# Patient Record
Sex: Female | Born: 1950
Health system: Southern US, Community
[De-identification: ages and names within clinical notes are randomized; demographics above are authoritative.]

## PROBLEM LIST (undated history)

## (undated) ENCOUNTER — Encounter

## (undated) ENCOUNTER — Ambulatory Visit: Payer: MEDICARE

## (undated) ENCOUNTER — Telehealth

## (undated) ENCOUNTER — Ambulatory Visit

## (undated) ENCOUNTER — Encounter: Attending: Hematology & Oncology | Primary: Hematology & Oncology

## (undated) ENCOUNTER — Encounter: Attending: Adult Health | Primary: Adult Health

## (undated) ENCOUNTER — Telehealth: Attending: Hematology & Oncology | Primary: Hematology & Oncology

## (undated) ENCOUNTER — Ambulatory Visit: Payer: MEDICARE | Attending: Nurse Practitioner | Primary: Nurse Practitioner

## (undated) ENCOUNTER — Encounter
Attending: Student in an Organized Health Care Education/Training Program | Primary: Student in an Organized Health Care Education/Training Program

## (undated) ENCOUNTER — Encounter: Attending: Otolaryngology | Primary: Otolaryngology

## (undated) ENCOUNTER — Encounter: Attending: Critical Care Medicine | Primary: Critical Care Medicine

## (undated) ENCOUNTER — Encounter: Attending: Oncology | Primary: Oncology

## (undated) ENCOUNTER — Telehealth: Attending: Registered" | Primary: Registered"

## (undated) ENCOUNTER — Telehealth: Attending: Oncology | Primary: Oncology

## (undated) ENCOUNTER — Ambulatory Visit: Payer: MEDICARE | Attending: Surgery | Primary: Surgery

## (undated) ENCOUNTER — Telehealth: Attending: Internal Medicine | Primary: Internal Medicine

## (undated) ENCOUNTER — Encounter: Attending: Pulmonary Disease | Primary: Pulmonary Disease

## (undated) ENCOUNTER — Telehealth: Attending: Surgery | Primary: Surgery

## (undated) ENCOUNTER — Ambulatory Visit: Payer: MEDICARE | Attending: Adult Health | Primary: Adult Health

## (undated) ENCOUNTER — Ambulatory Visit: Payer: MEDICARE | Attending: Hematology & Oncology | Primary: Hematology & Oncology

## (undated) ENCOUNTER — Encounter: Attending: Nurse Practitioner | Primary: Nurse Practitioner

## (undated) ENCOUNTER — Encounter: Attending: Radiation Oncology | Primary: Radiation Oncology

## (undated) ENCOUNTER — Telehealth: Attending: Nurse Practitioner | Primary: Nurse Practitioner

## (undated) ENCOUNTER — Inpatient Hospital Stay

## (undated) ENCOUNTER — Encounter: Attending: Internal Medicine | Primary: Internal Medicine

## (undated) ENCOUNTER — Telehealth
Attending: Pharmacist Clinician (PhC)/ Clinical Pharmacy Specialist | Primary: Pharmacist Clinician (PhC)/ Clinical Pharmacy Specialist

## (undated) ENCOUNTER — Telehealth: Attending: Adult Health | Primary: Adult Health

## (undated) ENCOUNTER — Institutional Professional Consult (permissible substitution): Payer: MEDICARE

## (undated) ENCOUNTER — Ambulatory Visit: Payer: MEDICARE | Attending: Otolaryngology | Primary: Otolaryngology

## (undated) ENCOUNTER — Ambulatory Visit: Attending: Internal Medicine | Primary: Internal Medicine

## (undated) ENCOUNTER — Ambulatory Visit: Attending: Nurse Practitioner | Primary: Nurse Practitioner

## (undated) DIAGNOSIS — F329 Major depressive disorder, single episode, unspecified: Secondary | ICD-10-CM

## (undated) DIAGNOSIS — G5600 Carpal tunnel syndrome, unspecified upper limb: Secondary | ICD-10-CM

## (undated) DIAGNOSIS — T7840XA Allergy, unspecified, initial encounter: Secondary | ICD-10-CM

## (undated) DIAGNOSIS — F32A Depression, unspecified: Secondary | ICD-10-CM

## (undated) DIAGNOSIS — I10 Essential (primary) hypertension: Secondary | ICD-10-CM

## (undated) DIAGNOSIS — B009 Herpesviral infection, unspecified: Secondary | ICD-10-CM

## (undated) DIAGNOSIS — M1990 Unspecified osteoarthritis, unspecified site: Secondary | ICD-10-CM

## (undated) HISTORY — PX: TONSILLECTOMY: SUR1361

## (undated) HISTORY — DX: Essential (primary) hypertension: I10

## (undated) HISTORY — DX: Depression, unspecified: F32.A

## (undated) HISTORY — DX: Major depressive disorder, single episode, unspecified: F32.9

## (undated) HISTORY — DX: Carpal tunnel syndrome, unspecified upper limb: G56.00

## (undated) HISTORY — DX: Herpesviral infection, unspecified: B00.9

## (undated) HISTORY — DX: Allergy, unspecified, initial encounter: T78.40XA

## (undated) HISTORY — DX: Unspecified osteoarthritis, unspecified site: M19.90

## (undated) MED ORDER — GINSENG ORAL: Freq: Two times a day (BID) | ORAL | 0 days

## (undated) MED ORDER — CYANOCOBALAMIN (VIT B-12) 100 MCG TABLET: Freq: Every day | ORAL | 0 days

## (undated) MED ORDER — LORATADINE 10 MG TABLET: Freq: Every day | ORAL | 0.00000 days

## (undated) MED ORDER — FOLIC ACID 400 MCG TABLET: Freq: Every day | ORAL | 0.00000 days

## (undated) MED ORDER — AMERICAN GINSENG ROOT (BULK) POWDER
ORAL | 0 days
Start: ? — End: 2020-12-16

---
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
## 1990-10-01 HISTORY — PX: TUBAL LIGATION: SHX77

## 1997-10-01 HISTORY — PX: LAPAROSCOPY: SHX197

## 2004-11-17 ENCOUNTER — Ambulatory Visit: Payer: Self-pay | Admitting: Internal Medicine

## 2004-12-07 ENCOUNTER — Ambulatory Visit: Payer: Self-pay | Admitting: Rheumatology

## 2005-01-17 ENCOUNTER — Ambulatory Visit: Payer: Self-pay | Admitting: Internal Medicine

## 2005-01-17 ENCOUNTER — Ambulatory Visit: Payer: Self-pay | Admitting: Rheumatology

## 2005-03-20 ENCOUNTER — Ambulatory Visit: Payer: Self-pay | Admitting: Rheumatology

## 2005-06-26 ENCOUNTER — Ambulatory Visit: Payer: Self-pay | Admitting: Internal Medicine

## 2005-11-14 ENCOUNTER — Ambulatory Visit: Payer: Self-pay | Admitting: Internal Medicine

## 2007-09-04 ENCOUNTER — Ambulatory Visit: Payer: Self-pay | Admitting: Family Medicine

## 2008-10-27 ENCOUNTER — Ambulatory Visit: Payer: Self-pay | Admitting: Unknown Physician Specialty

## 2009-02-02 LAB — HM PAP SMEAR: HM Pap smear: NORMAL

## 2009-05-04 LAB — HM DEXA SCAN

## 2009-07-11 ENCOUNTER — Ambulatory Visit: Payer: Self-pay | Admitting: Nurse Practitioner

## 2009-07-19 ENCOUNTER — Ambulatory Visit: Payer: Self-pay | Admitting: Nurse Practitioner

## 2009-08-08 ENCOUNTER — Ambulatory Visit: Payer: Self-pay | Admitting: Nurse Practitioner

## 2009-10-03 LAB — HM COLONOSCOPY: HM Colonoscopy: NORMAL

## 2010-01-24 ENCOUNTER — Ambulatory Visit: Payer: Self-pay | Admitting: Nurse Practitioner

## 2010-01-26 ENCOUNTER — Ambulatory Visit: Payer: Self-pay | Admitting: Nurse Practitioner

## 2010-06-05 LAB — HM MAMMOGRAPHY: HM Mammogram: NORMAL

## 2010-07-27 ENCOUNTER — Ambulatory Visit: Payer: Self-pay | Admitting: Nurse Practitioner

## 2010-09-05 ENCOUNTER — Ambulatory Visit: Payer: Self-pay | Admitting: Nurse Practitioner

## 2010-09-05 LAB — BASIC METABOLIC PANEL
Glucose: 86 mg/dL
Potassium: 4.1 mmol/L (ref 3.4–5.3)
Sodium: 141 mmol/L (ref 137–147)

## 2010-09-05 LAB — TSH: TSH: 4.24 u[IU]/mL (ref 0.41–5.90)

## 2010-09-05 LAB — HEPATIC FUNCTION PANEL
ALT: 37 U/L — AB (ref 7–35)
AST: 25 U/L (ref 13–35)

## 2010-10-01 HISTORY — PX: NASAL SINUS SURGERY: SHX719

## 2010-11-29 ENCOUNTER — Ambulatory Visit: Payer: Self-pay

## 2010-12-18 ENCOUNTER — Ambulatory Visit: Payer: Self-pay | Admitting: Family Medicine

## 2011-03-05 ENCOUNTER — Ambulatory Visit: Payer: Self-pay | Admitting: Otolaryngology

## 2011-05-24 ENCOUNTER — Encounter: Payer: Self-pay | Admitting: Internal Medicine

## 2011-06-12 ENCOUNTER — Encounter: Payer: Self-pay | Admitting: Internal Medicine

## 2011-07-18 ENCOUNTER — Encounter: Payer: Self-pay | Admitting: Internal Medicine

## 2011-07-18 ENCOUNTER — Ambulatory Visit (INDEPENDENT_AMBULATORY_CARE_PROVIDER_SITE_OTHER): Payer: PRIVATE HEALTH INSURANCE | Admitting: Internal Medicine

## 2011-07-18 ENCOUNTER — Ambulatory Visit: Payer: Self-pay | Admitting: Internal Medicine

## 2011-07-18 VITALS — BP 140/80 | HR 95 | Temp 98.4°F | Resp 16 | Ht 61.5 in | Wt 199.2 lb

## 2011-07-18 DIAGNOSIS — F32A Depression, unspecified: Secondary | ICD-10-CM | POA: Insufficient documentation

## 2011-07-18 DIAGNOSIS — Z0000 Encounter for general adult medical examination without abnormal findings: Secondary | ICD-10-CM

## 2011-07-18 DIAGNOSIS — J309 Allergic rhinitis, unspecified: Secondary | ICD-10-CM | POA: Insufficient documentation

## 2011-07-18 DIAGNOSIS — R5383 Other fatigue: Secondary | ICD-10-CM | POA: Insufficient documentation

## 2011-07-18 DIAGNOSIS — I10 Essential (primary) hypertension: Secondary | ICD-10-CM

## 2011-07-18 DIAGNOSIS — F329 Major depressive disorder, single episode, unspecified: Secondary | ICD-10-CM

## 2011-07-18 MED ORDER — AMLODIPINE BESYLATE 5 MG PO TABS: 5.0000 mg | ORAL_TABLET | Freq: Every day | ORAL | Status: DC

## 2011-07-18 MED ORDER — TRIAMTERENE-HCTZ 37.5-25 MG PO TABS: 1.0000 | ORAL_TABLET | Freq: Every day | ORAL | Status: DC

## 2011-07-18 MED ORDER — BECLOMETHASONE DIPROPIONATE 80 MCG/ACT NA AERS: 80.0000 ug | INHALATION_SPRAY | Freq: Every day | NASAL | Status: DC

## 2011-07-18 NOTE — Progress Notes (Signed)
Subjective:    Patient ID: Stacy Santana, female    DOB: 08/02/1951, 60 y.o.   MRN: [id number]  HPI Stacy Santana is a 60 year old female with a history of hypertension who presents to establish care. Her primary concern today is fatigue. She notes that she works in a stressful job environment. She reports that in order to complete task she has to consume caffeine because of fatigue. She reports that she sleeps well. She reports limited time for exercise. She reports that her mood has been good on Wellbutrin. She reports some increase in constipation and some increasing cold intolerance. She notes that her thyroid lab work was just above the normal range on last check. Repeat lab work is pending. She also notes that she has been checking her blood pressure and it has been running higher typically 140s over 90s. She denies any chest pain, palpitations.  Outpatient Encounter Prescriptions as of 07/18/2011  Medication Sig Dispense Refill  . azelastine (ASTELIN) 137 MCG/SPRAY nasal spray Place 1 spray into the nose at bedtime as needed. Use in each nostril as directed       . bimatoprost (LUMIGAN) 0.03 % ophthalmic solution Place 1 drop into both eyes at bedtime.        [name] buPROPion (WELLBUTRIN XL) 300 MG 24 hr tablet Take 300 mg by mouth daily.        [name] CALCIUM PO Take 1 capsule by mouth daily.        . Cholecalciferol (VITAMIN D PO) Take 1 tablet by mouth daily.        [name] ibandronate (BONIVA) 150 MG tablet Take 150 mg by mouth every 30 (thirty) days. Take in the morning with a full glass of water, on an empty stomach, and do not take anything else by mouth or lie down for the next 30 min.       [name] loratadine (CLARITIN) 10 MG tablet Take 10 mg by mouth daily.        . Naproxen Sodium 220 MG CAPS Take 2 capsules by mouth daily.        . Omega-3 Fatty Acids (FISH OIL PO) Take 1 capsule by mouth daily.        [name] triamterene-hydrochlorothiazide (MAXZIDE-25) 37.5-25 MG per tablet Take 1 each (1 tablet total) by  mouth daily.  90 tablet  4    Review of Systems  Constitutional: Positive for fatigue. Negative for fever, chills, appetite change and unexpected weight change.  HENT: Negative for ear pain, congestion, sore throat, trouble swallowing, neck pain, voice change and sinus pressure.   Eyes: Negative for visual disturbance.  Respiratory: Negative for cough, shortness of breath, wheezing and stridor.   Cardiovascular: Negative for chest pain, palpitations and leg swelling.  Gastrointestinal: Negative for nausea, vomiting, abdominal pain, diarrhea, constipation, blood in stool, abdominal distention and anal bleeding.  Genitourinary: Negative for dysuria and flank pain.  Musculoskeletal: Negative for myalgias, arthralgias and gait problem.  Skin: Negative for color change and rash.  Neurological: Negative for dizziness and headaches.  Hematological: Negative for adenopathy. Does not bruise/bleed easily.  Psychiatric/Behavioral: Negative for suicidal ideas, sleep disturbance and dysphoric mood. The patient is not nervous/anxious.    BP 140/80  Pulse 95  Temp(Src) 98.4 F (36.9 C) (Oral)  Resp 16  Ht 5' 1.5" (1.562 m)  Wt 199 lb 4 oz (90.379 kg)  BMI 37.04 kg/m2  SpO2 98% BP check by nurse 176/90    Objective:   Physical Exam  Constitutional: She  is oriented to person, place, and time. She appears well-developed and well-nourished. No distress.  HENT:  Head: Normocephalic and atraumatic.  Right Ear: External ear normal.  Left Ear: External ear normal.  Nose: Nose normal.  Mouth/Throat: Oropharynx is clear and moist. No oropharyngeal exudate.  Eyes: Conjunctivae are normal. Pupils are equal, round, and reactive to light. Right eye exhibits no discharge. Left eye exhibits no discharge. No scleral icterus.  Neck: Normal range of motion. Neck supple. No tracheal deviation present. No thyromegaly present.  Cardiovascular: Normal rate, regular rhythm, normal heart sounds and intact distal  pulses.  Exam reveals no gallop and no friction rub.   No murmur heard. Pulmonary/Chest: Effort normal and breath sounds normal. No respiratory distress. She has no wheezes. She has no rales. She exhibits no tenderness.  Musculoskeletal: Normal range of motion. She exhibits no edema and no tenderness.  Lymphadenopathy:    She has no cervical adenopathy.  Neurological: She is alert and oriented to person, place, and time. No cranial nerve deficit. She exhibits normal muscle tone. Coordination normal.  Skin: Skin is warm and dry. No rash noted. She is not diaphoretic. No erythema. No pallor.  Psychiatric: She has a normal mood and affect. Her behavior is normal. Judgment and thought content normal.          Assessment & Plan:  1. Fatigue -suspect this may be related to thyroid dysfunction. Review of her lab work from last year showed a borderline elevated TSH. Repeat TSH is pending and was repeated earlier this month. We will request results on this. Will treat as appropriate. I will call her once results available. We'll also check B12 level. She will followup in one month.  2. Hypertension -blood pressure elevated today. Will add amlodipine. Will check renal function with recent labs. She will return to clinic in one month.  3. Allergic rhinitis -patient reports improvement on new nasal steroid. Refill given today. We'll request records on recent sinus surgery.  4. Depression - well controlled on wellbutrin. Will continue.

## 2011-08-08 ENCOUNTER — Encounter: Payer: Self-pay | Admitting: Internal Medicine

## 2011-08-20 ENCOUNTER — Encounter: Payer: PRIVATE HEALTH INSURANCE | Admitting: Internal Medicine

## 2011-08-21 ENCOUNTER — Ambulatory Visit (INDEPENDENT_AMBULATORY_CARE_PROVIDER_SITE_OTHER): Payer: PRIVATE HEALTH INSURANCE | Admitting: Internal Medicine

## 2011-08-21 ENCOUNTER — Encounter: Payer: Self-pay | Admitting: Internal Medicine

## 2011-08-21 DIAGNOSIS — Z0000 Encounter for general adult medical examination without abnormal findings: Secondary | ICD-10-CM

## 2011-08-21 DIAGNOSIS — E785 Hyperlipidemia, unspecified: Secondary | ICD-10-CM

## 2011-08-21 DIAGNOSIS — J309 Allergic rhinitis, unspecified: Secondary | ICD-10-CM

## 2011-08-21 DIAGNOSIS — M542 Cervicalgia: Secondary | ICD-10-CM

## 2011-08-21 DIAGNOSIS — E039 Hypothyroidism, unspecified: Secondary | ICD-10-CM

## 2011-08-21 MED ORDER — LEVOTHYROXINE SODIUM 50 MCG PO TABS: 50.0000 ug | ORAL_TABLET | Freq: Every day | ORAL | Status: DC

## 2011-08-21 MED ORDER — CICLESONIDE 50 MCG/ACT NA SUSP: 2.0000 | Freq: Every day | NASAL | Status: DC

## 2011-08-21 NOTE — Progress Notes (Signed)
Subjective:    Patient ID: Stacy Santana, female    DOB: Jul 06, 1951, 60 y.o.   MRN: [id number]  HPI 60 year old female with a history of hypertension presents for her annual exam. She continues to complain of fatigue. She continues to report drinking excessive amounts of caffeine to help combat fatigue. We reviewed lab work from hospital screening today which showed elevated TSH at 5.2. Aside from her fatigue, she reports that she has been feeling well. She reports that she has been exercising with her husband riding bikes. She continues to work long hours and has considerable stress at work.  She reports that blood pressure has been improved since starting Norvasc. She did not bring a record of her blood pressures today. She denies any chest pain, palpitations, headache.  She notes some increased muscle cramping and pain in her upper back. She associates this with stress at work. She has taken over-the-counter pain medicines with minimal improvement.  Outpatient Encounter Prescriptions as of 08/21/2011  Medication Sig Dispense Refill  . amLODipine (NORVASC) 5 MG tablet Take 1 tablet (5 mg total) by mouth daily.  90 tablet  4  . azelastine (ASTELIN) 137 MCG/SPRAY nasal spray Place 1 spray into the nose at bedtime as needed. Use in each nostril as directed       . bimatoprost (LUMIGAN) 0.03 % ophthalmic solution Place 1 drop into both eyes at bedtime.        [name] buPROPion (WELLBUTRIN XL) 300 MG 24 hr tablet Take 300 mg by mouth daily.        [name] CALCIUM PO Take 1,200 mg by mouth daily.       . Cholecalciferol (VITAMIN D PO) Take 1 tablet by mouth daily.        [name] ibandronate (BONIVA) 150 MG tablet Take 150 mg by mouth every 30 (thirty) days. Take in the morning with a full glass of water, on an empty stomach, and do not take anything else by mouth or lie down for the next 30 min.       [name] loratadine (CLARITIN) 10 MG tablet Take 10 mg by mouth daily.        . Naproxen Sodium 220 MG CAPS Take 2 capsules  by mouth daily.        . Omega-3 Fatty Acids (FISH OIL PO) Take 1 capsule by mouth daily.        [name] triamterene-hydrochlorothiazide (MAXZIDE-25) 37.5-25 MG per tablet Take 1 each (1 tablet total) by mouth daily.  90 tablet  4  . DISCONTD: Beclomethasone Dipropionate 80 MCG/ACT AERS Place 80 mcg into the nose daily.  1 Inhaler  6  . ciclesonide (OMNARIS) 50 MCG/ACT nasal spray Place 2 sprays into both nostrils daily.  12.5 g  0  . levothyroxine (SYNTHROID) 50 MCG tablet Take 1 tablet (50 mcg total) by mouth daily.  30 tablet  3    Review of Systems  Constitutional: Positive for fatigue. Negative for fever, chills, appetite change and unexpected weight change.  HENT: Negative for ear pain, congestion, sore throat, trouble swallowing, neck pain, voice change and sinus pressure.   Eyes: Negative for visual disturbance.  Respiratory: Negative for cough, shortness of breath, wheezing and stridor.   Cardiovascular: Negative for chest pain, palpitations and leg swelling.  Gastrointestinal: Negative for nausea, vomiting, abdominal pain, diarrhea, constipation, blood in stool, abdominal distention and anal bleeding.  Genitourinary: Negative for dysuria and flank pain.  Musculoskeletal: Positive for myalgias (upper back pain). Negative for arthralgias and  gait problem.  Skin: Negative for color change and rash.  Neurological: Negative for dizziness and headaches.  Hematological: Negative for adenopathy. Does not bruise/bleed easily.  Psychiatric/Behavioral: Negative for suicidal ideas, sleep disturbance and dysphoric mood. The patient is not nervous/anxious.    BP 118/78  Pulse 94  Temp(Src) 98.4 F (36.9 C) (Oral)  Resp 14  Ht 5\' 2"  (1.575 m)  Wt 200 lb 8 oz (90.946 kg)  BMI 36.67 kg/m2  SpO2 98%     Objective:   Physical Exam  Constitutional: She is oriented to person, place, and time. She appears well-developed and well-nourished. No distress.  HENT:  Head: Normocephalic and atraumatic.    Right Ear: External ear normal.  Left Ear: External ear normal.  Nose: Nose normal.  Mouth/Throat: Oropharynx is clear and moist. No oropharyngeal exudate.  Eyes: Conjunctivae are normal. Pupils are equal, round, and reactive to light. Right eye exhibits no discharge. Left eye exhibits no discharge. No scleral icterus.  Neck: Normal range of motion. Neck supple. No tracheal deviation present. No thyromegaly present.  Cardiovascular: Normal rate, regular rhythm, normal heart sounds and intact distal pulses.  Exam reveals no gallop and no friction rub.   No murmur heard. Pulmonary/Chest: Effort normal and breath sounds normal. No respiratory distress. She has no wheezes. She has no rales. She exhibits no tenderness. Right breast exhibits no inverted nipple, no mass, no nipple discharge, no skin change and no tenderness. Left breast exhibits no inverted nipple, no mass, no nipple discharge, no skin change and no tenderness. Breasts are symmetrical.  Abdominal: Soft. Bowel sounds are normal. She exhibits no distension and no mass. There is no tenderness. There is no rebound and no guarding.  Musculoskeletal: Normal range of motion. She exhibits no edema and no tenderness.  Lymphadenopathy:    She has no cervical adenopathy.  Neurological: She is alert and oriented to person, place, and time. No cranial nerve deficit. She exhibits normal muscle tone. Coordination normal.  Skin: Skin is warm and dry. No rash noted. She is not diaphoretic. No erythema. No pallor.  Psychiatric: She has a normal mood and affect. Her behavior is normal. Judgment and thought content normal.          Assessment & Plan:  1. General medical exam - Exam normal. Mammogram ordered. Labs reviewed including kidney function, lipids, thyroid function (from hospital screening). Will repeat lipids, as they were elevated, in 1 month. Encouraged continued efforts at diet and exercise.  PAP deferred this year as reportedly normal  last year. Will request record on this.  2. Hypothyroidism -symptoms including fatigue and constipation and lab values including elevated TSH are consistent with hypothyroidism. Will start Synthroid 50 mcg daily. Patient was instructed to take this medication 30 minutes prior to breakfast with water. She will have a repeat TSH in one month. She will followup in 6 weeks.  3. Cervicalgia - patient with mild upper back pain and cramping of the trapezius muscle. Recommended massage therapy. Prescription given today.  4. Hypertension - BP improved with addition of norvasc. Will continue. Recent renal function normal. Repeat renal function with labs 09/2011.

## 2011-08-22 ENCOUNTER — Ambulatory Visit: Payer: Self-pay | Admitting: Internal Medicine

## 2011-08-28 ENCOUNTER — Encounter: Payer: Self-pay | Admitting: Internal Medicine

## 2011-09-06 ENCOUNTER — Encounter: Payer: Self-pay | Admitting: Internal Medicine

## 2011-09-06 DIAGNOSIS — E7800 Pure hypercholesterolemia, unspecified: Secondary | ICD-10-CM

## 2011-09-20 ENCOUNTER — Other Ambulatory Visit (INDEPENDENT_AMBULATORY_CARE_PROVIDER_SITE_OTHER): Payer: PRIVATE HEALTH INSURANCE | Admitting: *Deleted

## 2011-09-20 DIAGNOSIS — E785 Hyperlipidemia, unspecified: Secondary | ICD-10-CM

## 2011-09-20 DIAGNOSIS — E039 Hypothyroidism, unspecified: Secondary | ICD-10-CM

## 2011-09-20 LAB — COMPREHENSIVE METABOLIC PANEL
ALT: 49 U/L — ABNORMAL HIGH (ref 0–35)
Alkaline Phosphatase: 81 U/L (ref 39–117)
Sodium: 140 mEq/L (ref 135–145)
Total Bilirubin: 0.5 mg/dL (ref 0.3–1.2)
Total Protein: 7.1 g/dL (ref 6.0–8.3)

## 2011-09-20 LAB — LIPID PANEL
HDL: 45.8 mg/dL (ref >39.00)
LDL Cholesterol: 86 mg/dL (ref 0–99)
Total CHOL/HDL Ratio: 4
VLDL: 33.6 mg/dL (ref 0.0–40.0)

## 2011-09-28 ENCOUNTER — Ambulatory Visit: Payer: PRIVATE HEALTH INSURANCE | Admitting: Internal Medicine

## 2011-10-09 ENCOUNTER — Ambulatory Visit: Payer: PRIVATE HEALTH INSURANCE | Admitting: Internal Medicine

## 2011-10-15 ENCOUNTER — Ambulatory Visit (INDEPENDENT_AMBULATORY_CARE_PROVIDER_SITE_OTHER): Payer: PRIVATE HEALTH INSURANCE | Admitting: Internal Medicine

## 2011-10-15 ENCOUNTER — Encounter: Payer: Self-pay | Admitting: Internal Medicine

## 2011-10-15 DIAGNOSIS — E039 Hypothyroidism, unspecified: Secondary | ICD-10-CM

## 2011-10-15 NOTE — Progress Notes (Signed)
Subjective:    Patient ID: Stacy Santana, female    DOB: [birth year], 61 y.o.   MRN: [id number]  HPI 61 year old female with history of hypothyroidism presents for followup. She reports that since starting on thyroid medication her symptoms of fatigue have improved. She reports full compliance with her medication. She denies any hot or cold intolerance, ongoing fatigue, constipation, or other symptoms. She reports that she is feeling well and does not have any other complaints today.  Outpatient Encounter Prescriptions as of 10/15/2011  Medication Sig Dispense Refill  . amLODipine (NORVASC) 5 MG tablet Take 1 tablet (5 mg total) by mouth daily.  90 tablet  4  . azelastine (ASTELIN) 137 MCG/SPRAY nasal spray Place 1 spray into the nose at bedtime as needed. Use in each nostril as directed       . bimatoprost (LUMIGAN) 0.03 % ophthalmic solution Place 1 drop into both eyes at bedtime.        [name] buPROPion (WELLBUTRIN XL) 300 MG 24 hr tablet Take 300 mg by mouth daily.        [name] CALCIUM PO Take 1,200 mg by mouth daily.       . Cholecalciferol (VITAMIN D PO) Take 1 tablet by mouth daily.        . ciclesonide (OMNARIS) 50 MCG/ACT nasal spray Place 2 sprays into both nostrils daily.  12.5 g  0  . ibandronate (BONIVA) 150 MG tablet Take 150 mg by mouth every 30 (thirty) days. Take in the morning with a full glass of water, on an empty stomach, and do not take anything else by mouth or lie down for the next 30 min.       [name] levothyroxine (SYNTHROID) 50 MCG tablet Take 1 tablet (50 mcg total) by mouth daily.  30 tablet  3  . loratadine (CLARITIN) 10 MG tablet Take 10 mg by mouth daily.        . Naproxen Sodium 220 MG CAPS Take 2 capsules by mouth daily.        . Omega-3 Fatty Acids (FISH OIL PO) Take 1 capsule by mouth daily.        [name] triamterene-hydrochlorothiazide (MAXZIDE-25) 37.5-25 MG per tablet Take 1 each (1 tablet total) by mouth daily.  90 tablet  4    Review of Systems  Constitutional: Negative  for fever, chills, appetite change, fatigue and unexpected weight change.  HENT: Negative for ear pain, congestion, sore throat, trouble swallowing, neck pain, voice change and sinus pressure.   Eyes: Negative for visual disturbance.  Respiratory: Negative for cough, shortness of breath, wheezing and stridor.   Cardiovascular: Negative for chest pain, palpitations and leg swelling.  Gastrointestinal: Negative for nausea, vomiting, abdominal pain, diarrhea, constipation, blood in stool, abdominal distention and anal bleeding.  Genitourinary: Negative for dysuria and flank pain.  Musculoskeletal: Negative for myalgias, arthralgias and gait problem.  Skin: Negative for color change and rash.  Neurological: Negative for dizziness and headaches.  Hematological: Negative for adenopathy. Does not bruise/bleed easily.  Psychiatric/Behavioral: Negative for suicidal ideas, sleep disturbance and dysphoric mood. The patient is not nervous/anxious.    BP 132/60  Pulse 102  Temp(Src) 98.3 F (36.8 C) (Oral)  Wt 198 lb (89.812 kg)  SpO2 96%     Objective:   Physical Exam  Constitutional: She is oriented to person, place, and time. She appears well-developed and well-nourished. No distress.  HENT:  Head: Normocephalic and atraumatic.  Right Ear: External ear normal.  Left Ear: External  ear normal.  Nose: Nose normal.  Mouth/Throat: Oropharynx is clear and moist. No oropharyngeal exudate.  Eyes: Conjunctivae are normal. Pupils are equal, round, and reactive to light. Right eye exhibits no discharge. Left eye exhibits no discharge. No scleral icterus.  Neck: Normal range of motion. Neck supple. No tracheal deviation present. No thyromegaly present.  Cardiovascular: Normal rate, regular rhythm, normal heart sounds and intact distal pulses.  Exam reveals no gallop and no friction rub.   No murmur heard. Pulmonary/Chest: Effort normal and breath sounds normal. No respiratory distress. She has no  wheezes. She has no rales. She exhibits no tenderness.  Abdominal: Soft. Bowel sounds are normal. She exhibits no distension and no mass. There is no hepatosplenomegaly. There is no tenderness. There is no rebound and no guarding.  Musculoskeletal: Normal range of motion. She exhibits no edema and no tenderness.  Lymphadenopathy:    She has no cervical adenopathy.  Neurological: She is alert and oriented to person, place, and time. No cranial nerve deficit. She exhibits normal muscle tone. Coordination normal.  Skin: Skin is warm and dry. No rash noted. She is not diaphoretic. No erythema. No pallor.  Psychiatric: She has a normal mood and affect. Her behavior is normal. Judgment and thought content normal.          Assessment & Plan:  1. Hypothyroidism - TSH now normal. Symptoms improved. Will continue synthroid at current dose. Follow up TSH in 3 months with labs.  2. Elevated LFTs - Mild elevation of ALT. Question steatohepatitis given mild elevation of triglycerides. Will repeat with labs in 3 months. If persistently elevated, will get liver US.

## 2011-10-16 ENCOUNTER — Other Ambulatory Visit: Payer: Self-pay | Admitting: *Deleted

## 2011-10-16 ENCOUNTER — Encounter: Payer: Self-pay | Admitting: Internal Medicine

## 2011-10-16 DIAGNOSIS — E039 Hypothyroidism, unspecified: Secondary | ICD-10-CM

## 2011-10-16 MED ORDER — LEVOTHYROXINE SODIUM 50 MCG PO TABS: 50.0000 ug | ORAL_TABLET | Freq: Every day | ORAL | Status: DC

## 2011-10-24 ENCOUNTER — Encounter: Payer: Self-pay | Admitting: Internal Medicine

## 2011-12-12 ENCOUNTER — Encounter: Payer: Self-pay | Admitting: Internal Medicine

## 2011-12-12 DIAGNOSIS — M1990 Unspecified osteoarthritis, unspecified site: Secondary | ICD-10-CM | POA: Insufficient documentation

## 2011-12-12 DIAGNOSIS — N809 Endometriosis, unspecified: Secondary | ICD-10-CM | POA: Insufficient documentation

## 2011-12-12 DIAGNOSIS — G5600 Carpal tunnel syndrome, unspecified upper limb: Secondary | ICD-10-CM | POA: Insufficient documentation

## 2011-12-12 DIAGNOSIS — E7800 Pure hypercholesterolemia, unspecified: Secondary | ICD-10-CM | POA: Insufficient documentation

## 2011-12-12 DIAGNOSIS — T7840XA Allergy, unspecified, initial encounter: Secondary | ICD-10-CM | POA: Insufficient documentation

## 2011-12-17 ENCOUNTER — Ambulatory Visit: Payer: Self-pay

## 2012-01-09 ENCOUNTER — Encounter: Payer: Self-pay | Admitting: Internal Medicine

## 2012-01-10 ENCOUNTER — Telehealth: Payer: Self-pay | Admitting: *Deleted

## 2012-01-10 NOTE — Telephone Encounter (Signed)
Opened in error

## 2012-01-14 ENCOUNTER — Ambulatory Visit: Payer: PRIVATE HEALTH INSURANCE | Admitting: Internal Medicine

## 2012-01-15 ENCOUNTER — Ambulatory Visit (INDEPENDENT_AMBULATORY_CARE_PROVIDER_SITE_OTHER): Payer: PRIVATE HEALTH INSURANCE | Admitting: Internal Medicine

## 2012-01-15 ENCOUNTER — Encounter: Payer: Self-pay | Admitting: Internal Medicine

## 2012-01-15 VITALS — BP 128/62 | HR 105 | Temp 98.5°F | Wt 193.0 lb

## 2012-01-15 DIAGNOSIS — R945 Abnormal results of liver function studies: Secondary | ICD-10-CM | POA: Insufficient documentation

## 2012-01-15 DIAGNOSIS — E039 Hypothyroidism, unspecified: Secondary | ICD-10-CM

## 2012-01-15 DIAGNOSIS — R7989 Other specified abnormal findings of blood chemistry: Secondary | ICD-10-CM

## 2012-01-15 DIAGNOSIS — F32A Depression, unspecified: Secondary | ICD-10-CM

## 2012-01-15 DIAGNOSIS — F329 Major depressive disorder, single episode, unspecified: Secondary | ICD-10-CM

## 2012-01-15 DIAGNOSIS — J309 Allergic rhinitis, unspecified: Secondary | ICD-10-CM

## 2012-01-15 DIAGNOSIS — I10 Essential (primary) hypertension: Secondary | ICD-10-CM

## 2012-01-15 LAB — COMPREHENSIVE METABOLIC PANEL
AST: 30 U/L (ref 0–37)
Albumin: 4.1 g/dL (ref 3.5–5.2)
Alkaline Phosphatase: 65 U/L (ref 39–117)
BUN: 14 mg/dL (ref 6–23)
Creatinine, Ser: 0.9 mg/dL (ref 0.4–1.2)
Glucose, Bld: 121 mg/dL — ABNORMAL HIGH (ref 70–99)
Potassium: 3.3 mEq/L — ABNORMAL LOW (ref 3.5–5.1)

## 2012-01-15 MED ORDER — LEVOTHYROXINE SODIUM 50 MCG PO TABS: 50.0000 ug | ORAL_TABLET | Freq: Every day | ORAL | Status: DC

## 2012-01-15 NOTE — Assessment & Plan Note (Signed)
Symptoms recently well-controlled despite increased stress related to work. We'll plan to continue Wellbutrin. We discussed potentially adding an additional medication, but will defer for now.

## 2012-01-15 NOTE — Assessment & Plan Note (Signed)
Likely secondary to fatty infiltration. Will repeat LFTs with labs today.

## 2012-01-15 NOTE — Assessment & Plan Note (Signed)
Will check TSH with labs today. Continue Synthroid. 

## 2012-01-15 NOTE — Assessment & Plan Note (Signed)
Symptoms are well-controlled with Claritin and on nares. Will continue.

## 2012-01-15 NOTE — Progress Notes (Signed)
Subjective:    Patient ID: Stacy Santana, female    DOB: August 28, 1951, 61 y.o.   MRN: [id number]  HPI  61 year old female with history of hypertension, hypothyroidism, anxiety, and seasonal allergies presents for followup. In regards to her anxiety, she notes that the last 6 months have been particularly difficult. She has recently reduced her work hours to help with excessive stress. She feels that her medication, but Wellbutrin is helpful. However, she continues to experience some symptoms of anxious mood and fatigue.  Regards to hypertension and hypothyroidism, she reports full compliance with her medications. She has recently been trying to increase her exercise in an effort to improve her health.  Regards to her seasonal allergies, she reports recent episode of sinusitis which was treated with antibiotics. She reports poor control of her symptoms without the use of Claritin and nasal steroid, which she recently resumed.  Outpatient Encounter Prescriptions as of 01/15/2012  Medication Sig Dispense Refill  . amLODipine (NORVASC) 5 MG tablet Take 1 tablet (5 mg total) by mouth daily.  90 tablet  4  . azelastine (ASTELIN) 137 MCG/SPRAY nasal spray Place 1 spray into the nose at bedtime as needed. Use in each nostril as directed       . bimatoprost (LUMIGAN) 0.03 % ophthalmic solution Place 1 drop into both eyes at bedtime.        [name] buPROPion (WELLBUTRIN XL) 300 MG 24 hr tablet Take 300 mg by mouth daily.        [name] CALCIUM PO Take 1,200 mg by mouth daily.       . Cholecalciferol (VITAMIN D PO) Take 1 tablet by mouth daily.        . ciclesonide (OMNARIS) 50 MCG/ACT nasal spray Place 2 sprays into both nostrils daily.  12.5 g  0  . ibandronate (BONIVA) 150 MG tablet Take 150 mg by mouth every 30 (thirty) days. Take in the morning with a full glass of water, on an empty stomach, and do not take anything else by mouth or lie down for the next 30 min.       [name] levothyroxine (SYNTHROID) 50 MCG tablet Take  1 tablet (50 mcg total) by mouth daily.  90 tablet  1  . loratadine (CLARITIN) 10 MG tablet Take 10 mg by mouth daily.        . Naproxen Sodium 220 MG CAPS Take 2 capsules by mouth daily.        . Omega-3 Fatty Acids (FISH OIL PO) Take 1 capsule by mouth daily.        [name] triamterene-hydrochlorothiazide (MAXZIDE-25) 37.5-25 MG per tablet Take 1 each (1 tablet total) by mouth daily.  90 tablet  4     Review of Systems  Constitutional: Negative for fever, chills, appetite change, fatigue and unexpected weight change.  HENT: Negative for ear pain, congestion, sore throat, trouble swallowing, neck pain, voice change and sinus pressure.   Eyes: Negative for visual disturbance.  Respiratory: Negative for cough, shortness of breath, wheezing and stridor.   Cardiovascular: Negative for chest pain, palpitations and leg swelling.  Gastrointestinal: Negative for nausea, vomiting, abdominal pain, diarrhea, constipation, blood in stool, abdominal distention and anal bleeding.  Genitourinary: Negative for dysuria and flank pain.  Musculoskeletal: Negative for myalgias, arthralgias and gait problem.  Skin: Negative for color change and rash.  Neurological: Negative for dizziness and headaches.  Hematological: Negative for adenopathy. Does not bruise/bleed easily.  Psychiatric/Behavioral: Positive for dysphoric mood. Negative for suicidal ideas and  sleep disturbance. The patient is nervous/anxious.    BP 128/62  Pulse 105  Temp(Src) 98.5 F (36.9 C) (Oral)  Wt 193 lb (87.544 kg)  SpO2 97%     Objective:   Physical Exam  Constitutional: She is oriented to person, place, and time. She appears well-developed and well-nourished. No distress.  HENT:  Head: Normocephalic and atraumatic.  Right Ear: External ear normal.  Left Ear: External ear normal.  Nose: Nose normal.  Mouth/Throat: Oropharynx is clear and moist. No oropharyngeal exudate.  Eyes: Conjunctivae are normal. Pupils are equal, round, and  reactive to light. Right eye exhibits no discharge. Left eye exhibits no discharge. No scleral icterus.  Neck: Normal range of motion. Neck supple. No tracheal deviation present. No thyromegaly present.  Cardiovascular: Normal rate, regular rhythm, normal heart sounds and intact distal pulses.  Exam reveals no gallop and no friction rub.   No murmur heard. Pulmonary/Chest: Effort normal and breath sounds normal. No respiratory distress. She has no wheezes. She has no rales. She exhibits no tenderness.  Abdominal: Soft. Bowel sounds are normal. She exhibits no distension and no mass. There is no tenderness. There is no rebound and no guarding.  Musculoskeletal: Normal range of motion. She exhibits no edema and no tenderness.  Lymphadenopathy:    She has no cervical adenopathy.  Neurological: She is alert and oriented to person, place, and time. No cranial nerve deficit. She exhibits normal muscle tone. Coordination normal.  Skin: Skin is warm and dry. No rash noted. She is not diaphoretic. No erythema. No pallor.  Psychiatric: She has a normal mood and affect. Her behavior is normal. Judgment and thought content normal.          Assessment & Plan:

## 2012-01-15 NOTE — Assessment & Plan Note (Signed)
Blood pressure is well-controlled today. We'll continue current medications. We'll check renal function with labs.

## 2012-01-24 ENCOUNTER — Ambulatory Visit: Payer: Self-pay | Admitting: General Practice

## 2012-02-11 ENCOUNTER — Encounter: Payer: Self-pay | Admitting: Internal Medicine

## 2012-02-11 ENCOUNTER — Other Ambulatory Visit: Payer: Self-pay | Admitting: *Deleted

## 2012-02-11 MED ORDER — BUPROPION HCL ER (XL) 300 MG PO TB24: 300.0000 mg | ORAL_TABLET | Freq: Every day | ORAL | Status: DC

## 2012-02-11 NOTE — Telephone Encounter (Signed)
Bupropion request [last OV 04.16.13, last refill per [id number] [phone]]. Please advise.

## 2012-02-11 NOTE — Telephone Encounter (Signed)
Done

## 2012-02-11 NOTE — Telephone Encounter (Signed)
Fine to fill. 

## 2012-05-21 ENCOUNTER — Other Ambulatory Visit: Payer: Self-pay | Admitting: *Deleted

## 2012-05-21 MED ORDER — BUPROPION HCL ER (XL) 300 MG PO TB24: 300.0000 mg | ORAL_TABLET | Freq: Every day | ORAL | Status: DC

## 2012-07-09 ENCOUNTER — Encounter: Payer: Self-pay | Admitting: Internal Medicine

## 2012-07-16 ENCOUNTER — Encounter: Payer: Self-pay | Admitting: Internal Medicine

## 2012-07-16 ENCOUNTER — Ambulatory Visit (INDEPENDENT_AMBULATORY_CARE_PROVIDER_SITE_OTHER): Payer: PRIVATE HEALTH INSURANCE | Admitting: Internal Medicine

## 2012-07-16 VITALS — BP 150/84 | HR 98 | Temp 98.2°F | Ht 62.0 in | Wt 196.5 lb

## 2012-07-16 DIAGNOSIS — K219 Gastro-esophageal reflux disease without esophagitis: Secondary | ICD-10-CM | POA: Insufficient documentation

## 2012-07-16 DIAGNOSIS — E039 Hypothyroidism, unspecified: Secondary | ICD-10-CM

## 2012-07-16 DIAGNOSIS — F419 Anxiety disorder, unspecified: Secondary | ICD-10-CM

## 2012-07-16 DIAGNOSIS — I10 Essential (primary) hypertension: Secondary | ICD-10-CM

## 2012-07-16 DIAGNOSIS — Z1239 Encounter for other screening for malignant neoplasm of breast: Secondary | ICD-10-CM | POA: Insufficient documentation

## 2012-07-16 DIAGNOSIS — F341 Dysthymic disorder: Secondary | ICD-10-CM

## 2012-07-16 MED ORDER — BUPROPION HCL ER (XL) 150 MG PO TB24: 150.0000 mg | ORAL_TABLET | Freq: Every day | ORAL | Status: DC

## 2012-07-16 MED ORDER — PANTOPRAZOLE SODIUM 40 MG PO TBEC: 40.0000 mg | DELAYED_RELEASE_TABLET | Freq: Every day | ORAL | Status: DC

## 2012-07-16 MED ORDER — AZELASTINE HCL 0.1 % NA SOLN: 1.0000 | Freq: Every evening | NASAL | Status: DC | PRN

## 2012-07-16 MED ORDER — AMLODIPINE BESYLATE 5 MG PO TABS: 5.0000 mg | ORAL_TABLET | Freq: Every day | ORAL | Status: DC

## 2012-07-16 MED ORDER — LEVOTHYROXINE SODIUM 50 MCG PO TABS: 50.0000 ug | ORAL_TABLET | Freq: Every day | ORAL | Status: DC

## 2012-07-16 NOTE — Assessment & Plan Note (Signed)
Symptomatically doing well. Will check TSH with labs today. Continue Synthroid. Follow up 6 months or sooner as needed.

## 2012-07-16 NOTE — Assessment & Plan Note (Signed)
Patient uses chronic Naprosyn to help with arthritis pain. We discussed adding PPI to help prevent NSAID induced gastritis. Will add protonix.Follow up prn.

## 2012-07-16 NOTE — Assessment & Plan Note (Signed)
Blood pressure slightly elevated today but patient has not yet taken medications. Per report, blood pressure is well-controlled at home. We'll continue current medications. Will check renal function with labs today. Follow up 6 months or sooner as needed.

## 2012-07-16 NOTE — Progress Notes (Signed)
Subjective:    Patient ID: Stacy Santana, female    DOB: 1951-08-29, 61 y.o.   MRN: [id number]  HPI 61 year old female with history of hypertension, hypothyroidism, chronic arthralgia presents for followup. She reports she is generally doing well. She has limited her work schedule to 3 days per week. She feels that her anxiety her stress level has markedly improved. She would like to taper down on Wellbutrin. She reports that blood pressure has been well-controlled, typically less than 120/80. She denies any noted side effects from her medications.  She is concerned today about the potential of gastritis or ulcer given that she uses Naprosyn on a daily basis to help with arthritis pain. She does not currently take any acid blocking medication. She denies any current abdominal pain, nausea, change in bowel habits or appetite.  Outpatient Encounter Prescriptions as of 07/16/2012  Medication Sig Dispense Refill  . amLODipine (NORVASC) 5 MG tablet Take 1 tablet (5 mg total) by mouth daily.  90 tablet  3  . azelastine (ASTELIN) 137 MCG/SPRAY nasal spray Place 1 spray into the nose at bedtime as needed. Use in each nostril as directed  30 mL  3  . bimatoprost (LUMIGAN) 0.03 % ophthalmic solution Place 1 drop into both eyes at bedtime.        [name] buPROPion (WELLBUTRIN XL) 150 MG 24 hr tablet Take 1 tablet (150 mg total) by mouth daily.  30 tablet  3  . CALCIUM PO Take 1,200 mg by mouth daily.       . Cholecalciferol (VITAMIN D PO) Take 1 tablet by mouth daily.        . ciclesonide (OMNARIS) 50 MCG/ACT nasal spray Place 2 sprays into both nostrils daily.  12.5 g  0  . ibandronate (BONIVA) 150 MG tablet Take 150 mg by mouth every 30 (thirty) days. Take in the morning with a full glass of water, on an empty stomach, and do not take anything else by mouth or lie down for the next 30 min.       [name] levothyroxine (SYNTHROID) 50 MCG tablet Take 1 tablet (50 mcg total) by mouth daily.  90 tablet  3  . loratadine  (CLARITIN) 10 MG tablet Take 10 mg by mouth daily.        . Naproxen Sodium 220 MG CAPS Take 2 capsules by mouth daily.        . Omega-3 Fatty Acids (FISH OIL PO) Take 1 capsule by mouth daily.        [name] triamterene-hydrochlorothiazide (MAXZIDE-25) 37.5-25 MG per tablet Take 1 each (1 tablet total) by mouth daily.  90 tablet  4  . pantoprazole (PROTONIX) 40 MG tablet Take 1 tablet (40 mg total) by mouth daily.  90 tablet  3   BP 150/84  Pulse 98  Temp 98.2 F (36.8 C) (Oral)  Ht 5\' 2"  (1.575 m)  Wt 196 lb 8 oz (89.132 kg)  BMI 35.94 kg/m2  SpO2 98%  Review of Systems  Constitutional: Negative for fever, chills, appetite change, fatigue and unexpected weight change.  HENT: Negative for ear pain, congestion, sore throat, trouble swallowing, neck pain, voice change and sinus pressure.   Eyes: Negative for visual disturbance.  Respiratory: Negative for cough, shortness of breath, wheezing and stridor.   Cardiovascular: Negative for chest pain, palpitations and leg swelling.  Gastrointestinal: Negative for nausea, vomiting, abdominal pain, diarrhea, constipation, blood in stool, abdominal distention and anal bleeding.  Genitourinary: Negative for dysuria and flank pain.  Musculoskeletal: Positive for arthralgias. Negative for myalgias and gait problem.  Skin: Negative for color change and rash.  Neurological: Negative for dizziness and headaches.  Hematological: Negative for adenopathy. Does not bruise/bleed easily.  Psychiatric/Behavioral: Negative for suicidal ideas, disturbed wake/sleep cycle and dysphoric mood. The patient is not nervous/anxious.        Objective:   Physical Exam  Constitutional: She is oriented to person, place, and time. She appears well-developed and well-nourished. No distress.  HENT:  Head: Normocephalic and atraumatic.  Right Ear: External ear normal.  Left Ear: External ear normal.  Nose: Nose normal.  Mouth/Throat: Oropharynx is clear and moist. No  oropharyngeal exudate.  Eyes: Conjunctivae normal are normal. Pupils are equal, round, and reactive to light. Right eye exhibits no discharge. Left eye exhibits no discharge. No scleral icterus.  Neck: Normal range of motion. Neck supple. No tracheal deviation present. No thyromegaly present.  Cardiovascular: Normal rate, regular rhythm, normal heart sounds and intact distal pulses.  Exam reveals no gallop and no friction rub.   No murmur heard. Pulmonary/Chest: Effort normal and breath sounds normal. No respiratory distress. She has no wheezes. She has no rales. She exhibits no tenderness.  Musculoskeletal: Normal range of motion. She exhibits no edema and no tenderness.  Lymphadenopathy:    She has no cervical adenopathy.  Neurological: She is alert and oriented to person, place, and time. No cranial nerve deficit. She exhibits normal muscle tone. Coordination normal.  Skin: Skin is warm and dry. No rash noted. She is not diaphoretic. No erythema. No pallor.  Psychiatric: She has a normal mood and affect. Her behavior is normal. Judgment and thought content normal.          Assessment & Plan:

## 2012-07-16 NOTE — Assessment & Plan Note (Signed)
Symptoms have improved. Will taper her Wellbutrin down to 150 mg daily. Patient will call if any problems at this dose. If she tolerates this well x1 month she will plan to start medication.

## 2012-07-17 LAB — HM MAMMOGRAPHY: HM Mammogram: NORMAL

## 2012-08-11 ENCOUNTER — Telehealth: Payer: Self-pay | Admitting: Internal Medicine

## 2012-08-11 ENCOUNTER — Ambulatory Visit: Payer: Self-pay | Admitting: Internal Medicine

## 2012-08-11 LAB — COMPREHENSIVE METABOLIC PANEL
Albumin: 4.1 g/dL (ref 3.4–5.0)
Anion Gap: 11 (ref 7–16)
Bilirubin,Total: 0.3 mg/dL (ref 0.2–1.0)
Chloride: 102 mmol/L (ref 98–107)
Creatinine: 0.98 mg/dL (ref 0.60–1.30)
EGFR (African American): >60
Potassium: 3.9 mmol/L (ref 3.5–5.1)
SGPT (ALT): 63 U/L (ref 12–78)
Total Protein: 7.5 g/dL (ref 6.4–8.2)

## 2012-08-11 LAB — LIPID PANEL
Cholesterol: 172 mg/dL (ref 0–200)
HDL Cholesterol: 45 mg/dL (ref 40–60)
Ldl Cholesterol, Calc: 98 mg/dL (ref 0–100)
Triglycerides: 143 mg/dL (ref 0–200)
VLDL Cholesterol, Calc: 29 mg/dL (ref 5–40)

## 2012-08-11 LAB — CBC WITH DIFFERENTIAL/PLATELET
Basophil #: 0.1 10*3/uL (ref 0.0–0.1)
Basophil %: 0.9 %
Eosinophil %: 4.7 %
HCT: 40.8 % (ref 35.0–47.0)
HGB: 14.1 g/dL (ref 12.0–16.0)
Lymphocyte %: 20.8 %
MCHC: 34.6 g/dL (ref 32.0–36.0)
MCV: 87 fL (ref 80–100)
Monocyte #: 0.5 x10 3/mm (ref 0.2–0.9)
Monocyte %: 7.4 %
Neutrophil #: 4.4 10*3/uL (ref 1.4–6.5)
RBC: 4.69 10*6/uL (ref 3.80–5.20)
RDW: 13.1 % (ref 11.5–14.5)
WBC: 6.7 10*3/uL (ref 3.6–11.0)

## 2012-08-11 NOTE — Telephone Encounter (Signed)
Labs normal.

## 2012-08-15 ENCOUNTER — Encounter: Payer: Self-pay | Admitting: Internal Medicine

## 2012-08-25 ENCOUNTER — Encounter: Payer: Self-pay | Admitting: Internal Medicine

## 2012-08-26 ENCOUNTER — Ambulatory Visit: Payer: Self-pay | Admitting: Internal Medicine

## 2012-09-25 ENCOUNTER — Encounter: Payer: Self-pay | Admitting: Internal Medicine

## 2012-09-29 ENCOUNTER — Other Ambulatory Visit: Payer: Self-pay | Admitting: Internal Medicine

## 2012-09-29 DIAGNOSIS — I10 Essential (primary) hypertension: Secondary | ICD-10-CM

## 2012-09-29 MED ORDER — TRIAMTERENE-HCTZ 37.5-25 MG PO TABS: 1.0000 | ORAL_TABLET | Freq: Every day | ORAL | Status: DC

## 2012-09-29 NOTE — Telephone Encounter (Signed)
Triamterene-HCTZ 37.5-25  Take one tablet by mouth daily  # 90

## 2012-09-29 NOTE — Telephone Encounter (Signed)
Med filled.  

## 2013-01-06 LAB — HM PAP SMEAR: HM Pap smear: NEGATIVE

## 2013-01-15 ENCOUNTER — Encounter: Payer: Self-pay | Admitting: Internal Medicine

## 2013-01-15 ENCOUNTER — Other Ambulatory Visit (HOSPITAL_COMMUNITY)
Admission: RE | Admit: 2013-01-15 | Discharge: 2013-01-15 | Disposition: A | Discharge status: Home / Self Care | Payer: 59 | Source: Ambulatory Visit | Attending: Internal Medicine | Admitting: Internal Medicine

## 2013-01-15 ENCOUNTER — Ambulatory Visit (INDEPENDENT_AMBULATORY_CARE_PROVIDER_SITE_OTHER): Payer: 59 | Admitting: Internal Medicine

## 2013-01-15 VITALS — BP 134/88 | HR 84 | Temp 98.2°F | Ht 61.25 in | Wt 199.0 lb

## 2013-01-15 DIAGNOSIS — Z1151 Encounter for screening for human papillomavirus (HPV): Secondary | ICD-10-CM | POA: Insufficient documentation

## 2013-01-15 DIAGNOSIS — R7989 Other specified abnormal findings of blood chemistry: Secondary | ICD-10-CM

## 2013-01-15 DIAGNOSIS — E7800 Pure hypercholesterolemia, unspecified: Secondary | ICD-10-CM

## 2013-01-15 DIAGNOSIS — F329 Major depressive disorder, single episode, unspecified: Secondary | ICD-10-CM

## 2013-01-15 DIAGNOSIS — E039 Hypothyroidism, unspecified: Secondary | ICD-10-CM

## 2013-01-15 DIAGNOSIS — R945 Abnormal results of liver function studies: Secondary | ICD-10-CM

## 2013-01-15 DIAGNOSIS — Z01419 Encounter for gynecological examination (general) (routine) without abnormal findings: Secondary | ICD-10-CM | POA: Insufficient documentation

## 2013-01-15 DIAGNOSIS — Z0000 Encounter for general adult medical examination without abnormal findings: Secondary | ICD-10-CM | POA: Insufficient documentation

## 2013-01-15 DIAGNOSIS — F341 Dysthymic disorder: Secondary | ICD-10-CM

## 2013-01-15 DIAGNOSIS — Z124 Encounter for screening for malignant neoplasm of cervix: Secondary | ICD-10-CM

## 2013-01-15 DIAGNOSIS — K219 Gastro-esophageal reflux disease without esophagitis: Secondary | ICD-10-CM

## 2013-01-15 DIAGNOSIS — J309 Allergic rhinitis, unspecified: Secondary | ICD-10-CM

## 2013-01-15 DIAGNOSIS — F419 Anxiety disorder, unspecified: Secondary | ICD-10-CM

## 2013-01-15 DIAGNOSIS — I10 Essential (primary) hypertension: Secondary | ICD-10-CM

## 2013-01-15 LAB — COMPREHENSIVE METABOLIC PANEL
ALT: 49 U/L — ABNORMAL HIGH (ref 0–35)
Albumin: 4.4 g/dL (ref 3.5–5.2)
CO2: 30 mEq/L (ref 19–32)
GFR: 64.51 mL/min (ref >60.00)
Glucose, Bld: 86 mg/dL (ref 70–99)
Potassium: 3.9 mEq/L (ref 3.5–5.1)
Sodium: 139 mEq/L (ref 135–145)
Total Bilirubin: 0.7 mg/dL (ref 0.3–1.2)
Total Protein: 7.5 g/dL (ref 6.0–8.3)

## 2013-01-15 LAB — LIPID PANEL
Cholesterol: 179 mg/dL (ref 0–200)
LDL Cholesterol: 108 mg/dL — ABNORMAL HIGH (ref 0–99)
VLDL: 28 mg/dL (ref 0.0–40.0)

## 2013-01-15 LAB — CBC WITH DIFFERENTIAL/PLATELET
Basophils Relative: 1 % (ref 0.0–3.0)
Eosinophils Relative: 5 % (ref 0.0–5.0)
Hemoglobin: 13.7 g/dL (ref 12.0–15.0)
Lymphocytes Relative: 19.7 % (ref 12.0–46.0)
MCV: 87.3 fl (ref 78.0–100.0)
Neutro Abs: 4.3 10*3/uL (ref 1.4–7.7)
Neutrophils Relative %: 66.3 % (ref 43.0–77.0)
RBC: 4.67 Mil/uL (ref 3.87–5.11)
WBC: 6.5 10*3/uL (ref 4.5–10.5)

## 2013-01-15 MED ORDER — BUPROPION HCL ER (XL) 300 MG PO TB24: 300.0000 mg | ORAL_TABLET | Freq: Every day | ORAL | Status: DC

## 2013-01-15 MED ORDER — TRIAMTERENE-HCTZ 37.5-25 MG PO TABS: 1.0000 | ORAL_TABLET | Freq: Every day | ORAL | Status: DC

## 2013-01-15 MED ORDER — AMLODIPINE BESYLATE 5 MG PO TABS: 5.0000 mg | ORAL_TABLET | Freq: Every day | ORAL | Status: DC

## 2013-01-15 MED ORDER — PANTOPRAZOLE SODIUM 40 MG PO TBEC: 40.0000 mg | DELAYED_RELEASE_TABLET | Freq: Every day | ORAL | Status: DC

## 2013-01-15 MED ORDER — LEVOTHYROXINE SODIUM 50 MCG PO TABS: 50.0000 ug | ORAL_TABLET | Freq: Every day | ORAL | Status: DC

## 2013-01-15 MED ORDER — AZELASTINE HCL 0.1 % NA SOLN: 1.0000 | Freq: Every evening | NASAL | Status: DC | PRN

## 2013-01-15 MED ORDER — CICLESONIDE 37 MCG/ACT NA AERS: 1.0000 | INHALATION_SPRAY | Freq: Every day | NASAL | Status: DC

## 2013-01-15 NOTE — Assessment & Plan Note (Signed)
BP Readings from Last 3 Encounters:  01/15/13 134/88  07/16/12 150/84  01/15/12 128/62   BP well controlled on current medication. Will continue.

## 2013-01-15 NOTE — Assessment & Plan Note (Signed)
Symptomatically doing well. Will check TSH with labs today. 

## 2013-01-15 NOTE — Assessment & Plan Note (Signed)
Symptoms recently worsen. Patient increased Wellbutrin back to 300 mg daily with some improvement. We'll continue to monitor.

## 2013-01-15 NOTE — Progress Notes (Signed)
Subjective:    Patient ID: Stacy Santana, female    DOB: 01-02-1951, 62 y.o.   MRN: [id number]  HPI 62 year old female with history of hypertension, anxiety presents for annual exam. She reports she is generally feeling well. She notes the last few months, her anxiety level has been increased and so she increased her Wellbutrin back to 300 mg daily. She reports some improvement with this. She has been trying to improve her diet and increase physical activity. She notes some issues with seasonal allergies that have been refractory to use of nonsedating antihistamines and nasal steroid. She notes clear nasal drainage occasional sneezing. She denies fever, chills, chest pain, shortness of breath.  She has not been taking Boniva for osteopenia, as had some myalgia with this medication.  Outpatient Encounter Prescriptions as of 01/15/2013  Medication Sig Dispense Refill  . amLODipine (NORVASC) 5 MG tablet Take 1 tablet (5 mg total) by mouth daily.  90 tablet  3  . azelastine (ASTELIN) 137 MCG/SPRAY nasal spray Place 1 spray into the nose at bedtime as needed. Use in each nostril as directed  30 mL  3  . buPROPion (WELLBUTRIN XL) 300 MG 24 hr tablet Take 1 tablet (300 mg total) by mouth daily.  90 tablet  3  . Cholecalciferol (VITAMIN D PO) Take 1 tablet by mouth daily.        [name] levothyroxine (SYNTHROID) 50 MCG tablet Take 1 tablet (50 mcg total) by mouth daily.  90 tablet  3  . loratadine (CLARITIN) 10 MG tablet Take 10 mg by mouth daily.        . Naproxen Sodium 220 MG CAPS Take 2 capsules by mouth daily.        . Omega-3 Fatty Acids (FISH OIL PO) Take 1 capsule by mouth daily.        . pantoprazole (PROTONIX) 40 MG tablet Take 1 tablet (40 mg total) by mouth daily.  90 tablet  3  . travoprost, benzalkonium, (TRAVATAN) 0.004 % ophthalmic solution Place 2 drops into both eyes at bedtime.      . triamterene-hydrochlorothiazide (MAXZIDE-25) 37.5-25 MG per tablet Take 1 each (1 tablet total) by mouth  daily.  90 tablet  4  . bimatoprost (LUMIGAN) 0.03 % ophthalmic solution Place 1 drop into both eyes at bedtime.        [name] CALCIUM PO Take 1,200 mg by mouth daily.       . Ciclesonide (ZETONNA) 37 MCG/ACT AERS Place 1 spray into the nose daily.  6.1 g  3   No facility-administered encounter medications on file as of 01/15/2013.   BP 134/88  Pulse 84  Temp(Src) 98.2 F (36.8 C) (Oral)  Ht 5' 1.25" (1.556 m)  Wt 199 lb (90.266 kg)  BMI 37.28 kg/m2  SpO2 98%  Review of Systems  Constitutional: Negative for fever, chills, appetite change, fatigue and unexpected weight change.  HENT: Positive for rhinorrhea and postnasal drip. Negative for ear pain, congestion, sore throat, trouble swallowing, neck pain, voice change and sinus pressure.   Eyes: Negative for visual disturbance.  Respiratory: Negative for cough, shortness of breath, wheezing and stridor.   Cardiovascular: Negative for chest pain, palpitations and leg swelling.  Gastrointestinal: Negative for nausea, vomiting, abdominal pain, diarrhea, constipation, blood in stool, abdominal distention and anal bleeding.  Genitourinary: Negative for dysuria and flank pain.  Musculoskeletal: Negative for myalgias, arthralgias and gait problem.  Skin: Negative for color change and rash.  Neurological: Negative for dizziness and headaches.  Hematological: Negative for adenopathy. Does not bruise/bleed easily.  Psychiatric/Behavioral: Positive for dysphoric mood. Negative for suicidal ideas and sleep disturbance. The patient is nervous/anxious.        Objective:   Physical Exam  Constitutional: She is oriented to person, place, and time. She appears well-developed and well-nourished. No distress.  HENT:  Head: Normocephalic and atraumatic.  Right Ear: External ear normal.  Left Ear: External ear normal.  Nose: Nose normal.  Mouth/Throat: Oropharynx is clear and moist. No oropharyngeal exudate.  Eyes: Conjunctivae are normal. Pupils are  equal, round, and reactive to light. Right eye exhibits no discharge. Left eye exhibits no discharge. No scleral icterus.  Neck: Normal range of motion. Neck supple. No tracheal deviation present. No thyromegaly present.  Cardiovascular: Normal rate, regular rhythm, normal heart sounds and intact distal pulses.  Exam reveals no gallop and no friction rub.   No murmur heard. Pulmonary/Chest: Effort normal and breath sounds normal. No respiratory distress. She has no wheezes. She has no rales. She exhibits no tenderness.  Abdominal: Soft. Bowel sounds are normal. She exhibits no distension and no mass. There is no tenderness. There is no rebound and no guarding.  Genitourinary: Rectum normal, vagina normal and uterus normal. No breast swelling, tenderness, discharge or bleeding. Pelvic exam was performed with patient supine. There is no rash, tenderness or lesion on the right labia. There is no rash, tenderness or lesion on the left labia. Uterus is not enlarged and not tender. Cervix exhibits no motion tenderness, no discharge and no friability. Right adnexum displays no mass, no tenderness and no fullness. Left adnexum displays no mass, no tenderness and no fullness. No erythema or tenderness around the vagina. No vaginal discharge found.  Musculoskeletal: Normal range of motion. She exhibits no edema and no tenderness.  Lymphadenopathy:    She has no cervical adenopathy.  Neurological: She is alert and oriented to person, place, and time. No cranial nerve deficit. She exhibits normal muscle tone. Coordination normal.  Skin: Skin is warm and dry. No rash noted. She is not diaphoretic. No erythema. No pallor.  Psychiatric: She has a normal mood and affect. Her behavior is normal. Judgment and thought content normal.          Assessment & Plan:

## 2013-01-15 NOTE — Assessment & Plan Note (Signed)
General medical exam including breast and pelvic exam normal today. Pap is pending. Mammogram is up-to-date. Will order bone density testing. Will check labs including CBC, CMP, lipid profile. Vaccinations are up-to-date. Encouraged healthy diet and regular physical activity. Followup in 6 months or sooner as needed.

## 2013-01-16 LAB — VITAMIN D 25 HYDROXY (VIT D DEFICIENCY, FRACTURES): Vit D, 25-Hydroxy: 52 ng/mL (ref 30–89)

## 2013-01-19 ENCOUNTER — Encounter: Payer: Self-pay | Admitting: Internal Medicine

## 2013-01-19 ENCOUNTER — Telehealth: Payer: Self-pay | Admitting: Internal Medicine

## 2013-01-19 ENCOUNTER — Ambulatory Visit: Payer: Self-pay | Admitting: Internal Medicine

## 2013-01-19 NOTE — Telephone Encounter (Signed)
Bone Density T-score -1.7

## 2013-01-20 ENCOUNTER — Encounter: Payer: Self-pay | Admitting: Internal Medicine

## 2013-02-05 ENCOUNTER — Encounter: Payer: Self-pay | Admitting: Internal Medicine

## 2013-07-20 ENCOUNTER — Ambulatory Visit: Payer: 59 | Admitting: Internal Medicine

## 2013-07-21 ENCOUNTER — Ambulatory Visit (INDEPENDENT_AMBULATORY_CARE_PROVIDER_SITE_OTHER): Payer: 59 | Admitting: Internal Medicine

## 2013-07-21 ENCOUNTER — Encounter: Payer: Self-pay | Admitting: Internal Medicine

## 2013-07-21 VITALS — BP 132/86 | HR 81 | Temp 98.4°F | Wt 199.0 lb

## 2013-07-21 DIAGNOSIS — Z1239 Encounter for other screening for malignant neoplasm of breast: Secondary | ICD-10-CM

## 2013-07-21 DIAGNOSIS — E669 Obesity, unspecified: Secondary | ICD-10-CM | POA: Insufficient documentation

## 2013-07-21 DIAGNOSIS — Z23 Encounter for immunization: Secondary | ICD-10-CM

## 2013-07-21 DIAGNOSIS — I10 Essential (primary) hypertension: Secondary | ICD-10-CM

## 2013-07-21 DIAGNOSIS — E7800 Pure hypercholesterolemia, unspecified: Secondary | ICD-10-CM

## 2013-07-21 LAB — COMPREHENSIVE METABOLIC PANEL
ALT: 48 U/L — ABNORMAL HIGH (ref 0–35)
AST: 36 U/L (ref 0–37)
Albumin: 4.4 g/dL (ref 3.5–5.2)
Alkaline Phosphatase: 83 U/L (ref 39–117)
BUN: 13 mg/dL (ref 6–23)
Potassium: 4 mEq/L (ref 3.5–5.1)

## 2013-07-21 LAB — MICROALBUMIN / CREATININE URINE RATIO
Creatinine,U: 78.7 mg/dL
Microalb Creat Ratio: 0.5 mg/g (ref 0.0–30.0)

## 2013-07-21 NOTE — Progress Notes (Signed)
Subjective:    Patient ID: Stacy Santana, female    DOB: 1951/02/11, 62 y.o.   MRN: [id number]  HPI 62 year old female with history of hypertension, hyperlipidemia, hypothyroidism presents for followup. She reports she is generally feeling well. Overall stress level much reduced after retiring from her job. She has been. Active, traveling with her husband. She denies any concerns today. She notes that she has recently joined a local gym and plans to participate in an exercise program with goal of weight loss.  Outpatient Encounter Prescriptions as of 07/21/2013  Medication Sig Dispense Refill  . amLODipine (NORVASC) 5 MG tablet Take 1 tablet (5 mg total) by mouth daily.  90 tablet  3  . azelastine (ASTELIN) 137 MCG/SPRAY nasal spray Place 1 spray into the nose at bedtime as needed. Use in each nostril as directed  30 mL  3  . bimatoprost (LUMIGAN) 0.03 % ophthalmic solution Place 1 drop into both eyes at bedtime.        [name] buPROPion (WELLBUTRIN XL) 300 MG 24 hr tablet Take 1 tablet (300 mg total) by mouth daily.  90 tablet  3  . CALCIUM PO Take 1,200 mg by mouth daily.       . Cholecalciferol (VITAMIN D PO) Take 1 tablet by mouth daily.        . Ciclesonide (ZETONNA) 37 MCG/ACT AERS Place 1 spray into the nose daily.  6.1 g  3  . levothyroxine (SYNTHROID) 50 MCG tablet Take 1 tablet (50 mcg total) by mouth daily.  90 tablet  3  . loratadine (CLARITIN) 10 MG tablet Take 10 mg by mouth daily.        . Naproxen Sodium 220 MG CAPS Take 2 capsules by mouth daily.        . Omega-3 Fatty Acids (FISH OIL PO) Take 1 capsule by mouth daily.        . pantoprazole (PROTONIX) 40 MG tablet Take 1 tablet (40 mg total) by mouth daily.  90 tablet  3  . travoprost, benzalkonium, (TRAVATAN) 0.004 % ophthalmic solution Place 2 drops into both eyes at bedtime.      . triamterene-hydrochlorothiazide (MAXZIDE-25) 37.5-25 MG per tablet Take 1 each (1 tablet total) by mouth daily.  90 tablet  4  . ibandronate (BONIVA)  150 MG tablet Take 150 mg by mouth every 30 (thirty) days. Take in the morning with a full glass of water, on an empty stomach, and do not take anything else by mouth or lie down for the next 30 min.        No facility-administered encounter medications on file as of 07/21/2013.   BP 132/86  Pulse 81  Temp(Src) 98.4 F (36.9 C) (Oral)  Wt 199 lb (90.266 kg)  BMI 37.28 kg/m2  SpO2 98%  Review of Systems  Constitutional: Negative for fever, chills, appetite change, fatigue and unexpected weight change.  HENT: Negative for congestion, ear pain, sinus pressure, sore throat, trouble swallowing and voice change.   Eyes: Negative for visual disturbance.  Respiratory: Negative for cough, shortness of breath, wheezing and stridor.   Cardiovascular: Negative for chest pain, palpitations and leg swelling.  Gastrointestinal: Negative for nausea, vomiting, abdominal pain, diarrhea, constipation, blood in stool, abdominal distention and anal bleeding.  Genitourinary: Negative for dysuria and flank pain.  Musculoskeletal: Negative for arthralgias, gait problem, myalgias and neck pain.  Skin: Negative for color change and rash.  Neurological: Negative for dizziness and headaches.  Hematological: Negative for adenopathy. Does not  bruise/bleed easily.  Psychiatric/Behavioral: Negative for suicidal ideas, sleep disturbance and dysphoric mood. The patient is not nervous/anxious.        Objective:   Physical Exam  Constitutional: She is oriented to person, place, and time. She appears well-developed and well-nourished. No distress.  HENT:  Head: Normocephalic and atraumatic.  Right Ear: External ear normal.  Left Ear: External ear normal.  Nose: Nose normal.  Mouth/Throat: Oropharynx is clear and moist. No oropharyngeal exudate.  Eyes: Conjunctivae are normal. Pupils are equal, round, and reactive to light. Right eye exhibits no discharge. Left eye exhibits no discharge. No scleral icterus.  Neck:  Normal range of motion. Neck supple. No tracheal deviation present. No thyromegaly present.  Cardiovascular: Normal rate, regular rhythm, normal heart sounds and intact distal pulses.  Exam reveals no gallop and no friction rub.   No murmur heard. Pulmonary/Chest: Effort normal and breath sounds normal. No accessory muscle usage. Not tachypneic. No respiratory distress. She has no decreased breath sounds. She has no wheezes. She has no rhonchi. She has no rales. She exhibits no tenderness.  Musculoskeletal: Normal range of motion. She exhibits no edema and no tenderness.  Lymphadenopathy:    She has no cervical adenopathy.  Neurological: She is alert and oriented to person, place, and time. No cranial nerve deficit. She exhibits normal muscle tone. Coordination normal.  Skin: Skin is warm and dry. No rash noted. She is not diaphoretic. No erythema. No pallor.  Psychiatric: She has a normal mood and affect. Her behavior is normal. Judgment and thought content normal.          Assessment & Plan:

## 2013-07-21 NOTE — Assessment & Plan Note (Signed)
Wt Readings from Last 3 Encounters:  07/21/13 199 lb (90.266 kg)  01/15/13 199 lb (90.266 kg)  07/16/12 196 lb 8 oz (89.132 kg)   Encouraged healthy diet and increased physical activity with goal of weight loss.

## 2013-07-21 NOTE — Assessment & Plan Note (Signed)
BP Readings from Last 3 Encounters:  07/21/13 132/86  01/15/13 134/88  07/16/12 150/84   BP well controlled on current medication. Will continue.

## 2013-07-21 NOTE — Assessment & Plan Note (Signed)
Will check lipids and LFTs with labs today. 

## 2013-08-30 ENCOUNTER — Encounter: Payer: Self-pay | Admitting: Internal Medicine

## 2013-09-01 ENCOUNTER — Ambulatory Visit: Payer: Self-pay | Admitting: Internal Medicine

## 2013-09-07 LAB — HM MAMMOGRAPHY: HM Mammogram: NORMAL

## 2013-10-14 ENCOUNTER — Other Ambulatory Visit: Payer: Self-pay | Admitting: Internal Medicine

## 2013-12-23 ENCOUNTER — Ambulatory Visit: Payer: 59 | Admitting: Internal Medicine

## 2013-12-24 ENCOUNTER — Encounter: Payer: Self-pay | Admitting: Internal Medicine

## 2013-12-24 ENCOUNTER — Ambulatory Visit (INDEPENDENT_AMBULATORY_CARE_PROVIDER_SITE_OTHER): Payer: 59 | Admitting: Internal Medicine

## 2013-12-24 VITALS — BP 128/88 | HR 80 | Temp 98.2°F | Wt 191.0 lb

## 2013-12-24 DIAGNOSIS — E7800 Pure hypercholesterolemia, unspecified: Secondary | ICD-10-CM

## 2013-12-24 DIAGNOSIS — F341 Dysthymic disorder: Secondary | ICD-10-CM

## 2013-12-24 DIAGNOSIS — I10 Essential (primary) hypertension: Secondary | ICD-10-CM

## 2013-12-24 DIAGNOSIS — F419 Anxiety disorder, unspecified: Secondary | ICD-10-CM

## 2013-12-24 DIAGNOSIS — F329 Major depressive disorder, single episode, unspecified: Secondary | ICD-10-CM

## 2013-12-24 DIAGNOSIS — E039 Hypothyroidism, unspecified: Secondary | ICD-10-CM

## 2013-12-24 DIAGNOSIS — E559 Vitamin D deficiency, unspecified: Secondary | ICD-10-CM

## 2013-12-24 LAB — MICROALBUMIN / CREATININE URINE RATIO
Creatinine,U: 67.8 mg/dL
MICROALB UR: 0.1 mg/dL (ref 0.0–1.9)
Microalb Creat Ratio: 0.1 mg/g (ref 0.0–30.0)

## 2013-12-24 LAB — COMPREHENSIVE METABOLIC PANEL
ALT: 37 U/L — AB (ref 0–35)
AST: 27 U/L (ref 0–37)
Albumin: 4.6 g/dL (ref 3.5–5.2)
Alkaline Phosphatase: 82 U/L (ref 39–117)
BUN: 19 mg/dL (ref 6–23)
CALCIUM: 10.1 mg/dL (ref 8.4–10.5)
CHLORIDE: 102 meq/L (ref 96–112)
CO2: 28 meq/L (ref 19–32)
CREATININE: 0.9 mg/dL (ref 0.4–1.2)
GFR: 67.67 mL/min (ref >60.00)
Glucose, Bld: 89 mg/dL (ref 70–99)
Potassium: 4.4 mEq/L (ref 3.5–5.1)
Sodium: 140 mEq/L (ref 135–145)
Total Bilirubin: 0.5 mg/dL (ref 0.3–1.2)
Total Protein: 7.1 g/dL (ref 6.0–8.3)

## 2013-12-24 LAB — CBC WITH DIFFERENTIAL/PLATELET
BASOS PCT: 0.5 % (ref 0.0–3.0)
Basophils Absolute: 0 10*3/uL (ref 0.0–0.1)
EOS ABS: 0.3 10*3/uL (ref 0.0–0.7)
Eosinophils Relative: 4.2 % (ref 0.0–5.0)
HCT: 39.5 % (ref 36.0–46.0)
HEMOGLOBIN: 13.5 g/dL (ref 12.0–15.0)
Lymphocytes Relative: 23.4 % (ref 12.0–46.0)
Lymphs Abs: 1.4 10*3/uL (ref 0.7–4.0)
MCHC: 34.1 g/dL (ref 30.0–36.0)
MCV: 86.7 fl (ref 78.0–100.0)
MONOS PCT: 7.7 % (ref 3.0–12.0)
Monocytes Absolute: 0.5 10*3/uL (ref 0.1–1.0)
NEUTROS ABS: 3.9 10*3/uL (ref 1.4–7.7)
Neutrophils Relative %: 64.2 % (ref 43.0–77.0)
Platelets: 344 10*3/uL (ref 150.0–400.0)
RBC: 4.55 Mil/uL (ref 3.87–5.11)
RDW: 12.7 % (ref 11.5–14.6)
WBC: 6.1 10*3/uL (ref 4.5–10.5)

## 2013-12-24 LAB — LIPID PANEL
CHOL/HDL RATIO: 4
Cholesterol: 188 mg/dL (ref 0–200)
HDL: 51.8 mg/dL (ref >39.00)
LDL Cholesterol: 113 mg/dL — ABNORMAL HIGH (ref 0–99)
TRIGLYCERIDES: 116 mg/dL (ref 0.0–149.0)
VLDL: 23.2 mg/dL (ref 0.0–40.0)

## 2013-12-24 LAB — TSH: TSH: 2.97 u[IU]/mL (ref 0.35–5.50)

## 2013-12-24 MED ORDER — TRIAMTERENE-HCTZ 37.5-25 MG PO TABS: 1.0000 | ORAL_TABLET | Freq: Every day | ORAL | Status: DC

## 2013-12-24 MED ORDER — AMLODIPINE BESYLATE 5 MG PO TABS: 5.0000 mg | ORAL_TABLET | Freq: Every day | ORAL | Status: DC

## 2013-12-24 MED ORDER — IBANDRONATE SODIUM 150 MG PO TABS: 150.0000 mg | ORAL_TABLET | ORAL | Status: DC

## 2013-12-24 MED ORDER — BUPROPION HCL ER (XL) 300 MG PO TB24: 300.0000 mg | ORAL_TABLET | Freq: Every day | ORAL | Status: DC

## 2013-12-24 MED ORDER — LEVOTHYROXINE SODIUM 50 MCG PO TABS: 50.0000 ug | ORAL_TABLET | Freq: Every day | ORAL | Status: DC

## 2013-12-24 NOTE — Assessment & Plan Note (Signed)
BP Readings from Last 3 Encounters:  12/24/13 128/88  07/21/13 132/86  01/15/13 134/88   Blood pressure well-controlled on Maxzide and Norvasc. Will continue.

## 2013-12-24 NOTE — Progress Notes (Signed)
Subjective:    Patient ID: Stacy Santana, female    DOB: 08-31-1951, 63 y.o.   MRN: [id number]  HPI 63YO female presents for follow up.  Had flu in January. Symptoms have now resolved.  Working part time at [employer]. Enjoying lighter work schedule and free time.  No new concerns today. Compliant with medications.    Review of Systems  Constitutional: Negative for fever, chills, appetite change, fatigue and unexpected weight change.  HENT: Negative for congestion, ear pain, sinus pressure, sore throat, trouble swallowing and voice change.   Eyes: Negative for visual disturbance.  Respiratory: Negative for cough, shortness of breath, wheezing and stridor.   Cardiovascular: Negative for chest pain, palpitations and leg swelling.  Gastrointestinal: Negative for nausea, vomiting, abdominal pain, diarrhea, constipation, blood in stool, abdominal distention and anal bleeding.  Genitourinary: Negative for dysuria and flank pain.  Musculoskeletal: Negative for arthralgias, gait problem, myalgias and neck pain.  Skin: Negative for color change and rash.  Neurological: Negative for dizziness and headaches.  Hematological: Negative for adenopathy. Does not bruise/bleed easily.  Psychiatric/Behavioral: Negative for suicidal ideas, sleep disturbance and dysphoric mood. The patient is not nervous/anxious.        Objective:    BP 128/88  Pulse 80  Temp(Src) 98.2 F (36.8 C) (Oral)  Wt 191 lb (86.637 kg)  SpO2 97% Physical Exam  Constitutional: She is oriented to person, place, and time. She appears well-developed and well-nourished. No distress.  HENT:  Head: Normocephalic and atraumatic.  Right Ear: External ear normal.  Left Ear: External ear normal.  Nose: Nose normal.  Mouth/Throat: Oropharynx is clear and moist. No oropharyngeal exudate.  Eyes: Conjunctivae are normal. Pupils are equal, round, and reactive to light. Right eye exhibits no discharge. Left eye exhibits no discharge.  No scleral icterus.  Neck: Normal range of motion. Neck supple. No tracheal deviation present. No thyromegaly present.  Cardiovascular: Normal rate, regular rhythm, normal heart sounds and intact distal pulses.  Exam reveals no gallop and no friction rub.   No murmur heard. Pulmonary/Chest: Effort normal and breath sounds normal. No accessory muscle usage. Not tachypneic. No respiratory distress. She has no decreased breath sounds. She has no wheezes. She has no rhonchi. She has no rales. She exhibits no tenderness.  Musculoskeletal: Normal range of motion. She exhibits no edema and no tenderness.  Lymphadenopathy:    She has no cervical adenopathy.  Neurological: She is alert and oriented to person, place, and time. No cranial nerve deficit. She exhibits normal muscle tone. Coordination normal.  Skin: Skin is warm and dry. No rash noted. She is not diaphoretic. No erythema. No pallor.  Psychiatric: She has a normal mood and affect. Her behavior is normal. Judgment and thought content normal.          Assessment & Plan:   Problem List Items Addressed This Visit   Anxiety and depression     Symptoms well controlled on Wellbutrin. Will continue.    Relevant Medications      buPROPion (WELLBUTRIN XL) 24 hr tablet   Hypertension - Primary      BP Readings from Last 3 Encounters:  12/24/13 128/88  07/21/13 132/86  01/15/13 134/88   Blood pressure well-controlled on Maxzide and Norvasc. Will continue.    Relevant Medications      amLODIpine (NORVASC) tablet      triamterene-hydrochlorothiazide (MAXZIDE-25) 37.5-25 MG per tablet   Other Relevant Orders      CBC with Differential (Completed)  Comprehensive metabolic panel (Completed)      Microalbumin / creatinine urine ratio (Completed)   Hypothyroidism     Thyroid function stable. We'll continue Levothyroxine.    Relevant Medications      levothyroxine (SYNTHROID, LEVOTHROID) tablet   Other Relevant Orders      TSH  (Completed)   Pure hypercholesterolemia     Will check lipids with labs today.    Relevant Orders      Lipid panel (Completed)    Other Visit Diagnoses   Unspecified vitamin D deficiency        Relevant Orders       Vit D  25 hydroxy (rtn osteoporosis monitoring)        Return in about 3 months (around 03/26/2014) for Physical.

## 2013-12-24 NOTE — Assessment & Plan Note (Signed)
Will check lipids with labs today.  

## 2013-12-24 NOTE — Assessment & Plan Note (Signed)
Symptoms well controlled on Wellbutrin. Will continue.

## 2013-12-24 NOTE — Progress Notes (Signed)
Pre visit review using our clinic review tool, if applicable. No additional management support is needed unless otherwise documented below in the visit note. 

## 2013-12-24 NOTE — Assessment & Plan Note (Addendum)
Thyroid function stable. We'll continue Levothyroxine.

## 2013-12-25 ENCOUNTER — Telehealth: Payer: Self-pay | Admitting: Internal Medicine

## 2013-12-25 LAB — VITAMIN D 25 HYDROXY (VIT D DEFICIENCY, FRACTURES): VIT D 25 HYDROXY: 66 ng/mL (ref 30–89)

## 2013-12-25 NOTE — Telephone Encounter (Signed)
Relevant patient education assigned to patient using Emmi. ° °

## 2014-01-29 HISTORY — PX: CARPAL TUNNEL RELEASE: SHX101

## 2014-02-17 DIAGNOSIS — M79609 Pain in unspecified limb: Secondary | ICD-10-CM | POA: Diagnosis not present

## 2014-02-17 DIAGNOSIS — IMO0002 Reserved for concepts with insufficient information to code with codable children: Secondary | ICD-10-CM | POA: Diagnosis not present

## 2014-02-17 DIAGNOSIS — R52 Pain, unspecified: Secondary | ICD-10-CM | POA: Diagnosis not present

## 2014-02-17 DIAGNOSIS — G5600 Carpal tunnel syndrome, unspecified upper limb: Secondary | ICD-10-CM | POA: Diagnosis not present

## 2014-02-25 ENCOUNTER — Other Ambulatory Visit: Payer: Self-pay | Admitting: *Deleted

## 2014-02-26 DIAGNOSIS — E669 Obesity, unspecified: Secondary | ICD-10-CM | POA: Diagnosis not present

## 2014-02-26 DIAGNOSIS — Z79899 Other long term (current) drug therapy: Secondary | ICD-10-CM | POA: Diagnosis not present

## 2014-02-26 DIAGNOSIS — F329 Major depressive disorder, single episode, unspecified: Secondary | ICD-10-CM | POA: Diagnosis not present

## 2014-02-26 DIAGNOSIS — E039 Hypothyroidism, unspecified: Secondary | ICD-10-CM | POA: Diagnosis not present

## 2014-02-26 DIAGNOSIS — I10 Essential (primary) hypertension: Secondary | ICD-10-CM | POA: Diagnosis not present

## 2014-02-26 DIAGNOSIS — M1990 Unspecified osteoarthritis, unspecified site: Secondary | ICD-10-CM | POA: Diagnosis not present

## 2014-02-26 DIAGNOSIS — Z87891 Personal history of nicotine dependence: Secondary | ICD-10-CM | POA: Diagnosis not present

## 2014-02-26 DIAGNOSIS — H409 Unspecified glaucoma: Secondary | ICD-10-CM | POA: Diagnosis not present

## 2014-02-26 DIAGNOSIS — G5600 Carpal tunnel syndrome, unspecified upper limb: Secondary | ICD-10-CM | POA: Diagnosis not present

## 2014-02-26 DIAGNOSIS — F3289 Other specified depressive episodes: Secondary | ICD-10-CM | POA: Diagnosis not present

## 2014-02-26 DIAGNOSIS — Z885 Allergy status to narcotic agent status: Secondary | ICD-10-CM | POA: Diagnosis not present

## 2014-02-26 DIAGNOSIS — Z6833 Body mass index (BMI) 33.0-33.9, adult: Secondary | ICD-10-CM | POA: Diagnosis not present

## 2014-03-02 DIAGNOSIS — G5760 Lesion of plantar nerve, unspecified lower limb: Secondary | ICD-10-CM | POA: Diagnosis not present

## 2014-04-07 ENCOUNTER — Ambulatory Visit (INDEPENDENT_AMBULATORY_CARE_PROVIDER_SITE_OTHER): Payer: PRIVATE HEALTH INSURANCE | Admitting: Internal Medicine

## 2014-04-07 ENCOUNTER — Encounter: Payer: Self-pay | Admitting: Internal Medicine

## 2014-04-07 VITALS — BP 112/78 | HR 88 | Temp 98.4°F | Ht 61.5 in | Wt 189.8 lb

## 2014-04-07 DIAGNOSIS — E039 Hypothyroidism, unspecified: Secondary | ICD-10-CM | POA: Diagnosis not present

## 2014-04-07 DIAGNOSIS — E7800 Pure hypercholesterolemia, unspecified: Secondary | ICD-10-CM

## 2014-04-07 DIAGNOSIS — Z23 Encounter for immunization: Secondary | ICD-10-CM | POA: Diagnosis not present

## 2014-04-07 DIAGNOSIS — E669 Obesity, unspecified: Secondary | ICD-10-CM | POA: Diagnosis not present

## 2014-04-07 DIAGNOSIS — Z1211 Encounter for screening for malignant neoplasm of colon: Secondary | ICD-10-CM | POA: Insufficient documentation

## 2014-04-07 DIAGNOSIS — Z0000 Encounter for general adult medical examination without abnormal findings: Secondary | ICD-10-CM | POA: Diagnosis not present

## 2014-04-07 DIAGNOSIS — I10 Essential (primary) hypertension: Secondary | ICD-10-CM

## 2014-04-07 LAB — LIPID PANEL
CHOLESTEROL: 214 mg/dL — AB (ref 0–200)
HDL: 52.6 mg/dL (ref >39.00)
LDL Cholesterol: 126 mg/dL — ABNORMAL HIGH (ref 0–99)
NonHDL: 161.4
Total CHOL/HDL Ratio: 4
Triglycerides: 179 mg/dL — ABNORMAL HIGH (ref 0.0–149.0)
VLDL: 35.8 mg/dL (ref 0.0–40.0)

## 2014-04-07 LAB — COMPREHENSIVE METABOLIC PANEL
ALBUMIN: 4.6 g/dL (ref 3.5–5.2)
ALT: 31 U/L (ref 0–35)
AST: 29 U/L (ref 0–37)
Alkaline Phosphatase: 86 U/L (ref 39–117)
BUN: 18 mg/dL (ref 6–23)
CO2: 26 mEq/L (ref 19–32)
Calcium: 10.1 mg/dL (ref 8.4–10.5)
Chloride: 100 mEq/L (ref 96–112)
Creatinine, Ser: 0.8 mg/dL (ref 0.4–1.2)
GFR: 72.27 mL/min (ref >60.00)
GLUCOSE: 90 mg/dL (ref 70–99)
POTASSIUM: 3.9 meq/L (ref 3.5–5.1)
Sodium: 137 mEq/L (ref 135–145)
Total Bilirubin: 0.6 mg/dL (ref 0.2–1.2)
Total Protein: 7.9 g/dL (ref 6.0–8.3)

## 2014-04-07 LAB — CBC WITH DIFFERENTIAL/PLATELET
BASOS ABS: 0 10*3/uL (ref 0.0–0.1)
Basophils Relative: 0.4 % (ref 0.0–3.0)
EOS ABS: 0.3 10*3/uL (ref 0.0–0.7)
Eosinophils Relative: 3.7 % (ref 0.0–5.0)
HCT: 41.6 % (ref 36.0–46.0)
Hemoglobin: 14.4 g/dL (ref 12.0–15.0)
Lymphocytes Relative: 19 % (ref 12.0–46.0)
Lymphs Abs: 1.4 10*3/uL (ref 0.7–4.0)
MCHC: 34.5 g/dL (ref 30.0–36.0)
MCV: 87 fl (ref 78.0–100.0)
MONO ABS: 0.6 10*3/uL (ref 0.1–1.0)
Monocytes Relative: 8.1 % (ref 3.0–12.0)
NEUTROS PCT: 68.8 % (ref 43.0–77.0)
Neutro Abs: 5.2 10*3/uL (ref 1.4–7.7)
PLATELETS: 312 10*3/uL (ref 150.0–400.0)
RBC: 4.78 Mil/uL (ref 3.87–5.11)
RDW: 13.8 % (ref 11.5–15.5)
WBC: 7.6 10*3/uL (ref 4.0–10.5)

## 2014-04-07 LAB — TSH: TSH: 2.41 u[IU]/mL (ref 0.35–4.50)

## 2014-04-07 LAB — MICROALBUMIN / CREATININE URINE RATIO
CREATININE, U: 40.2 mg/dL
Microalb Creat Ratio: 0.5 mg/g (ref 0.0–30.0)
Microalb, Ur: 0.2 mg/dL (ref 0.0–1.9)

## 2014-04-07 NOTE — Assessment & Plan Note (Signed)
Lab Results  Component Value Date   TSH 2.97 12/24/2013   Will recheck TSH with labs today. Continue Levothyroxine.

## 2014-04-07 NOTE — Assessment & Plan Note (Signed)
Will check lipids and LFTs with labs today. 

## 2014-04-07 NOTE — Assessment & Plan Note (Signed)
BP Readings from Last 3 Encounters:  04/07/14 112/78  12/24/13 128/88  07/21/13 132/86   BP well controlled on current medications. Will continue. Will check renal function with labs today.

## 2014-04-07 NOTE — Assessment & Plan Note (Signed)
Wt Readings from Last 3 Encounters:  04/07/14 189 lb 12 oz (86.07 kg)  12/24/13 191 lb (86.637 kg)  07/21/13 199 lb (90.266 kg)   Body mass index is 35.28 kg/(m^2). Encouraged healthy, Mediterranean style diet and regular exercise with goal of 45min 3x per week.

## 2014-04-07 NOTE — Progress Notes (Signed)
The patient is here for annual Medicare Wellness Examination and management of other chronic and acute problems.   The risk factors are reflected in the history.  The roster of all physicians providing medical care to patient - is listed in the Snapshot section of the chart.  Activities of daily living:   The patient is 100% independent in all ADLs: dressing, toileting, feeding as well as independent mobility. Patient lives with lives in free-standing home, two story. Carpet upstairs. Hardwood downstairs. Planning to live downstairs as they get old. Has 2 cats. Previously had a dog.  Home safety :  The patient has smoke detectors in the home.  They wear seatbelts in their car. There are no firearms at home.  There is no violence in the home. They feel safe where they live.  Infectious Risks: There is no risks for hepatitis, STDs or HIV.  There is no  history of blood transfusion.  They have no travel history to infectious disease endemic areas of the world.  Additional Health Care Providers: The patient has seen their dentist in the last six months. Dentist - Dr. [place], in [facility] They have seen their eye doctor in the last year. Opthalmologist - Dr. Atilano Median in [place] They deny hearing issues. They have deferred audiologic testing in the last year.   They do not  have excessive sun exposure. Discussed the need for sun protection: hats,long sleeves and use of sunscreen if there is significant sun exposure.  Dermatologist - Dr. Kellie Moor in the past  Diet: the importance of a healthy diet is discussed. They do have a healthy diet.  The benefits of regular aerobic exercise were discussed. Patient exercises by walking some. Has Silver Sneakers, through [employer].  Depression screen: there are no signs or vegative symptoms of depression- irritability, change in appetite, anhedonia, sadness/tearfullness.  Cognitive assessment: the patient manages all their financial and personal affairs and is  actively engaged. They could relate day,date,year and events.  [place] Living Will -  In place  The following portions of the patient's history were reviewed and updated as appropriate: allergies, current medications, past family history, past medical history,  past surgical history, past social history and problem list.  Visual acuity was not assessed per patient preference as they have regular follow up with their ophthalmologist. Hearing and body mass index were assessed and reviewed.   During the course of the visit the patient was educated and counseled about appropriate screening and preventive services including : fall prevention , diabetes screening, nutrition counseling, colorectal cancer screening, and recommended immunizations.    Review of Systems  Constitutional: Negative for fever, chills, appetite change, fatigue and unexpected weight change.  Eyes: Negative for visual disturbance.  Respiratory: Negative for shortness of breath.   Cardiovascular: Negative for chest pain and leg swelling.  Gastrointestinal: Negative for abdominal pain.  Skin: Negative for color change and rash.  Hematological: Negative for adenopathy. Does not bruise/bleed easily.  Psychiatric/Behavioral: Negative for dysphoric mood. The patient is not nervous/anxious.        Objective:    BP 112/78  Pulse 88  Temp(Src) 98.4 F (36.9 C) (Oral)  Ht 5' 1.5" (1.562 m)  Wt 189 lb 12 oz (86.07 kg)  BMI 35.28 kg/m2  SpO2 97% Physical Exam  Constitutional: She is oriented to person, place, and time. She appears well-developed and well-nourished. No distress.  HENT:  Head: Normocephalic and atraumatic.  Right Ear: External ear normal.  Left Ear: External ear  normal.  Nose: Nose normal.  Mouth/Throat: Oropharynx is clear and moist. No oropharyngeal exudate.  Eyes: Conjunctivae are normal. Pupils are equal, round, and reactive to light. Right eye exhibits no discharge. Left eye exhibits no  discharge. No scleral icterus.  Neck: Normal range of motion. Neck supple. No tracheal deviation present. No thyromegaly present.  Cardiovascular: Normal rate, regular rhythm, normal heart sounds and intact distal pulses.  Exam reveals no gallop and no friction rub.   No murmur heard. Pulmonary/Chest: Effort normal and breath sounds normal. No accessory muscle usage. Not tachypneic. No respiratory distress. She has no decreased breath sounds. She has no wheezes. She has no rhonchi. She has no rales. She exhibits no tenderness.  Musculoskeletal: Normal range of motion. She exhibits no edema and no tenderness.  Lymphadenopathy:    She has no cervical adenopathy.  Neurological: She is alert and oriented to person, place, and time. No cranial nerve deficit. She exhibits normal muscle tone. Coordination normal.  Skin: Skin is warm and dry. No rash noted. She is not diaphoretic. No erythema. No pallor.  Psychiatric: She has a normal mood and affect. Her behavior is normal. Judgment and thought content normal.          Assessment & Plan:   Problem List Items Addressed This Visit     Unprioritized   Hypertension      BP Readings from Last 3 Encounters:  04/07/14 112/78  12/24/13 128/88  07/21/13 132/86   BP well controlled on current medications. Will continue. Will check renal function with labs today.    Hypothyroidism      Lab Results  Component Value Date   TSH 2.97 12/24/2013   Will recheck TSH with labs today. Continue Levothyroxine.    Relevant Orders      TSH   Medicare annual wellness visit, initial - Primary     General medical exam including breast exam normal today. PAP and pelvic deferred as PAP normal, HPV neg in 12/2012. Plan repeat PAP in 2017. Colonoscopy UTD. Mammogram UTD 08/2013. Will plan repeat mammogram and bone density testing in 08/2014. Prevnar given today. Plan for Pneumovax in 1 year. Encouraged healthy diet and regular exercise. Labs today including CBC,  CMP, lipids, TSH. Follow up in 6 months and prn.    Relevant Orders      CBC with Differential      Comprehensive metabolic panel      Lipid panel      Microalbumin / creatinine urine ratio      EKG 12-Lead (Completed)   Obesity (BMI 30-39.9)      Wt Readings from Last 3 Encounters:  04/07/14 189 lb 12 oz (86.07 kg)  12/24/13 191 lb (86.637 kg)  07/21/13 199 lb (90.266 kg)   Body mass index is 35.28 kg/(m^2). Encouraged healthy, Mediterranean style diet and regular exercise with goal of 38min 3x per week.    Pure hypercholesterolemia     Will check lipids and LFTs with labs today.        Return in about 6 months (around 10/08/2014).

## 2014-04-07 NOTE — Assessment & Plan Note (Signed)
General medical exam including breast exam normal today. PAP and pelvic deferred as PAP normal, HPV neg in 12/2012. Plan repeat PAP in 2017. Colonoscopy UTD. Mammogram UTD 08/2013. Will plan repeat mammogram and bone density testing in 08/2014. Prevnar given today. Plan for Pneumovax in 1 year. Encouraged healthy diet and regular exercise. Labs today including CBC, CMP, lipids, TSH. Follow up in 6 months and prn.

## 2014-04-07 NOTE — Patient Instructions (Signed)
It was nice to see you today.  You are doing well.  We will check labs and give you the Prevnar vaccine today.  Continue healthy diet and set a goal of exercising 40 minutes three times per week.  Follow up in 6 months or sooner as needed.

## 2014-04-07 NOTE — Addendum Note (Signed)
Addended by: Vernetta Honey on: 04/07/2014 12:02 PM   Modules accepted: Orders

## 2014-04-07 NOTE — Progress Notes (Signed)
Pre visit review using our clinic review tool, if applicable. No additional management support is needed unless otherwise documented below in the visit note. 

## 2014-04-17 DIAGNOSIS — H6980 Other specified disorders of Eustachian tube, unspecified ear: Secondary | ICD-10-CM | POA: Diagnosis not present

## 2014-06-18 DIAGNOSIS — H4011X0 Primary open-angle glaucoma, stage unspecified: Secondary | ICD-10-CM | POA: Diagnosis not present

## 2014-06-18 DIAGNOSIS — H524 Presbyopia: Secondary | ICD-10-CM | POA: Diagnosis not present

## 2014-08-23 ENCOUNTER — Encounter: Payer: Self-pay | Admitting: Internal Medicine

## 2014-08-23 ENCOUNTER — Other Ambulatory Visit: Payer: Self-pay | Admitting: *Deleted

## 2014-08-23 MED ORDER — AZELASTINE HCL 0.1 % NA SOLN: 1.0000 | Freq: Every evening | NASAL | Status: DC | PRN

## 2014-09-04 ENCOUNTER — Encounter: Payer: Self-pay | Admitting: Internal Medicine

## 2014-10-11 ENCOUNTER — Encounter: Payer: Self-pay | Admitting: Internal Medicine

## 2014-10-11 ENCOUNTER — Ambulatory Visit (INDEPENDENT_AMBULATORY_CARE_PROVIDER_SITE_OTHER): Payer: Medicare Other | Admitting: Internal Medicine

## 2014-10-11 VITALS — BP 143/78 | HR 92 | Temp 98.4°F | Ht 62.25 in | Wt 192.8 lb

## 2014-10-11 DIAGNOSIS — E039 Hypothyroidism, unspecified: Secondary | ICD-10-CM | POA: Diagnosis not present

## 2014-10-11 DIAGNOSIS — Z0000 Encounter for general adult medical examination without abnormal findings: Secondary | ICD-10-CM | POA: Diagnosis not present

## 2014-10-11 DIAGNOSIS — Z1239 Encounter for other screening for malignant neoplasm of breast: Secondary | ICD-10-CM | POA: Diagnosis not present

## 2014-10-11 DIAGNOSIS — E669 Obesity, unspecified: Secondary | ICD-10-CM | POA: Diagnosis not present

## 2014-10-11 DIAGNOSIS — F418 Other specified anxiety disorders: Secondary | ICD-10-CM | POA: Diagnosis not present

## 2014-10-11 DIAGNOSIS — F329 Major depressive disorder, single episode, unspecified: Secondary | ICD-10-CM

## 2014-10-11 DIAGNOSIS — I10 Essential (primary) hypertension: Secondary | ICD-10-CM

## 2014-10-11 DIAGNOSIS — F32A Depression, unspecified: Secondary | ICD-10-CM

## 2014-10-11 DIAGNOSIS — F419 Anxiety disorder, unspecified: Secondary | ICD-10-CM

## 2014-10-11 NOTE — Assessment & Plan Note (Signed)
Wt Readings from Last 3 Encounters:  10/11/14 192 lb 12 oz (87.431 kg)  04/07/14 189 lb 12 oz (86.07 kg)  12/24/13 191 lb (86.637 kg)   Body mass index is 34.98 kg/(m^2). The patient is asked to make an attempt to improve diet and exercise patterns to aid in medical management of this problem.

## 2014-10-11 NOTE — Assessment & Plan Note (Signed)
Symptoms well controlled on wellbutrin. Will continue.

## 2014-10-11 NOTE — Assessment & Plan Note (Signed)
BP Readings from Last 3 Encounters:  10/11/14 143/78  04/07/14 112/78  12/24/13 128/88   BP well controlled generally. Will continue to monitor. Renal function with labs at time of Wellness Visit.

## 2014-10-11 NOTE — Assessment & Plan Note (Signed)
Symptomatically doing well. Continue Levothyroxine. Repeat labs in 6 months.

## 2014-10-11 NOTE — Progress Notes (Signed)
Subjective:    Patient ID: Stacy Santana, female    DOB: 1950/11/11, 64 y.o.   MRN: [id number]  HPI 64YO female presents for follow up.  HTN - Missed meds yesterday. BP at home typically 342-876O systolic. No chest pain, palpitations.  Continues to take Naprosyn almost daily for arthritis pain. Symptoms well controlled with this.  Working 3 days per week at [employer]  Feeling well. No new concerns today.   BP Readings from Last 3 Encounters:  10/11/14 143/78  04/07/14 112/78  12/24/13 128/88    Past medical, surgical, family and social history per today's encounter.  Review of Systems  Constitutional: Negative for fever, chills, appetite change, fatigue and unexpected weight change.  Eyes: Negative for visual disturbance.  Respiratory: Negative for shortness of breath.   Cardiovascular: Negative for chest pain and leg swelling.  Gastrointestinal: Negative for abdominal pain.  Musculoskeletal: Negative for myalgias and arthralgias.  Skin: Negative for color change and rash.  Hematological: Negative for adenopathy. Does not bruise/bleed easily.  Psychiatric/Behavioral: Negative for suicidal ideas, sleep disturbance and dysphoric mood. The patient is not nervous/anxious.        Objective:    BP 143/78 mmHg  Pulse 92  Temp(Src) 98.4 F (36.9 C) (Oral)  Ht 5' 2.25" (1.581 m)  Wt 192 lb 12 oz (87.431 kg)  BMI 34.98 kg/m2  SpO2 98% Physical Exam  Constitutional: She is oriented to person, place, and time. She appears well-developed and well-nourished. No distress.  HENT:  Head: Normocephalic and atraumatic.  Right Ear: External ear normal.  Left Ear: External ear normal.  Nose: Nose normal.  Mouth/Throat: Oropharynx is clear and moist. No oropharyngeal exudate.  Eyes: Conjunctivae are normal. Pupils are equal, round, and reactive to light. Right eye exhibits no discharge. Left eye exhibits no discharge. No scleral icterus.  Neck: Normal range of motion. Neck  supple. No tracheal deviation present. No thyromegaly present.  Cardiovascular: Normal rate, regular rhythm, normal heart sounds and intact distal pulses.  Exam reveals no gallop and no friction rub.   No murmur heard. Pulmonary/Chest: Effort normal and breath sounds normal. No accessory muscle usage. No tachypnea. No respiratory distress. She has no decreased breath sounds. She has no wheezes. She has no rhonchi. She has no rales. She exhibits no tenderness.  Musculoskeletal: Normal range of motion. She exhibits no edema or tenderness.  Lymphadenopathy:    She has no cervical adenopathy.  Neurological: She is alert and oriented to person, place, and time. No cranial nerve deficit. She exhibits normal muscle tone. Coordination normal.  Skin: Skin is warm and dry. No rash noted. She is not diaphoretic. No erythema. No pallor.  Psychiatric: She has a normal mood and affect. Her behavior is normal. Judgment and thought content normal.          Assessment & Plan:  Over 66min of which >50% spent in face-to-face contact with patient discussing plan of care  Problem List Items Addressed This Visit      Unprioritized   Anxiety and depression    Symptoms well controlled on wellbutrin. Will continue.    Hypertension    BP Readings from Last 3 Encounters:  10/11/14 143/78  04/07/14 112/78  12/24/13 128/88   BP well controlled generally. Will continue to monitor. Renal function with labs at time of Wellness Visit.    Hypothyroidism    Symptomatically doing well. Continue Levothyroxine. Repeat labs in 6 months.    RESOLVED: Medicare annual wellness visit,  subsequent - Primary   Obesity (BMI 30-39.9)    Wt Readings from Last 3 Encounters:  10/11/14 192 lb 12 oz (87.431 kg)  04/07/14 189 lb 12 oz (86.07 kg)  12/24/13 191 lb (86.637 kg)   Body mass index is 34.98 kg/(m^2). The patient is asked to make an attempt to improve diet and exercise patterns to aid in medical management of this  problem.     Screening for breast cancer   Relevant Orders      MM Digital Screening       Return in about 6 months (around 04/11/2015) for Wellness Visit.

## 2014-10-11 NOTE — Patient Instructions (Signed)
We will schedule mammogram.  Follow up 6 months for Wellness Visit.

## 2014-10-11 NOTE — Progress Notes (Signed)
Pre visit review using our clinic review tool, if applicable. No additional management support is needed unless otherwise documented below in the visit note. 

## 2014-10-13 DIAGNOSIS — G5761 Lesion of plantar nerve, right lower limb: Secondary | ICD-10-CM | POA: Diagnosis not present

## 2014-10-13 DIAGNOSIS — M79671 Pain in right foot: Secondary | ICD-10-CM | POA: Diagnosis not present

## 2015-01-06 ENCOUNTER — Ambulatory Visit (INDEPENDENT_AMBULATORY_CARE_PROVIDER_SITE_OTHER): Payer: Medicare Other | Admitting: Internal Medicine

## 2015-01-06 ENCOUNTER — Encounter: Payer: Self-pay | Admitting: Internal Medicine

## 2015-01-06 ENCOUNTER — Ambulatory Visit: Payer: Medicare Other | Admitting: Internal Medicine

## 2015-01-06 VITALS — BP 120/79 | HR 86 | Temp 98.3°F | Ht 62.5 in | Wt 191.4 lb

## 2015-01-06 DIAGNOSIS — M170 Bilateral primary osteoarthritis of knee: Secondary | ICD-10-CM

## 2015-01-06 DIAGNOSIS — M25511 Pain in right shoulder: Secondary | ICD-10-CM | POA: Diagnosis not present

## 2015-01-06 DIAGNOSIS — I10 Essential (primary) hypertension: Secondary | ICD-10-CM

## 2015-01-06 MED ORDER — AMLODIPINE BESYLATE 5 MG PO TABS: 5.0000 mg | ORAL_TABLET | Freq: Every day | ORAL | Status: DC

## 2015-01-06 NOTE — Patient Instructions (Signed)
We will set up physical therapy.  Follow up in 4 weeks.

## 2015-01-06 NOTE — Progress Notes (Signed)
Subjective:    Patient ID: Stacy Santana, female    DOB: [birth year], 64 y.o.   MRN: [id number]  HPI 64YO female presents for acute visit.  Right shoulder pain - Anterior right shoulder pain started last November. 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 Pain is worsened with lifting. Pain comes and goes. Wakes up at night. Taking Naproxen on occasion with some improvement  Pt also notes bilateral knee "popping" and cracking with exercise. No pain noted.   BP Readings from Last 3 Encounters:  01/06/15 120/79  10/11/14 143/78  04/07/14 112/78    Past medical, surgical, family and social history per today's encounter.  Review of Systems  Constitutional: Negative for fever, chills, appetite change, fatigue and unexpected weight change.  Eyes: Negative for visual disturbance.  Respiratory: Negative for shortness of breath.   Cardiovascular: Negative for chest pain and leg swelling.  Gastrointestinal: Negative for abdominal pain.  Musculoskeletal: Positive for myalgias and arthralgias. Negative for joint swelling.  Skin: Negative for color change and rash.  Neurological: Negative for weakness and headaches.  Hematological: Negative for adenopathy. Does not bruise/bleed easily.  Psychiatric/Behavioral: Negative for dysphoric mood. The patient is not nervous/anxious.        Objective:    BP 120/79 mmHg  Pulse 86  Temp(Src) 98.3 F (36.8 C) (Oral)  Ht 5' 2.5" (1.588 m)  Wt 191 lb 6 oz (86.807 kg)  BMI 34.42 kg/m2  SpO2 98% Physical Exam  Constitutional: She is oriented to person, place, and time. She appears well-developed and well-nourished. No distress.  HENT:  Head: Normocephalic and atraumatic.  Right Ear: External ear normal.  Left Ear: External ear normal.  Nose: Nose normal.  Mouth/Throat: Oropharynx is clear and moist. No oropharyngeal exudate.  Eyes: Conjunctivae are normal. Pupils are equal, round, and reactive to light. Right eye  exhibits no discharge. Left eye exhibits no discharge. No scleral icterus.  Neck: Normal range of motion. Neck supple. No tracheal deviation present. No thyromegaly present.  Cardiovascular: Normal rate, regular rhythm, normal heart sounds and intact distal pulses.  Exam reveals no gallop and no friction rub.   No murmur heard. Pulmonary/Chest: Effort normal and breath sounds normal. No respiratory distress. She has no wheezes. She has no rales. She exhibits no tenderness.  Musculoskeletal: Normal range of motion. She exhibits no edema.       Right shoulder: She exhibits tenderness (bicep head) and pain. She exhibits no bony tenderness and normal strength.       Right knee: She exhibits normal range of motion. No tenderness found.       Left knee: No tenderness found.  Crepitus bilateral knees   Lymphadenopathy:    She has no cervical adenopathy.  Neurological: She is alert and oriented to person, place, and time. No cranial nerve deficit. She exhibits normal muscle tone. Coordination normal.  Skin: Skin is warm and dry. No rash noted. She is not diaphoretic. No erythema. No pallor.  Psychiatric: She has a normal mood and affect. Her behavior is normal. Judgment and thought content normal.          Assessment & Plan:   Problem List Items Addressed This Visit      Unprioritized   Hypertension    BP Readings from Last 3 Encounters:  01/06/15 120/79  10/11/14 143/78  04/07/14 112/78   BP well controlled on current medication. Will continue.      Relevant Medications   amLODIpine (NORVASC) tablet  Osteoarthritis of both knees    Crepitus noted in bilateral knees on exam. Discussed evaluation with xray, and potential use of steroid injection or Synvisc, however given no symptoms of pain at present, will hold off. Continue prn Naproxen.      Right shoulder pain - Primary    Exam is most consistent with biceps tendonitis. Will set up PT. Continue Naproxen. Discussed sports med  referral, but will hold off for now.      Relevant Orders   Ambulatory referral to Physical Therapy       Return in about 4 weeks (around 02/03/2015) for Recheck.

## 2015-01-06 NOTE — Assessment & Plan Note (Signed)
Crepitus noted in bilateral knees on exam. Discussed evaluation with xray, and potential use of steroid injection or Synvisc, however given no symptoms of pain at present, will hold off. Continue prn Naproxen.

## 2015-01-06 NOTE — Assessment & Plan Note (Signed)
Exam is most consistent with biceps tendonitis. Will set up PT. Continue Naproxen. Discussed sports med referral, but will hold off for now.

## 2015-01-06 NOTE — Progress Notes (Signed)
Pre visit review using our clinic review tool, if applicable. No additional management support is needed unless otherwise documented below in the visit note. 

## 2015-01-06 NOTE — Assessment & Plan Note (Signed)
BP Readings from Last 3 Encounters:  01/06/15 120/79  10/11/14 143/78  04/07/14 112/78   BP well controlled on current medication. Will continue.

## 2015-01-10 ENCOUNTER — Encounter
Admit: 2015-01-10 | Disposition: A | Discharge status: Home / Self Care | Payer: Self-pay | Attending: Internal Medicine | Admitting: Internal Medicine

## 2015-01-10 DIAGNOSIS — M25511 Pain in right shoulder: Secondary | ICD-10-CM | POA: Diagnosis not present

## 2015-01-10 DIAGNOSIS — M6281 Muscle weakness (generalized): Secondary | ICD-10-CM | POA: Diagnosis not present

## 2015-01-13 DIAGNOSIS — M25511 Pain in right shoulder: Secondary | ICD-10-CM | POA: Diagnosis not present

## 2015-01-13 DIAGNOSIS — M6281 Muscle weakness (generalized): Secondary | ICD-10-CM | POA: Diagnosis not present

## 2015-01-18 DIAGNOSIS — M6281 Muscle weakness (generalized): Secondary | ICD-10-CM | POA: Diagnosis not present

## 2015-01-18 DIAGNOSIS — M25511 Pain in right shoulder: Secondary | ICD-10-CM | POA: Diagnosis not present

## 2015-01-19 DIAGNOSIS — M25511 Pain in right shoulder: Secondary | ICD-10-CM | POA: Diagnosis not present

## 2015-01-19 DIAGNOSIS — M6281 Muscle weakness (generalized): Secondary | ICD-10-CM | POA: Diagnosis not present

## 2015-01-25 DIAGNOSIS — M6281 Muscle weakness (generalized): Secondary | ICD-10-CM | POA: Diagnosis not present

## 2015-01-25 DIAGNOSIS — M79672 Pain in left foot: Secondary | ICD-10-CM | POA: Diagnosis not present

## 2015-01-25 DIAGNOSIS — M19072 Primary osteoarthritis, left ankle and foot: Secondary | ICD-10-CM | POA: Diagnosis not present

## 2015-01-25 DIAGNOSIS — M25511 Pain in right shoulder: Secondary | ICD-10-CM | POA: Diagnosis not present

## 2015-01-26 DIAGNOSIS — M25511 Pain in right shoulder: Secondary | ICD-10-CM | POA: Diagnosis not present

## 2015-01-26 DIAGNOSIS — M6281 Muscle weakness (generalized): Secondary | ICD-10-CM | POA: Diagnosis not present

## 2015-01-30 HISTORY — PX: FOOT NEUROMA SURGERY: SHX646

## 2015-01-31 ENCOUNTER — Encounter: Payer: Self-pay | Admitting: Physical Therapy

## 2015-01-31 ENCOUNTER — Ambulatory Visit: Payer: Medicare Other | Attending: Internal Medicine | Admitting: Physical Therapy

## 2015-01-31 DIAGNOSIS — M25551 Pain in right hip: Secondary | ICD-10-CM | POA: Insufficient documentation

## 2015-01-31 DIAGNOSIS — M25511 Pain in right shoulder: Secondary | ICD-10-CM | POA: Diagnosis present

## 2015-01-31 DIAGNOSIS — M6281 Muscle weakness (generalized): Secondary | ICD-10-CM | POA: Insufficient documentation

## 2015-01-31 NOTE — Therapy (Signed)
[facility] [address], [place], [address] Phone: [phone]   Fax:  [phone]  Physical Therapy Treatment  Patient Details  Name: Stacy Santana MRN: [id number] Date of Birth: 07/01/1951 Referring Provider:  Jackolyn Confer, MD  Encounter Date: 01/31/2015      PT End of Session - 01/31/15 1105    Visit Number 7   Number of Visits 13   Date for PT Re-Evaluation 02/21/15   Authorization Type 7   Authorization Time Period 10   PT Start Time 0945   PT Stop Time 5003   PT Time Calculation (min) 50 min      Past Medical History  Diagnosis Date  . Hypertension   . Glaucoma     left eye, Dr. Morrison Old in [place]  . Osteoporosis     osteopenia, Fosamax then Boniva 2009  . Vitamin D deficiency     on supplements  . Allergy     seasonal  . Endometriosis 1989    small amount  . HSV infection   . Carpal tunnel syndrome   . Degenerative joint disease   . Depression     Past Surgical History  Procedure Laterality Date  . Nasal sinus surgery  2012    Dr. Pryor Ochoa  . Laparoscopy  1999  . Appendectomy  1964    open  . Tonsillectomy    . Tubal ligation  1992  . Carpal tunnel release Right 01/2014    Dr. Malvin Johns    There were no vitals filed for this visit.  Visit Diagnosis:  Pain of right shoulder region      Subjective Assessment - 01/31/15 0955    Subjective (p) Patient reports still having intermittent pain in right shoulder, better with most motions,  she is exercising as instructed, not as consistent over the weekend due [place] the [place]            [facility] PT Assessment - 01/31/15 0001    Assessment   Medical Diagnosis pain in right shoulder   Onset Date 08/01/14   Next MD Visit 02/21/2015   Precautions   Precautions None   Balance Screen   Has the patient fallen in the past 6 months No        SUBJECTIVE: Intermittent pain right shoulder, better following high volt estim.  previous session and able to move right arm better with most motions, she is still having difficulty with reaching forward, out to side and behind back with pain in front of right shoulder limiting motions. Overall she reports she feels at least 50% improved since beginning physical therapy.     PAIN (with rating/descriptors)Pain is 3-4/10 anterior aspect of right shoulder into upper arm, aching and intermittent   OBJECTIVE: 1. Manual therapy soft tissue mobilization: right upper trapezius release STM right shoulder deltoid and upper trapezius muscle spasm/trigger point with patient in seated position 2. guided through exercises for strengthening: scapular rows at cable OMEGA: tactile and verbal cued for decreasing shoulder hiking: controlled 1 x 10 reps with tactile cuing to engage lower trapezius muscles double UE's 10# and 10 reps, standing straight arm pull downs 2 x 5-10 reps with 10#, chest press with 5# with verbal cues for correct shoulder alignment not to go past horizontal and guidance/spotting of machine for safety to avoid pain, 10 reps 3. high volt estim. (2) electrodes, continuous mode appied by therapist to right upper trapezius muscle and right  shoulder anterior aspect with patient seated, Russian stim. 10/10 cycle to medial border of scapula/lower trapezius muscle, intensity to contraction for muscle re education with mild contraction achieved with patient able to contract during 10 second on cycle Patient able to raise right UE above shoulder level with mild symptoms and decreased spasms to mild and non palpable     CLINICAL IMPRESSION: Patient responded well to therapy intervention today with decreased pain and spasms in upper trapezius muscle and right shoulder and improved control of right peri scapular muscle . Overall she reports she is at least 50% improved since beginning physical therapy. She continues with pain and weakness as limiting factors to full functional use of right UE  and will benefit from additional physical therapy intervention for pain control and progressive exercises and manual therapy as indicated 2x/week.   PLAN: continue for pain control, progressive strengthening right UE/shoulder 2x/week                         PT Education - 01/31/15 1103    Education provided Yes   Education Details tatile and verbal cuing to encourage lower trapezius muscle recruitment with patient able to verbalize understanding and demonstrated improved technique/conrol during treatment session             PT Long Term Goals - 01/31/15 1107    PT LONG TERM GOAL #1   Title pt. will improve quickDASH to 25% to demonstrate improvement with self perceived disability with ADL's for lifting/pushing and overhead activities    Time 3   Period Weeks   Status New   PT LONG TERM GOAL #2   Title pt. will report decreased pain level to max 1-2/10 with aggravating activities    Time 3   Period Weeks   Status New   PT LONG TERM GOAL #3   Title Pt. will have as close to full AROM shoulder to allow improved function with overhead activities without pain in 3 weeks   Time 3   Period Weeks   Status New   PT LONG TERM GOAL #4   Title pt. will be independent with home program without cuing for pain control, exercise for flexibility, strength to improve ADL function using right UE    Time 6   Period Weeks   Status New   PT LONG TERM GOAL #5   Title pt. will improve quickDASH to 15% or less to demonstrate improvement with ADL's with UE for daily activities at home and work    Time 6   Period Weeks              Problem List Patient Active Problem List   Diagnosis Date Noted  . Right shoulder pain 01/06/2015  . Osteoarthritis of both knees 01/06/2015  . Medicare annual wellness visit, initial 04/07/2014  . Need for prophylactic vaccination and inoculation against influenza 07/21/2013  . Obesity (BMI 30-39.9) 07/21/2013  . Screening for breast  cancer 07/16/2012  . Anxiety and depression 07/16/2012  . GERD (gastroesophageal reflux disease) 07/16/2012  . Hypothyroidism 01/15/2012  . Pure hypercholesterolemia 12/12/2011  . Allergy   . Endometriosis   . Carpal tunnel syndrome   . Degenerative joint disease   . Hypertension 07/18/2011  . Allergic rhinitis 07/18/2011    Aldona Lento 01/31/2015, 11:36 AM  [facility] [address], [place], [address] Phone: [phone]   Fax:  [phone]

## 2015-01-31 NOTE — Patient Instructions (Signed)
Instructed patient in proper alignment of right shoulder/scapula during exercises to encourage lower trapezius recruitment with patient demonstrating good technique following tactile and verbal cuing.

## 2015-02-01 DIAGNOSIS — M1990 Unspecified osteoarthritis, unspecified site: Secondary | ICD-10-CM | POA: Diagnosis not present

## 2015-02-01 DIAGNOSIS — I10 Essential (primary) hypertension: Secondary | ICD-10-CM | POA: Diagnosis not present

## 2015-02-01 DIAGNOSIS — D3610 Benign neoplasm of peripheral nerves and autonomic nervous system, unspecified: Secondary | ICD-10-CM | POA: Diagnosis not present

## 2015-02-01 DIAGNOSIS — M79672 Pain in left foot: Secondary | ICD-10-CM | POA: Diagnosis not present

## 2015-02-01 DIAGNOSIS — G5762 Lesion of plantar nerve, left lower limb: Secondary | ICD-10-CM | POA: Diagnosis not present

## 2015-02-01 DIAGNOSIS — K219 Gastro-esophageal reflux disease without esophagitis: Secondary | ICD-10-CM | POA: Diagnosis not present

## 2015-02-01 DIAGNOSIS — Z87891 Personal history of nicotine dependence: Secondary | ICD-10-CM | POA: Diagnosis not present

## 2015-02-01 DIAGNOSIS — F329 Major depressive disorder, single episode, unspecified: Secondary | ICD-10-CM | POA: Diagnosis not present

## 2015-02-01 DIAGNOSIS — Z79899 Other long term (current) drug therapy: Secondary | ICD-10-CM | POA: Diagnosis not present

## 2015-02-01 DIAGNOSIS — H409 Unspecified glaucoma: Secondary | ICD-10-CM | POA: Diagnosis not present

## 2015-02-01 DIAGNOSIS — E039 Hypothyroidism, unspecified: Secondary | ICD-10-CM | POA: Diagnosis not present

## 2015-02-01 DIAGNOSIS — G8918 Other acute postprocedural pain: Secondary | ICD-10-CM | POA: Diagnosis not present

## 2015-02-02 ENCOUNTER — Encounter: Payer: Medicare Other | Admitting: Physical Therapy

## 2015-02-07 ENCOUNTER — Encounter: Payer: Medicare Other | Admitting: Physical Therapy

## 2015-02-11 ENCOUNTER — Encounter: Payer: Medicare Other | Admitting: Physical Therapy

## 2015-02-14 ENCOUNTER — Encounter: Payer: Self-pay | Admitting: Physical Therapy

## 2015-02-14 ENCOUNTER — Ambulatory Visit: Payer: Medicare Other | Admitting: Physical Therapy

## 2015-02-14 DIAGNOSIS — M25511 Pain in right shoulder: Secondary | ICD-10-CM

## 2015-02-14 DIAGNOSIS — M25551 Pain in right hip: Secondary | ICD-10-CM | POA: Diagnosis not present

## 2015-02-14 DIAGNOSIS — M6281 Muscle weakness (generalized): Secondary | ICD-10-CM | POA: Diagnosis not present

## 2015-02-14 NOTE — Therapy (Signed)
[facility] [address] [place], [address] Phone: [phone]   Fax:  [phone]  Physical Therapy Treatment  Patient Details  Name: Stacy Santana MRN: [id number] Date of Birth: 11-03-1950 Referring Provider:  Jackolyn Confer, MD  Encounter Date: 02/14/2015      PT End of Session - 02/14/15 0847    Visit Number 8   Number of Visits 13   Date for PT Re-Evaluation 02/21/15   Authorization Type 8   Authorization Time Period 10   PT Start Time 0805   PT Stop Time 0838   PT Time Calculation (min) 33 min   Activity Tolerance Patient tolerated treatment well   Behavior During Therapy [facility] for tasks assessed/performed      Past Medical History  Diagnosis Date  . Hypertension   . Glaucoma     left eye, Dr. Morrison Old in [place]  . Osteoporosis     osteopenia, Fosamax then Boniva 2009  . Vitamin D deficiency     on supplements  . Allergy     seasonal  . Endometriosis 1989    small amount  . HSV infection   . Carpal tunnel syndrome   . Degenerative joint disease   . Depression     Past Surgical History  Procedure Laterality Date  . Nasal sinus surgery  2012    Dr. Pryor Ochoa  . Laparoscopy  1999  . Appendectomy  1964    open  . Tonsillectomy    . Tubal ligation  1992  . Carpal tunnel release Right 01/2014    Dr. Malvin Johns    There were no vitals filed for this visit.  Visit Diagnosis:  Pain of right shoulder region      Subjective Assessment - 02/14/15 0812    Subjective Patient reports still having intermittent pain in right shoulder, better with most motions,  she is exercising as instructed. She is sleeping better without pain at night. She is beginnging to go to a new gym and work on scapular muscle strength and lower trapezius strenthening. She would like to go over some exercises that she can work on at the gym to improve strength in scapular muscles and lower trapezius so her right shoulder  pain will improve and not re occur.    Limitations Lifting;House hold activities   Patient Stated Goals to be able to transition to local fitness facility and continue on own   Currently in Pain? No/denies     Objective: Palpation: + increased thickness in right shoulder anterior aspect, over biceps tendon Impingement sign: mildly + with Michel Bickers and Neer right shoulder  Treatment: 1. Manual therapy:STM right shoulder anterior aspect x 5 min., joint mobilization right shoulder scapula, upper trapezius release 3 x 20 seconds (patient in side lying position), inferior distraction/mobilization with patient in supine position, patient returned demonstration for self mobilization in sitting with good technique and minimal verbal cuing Patient response: improved Michel Bickers with decreased symptoms, improved technique with mobilization following repetition, improved upper trapezius flexibility/elasticity with treatment 2. Therapeutic exercise: instructed in proper positioning of shoulder without hiking and improved control of lower trapezius muscle with tactile, manual resistive exercise in sitting, cable pull downs in standing, seated reverse chin ups with 15# with tactile  Patient response: improve lower trapezius control and activation following tactile cues and with verbal cues and good technique with lat pull downs and reverse chin up following instruction  PT Education - 02/14/15 [phone]    Education provided Yes   Education Details tatile and verbal cuing to decrease hiking of right shoulder and improve IR wihtout stiffness and scapular adduction with lower trapezius recruitment   Person(s) Educated Patient   Methods Explanation;Demonstration;Tactile cues;Verbal cues   Comprehension Verbalized understanding;Returned demonstration;Verbal cues required             PT Long Term Goals - 01/31/15 1107    PT LONG TERM GOAL #1   Title pt. will improve quickDASH to 25% to  demonstrate improvement with self perceived disability with ADL's for lifting/pushing and overhead activities    Time 3   Period Weeks   Status New   PT LONG TERM GOAL #2   Title pt. will report decreased pain level to max 1-2/10 with aggravating activities    Time 3   Period Weeks   Status New   PT LONG TERM GOAL #3   Title Pt. will have as close to full AROM shoulder to allow improved function with overhead activities without pain in 3 weeks   Time 3   Period Weeks   Status New   PT LONG TERM GOAL #4   Title pt. will be independent with home program without cuing for pain control, exercise for flexibility, strength to improve ADL function using right UE    Time 6   Period Weeks   Status New   PT LONG TERM GOAL #5   Title pt. will improve quickDASH to 15% or less to demonstrate improvement with ADL's with UE for daily activities at home and work    Time [place] - 02/14/15 0854    Clinical Impression Statement Patient is progressing well towards all goals and has only mild pain at present and mild stiffness in right shoulder. She demonstrated improved ability to perform horizontal adduciton with mild to no symptoms at end of session follwoing soft tissue mobilizaiton. She demonstrated improved technique for lat pull downs and reverse chin up following repetition and with verbal and tactile cues to recruit lower trapezius.    Pt will benefit from skilled therapeutic intervention in order to improve on the following deficits Decreased range of motion;Decreased strength;Pain   Rehab Potential Good   PT Frequency 2x / week   PT Duration 6 weeks   PT Next Visit Plan re assessment of right shoulder with quickDash and ROM/strength, manual therapy, therapeutic exercise        Problem List Patient Active Problem List   Diagnosis Date Noted  . Right shoulder pain 01/06/2015  . Osteoarthritis of both knees 01/06/2015  . Medicare annual wellness  visit, initial 04/07/2014  . Need for prophylactic vaccination and inoculation against influenza 07/21/2013  . Obesity (BMI 30-39.9) 07/21/2013  . Screening for breast cancer 07/16/2012  . Anxiety and depression 07/16/2012  . GERD (gastroesophageal reflux disease) 07/16/2012  . Hypothyroidism 01/15/2012  . Pure hypercholesterolemia 12/12/2011  . Allergy   . Endometriosis   . Carpal tunnel syndrome   . Degenerative joint disease   . Hypertension 07/18/2011  . Allergic rhinitis 07/18/2011   Jomarie Longs, PT   02/14/2015, 9:35 AM  [facility] [address] [place], [address] Phone: [phone]   Fax:  [phone]

## 2015-02-16 ENCOUNTER — Ambulatory Visit: Payer: Medicare Other

## 2015-02-16 DIAGNOSIS — M25551 Pain in right hip: Secondary | ICD-10-CM | POA: Diagnosis not present

## 2015-02-16 DIAGNOSIS — M6281 Muscle weakness (generalized): Secondary | ICD-10-CM | POA: Diagnosis not present

## 2015-02-16 DIAGNOSIS — M25511 Pain in right shoulder: Secondary | ICD-10-CM

## 2015-02-16 NOTE — Therapy (Signed)
[facility] [address] [place], [address] Phone: [phone]   Fax:  [phone]  Physical Therapy Treatment  Patient Details  Name: Stacy Santana MRN: [id number] Date of Birth: 1951-06-09 Referring Provider:  Jackolyn Confer, MD  Encounter Date: 02/16/2015      PT End of Session - 02/16/15 1152    Visit Number 9   Number of Visits 13   Date for PT Re-Evaluation 02/21/15   PT Start Time 0850   PT Stop Time 0910   PT Time Calculation (min) 20 min   Behavior During Therapy [facility] for tasks assessed/performed      Past Medical History  Diagnosis Date  . Hypertension   . Glaucoma     left eye, Dr. Morrison Old in [place]  . Osteoporosis     osteopenia, Fosamax then Boniva 2009  . Vitamin D deficiency     on supplements  . Allergy     seasonal  . Endometriosis 1989    small amount  . HSV infection   . Carpal tunnel syndrome   . Degenerative joint disease   . Depression     Past Surgical History  Procedure Laterality Date  . Nasal sinus surgery  2012    Dr. Pryor Ochoa  . Laparoscopy  1999  . Appendectomy  1964    open  . Tonsillectomy    . Tubal ligation  1992  . Carpal tunnel release Right 01/2014    Dr. Malvin Johns    There were no vitals filed for this visit.  Visit Diagnosis:  Pain of right shoulder region      Subjective Assessment - 02/16/15 1141    Subjective Pt arrives reporting that she does not need therapy services today and is ready for discharge. She has experienced considerable improvement in her symptoms and was able to go to the gym since her last appointment. Pt reports that she experienced global soreness in her R shoudler after exercise but no focal pain. Pt feels confident with HEP but reports feeling somewhat confused at the gym with respect to what machines to use.   Patient Stated Goals to be able to transition to local fitness facility and continue on own   Currently in Pain?  No/denies  Worst current pain is 1/10       TREATMENT: Reviewed HEP with patient. Obtained history and assessed symptoms. Palpation over R long head bicep tendon which is tender to moderate pressure but symmetrical to L side. Quick DASH provided to patient who scored 2.3% today. Worst reported pain is 1/10. AROM of R shoulder compared to L and is symmetrical except for 3-4 inch difference in R shoulder internal rotation as measured with HBB. R shoulder internal rotation is fully functional for all ADLs and IADLs. Flexion, abduction, and ER is symmetrical between R and L shoulders. Pt instructed in use of gym equipment and eccentric R bicep strengthening within pain-free limits. Discussed indications for returning to physical therapy and goals updated with patient. Pt defers further intervention during current therapy session.                           PT Education - 02/16/15 1150    Education provided Yes   Education Details Education regarding HEP. Discussed eccentric R bicep strengthening. Emphasized the importance of periscapular strengthening and lower trapezius recruitment. Discussed possible use of pool at her gym for shoulder strengthening.  Person(s) Educated Patient   Methods Explanation;Demonstration   Comprehension Verbalized understanding             PT Long Term Goals - 2015-02-18 1328    PT LONG TERM GOAL #1   Title pt. will improve quickDASH to 25% to demonstrate improvement with self perceived disability with ADL's for lifting/pushing and overhead activities    Status Achieved   PT LONG TERM GOAL #2   Title pt. will report decreased pain level to max 1-2/10 with aggravating activities    Status Achieved   PT LONG TERM GOAL #3   Title Pt. will have as close to full AROM shoulder to allow improved function with overhead activities without pain in 3 weeks   Status Achieved   PT LONG TERM GOAL #4   Title pt. will be independent with home program  without cuing for pain control, exercise for flexibility, strength to improve ADL function using right UE    Status Achieved   PT LONG TERM GOAL #5   Title pt. will improve quickDASH to 15% or less to demonstrate improvement with ADL's with UE for daily activities at home and work    Status Achieved               Plan - Feb 18, 2015 1155    Clinical Impression Statement Pt demonstrates fully functional AROM with R shoulder. AROM is symmetrical to L shoulder with respect to flexion, abduction, and ER. Mild loss of R shoulder IR as measured by hands behind back/ 3-4 inch difference between R and L shoulders but still fully functional internal rotation range. Quick DASH has decreased to 2.3% impairment today. Pt reports worst pain currently at 1/10. All goals have been met at this time. Pt will be discharged due to having met all of her goals. Pt educated regarding continuation of HEP and instructions to call if problems arise.    Consulted and Agree with Plan of Care Patient          G-Codes - 2015-02-18 1329    Functional Limitation Carrying, moving and handling objects   Carrying, Moving and Handling Objects Goal Status [phone]) At least 1 percent but less than 20 percent impaired, limited or restricted   Carrying, Moving and Handling Objects Discharge Status [phone]) At least 1 percent but less than 20 percent impaired, limited or restricted      Problem List Patient Active Problem List   Diagnosis Date Noted  . Right shoulder pain 01/06/2015  . Osteoarthritis of both knees 01/06/2015  . Medicare annual wellness visit, initial 04/07/2014  . Need for prophylactic vaccination and inoculation against influenza 07/21/2013  . Obesity (BMI 30-39.9) 07/21/2013  . Screening for breast cancer 07/16/2012  . Anxiety and depression 07/16/2012  . GERD (gastroesophageal reflux disease) 07/16/2012  . Hypothyroidism 01/15/2012  . Pure hypercholesterolemia 12/12/2011  . Allergy   . Endometriosis    . Carpal tunnel syndrome   . Degenerative joint disease   . Hypertension 07/18/2011  . Allergic rhinitis 07/18/2011   PHYSICAL THERAPY DISCHARGE SUMMARY  Visits from Start of Care: 9  Current functional level related to goals / functional outcomes: See above   Remaining deficits: Mild loss of R shoulder IR. Maximum 1/10 R shoulder pain with aggravating activities. Quick DASH: 2.3% impairment   Education / Equipment: Pt has bands at home and recently joined a gym.  Plan: Patient agrees to discharge.  Patient goals were met. Patient is being discharged due to meeting the stated  rehab goals. ???        [place] 02/16/2015, 1:47 PM  [facility] [address] [place], [address] Phone: [phone]   Fax:  [phone]

## 2015-02-21 ENCOUNTER — Encounter: Payer: Medicare Other | Admitting: Physical Therapy

## 2015-02-21 ENCOUNTER — Other Ambulatory Visit: Payer: Self-pay | Admitting: Internal Medicine

## 2015-02-24 ENCOUNTER — Encounter: Payer: Medicare Other | Admitting: Physical Therapy

## 2015-03-01 ENCOUNTER — Encounter: Payer: Medicare Other | Admitting: Physical Therapy

## 2015-03-03 ENCOUNTER — Encounter: Payer: Medicare Other | Admitting: Physical Therapy

## 2015-03-15 ENCOUNTER — Other Ambulatory Visit: Payer: Self-pay | Admitting: Internal Medicine

## 2015-03-15 NOTE — Telephone Encounter (Signed)
Last OV 4.7.16, last refill 3.15.16.  Please advise refill

## 2015-04-13 DIAGNOSIS — M5416 Radiculopathy, lumbar region: Secondary | ICD-10-CM | POA: Diagnosis not present

## 2015-04-13 DIAGNOSIS — M533 Sacrococcygeal disorders, not elsewhere classified: Secondary | ICD-10-CM | POA: Diagnosis not present

## 2015-04-13 DIAGNOSIS — G8929 Other chronic pain: Secondary | ICD-10-CM | POA: Diagnosis not present

## 2015-04-14 ENCOUNTER — Encounter: Payer: Medicare Other | Admitting: Internal Medicine

## 2015-04-18 DIAGNOSIS — M546 Pain in thoracic spine: Secondary | ICD-10-CM | POA: Diagnosis not present

## 2015-04-20 DIAGNOSIS — M546 Pain in thoracic spine: Secondary | ICD-10-CM | POA: Diagnosis not present

## 2015-04-22 DIAGNOSIS — M546 Pain in thoracic spine: Secondary | ICD-10-CM | POA: Diagnosis not present

## 2015-05-09 DIAGNOSIS — M546 Pain in thoracic spine: Secondary | ICD-10-CM | POA: Diagnosis not present

## 2015-05-17 DIAGNOSIS — M546 Pain in thoracic spine: Secondary | ICD-10-CM | POA: Diagnosis not present

## 2015-05-26 DIAGNOSIS — M546 Pain in thoracic spine: Secondary | ICD-10-CM | POA: Diagnosis not present

## 2015-05-29 ENCOUNTER — Encounter: Payer: Self-pay | Admitting: Internal Medicine

## 2015-05-29 ENCOUNTER — Other Ambulatory Visit: Payer: Self-pay | Admitting: Internal Medicine

## 2015-05-29 DIAGNOSIS — Z0000 Encounter for general adult medical examination without abnormal findings: Secondary | ICD-10-CM

## 2015-05-30 ENCOUNTER — Ambulatory Visit: Payer: PRIVATE HEALTH INSURANCE

## 2015-05-31 ENCOUNTER — Telehealth: Payer: Self-pay | Admitting: *Deleted

## 2015-05-31 ENCOUNTER — Encounter: Payer: Self-pay | Admitting: Internal Medicine

## 2015-05-31 ENCOUNTER — Ambulatory Visit: Payer: PRIVATE HEALTH INSURANCE

## 2015-05-31 ENCOUNTER — Ambulatory Visit (INDEPENDENT_AMBULATORY_CARE_PROVIDER_SITE_OTHER): Payer: Medicare Other | Admitting: Internal Medicine

## 2015-05-31 ENCOUNTER — Other Ambulatory Visit: Payer: Self-pay | Admitting: *Deleted

## 2015-05-31 VITALS — BP 126/75 | HR 88 | Temp 98.8°F | Ht 62.5 in | Wt 179.5 lb

## 2015-05-31 DIAGNOSIS — J209 Acute bronchitis, unspecified: Secondary | ICD-10-CM | POA: Insufficient documentation

## 2015-05-31 MED ORDER — AZITHROMYCIN 250 MG PO TABS: ORAL_TABLET | ORAL | Status: DC

## 2015-05-31 MED ORDER — HYDROCOD POLST-CPM POLST ER 10-8 MG/5ML PO SUER: 5.0000 mL | Freq: Two times a day (BID) | ORAL | Status: DC | PRN

## 2015-05-31 MED ORDER — PREDNISONE 10 MG PO TABS: ORAL_TABLET | ORAL | Status: DC

## 2015-05-31 MED ORDER — BUPROPION HCL ER (XL) 300 MG PO TB24: 300.0000 mg | ORAL_TABLET | Freq: Every day | ORAL | Status: DC

## 2015-05-31 NOTE — Progress Notes (Signed)
Subjective:    Patient ID: Stacy Santana, female    DOB: 1951/03/13, 64 y.o.   MRN: [id number]  HPI  64YO female presents for acute visit.  Cough - Started 2 weeks ago after plane ride with cough, low grade temps. Fever resolved, however non-productive cough persisted. Only rare white mucous. Occasional dyspnea, but able to walk dog without issues. Not taking anything right now for this. Episode 2 years ago with severe bronchitis which was persistent.   Past Medical History  Diagnosis Date  . Hypertension   . Glaucoma     left eye, Dr. Morrison Old in [place]  . Osteoporosis     osteopenia, Fosamax then Boniva 2009  . Vitamin D deficiency     on supplements  . Allergy     seasonal  . Endometriosis 1989    small amount  . HSV infection   . Carpal tunnel syndrome   . Degenerative joint disease   . Depression    Family History  Problem Relation Age of Onset  . Osteoporosis Mother     secondary to steroids  . Rheum arthritis Mother   . Hypertension Mother   . COPD Mother   . Arthritis Mother   . Stroke Mother   . Cancer Father     prostate  . Hypertrophic cardiomyopathy Sister   . Schizophrenia Paternal Aunt   . Heart disease Maternal Grandmother   . COPD Maternal Grandfather   . Depression Son   . Anxiety disorder Son   . ADD / ADHD Son   . Depression Maternal Uncle   . ADD / ADHD Son    Past Surgical History  Procedure Laterality Date  . Nasal sinus surgery  2012    Dr. Pryor Ochoa  . Laparoscopy  1999  . Appendectomy  1964    open  . Tonsillectomy    . Tubal ligation  1992  . Carpal tunnel release Right 01/2014    Dr. Malvin Johns   Social History   Social History  . Marital Status: Married    Spouse Name: N/A  . Number of Children: N/A  . Years of Education: N/A   Social History Main Topics  . Smoking status: Former Smoker -- 1.00 packs/day for 16 years    Types: Cigarettes    Quit date: 10/02/1983  . Smokeless tobacco: Never Used  . Alcohol Use: Yes   Comment: occassionally  . Drug Use: No  . Sexual Activity: Not Asked   Other Topics Concern  . None   Social History Narrative    Review of Systems  Constitutional: Positive for fever and chills. Negative for unexpected weight change.  HENT: Positive for congestion. Negative for ear discharge, ear pain, facial swelling, hearing loss, mouth sores, nosebleeds, postnasal drip, rhinorrhea, sinus pressure, sneezing, sore throat, tinnitus, trouble swallowing and voice change.   Eyes: Negative for pain, discharge, redness and visual disturbance.  Respiratory: Positive for cough and chest tightness. Negative for shortness of breath, wheezing and stridor.   Cardiovascular: Negative for chest pain, palpitations and leg swelling.  Musculoskeletal: Negative for myalgias, arthralgias, neck pain and neck stiffness.  Skin: Negative for color change and rash.  Neurological: Negative for dizziness, weakness, light-headedness and headaches.  Hematological: Negative for adenopathy.  Psychiatric/Behavioral: Positive for sleep disturbance.       Objective:    BP 126/75 mmHg  Pulse 88  Temp(Src) 98.8 F (37.1 C) (Oral)  Ht 5' 2.5" (1.588 m)  Wt 179 lb 8 oz (81.421  kg)  BMI 32.29 kg/m2  SpO2 98% Physical Exam  Constitutional: She is oriented to person, place, and time. She appears well-developed and well-nourished. No distress.  HENT:  Head: Normocephalic and atraumatic.  Right Ear: External ear normal.  Left Ear: External ear normal.  Nose: Nose normal.  Mouth/Throat: Oropharynx is clear and moist. No oropharyngeal exudate.  Eyes: Conjunctivae are normal. Pupils are equal, round, and reactive to light. Right eye exhibits no discharge. Left eye exhibits no discharge. No scleral icterus.  Neck: Normal range of motion. Neck supple. No tracheal deviation present. No thyromegaly present.  Cardiovascular: Normal rate, regular rhythm, normal heart sounds and intact distal pulses.  Exam reveals no  gallop and no friction rub.   No murmur heard. Pulmonary/Chest: Effort normal. No accessory muscle usage. No tachypnea. No respiratory distress. She has decreased breath sounds. She has wheezes (few scattered). She has rhonchi (few scattered). She has no rales. She exhibits no tenderness.  Musculoskeletal: Normal range of motion. She exhibits no edema or tenderness.  Lymphadenopathy:    She has no cervical adenopathy.  Neurological: She is alert and oriented to person, place, and time. No cranial nerve deficit. She exhibits normal muscle tone. Coordination normal.  Skin: Skin is warm and dry. No rash noted. She is not diaphoretic. No erythema. No pallor.  Psychiatric: She has a normal mood and affect. Her behavior is normal. Judgment and thought content normal.          Assessment & Plan:   Problem List Items Addressed This Visit      Unprioritized   Acute bronchitis - Primary    Symptoms and exam c/w acute bronchitis. Will start Prednisone taper and Azithromycin. Tussionex as needed for cough. Follow up by email Thursday and in visit in 1 week. CXR this week if no improvement.      Relevant Medications   predniSONE (DELTASONE) 10 MG tablet   chlorpheniramine-HYDROcodone (TUSSIONEX PENNKINETIC ER) 10-8 MG/5ML SUER   azithromycin (ZITHROMAX Z-PAK) 250 MG tablet       Return in about 2 weeks (around 06/14/2015).

## 2015-05-31 NOTE — Assessment & Plan Note (Signed)
Symptoms and exam c/w acute bronchitis. Will start Prednisone taper and Azithromycin. Tussionex as needed for cough. Follow up by email Thursday and in visit in 1 week. CXR this week if no improvement.

## 2015-05-31 NOTE — Telephone Encounter (Signed)
Can we use Robitussin-AC?

## 2015-05-31 NOTE — Progress Notes (Signed)
Pre visit review using our clinic review tool, if applicable. No additional management support is needed unless otherwise documented below in the visit note. 

## 2015-05-31 NOTE — Telephone Encounter (Signed)
Pharmacist called states Tussionex is not covered by Medicare Part D.  Please advise alternative medication.

## 2015-05-31 NOTE — Patient Instructions (Signed)
Start Prednisone taper and Azithromycin.  Use Tussionex as needed for cough.  Follow up in 2 weeks.  Call or email with update Thursday morning.

## 2015-06-01 ENCOUNTER — Encounter: Payer: PRIVATE HEALTH INSURANCE | Admitting: Internal Medicine

## 2015-06-02 ENCOUNTER — Encounter: Payer: Self-pay | Admitting: Internal Medicine

## 2015-06-13 ENCOUNTER — Other Ambulatory Visit (INDEPENDENT_AMBULATORY_CARE_PROVIDER_SITE_OTHER): Payer: Medicare Other

## 2015-06-13 DIAGNOSIS — E039 Hypothyroidism, unspecified: Secondary | ICD-10-CM

## 2015-06-13 DIAGNOSIS — R7402 Elevation of levels of lactic acid dehydrogenase (LDH): Secondary | ICD-10-CM

## 2015-06-13 DIAGNOSIS — R740 Nonspecific elevation of levels of transaminase and lactic acid dehydrogenase [LDH]: Secondary | ICD-10-CM

## 2015-06-13 DIAGNOSIS — I10 Essential (primary) hypertension: Secondary | ICD-10-CM | POA: Diagnosis not present

## 2015-06-13 DIAGNOSIS — Z0000 Encounter for general adult medical examination without abnormal findings: Secondary | ICD-10-CM | POA: Diagnosis not present

## 2015-06-13 LAB — COMPREHENSIVE METABOLIC PANEL
ALBUMIN: 4.3 g/dL (ref 3.5–5.2)
ALK PHOS: 61 U/L (ref 39–117)
ALT: 29 U/L (ref 0–35)
AST: 23 U/L (ref 0–37)
BILIRUBIN TOTAL: 0.5 mg/dL (ref 0.2–1.2)
BUN: 11 mg/dL (ref 6–23)
CALCIUM: 9.8 mg/dL (ref 8.4–10.5)
CO2: 28 mEq/L (ref 19–32)
Chloride: 100 mEq/L (ref 96–112)
Creatinine, Ser: 0.97 mg/dL (ref 0.40–1.20)
GFR: 60.99 mL/min (ref >60.00)
Glucose, Bld: 85 mg/dL (ref 70–99)
Potassium: 3.4 mEq/L — ABNORMAL LOW (ref 3.5–5.1)
Sodium: 138 mEq/L (ref 135–145)
TOTAL PROTEIN: 7.2 g/dL (ref 6.0–8.3)

## 2015-06-13 LAB — LIPID PANEL
CHOLESTEROL: 180 mg/dL (ref 0–200)
HDL: 49.3 mg/dL (ref >39.00)
NonHDL: 130.29
TRIGLYCERIDES: 216 mg/dL — AB (ref 0.0–149.0)
Total CHOL/HDL Ratio: 4
VLDL: 43.2 mg/dL — ABNORMAL HIGH (ref 0.0–40.0)

## 2015-06-13 LAB — CBC WITH DIFFERENTIAL/PLATELET
BASOS ABS: 0 10*3/uL (ref 0.0–0.1)
Basophils Relative: 0.3 % (ref 0.0–3.0)
Eosinophils Absolute: 0.3 10*3/uL (ref 0.0–0.7)
Eosinophils Relative: 3.8 % (ref 0.0–5.0)
HEMATOCRIT: 38.3 % (ref 36.0–46.0)
Hemoglobin: 13.1 g/dL (ref 12.0–15.0)
LYMPHS PCT: 21.3 % (ref 12.0–46.0)
Lymphs Abs: 1.6 10*3/uL (ref 0.7–4.0)
MCHC: 34.1 g/dL (ref 30.0–36.0)
MCV: 87.1 fl (ref 78.0–100.0)
MONOS PCT: 7.8 % (ref 3.0–12.0)
Monocytes Absolute: 0.6 10*3/uL (ref 0.1–1.0)
NEUTROS ABS: 5 10*3/uL (ref 1.4–7.7)
Neutrophils Relative %: 66.8 % (ref 43.0–77.0)
PLATELETS: 290 10*3/uL (ref 150.0–400.0)
RBC: 4.4 Mil/uL (ref 3.87–5.11)
RDW: 13.8 % (ref 11.5–15.5)
WBC: 7.5 10*3/uL (ref 4.0–10.5)

## 2015-06-13 LAB — MICROALBUMIN / CREATININE URINE RATIO
Creatinine,U: 139.7 mg/dL
MICROALB/CREAT RATIO: 0.9 mg/g (ref 0.0–30.0)
Microalb, Ur: 1.3 mg/dL (ref 0.0–1.9)

## 2015-06-13 LAB — TSH: TSH: 2.39 u[IU]/mL (ref 0.35–4.50)

## 2015-06-13 LAB — VITAMIN B12: VITAMIN B 12: 483 pg/mL (ref 211–911)

## 2015-06-13 LAB — VITAMIN D 25 HYDROXY (VIT D DEFICIENCY, FRACTURES): VITD: 37.95 ng/mL (ref 30.00–100.00)

## 2015-06-13 LAB — LDL CHOLESTEROL, DIRECT: LDL DIRECT: 101 mg/dL

## 2015-06-14 ENCOUNTER — Encounter: Payer: PRIVATE HEALTH INSURANCE | Admitting: Internal Medicine

## 2015-06-20 ENCOUNTER — Encounter: Payer: Self-pay | Admitting: Internal Medicine

## 2015-06-20 ENCOUNTER — Ambulatory Visit (INDEPENDENT_AMBULATORY_CARE_PROVIDER_SITE_OTHER): Payer: Medicare Other | Admitting: Internal Medicine

## 2015-06-20 VITALS — BP 149/64 | HR 104 | Temp 98.9°F | Ht 61.0 in | Wt 181.0 lb

## 2015-06-20 DIAGNOSIS — E039 Hypothyroidism, unspecified: Secondary | ICD-10-CM

## 2015-06-20 DIAGNOSIS — J209 Acute bronchitis, unspecified: Secondary | ICD-10-CM

## 2015-06-20 DIAGNOSIS — I10 Essential (primary) hypertension: Secondary | ICD-10-CM | POA: Diagnosis not present

## 2015-06-20 NOTE — Patient Instructions (Addendum)
  Please have chest xray at [place].  Follow up in 4 weeks.

## 2015-06-20 NOTE — Assessment & Plan Note (Addendum)
Persistent symptoms of cough despite treatment with azithromycin. Suspect pertussis. Continue supportive care. Will get CXR today. Consider PFTs if symptoms persist.

## 2015-06-20 NOTE — Assessment & Plan Note (Signed)
Recent TSH normal. Continue Levothyroxine.

## 2015-06-20 NOTE — Assessment & Plan Note (Signed)
BP Readings from Last 3 Encounters:  06/20/15 149/64  05/31/15 126/75  01/06/15 120/79   BP well controlled generally. Elevated today, however she is upset and coughing. Will recheck and monitor, has Wellness Visit tomorrow.

## 2015-06-20 NOTE — Progress Notes (Signed)
Subjective:    Patient ID: Stacy Santana, female    DOB: June 15, 1951, 64 y.o.   MRN: [id number]  HPI  64YO female presents for follow up.  Cough - Persistent cough > 1 month. Treated for possible pertussis and bronchitis with azithromycin last visit. Some improvement. No fever, chills, dyspnea, however continues to have irritating cough daily. Generally non-productive.  Aside from this, feeling well. Compliant with medications. Recent labs stable.  BP as been well controlled by report. No CP, HA, palpitations.   BP Readings from Last 3 Encounters:  06/20/15 149/64  05/31/15 126/75  01/06/15 120/79   Wt Readings from Last 3 Encounters:  06/20/15 181 lb (82.101 kg)  05/31/15 179 lb 8 oz (81.421 kg)  01/06/15 191 lb 6 oz (86.807 kg)     Past Medical History  Diagnosis Date  . Hypertension   . Glaucoma     left eye, Dr. Morrison Old in [place]  . Osteoporosis     osteopenia, Fosamax then Boniva 2009  . Vitamin D deficiency     on supplements  . Allergy     seasonal  . Endometriosis 1989    small amount  . HSV infection   . Carpal tunnel syndrome   . Degenerative joint disease   . Depression    Family History  Problem Relation Age of Onset  . Osteoporosis Mother     secondary to steroids  . Rheum arthritis Mother   . Hypertension Mother   . COPD Mother   . Arthritis Mother   . Stroke Mother   . Cancer Father     prostate  . Hypertrophic cardiomyopathy Sister   . Schizophrenia Paternal Aunt   . Heart disease Maternal Grandmother   . COPD Maternal Grandfather   . Depression Son   . Anxiety disorder Son   . ADD / ADHD Son   . Depression Maternal Uncle   . ADD / ADHD Son    Past Surgical History  Procedure Laterality Date  . Nasal sinus surgery  2012    Dr. Pryor Ochoa  . Laparoscopy  1999  . Appendectomy  1964    open  . Tonsillectomy    . Tubal ligation  1992  . Carpal tunnel release Right 01/2014    Dr. Malvin Johns   Social History   Social History  . Marital  Status: Married    Spouse Name: N/A  . Number of Children: N/A  . Years of Education: N/A   Social History Main Topics  . Smoking status: Former Smoker -- 1.00 packs/day for 16 years    Types: Cigarettes    Quit date: 10/02/1983  . Smokeless tobacco: Never Used  . Alcohol Use: Yes     Comment: occassionally  . Drug Use: No  . Sexual Activity: Not Asked   Other Topics Concern  . None   Social History Narrative    Review of Systems  Constitutional: Negative for fever, chills, appetite change, fatigue and unexpected weight change.  Eyes: Negative for visual disturbance.  Respiratory: Positive for cough. Negative for chest tightness, shortness of breath and wheezing.   Cardiovascular: Negative for chest pain and leg swelling.  Gastrointestinal: Negative for nausea, vomiting, abdominal pain, diarrhea and constipation.  Skin: Negative for color change and rash.  Hematological: Negative for adenopathy. Does not bruise/bleed easily.  Psychiatric/Behavioral: Negative for dysphoric mood. The patient is not nervous/anxious.        Objective:    BP 149/64 mmHg  Pulse 104  Temp(Src) 98.9 F (37.2 C) (Oral)  Ht '5\' 1"'$  (1.549 m)  Wt 181 lb (82.101 kg)  BMI 34.22 kg/m2  SpO2 98% Physical Exam  Constitutional: She is oriented to person, place, and time. She appears well-developed and well-nourished. No distress.  HENT:  Head: Normocephalic and atraumatic.  Right Ear: External ear normal.  Left Ear: External ear normal.  Nose: Nose normal.  Mouth/Throat: Oropharynx is clear and moist. No oropharyngeal exudate.  Eyes: Conjunctivae are normal. Pupils are equal, round, and reactive to light. Right eye exhibits no discharge. Left eye exhibits no discharge. No scleral icterus.  Neck: Normal range of motion. Neck supple. No tracheal deviation present. No thyromegaly present.  Cardiovascular: Normal rate, regular rhythm, normal heart sounds and intact distal pulses.  Exam reveals no gallop  and no friction rub.   No murmur heard. Pulmonary/Chest: Effort normal. No accessory muscle usage. No respiratory distress. She has no decreased breath sounds. She has no wheezes. She has rhonchi (scattered). She has no rales. She exhibits no tenderness.  Musculoskeletal: Normal range of motion. She exhibits no edema or tenderness.  Lymphadenopathy:    She has no cervical adenopathy.  Neurological: She is alert and oriented to person, place, and time. No cranial nerve deficit. She exhibits normal muscle tone. Coordination normal.  Skin: Skin is warm and dry. No rash noted. She is not diaphoretic. No erythema. No pallor.  Psychiatric: She has a normal mood and affect. Her behavior is normal. Judgment and thought content normal.          Assessment & Plan:   Problem List Items Addressed This Visit      Unprioritized   Acute bronchitis - Primary    Persistent symptoms of cough despite treatment with azithromycin. Suspect pertussis. Continue supportive care. Will get CXR today. Consider PFTs if symptoms persist.      Relevant Orders   DG Chest 2 View   Hypertension    BP Readings from Last 3 Encounters:  06/20/15 149/64  05/31/15 126/75  01/06/15 120/79   BP well controlled generally. Elevated today, however she is upset and coughing. Will recheck and monitor, has Wellness Visit tomorrow.      Hypothyroidism    Recent TSH normal. Continue Levothyroxine.          Return in about 4 weeks (around 07/18/2015).

## 2015-06-20 NOTE — Progress Notes (Signed)
Pre visit review using our clinic review tool, if applicable. No additional management support is needed unless otherwise documented below in the visit note. 

## 2015-06-21 ENCOUNTER — Ambulatory Visit: Payer: PRIVATE HEALTH INSURANCE

## 2015-06-21 ENCOUNTER — Other Ambulatory Visit: Payer: Self-pay

## 2015-06-21 ENCOUNTER — Ambulatory Visit (INDEPENDENT_AMBULATORY_CARE_PROVIDER_SITE_OTHER): Payer: Medicare Other

## 2015-06-21 ENCOUNTER — Other Ambulatory Visit: Payer: Self-pay | Admitting: *Deleted

## 2015-06-21 VITALS — BP 128/70 | HR 65 | Temp 98.1°F | Resp 12 | Ht 62.0 in | Wt 180.0 lb

## 2015-06-21 DIAGNOSIS — Z299 Encounter for prophylactic measures, unspecified: Secondary | ICD-10-CM

## 2015-06-21 DIAGNOSIS — Z0000 Encounter for general adult medical examination without abnormal findings: Secondary | ICD-10-CM

## 2015-06-21 DIAGNOSIS — Z23 Encounter for immunization: Secondary | ICD-10-CM | POA: Diagnosis not present

## 2015-06-21 DIAGNOSIS — Z418 Encounter for other procedures for purposes other than remedying health state: Secondary | ICD-10-CM | POA: Diagnosis not present

## 2015-06-21 DIAGNOSIS — R918 Other nonspecific abnormal finding of lung field: Secondary | ICD-10-CM | POA: Diagnosis not present

## 2015-06-21 DIAGNOSIS — Z1231 Encounter for screening mammogram for malignant neoplasm of breast: Secondary | ICD-10-CM | POA: Diagnosis not present

## 2015-06-21 DIAGNOSIS — R05 Cough: Secondary | ICD-10-CM | POA: Diagnosis not present

## 2015-06-21 NOTE — Patient Instructions (Addendum)
Stacy Santana,  Thank you for taking time to come for your Medicare Wellness Visit.  I appreciate your ongoing commitment to your health goals. Please review the following plan we discussed and let me know if I can assist you in the future.  Bring a copy of your HCPOA to be scannned.  Influenza administered today.  Pneumococcal 23 administered today.   Hep C screening completed today.

## 2015-06-21 NOTE — Progress Notes (Signed)
Subjective:   Stacy Santana is a 64 y.o. female who presents for Medicare Annual (Subsequent) preventive examination.  Review of Systems:  No ROS.  Medicare Wellness Visit. Cardiac Risk Factors include: advanced age (>60mn, >>92women)     Objective:    The goal of the wellness visit is to assist the patient how to close the gaps in care and create a preventative care plan for the patient. This was a routine visit for Stacy Santana  Vitals: BP 128/70 mmHg  Pulse 65  Temp(Src) 98.1 F (36.7 C) (Oral)  Resp 12  Ht '5\' 2"'$  (1.575 m)  Wt 180 lb (81.647 kg)  BMI 32.91 kg/m2  SpO2 97%  Tobacco History  Smoking status  . Former Smoker -- 1.00 packs/day for 16 years  . Types: Cigarettes  . Quit date: 10/02/1983  Smokeless tobacco  . Never Used     Counseling given: Not Answered   Past Medical History  Diagnosis Date  . Hypertension   . Glaucoma     [place] . Osteoporosis     osteopenia, Fosamax then Boniva 2009  . Vitamin D deficiency     on supplements  . Allergy     seasonal  . Endometriosis 1989    small amount  . HSV infection   . Carpal tunnel syndrome   . Degenerative joint disease   . Depression    Past Surgical History  Procedure Laterality Date  . Nasal sinus surgery  2012    Dr. VPryor Ochoa . Laparoscopy  1999  . Appendectomy  1964    open  . Tonsillectomy    . Tubal ligation  1992  . Carpal tunnel release Right 01/2014    Dr. BMalvin Johns  Family History  Problem Relation Age of Onset  . Osteoporosis Mother     secondary to steroids  . Rheum arthritis Mother   . Hypertension Mother   . COPD Mother   . Arthritis Mother   . Stroke Mother   . Cancer Father     prostate  . Hypertrophic cardiomyopathy Sister   . Schizophrenia Paternal Aunt   . Heart disease Maternal Grandmother   . COPD Maternal Grandfather   . Depression Son   . Anxiety disorder Son   . ADD / ADHD Son   . Depression Maternal Uncle   . ADD / ADHD Son     History  Sexual Activity  . Sexual Activity: Yes    Outpatient Encounter Prescriptions as of 06/21/2015  Medication Sig  . amLODipine (NORVASC) 5 MG tablet Take 1 tablet (5 mg total) by mouth daily.  .Marland Kitchenazelastine (ASTELIN) 0.1 % nasal spray Place 1 spray into both nostrils at bedtime as needed. Use in each nostril as directed  . buPROPion (WELLBUTRIN XL) 300 MG 24 hr tablet Take 1 tablet (300 mg total) by mouth daily.  . Cholecalciferol (VITAMIN D PO) Take 1 tablet by mouth daily.    .Marland Kitchenloratadine (CLARITIN) 10 MG tablet Take 10 mg by mouth daily.    . Multiple Vitamin (MULTIVITAMIN) tablet Take 1 tablet by mouth daily.  . Naproxen Sodium 220 MG CAPS Take 2 capsules by mouth daily.    . Omega-3 Fatty Acids (FISH OIL PO) Take 1 capsule by mouth daily.    . Probiotic Product (PROBIOTIC DAILY PO) Take by mouth.  . travoprost, benzalkonium, (TRAVATAN) 0.004 % ophthalmic solution Place 2 drops into both eyes at bedtime.  . triamterene-hydrochlorothiazide (  MAXZIDE-25) 37.5-25 MG per tablet TAKE 1 TABLET BY MOUTH EVERY DAY  . levothyroxine (SYNTHROID) 50 MCG tablet Take 1 tablet (50 mcg total) by mouth daily.   No facility-administered encounter medications on file as of 06/21/2015.    Activities of Daily Living In your present state of health, do you have any difficulty performing the following activities: 06/21/2015  Hearing? N  Vision? N  Difficulty concentrating or making decisions? N  Walking or climbing stairs? N  Dressing or bathing? N  Doing errands, shopping? N  Preparing Food and eating ? N  Using the Toilet? N  In the past six months, have you accidently leaked urine? N  Do you have problems with loss of bowel control? N  Managing your Medications? N  Managing your Finances? N  Housekeeping or managing your Housekeeping? N    Patient Care Team: Jackolyn Confer, MD as PCP - General (Internal Medicine)    Assessment:    Bone Density/Risk for Osteoporosis reviewed;  taking Calcium as prescribed.  Taking medications as prescribed and without issues.    Influenza vaccine administered and tolerated well.  Pneumococcal 23 administered and tolerated well.  Hepatitis C screening completed.  Safety issues reviewed; smoke detectors in the home. Firearms in a locked case in the home. Wears seatbelts when driving or riding with others. No violence in the home.  The patient was oriented x 3; appropriate in dress and manner and no objective failures at ADL's or IADL's.   Ophthalmologist- Followed by Dr. Kathyrn Lass, [place]. Last exam 09/2015.   End of life planning was discussed; Advanced care planning discussed; plans to return copy of HCPOA and Living Will.  Patient concerns:  Word finding/searching during conversation at times with others.  Noticed 1 year ago.  Deferred to PCP to follow up.   Exercise Activities and Dietary recommendations Current Exercise Habits:: The patient does not participate in regular exercise at present  Goals    . Increase physical activity     Joined [employer] [facility], but not currently attending.  Patient centered goal is to attend the gym more frequently with a personal trainer and work on core strengthening exercises 3 times weekly for 30 minute sessions.      Fall Risk Fall Risk  06/21/2015 10/11/2014 04/07/2014 04/07/2014  Falls in the past year? No No No No   Depression Screen PHQ 2/9 Scores 06/21/2015 10/11/2014 04/07/2014 04/07/2014  PHQ - 2 Score 0 0 0 0     Cognitive Testing MMSE - Mini Mental State Exam 06/21/2015  Orientation to time 5  Orientation to Place 5  Registration 3  Attention/ Calculation 5  Recall 3  Language- name 2 objects 2  Language- repeat 1  Language- follow 3 step command 3  Language- read & follow direction 1  Write a sentence 1  Copy design 1  Total score 30    Immunization History  Administered Date(s) Administered  . Influenza Whole 06/18/2011, 06/16/2012  .  Influenza,inj,Quad PF,36+ Mos 07/21/2013, 06/21/2015  . Influenza-Unspecified 07/20/2014  . Pneumococcal Conjugate-13 04/07/2014  . Pneumococcal Polysaccharide-23 06/21/2015  . Tdap 07/17/2009  . Zoster 03/18/2011   Screening Tests Health Maintenance  Topic Date Due  . Hepatitis C Screening  10-28-1950  . INFLUENZA VACCINE  05/02/2015  . MAMMOGRAM  09/08/2015  . TETANUS/TDAP  07/18/2019  . COLONOSCOPY  10/04/2019  . DEXA SCAN  Completed  . ZOSTAVAX  Completed  . PNA vac Low Risk Adult  Completed  Plan:     During the course of the visit the patient was educated and counseled about the following appropriate screening and preventive services:   Vaccines to include Pneumoccal, Influenza, Hepatitis B, Td, Zostavax, HCV  Electrocardiogram  Cardiovascular Disease  Colorectal cancer screening  Bone density screening  Diabetes screening  Glaucoma screening  Mammography/PAP  Nutrition counseling   Patient Instructions (the written plan) was given to the patient. Patient to follow up in 1 year unless the need for medical attention arises.  Future appointment made and future labs placed.  Varney Biles, LPN  [id number]

## 2015-06-21 NOTE — Progress Notes (Signed)
Annual Wellness Visit as completed by [facility] was reviewed in full.  

## 2015-06-22 ENCOUNTER — Other Ambulatory Visit: Payer: Self-pay | Admitting: Internal Medicine

## 2015-06-22 DIAGNOSIS — R9389 Abnormal findings on diagnostic imaging of other specified body structures: Secondary | ICD-10-CM

## 2015-06-22 LAB — HEPATITIS C ANTIBODY: HCV Ab: NEGATIVE

## 2015-06-23 ENCOUNTER — Encounter: Payer: Self-pay | Admitting: Internal Medicine

## 2015-06-24 ENCOUNTER — Encounter: Payer: Self-pay | Admitting: Internal Medicine

## 2015-06-24 DIAGNOSIS — R918 Other nonspecific abnormal finding of lung field: Secondary | ICD-10-CM | POA: Diagnosis not present

## 2015-06-24 DIAGNOSIS — R938 Abnormal findings on diagnostic imaging of other specified body structures: Secondary | ICD-10-CM | POA: Diagnosis not present

## 2015-06-24 DIAGNOSIS — R911 Solitary pulmonary nodule: Secondary | ICD-10-CM | POA: Diagnosis not present

## 2015-06-28 ENCOUNTER — Telehealth: Payer: Self-pay | Admitting: Internal Medicine

## 2015-06-28 DIAGNOSIS — R9389 Abnormal findings on diagnostic imaging of other specified body structures: Secondary | ICD-10-CM

## 2015-06-28 NOTE — Telephone Encounter (Signed)
Follow up CT scan

## 2015-06-28 NOTE — Addendum Note (Signed)
Addended by: Ronette Deter A on: 06/28/2015 10:28 AM   Modules accepted: Orders

## 2015-06-30 ENCOUNTER — Encounter: Payer: Self-pay | Admitting: Internal Medicine

## 2015-07-06 MED ORDER — LEVOFLOXACIN 500 MG PO TABS: 500.0000 mg | ORAL_TABLET | Freq: Every day | ORAL | Status: DC

## 2015-07-06 NOTE — Addendum Note (Signed)
Addended by: Ronette Deter A on: 07/06/2015 08:30 AM   Modules accepted: Orders

## 2015-07-22 ENCOUNTER — Encounter: Payer: Self-pay | Admitting: Internal Medicine

## 2015-08-03 DIAGNOSIS — J189 Pneumonia, unspecified organism: Secondary | ICD-10-CM | POA: Diagnosis not present

## 2015-08-03 DIAGNOSIS — R0602 Shortness of breath: Secondary | ICD-10-CM | POA: Diagnosis not present

## 2015-08-03 DIAGNOSIS — F329 Major depressive disorder, single episode, unspecified: Secondary | ICD-10-CM | POA: Diagnosis not present

## 2015-08-03 DIAGNOSIS — Z885 Allergy status to narcotic agent status: Secondary | ICD-10-CM | POA: Diagnosis not present

## 2015-08-03 DIAGNOSIS — Z87891 Personal history of nicotine dependence: Secondary | ICD-10-CM | POA: Diagnosis not present

## 2015-08-03 DIAGNOSIS — Z7952 Long term (current) use of systemic steroids: Secondary | ICD-10-CM | POA: Diagnosis not present

## 2015-08-03 DIAGNOSIS — R938 Abnormal findings on diagnostic imaging of other specified body structures: Secondary | ICD-10-CM | POA: Diagnosis not present

## 2015-08-03 DIAGNOSIS — E039 Hypothyroidism, unspecified: Secondary | ICD-10-CM | POA: Diagnosis not present

## 2015-08-03 DIAGNOSIS — Z8489 Family history of other specified conditions: Secondary | ICD-10-CM | POA: Diagnosis not present

## 2015-08-03 DIAGNOSIS — I10 Essential (primary) hypertension: Secondary | ICD-10-CM | POA: Diagnosis not present

## 2015-08-03 DIAGNOSIS — Z79899 Other long term (current) drug therapy: Secondary | ICD-10-CM | POA: Diagnosis not present

## 2015-08-03 DIAGNOSIS — R05 Cough: Secondary | ICD-10-CM | POA: Diagnosis not present

## 2015-08-03 DIAGNOSIS — Z8261 Family history of arthritis: Secondary | ICD-10-CM | POA: Diagnosis not present

## 2015-08-03 DIAGNOSIS — H409 Unspecified glaucoma: Secondary | ICD-10-CM | POA: Diagnosis not present

## 2015-08-03 DIAGNOSIS — M1990 Unspecified osteoarthritis, unspecified site: Secondary | ICD-10-CM | POA: Diagnosis not present

## 2015-08-03 DIAGNOSIS — Z8249 Family history of ischemic heart disease and other diseases of the circulatory system: Secondary | ICD-10-CM | POA: Diagnosis not present

## 2015-08-04 DIAGNOSIS — J189 Pneumonia, unspecified organism: Secondary | ICD-10-CM | POA: Diagnosis not present

## 2015-08-04 DIAGNOSIS — R918 Other nonspecific abnormal finding of lung field: Secondary | ICD-10-CM | POA: Diagnosis not present

## 2015-08-04 DIAGNOSIS — R911 Solitary pulmonary nodule: Secondary | ICD-10-CM | POA: Diagnosis not present

## 2015-08-08 ENCOUNTER — Telehealth: Payer: Self-pay | Admitting: Internal Medicine

## 2015-08-08 NOTE — Telephone Encounter (Signed)
Follow up Chest CT showed stable left upper lobe mass

## 2015-08-15 ENCOUNTER — Encounter: Payer: Self-pay | Admitting: Internal Medicine

## 2015-08-19 ENCOUNTER — Encounter: Payer: Self-pay | Admitting: Internal Medicine

## 2015-08-31 DIAGNOSIS — M479 Spondylosis, unspecified: Secondary | ICD-10-CM | POA: Diagnosis not present

## 2015-08-31 DIAGNOSIS — Z9889 Other specified postprocedural states: Secondary | ICD-10-CM | POA: Diagnosis not present

## 2015-08-31 DIAGNOSIS — Z049 Encounter for examination and observation for unspecified reason: Secondary | ICD-10-CM | POA: Diagnosis not present

## 2015-08-31 DIAGNOSIS — I10 Essential (primary) hypertension: Secondary | ICD-10-CM | POA: Diagnosis not present

## 2015-08-31 DIAGNOSIS — R918 Other nonspecific abnormal finding of lung field: Secondary | ICD-10-CM | POA: Diagnosis not present

## 2015-08-31 DIAGNOSIS — R911 Solitary pulmonary nodule: Secondary | ICD-10-CM | POA: Diagnosis not present

## 2015-08-31 DIAGNOSIS — Z7952 Long term (current) use of systemic steroids: Secondary | ICD-10-CM | POA: Diagnosis not present

## 2015-08-31 DIAGNOSIS — Z79899 Other long term (current) drug therapy: Secondary | ICD-10-CM | POA: Diagnosis not present

## 2015-08-31 DIAGNOSIS — Z87891 Personal history of nicotine dependence: Secondary | ICD-10-CM | POA: Diagnosis not present

## 2015-08-31 DIAGNOSIS — K219 Gastro-esophageal reflux disease without esophagitis: Secondary | ICD-10-CM | POA: Diagnosis not present

## 2015-08-31 DIAGNOSIS — E039 Hypothyroidism, unspecified: Secondary | ICD-10-CM | POA: Diagnosis not present

## 2015-08-31 DIAGNOSIS — R222 Localized swelling, mass and lump, trunk: Secondary | ICD-10-CM | POA: Diagnosis not present

## 2015-09-01 ENCOUNTER — Other Ambulatory Visit: Payer: Self-pay | Admitting: Internal Medicine

## 2015-09-01 ENCOUNTER — Encounter: Payer: Self-pay | Admitting: Internal Medicine

## 2015-09-14 ENCOUNTER — Other Ambulatory Visit: Payer: Self-pay | Admitting: Internal Medicine

## 2015-10-13 ENCOUNTER — Ambulatory Visit: Payer: PRIVATE HEALTH INSURANCE

## 2015-11-17 ENCOUNTER — Other Ambulatory Visit: Payer: Self-pay | Admitting: Internal Medicine

## 2015-12-14 ENCOUNTER — Other Ambulatory Visit: Payer: Self-pay | Admitting: Internal Medicine

## 2016-01-02 ENCOUNTER — Other Ambulatory Visit: Payer: Self-pay

## 2016-01-02 ENCOUNTER — Other Ambulatory Visit: Payer: Self-pay | Admitting: *Deleted

## 2016-01-02 DIAGNOSIS — M170 Bilateral primary osteoarthritis of knee: Secondary | ICD-10-CM

## 2016-01-02 DIAGNOSIS — I10 Essential (primary) hypertension: Secondary | ICD-10-CM

## 2016-01-02 DIAGNOSIS — Z0000 Encounter for general adult medical examination without abnormal findings: Secondary | ICD-10-CM

## 2016-01-02 DIAGNOSIS — E039 Hypothyroidism, unspecified: Secondary | ICD-10-CM

## 2016-03-07 ENCOUNTER — Encounter: Payer: Self-pay | Admitting: Internal Medicine

## 2016-03-08 DIAGNOSIS — R918 Other nonspecific abnormal finding of lung field: Secondary | ICD-10-CM | POA: Diagnosis not present

## 2016-03-08 DIAGNOSIS — R911 Solitary pulmonary nodule: Secondary | ICD-10-CM | POA: Diagnosis not present

## 2016-03-08 DIAGNOSIS — J189 Pneumonia, unspecified organism: Secondary | ICD-10-CM | POA: Diagnosis not present

## 2016-03-08 DIAGNOSIS — R05 Cough: Secondary | ICD-10-CM | POA: Diagnosis not present

## 2016-03-08 DIAGNOSIS — J40 Bronchitis, not specified as acute or chronic: Secondary | ICD-10-CM | POA: Diagnosis not present

## 2016-03-09 ENCOUNTER — Encounter: Payer: Self-pay | Admitting: Internal Medicine

## 2016-03-15 ENCOUNTER — Encounter: Payer: Self-pay | Admitting: Internal Medicine

## 2016-03-17 ENCOUNTER — Encounter: Payer: Self-pay | Admitting: Internal Medicine

## 2016-03-19 ENCOUNTER — Encounter: Payer: Self-pay | Admitting: Internal Medicine

## 2016-03-21 ENCOUNTER — Encounter: Payer: Self-pay | Admitting: Internal Medicine

## 2016-03-21 DIAGNOSIS — R599 Enlarged lymph nodes, unspecified: Secondary | ICD-10-CM | POA: Diagnosis not present

## 2016-03-21 DIAGNOSIS — C3492 Malignant neoplasm of unspecified part of left bronchus or lung: Secondary | ICD-10-CM | POA: Diagnosis not present

## 2016-03-23 ENCOUNTER — Encounter: Payer: Self-pay | Admitting: Internal Medicine

## 2016-03-26 ENCOUNTER — Encounter: Payer: Self-pay | Admitting: Internal Medicine

## 2016-03-26 ENCOUNTER — Other Ambulatory Visit: Payer: Self-pay | Admitting: Internal Medicine

## 2016-03-26 ENCOUNTER — Telehealth: Payer: Self-pay | Admitting: *Deleted

## 2016-03-26 MED ORDER — CLONAZEPAM 0.5 MG PO TABS: 0.5000 mg | ORAL_TABLET | Freq: Two times a day (BID) | ORAL | Status: DC | PRN

## 2016-03-26 NOTE — Telephone Encounter (Signed)
Patient stated she received a call, possibly in reference to medication.  Pt contact  [phone]

## 2016-03-26 NOTE — Telephone Encounter (Signed)
Medication faxed by Fransisco Beau.  Confirmed.

## 2016-03-26 NOTE — Telephone Encounter (Signed)
Did you try calling this patient?

## 2016-03-27 DIAGNOSIS — R911 Solitary pulmonary nodule: Secondary | ICD-10-CM | POA: Diagnosis not present

## 2016-03-27 DIAGNOSIS — Z79899 Other long term (current) drug therapy: Secondary | ICD-10-CM | POA: Diagnosis not present

## 2016-03-27 DIAGNOSIS — S27321A Contusion of lung, unilateral, initial encounter: Secondary | ICD-10-CM | POA: Diagnosis not present

## 2016-03-27 DIAGNOSIS — H409 Unspecified glaucoma: Secondary | ICD-10-CM | POA: Diagnosis not present

## 2016-03-27 DIAGNOSIS — R0981 Nasal congestion: Secondary | ICD-10-CM | POA: Diagnosis not present

## 2016-03-27 DIAGNOSIS — M1990 Unspecified osteoarthritis, unspecified site: Secondary | ICD-10-CM | POA: Diagnosis not present

## 2016-03-27 DIAGNOSIS — K219 Gastro-esophageal reflux disease without esophagitis: Secondary | ICD-10-CM | POA: Diagnosis not present

## 2016-03-27 DIAGNOSIS — R590 Localized enlarged lymph nodes: Secondary | ICD-10-CM | POA: Diagnosis not present

## 2016-03-27 DIAGNOSIS — Z825 Family history of asthma and other chronic lower respiratory diseases: Secondary | ICD-10-CM | POA: Diagnosis not present

## 2016-03-27 DIAGNOSIS — Z8249 Family history of ischemic heart disease and other diseases of the circulatory system: Secondary | ICD-10-CM | POA: Diagnosis not present

## 2016-03-27 DIAGNOSIS — Z87891 Personal history of nicotine dependence: Secondary | ICD-10-CM | POA: Diagnosis not present

## 2016-03-27 DIAGNOSIS — E039 Hypothyroidism, unspecified: Secondary | ICD-10-CM | POA: Diagnosis not present

## 2016-03-27 DIAGNOSIS — Z885 Allergy status to narcotic agent status: Secondary | ICD-10-CM | POA: Diagnosis not present

## 2016-03-27 DIAGNOSIS — R918 Other nonspecific abnormal finding of lung field: Secondary | ICD-10-CM | POA: Diagnosis not present

## 2016-03-27 DIAGNOSIS — S299XXA Unspecified injury of thorax, initial encounter: Secondary | ICD-10-CM | POA: Diagnosis not present

## 2016-03-27 DIAGNOSIS — I10 Essential (primary) hypertension: Secondary | ICD-10-CM | POA: Diagnosis not present

## 2016-03-27 DIAGNOSIS — F329 Major depressive disorder, single episode, unspecified: Secondary | ICD-10-CM | POA: Diagnosis not present

## 2016-03-27 DIAGNOSIS — Z8261 Family history of arthritis: Secondary | ICD-10-CM | POA: Diagnosis not present

## 2016-03-27 DIAGNOSIS — J984 Other disorders of lung: Secondary | ICD-10-CM | POA: Diagnosis not present

## 2016-03-27 DIAGNOSIS — C3412 Malignant neoplasm of upper lobe, left bronchus or lung: Secondary | ICD-10-CM | POA: Diagnosis not present

## 2016-03-28 DIAGNOSIS — I10 Essential (primary) hypertension: Secondary | ICD-10-CM | POA: Diagnosis not present

## 2016-03-28 DIAGNOSIS — R918 Other nonspecific abnormal finding of lung field: Secondary | ICD-10-CM | POA: Diagnosis not present

## 2016-03-28 DIAGNOSIS — Z419 Encounter for procedure for purposes other than remedying health state, unspecified: Secondary | ICD-10-CM | POA: Diagnosis not present

## 2016-03-28 DIAGNOSIS — F418 Other specified anxiety disorders: Secondary | ICD-10-CM | POA: Diagnosis not present

## 2016-03-28 DIAGNOSIS — R9431 Abnormal electrocardiogram [ECG] [EKG]: Secondary | ICD-10-CM | POA: Diagnosis not present

## 2016-03-28 DIAGNOSIS — S27321A Contusion of lung, unilateral, initial encounter: Secondary | ICD-10-CM | POA: Diagnosis not present

## 2016-03-28 DIAGNOSIS — E039 Hypothyroidism, unspecified: Secondary | ICD-10-CM | POA: Diagnosis not present

## 2016-03-28 DIAGNOSIS — Z01818 Encounter for other preprocedural examination: Secondary | ICD-10-CM | POA: Diagnosis not present

## 2016-03-29 NOTE — Telephone Encounter (Signed)
Medication was faxed [phone].

## 2016-03-31 ENCOUNTER — Encounter: Payer: Self-pay | Admitting: Internal Medicine

## 2016-04-04 DIAGNOSIS — E039 Hypothyroidism, unspecified: Secondary | ICD-10-CM | POA: Diagnosis present

## 2016-04-04 DIAGNOSIS — C3412 Malignant neoplasm of upper lobe, left bronchus or lung: Secondary | ICD-10-CM | POA: Diagnosis present

## 2016-04-04 DIAGNOSIS — J9383 Other pneumothorax: Secondary | ICD-10-CM | POA: Diagnosis not present

## 2016-04-04 DIAGNOSIS — G9781 Other intraoperative complications of nervous system: Secondary | ICD-10-CM | POA: Diagnosis not present

## 2016-04-04 DIAGNOSIS — Z87891 Personal history of nicotine dependence: Secondary | ICD-10-CM | POA: Diagnosis not present

## 2016-04-04 DIAGNOSIS — H409 Unspecified glaucoma: Secondary | ICD-10-CM | POA: Diagnosis present

## 2016-04-04 DIAGNOSIS — F329 Major depressive disorder, single episode, unspecified: Secondary | ICD-10-CM | POA: Diagnosis present

## 2016-04-04 DIAGNOSIS — R918 Other nonspecific abnormal finding of lung field: Secondary | ICD-10-CM | POA: Diagnosis not present

## 2016-04-04 DIAGNOSIS — C3490 Malignant neoplasm of unspecified part of unspecified bronchus or lung: Secondary | ICD-10-CM | POA: Diagnosis not present

## 2016-04-04 DIAGNOSIS — R1310 Dysphagia, unspecified: Secondary | ICD-10-CM | POA: Diagnosis not present

## 2016-04-04 DIAGNOSIS — I10 Essential (primary) hypertension: Secondary | ICD-10-CM | POA: Diagnosis present

## 2016-04-04 DIAGNOSIS — Z9889 Other specified postprocedural states: Secondary | ICD-10-CM | POA: Diagnosis not present

## 2016-04-04 DIAGNOSIS — Z4682 Encounter for fitting and adjustment of non-vascular catheter: Secondary | ICD-10-CM | POA: Diagnosis not present

## 2016-04-04 DIAGNOSIS — R633 Feeding difficulties: Secondary | ICD-10-CM | POA: Diagnosis not present

## 2016-04-11 ENCOUNTER — Telehealth: Payer: Self-pay

## 2016-04-11 ENCOUNTER — Encounter: Payer: Self-pay | Admitting: Internal Medicine

## 2016-04-11 ENCOUNTER — Ambulatory Visit (INDEPENDENT_AMBULATORY_CARE_PROVIDER_SITE_OTHER): Payer: Medicare Other | Admitting: Internal Medicine

## 2016-04-11 ENCOUNTER — Ambulatory Visit: Payer: PRIVATE HEALTH INSURANCE

## 2016-04-11 ENCOUNTER — Ambulatory Visit (INDEPENDENT_AMBULATORY_CARE_PROVIDER_SITE_OTHER): Payer: Medicare Other

## 2016-04-11 ENCOUNTER — Telehealth: Payer: Self-pay | Admitting: Internal Medicine

## 2016-04-11 VITALS — BP 118/68 | HR 95 | Ht 62.0 in | Wt 180.0 lb

## 2016-04-11 DIAGNOSIS — R0600 Dyspnea, unspecified: Secondary | ICD-10-CM

## 2016-04-11 DIAGNOSIS — C3490 Malignant neoplasm of unspecified part of unspecified bronchus or lung: Secondary | ICD-10-CM | POA: Diagnosis not present

## 2016-04-11 DIAGNOSIS — C3492 Malignant neoplasm of unspecified part of left bronchus or lung: Secondary | ICD-10-CM | POA: Diagnosis not present

## 2016-04-11 NOTE — Telephone Encounter (Signed)
Patient called to get Xray results, spoke with her and reviewed notes from provider.  thanks

## 2016-04-11 NOTE — Patient Instructions (Signed)
Chest xray today.  Follow up Friday

## 2016-04-11 NOTE — Progress Notes (Signed)
Pre visit review using our clinic review tool, if applicable. No additional management support is needed unless otherwise documented below in the visit note. 

## 2016-04-11 NOTE — Assessment & Plan Note (Signed)
Reviewed results from recent lung biopsy and lobectomy at [facility] with pt. Path showed mucinous adenocarcinoma. Follow up at [facility] is pending.

## 2016-04-11 NOTE — Progress Notes (Signed)
Subjective:    Patient ID: Stacy Santana, female    DOB: April 07, 1951, 65 y.o.   MRN: [id number]  HPI  65YO female presents for follow up.  Recently had lung biopsy at [facility] for persistent nodular area in left upper lung, and biopsy showed adenocarcinoma.  Noted to have left vocal cord paralysis after surgery. Seen by ENT inpatient and had swallow study. Discussing some options for care with ENT. Also has speech path follow up. Having some shortness of breath with talking which she attributes to this.  No fever chills. Pain well controlled with only rare use of Oxycodone or Ibuprofen. Occasional dry cough. Notes some increased anxiety and tearfulness given recent unexpected diagnosis of lung cancer. Has strong support from family. Husband driving her currently.   Wt Readings from Last 3 Encounters:  04/11/16 180 lb (81.647 kg)  06/21/15 180 lb (81.647 kg)  06/20/15 181 lb (82.101 kg)   BP Readings from Last 3 Encounters:  04/11/16 118/68  06/21/15 128/70  06/20/15 149/64    Past Medical History  Diagnosis Date  . Hypertension   . Glaucoma     left eye, Dr. Morrison Old in [place]  . Osteoporosis     osteopenia, Fosamax then Boniva 2009  . Vitamin D deficiency     on supplements  . Allergy     seasonal  . Endometriosis 1989    small amount  . HSV infection   . Carpal tunnel syndrome   . Degenerative joint disease   . Depression    Family History  Problem Relation Age of Onset  . Osteoporosis Mother     secondary to steroids  . Rheum arthritis Mother   . Hypertension Mother   . COPD Mother   . Arthritis Mother   . Stroke Mother   . Cancer Father     prostate  . Hypertrophic cardiomyopathy Sister   . Schizophrenia Paternal Aunt   . Heart disease Maternal Grandmother   . COPD Maternal Grandfather   . Depression Son   . Anxiety disorder Son   . ADD / ADHD Son   . Depression Maternal Uncle   . ADD / ADHD Son    Past Surgical History  Procedure Laterality Date  .  Nasal sinus surgery  2012    Dr. Pryor Ochoa  . Laparoscopy  1999  . Appendectomy  1964    open  . Tonsillectomy    . Tubal ligation  1992  . Carpal tunnel release Right 01/2014    Dr. Malvin Johns  . Foot neuroma surgery  May 2016    Left foot   Social History   Social History  . Marital Status: Married    Spouse Name: N/A  . Number of Children: N/A  . Years of Education: N/A   Social History Main Topics  . Smoking status: Former Smoker -- 1.00 packs/day for 16 years    Types: Cigarettes    Quit date: 10/02/1983  . Smokeless tobacco: Never Used  . Alcohol Use: Yes     Comment: occassionally  . Drug Use: No  . Sexual Activity: Yes   Other Topics Concern  . None   Social History Narrative    Review of Systems  Constitutional: Positive for fatigue. Negative for fever, chills and unexpected weight change.  HENT: Negative for congestion, ear discharge, ear pain, facial swelling, hearing loss, mouth sores, nosebleeds, postnasal drip, rhinorrhea, sinus pressure, sneezing, sore throat, tinnitus, trouble swallowing and voice change.   Eyes: Negative  for pain, discharge, redness and visual disturbance.  Respiratory: Positive for cough and shortness of breath. Negative for chest tightness, wheezing and stridor.   Cardiovascular: Positive for chest pain (chest wall pain). Negative for palpitations and leg swelling.  Musculoskeletal: Negative for myalgias, arthralgias, neck pain and neck stiffness.  Skin: Negative for color change and rash.  Neurological: Negative for dizziness, weakness, light-headedness and headaches.  Hematological: Negative for adenopathy.  Psychiatric/Behavioral: Positive for dysphoric mood. Negative for suicidal ideas and sleep disturbance. The patient is nervous/anxious.        Objective:    BP 118/68 mmHg  Pulse 95  Ht '5\' 2"'$  (1.575 m)  Wt 180 lb (81.647 kg)  BMI 32.91 kg/m2  SpO2 98% Physical Exam  Constitutional: She is oriented to person, place, and time.  She appears well-developed and well-nourished. No distress.  HENT:  Head: Normocephalic and atraumatic.  Right Ear: External ear normal.  Left Ear: External ear normal.  Nose: Nose normal.  Mouth/Throat: Oropharynx is clear and moist.  Eyes: Conjunctivae are normal. Pupils are equal, round, and reactive to light. Right eye exhibits no discharge. Left eye exhibits no discharge. No scleral icterus.  Neck: Normal range of motion. Neck supple. No tracheal deviation present. No thyromegaly present.  Cardiovascular: Normal rate, regular rhythm, normal heart sounds and intact distal pulses.  Exam reveals no gallop and no friction rub.   No murmur heard. Pulmonary/Chest: Breath sounds normal. Accessory muscle usage (with minimal exertion such as talking) present. Tachypnea noted. No respiratory distress. She has no decreased breath sounds. She has no wheezes. She has no rhonchi. She has no rales. She exhibits no tenderness.    Musculoskeletal: Normal range of motion. She exhibits no edema or tenderness.  Lymphadenopathy:    She has no cervical adenopathy.  Neurological: She is alert and oriented to person, place, and time. No cranial nerve deficit. She exhibits normal muscle tone. Coordination normal.  Skin: Skin is warm and dry. No rash noted. She is not diaphoretic. No erythema. No pallor.  Psychiatric: She has a normal mood and affect. Her behavior is normal. Judgment and thought content normal.          Assessment & Plan:   Problem List Items Addressed This Visit      Unprioritized   Adenocarcinoma, lung ([place]) - Primary (Chronic)    Reviewed results from recent lung biopsy and lobectomy at [facility] with pt. Path showed mucinous adenocarcinoma. Follow up at [facility] is pending.       Relevant Medications   Oxycodone HCl 10 MG TABS   gabapentin (NEURONTIN) 300 MG capsule   Dyspnea    Dyspnea noted with minimal exertion such as talking. Exam normal with good air movement. CXR shows no  pneumothorax. If symptoms persistent, discussed follow up with thoracic surgery at [facility] and possible repeat CT.      Relevant Orders   DG Chest 2 View (Completed)       Return in about 2 days (around 04/13/2016).  Ronette Deter, MD Internal Medicine [facility]

## 2016-04-11 NOTE — Telephone Encounter (Signed)
Tried mobile number, and was told this is no longer her number. Tried her home number and got error message. Would you mind trying to figure out what is her correct number? I am trying to check in with her because she was short of breath.

## 2016-04-11 NOTE — Assessment & Plan Note (Signed)
Dyspnea noted with minimal exertion such as talking. Exam normal with good air movement. CXR shows no pneumothorax. If symptoms persistent, discussed follow up with thoracic surgery at [facility] and possible repeat CT.

## 2016-04-13 ENCOUNTER — Ambulatory Visit (INDEPENDENT_AMBULATORY_CARE_PROVIDER_SITE_OTHER): Payer: Medicare Other | Admitting: Internal Medicine

## 2016-04-13 VITALS — BP 118/62 | HR 97

## 2016-04-13 DIAGNOSIS — I10 Essential (primary) hypertension: Secondary | ICD-10-CM | POA: Diagnosis not present

## 2016-04-13 DIAGNOSIS — C3492 Malignant neoplasm of unspecified part of left bronchus or lung: Secondary | ICD-10-CM | POA: Diagnosis not present

## 2016-04-13 DIAGNOSIS — R0600 Dyspnea, unspecified: Secondary | ICD-10-CM

## 2016-04-13 DIAGNOSIS — F419 Anxiety disorder, unspecified: Secondary | ICD-10-CM

## 2016-04-13 DIAGNOSIS — F418 Other specified anxiety disorders: Secondary | ICD-10-CM | POA: Diagnosis not present

## 2016-04-13 DIAGNOSIS — F329 Major depressive disorder, single episode, unspecified: Secondary | ICD-10-CM

## 2016-04-13 MED ORDER — BUPROPION HCL ER (XL) 300 MG PO TB24: ORAL_TABLET | ORAL | Status: DC

## 2016-04-13 MED ORDER — AMLODIPINE BESYLATE 5 MG PO TABS
5.0000 mg | ORAL_TABLET | Freq: Every day | ORAL | Status: DC
Start: 2016-04-13 — End: 2016-09-27

## 2016-04-13 NOTE — Telephone Encounter (Signed)
Patient has appoint today for follow up.

## 2016-04-13 NOTE — Assessment & Plan Note (Signed)
Encouraged her to consider counseling given tremendous stress of recent cancer diagnosis. She prefers to hold off for now.

## 2016-04-13 NOTE — Progress Notes (Signed)
Subjective:    Patient ID: Stacy Santana, female    DOB: 1951-07-31, 65 y.o.   MRN: [id number]  HPI  65YO female presents for follow up.  Recently seen for dyspnea. Symptoms are improving. Had more cough yesterday. Occasionally productive.  Follow up at [facility] moved up to next Thursday.   Wt Readings from Last 3 Encounters:  04/11/16 180 lb (81.647 kg)  06/21/15 180 lb (81.647 kg)  06/20/15 181 lb (82.101 kg)   BP Readings from Last 3 Encounters:  04/13/16 118/62  04/11/16 118/68  06/21/15 128/70    Past Medical History  Diagnosis Date  . Hypertension   . Glaucoma     left eye, Dr. Morrison Old in [place]  . Osteoporosis     osteopenia, Fosamax then Boniva 2009  . Vitamin D deficiency     on supplements  . Allergy     seasonal  . Endometriosis 1989    small amount  . HSV infection   . Carpal tunnel syndrome   . Degenerative joint disease   . Depression    Family History  Problem Relation Age of Onset  . Osteoporosis Mother     secondary to steroids  . Rheum arthritis Mother   . Hypertension Mother   . COPD Mother   . Arthritis Mother   . Stroke Mother   . Cancer Father     prostate  . Hypertrophic cardiomyopathy Sister   . Schizophrenia Paternal Aunt   . Heart disease Maternal Grandmother   . COPD Maternal Grandfather   . Depression Son   . Anxiety disorder Son   . ADD / ADHD Son   . Depression Maternal Uncle   . ADD / ADHD Son    Past Surgical History  Procedure Laterality Date  . Nasal sinus surgery  2012    Dr. Pryor Ochoa  . Laparoscopy  1999  . Appendectomy  1964    open  . Tonsillectomy    . Tubal ligation  1992  . Carpal tunnel release Right 01/2014    Dr. Malvin Johns  . Foot neuroma surgery  May 2016    Left foot   Social History   Social History  . Marital Status: Married    Spouse Name: N/A  . Number of Children: N/A  . Years of Education: N/A   Social History Main Topics  . Smoking status: Former Smoker -- 1.00 packs/day for 16 years   Types: Cigarettes    Quit date: 10/02/1983  . Smokeless tobacco: Never Used  . Alcohol Use: Yes     Comment: occassionally  . Drug Use: No  . Sexual Activity: Yes   Other Topics Concern  . Not on file   Social History Narrative    Review of Systems  Constitutional: Positive for fatigue. Negative for fever, chills and unexpected weight change.  HENT: Negative for congestion, ear discharge, ear pain, facial swelling, hearing loss, mouth sores, nosebleeds, postnasal drip, rhinorrhea, sinus pressure, sneezing, sore throat, tinnitus, trouble swallowing and voice change.   Eyes: Negative for pain, discharge, redness and visual disturbance.  Respiratory: Positive for cough and shortness of breath. Negative for chest tightness, wheezing and stridor.   Cardiovascular: Negative for chest pain, palpitations and leg swelling.  Musculoskeletal: Negative for myalgias, arthralgias, neck pain and neck stiffness.  Skin: Negative for color change and rash.  Neurological: Negative for dizziness, weakness, light-headedness and headaches.  Hematological: Negative for adenopathy.  Psychiatric/Behavioral: Positive for sleep disturbance and dysphoric mood. The patient  is nervous/anxious.        Objective:    BP 118/62 mmHg  Pulse 97  SpO2 99% Physical Exam  Constitutional: She is oriented to person, place, and time. She appears well-developed and well-nourished. No distress.  HENT:  Head: Normocephalic and atraumatic.  Right Ear: External ear normal.  Left Ear: External ear normal.  Nose: Nose normal.  Mouth/Throat: Oropharynx is clear and moist. No oropharyngeal exudate.  Eyes: Conjunctivae are normal. Pupils are equal, round, and reactive to light. Right eye exhibits no discharge. Left eye exhibits no discharge. No scleral icterus.  Neck: Normal range of motion. Neck supple. No tracheal deviation present. No thyromegaly present.  Cardiovascular: Normal rate, regular rhythm, normal heart sounds  and intact distal pulses.  Exam reveals no gallop and no friction rub.   No murmur heard. Pulmonary/Chest: Effort normal and breath sounds normal. No accessory muscle usage. No tachypnea. No respiratory distress. She has no decreased breath sounds. She has no wheezes. She has no rhonchi. She has no rales. She exhibits no tenderness.  Musculoskeletal: Normal range of motion. She exhibits no edema or tenderness.  Lymphadenopathy:    She has no cervical adenopathy.  Neurological: She is alert and oriented to person, place, and time. No cranial nerve deficit. She exhibits normal muscle tone. Coordination normal.  Skin: Skin is warm and dry. No rash noted. She is not diaphoretic. No erythema. No pallor.  Psychiatric: Her behavior is normal. Judgment and thought content normal. Her mood appears anxious. She exhibits a depressed mood.          Assessment & Plan:   Problem List Items Addressed This Visit      Unprioritized   Adenocarcinoma, lung ([place]) (Chronic)   Anxiety and depression    Encouraged her to consider counseling given tremendous stress of recent cancer diagnosis. She prefers to hold off for now.      Dyspnea - Primary    Symptoms have improved. Reviewed CXR. Will continue prn Albuterol. Follow up with CT surgery next week. Follow up here in 2 weeks and prn.      Hypertension (Chronic)   Relevant Medications   amLODipine (NORVASC) 5 MG tablet       Return in about 2 weeks (around 04/27/2016).  Ronette Deter, MD Internal Medicine [facility]

## 2016-04-13 NOTE — Assessment & Plan Note (Signed)
Symptoms have improved. Reviewed CXR. Will continue prn Albuterol. Follow up with CT surgery next week. Follow up here in 2 weeks and prn.

## 2016-04-19 DIAGNOSIS — R0602 Shortness of breath: Secondary | ICD-10-CM | POA: Diagnosis not present

## 2016-04-19 DIAGNOSIS — Z4802 Encounter for removal of sutures: Secondary | ICD-10-CM | POA: Diagnosis not present

## 2016-04-19 DIAGNOSIS — R918 Other nonspecific abnormal finding of lung field: Secondary | ICD-10-CM | POA: Diagnosis not present

## 2016-04-19 DIAGNOSIS — Z483 Aftercare following surgery for neoplasm: Secondary | ICD-10-CM | POA: Diagnosis not present

## 2016-04-19 DIAGNOSIS — C3412 Malignant neoplasm of upper lobe, left bronchus or lung: Secondary | ICD-10-CM | POA: Diagnosis not present

## 2016-04-19 DIAGNOSIS — Z902 Acquired absence of lung [part of]: Secondary | ICD-10-CM | POA: Diagnosis not present

## 2016-04-19 DIAGNOSIS — R0989 Other specified symptoms and signs involving the circulatory and respiratory systems: Secondary | ICD-10-CM | POA: Diagnosis not present

## 2016-04-20 ENCOUNTER — Encounter: Payer: Self-pay | Admitting: Internal Medicine

## 2016-04-30 ENCOUNTER — Ambulatory Visit: Payer: PRIVATE HEALTH INSURANCE | Admitting: Internal Medicine

## 2016-05-01 DIAGNOSIS — C3490 Malignant neoplasm of unspecified part of unspecified bronchus or lung: Secondary | ICD-10-CM | POA: Diagnosis not present

## 2016-05-01 DIAGNOSIS — R0602 Shortness of breath: Secondary | ICD-10-CM | POA: Diagnosis not present

## 2016-05-01 DIAGNOSIS — Z87891 Personal history of nicotine dependence: Secondary | ICD-10-CM | POA: Diagnosis not present

## 2016-05-01 DIAGNOSIS — Z885 Allergy status to narcotic agent status: Secondary | ICD-10-CM | POA: Diagnosis not present

## 2016-05-01 DIAGNOSIS — R490 Dysphonia: Secondary | ICD-10-CM | POA: Diagnosis not present

## 2016-05-01 DIAGNOSIS — Z8249 Family history of ischemic heart disease and other diseases of the circulatory system: Secondary | ICD-10-CM | POA: Diagnosis not present

## 2016-05-01 DIAGNOSIS — R0600 Dyspnea, unspecified: Secondary | ICD-10-CM | POA: Diagnosis not present

## 2016-05-01 DIAGNOSIS — Z9889 Other specified postprocedural states: Secondary | ICD-10-CM | POA: Diagnosis not present

## 2016-05-01 DIAGNOSIS — J3800 Paralysis of vocal cords and larynx, unspecified: Secondary | ICD-10-CM | POA: Diagnosis not present

## 2016-05-01 DIAGNOSIS — Z836 Family history of other diseases of the respiratory system: Secondary | ICD-10-CM | POA: Diagnosis not present

## 2016-05-01 DIAGNOSIS — R1310 Dysphagia, unspecified: Secondary | ICD-10-CM | POA: Diagnosis not present

## 2016-05-01 DIAGNOSIS — Z79899 Other long term (current) drug therapy: Secondary | ICD-10-CM | POA: Diagnosis not present

## 2016-05-01 DIAGNOSIS — I10 Essential (primary) hypertension: Secondary | ICD-10-CM | POA: Diagnosis not present

## 2016-05-02 ENCOUNTER — Encounter: Payer: Self-pay | Admitting: Internal Medicine

## 2016-05-02 ENCOUNTER — Ambulatory Visit (INDEPENDENT_AMBULATORY_CARE_PROVIDER_SITE_OTHER): Payer: Medicare Other | Admitting: Internal Medicine

## 2016-05-02 VITALS — BP 118/74 | HR 96 | Resp 26 | Wt 180.8 lb

## 2016-05-02 DIAGNOSIS — C3492 Malignant neoplasm of unspecified part of left bronchus or lung: Secondary | ICD-10-CM | POA: Diagnosis not present

## 2016-05-02 DIAGNOSIS — R490 Dysphonia: Secondary | ICD-10-CM

## 2016-05-02 NOTE — Progress Notes (Signed)
Pre visit review using our clinic review tool, if applicable. No additional management support is needed unless otherwise documented below in the visit note. 

## 2016-05-02 NOTE — Progress Notes (Signed)
Subjective:    Patient ID: Stacy Santana, female    DOB: Aug 22, 1951, 65 y.o.   MRN: [id number]  HPI  65YO female presents for follow up.  Currently being treated for Adenocarcinoma of left lung. Seen by ENT for dysphonia. Noted to have vocal cord dysfunction. Discussing options for intervention. Has follow up with radiation oncology and medical oncology.  Continues to have some anxiety about upcoming treatments. Taking occasional Clonazepam. Feels that symptoms are improving.  Wt Readings from Last 3 Encounters:  05/02/16 180 lb 12.8 oz (82 kg)  04/11/16 180 lb (81.6 kg)  06/21/15 180 lb (81.6 kg)   BP Readings from Last 3 Encounters:  05/02/16 118/74  04/13/16 118/62  04/11/16 118/68    Past Medical History:  Diagnosis Date  . Allergy    seasonal  . Carpal tunnel syndrome   . Degenerative joint disease   . Depression   . Endometriosis 1989   small amount  . Glaucoma    left eye, Dr. Morrison Old in [place]  . HSV infection   . Hypertension   . Osteoporosis    osteopenia, Fosamax then Boniva 2009  . Vitamin D deficiency    on supplements   Family History  Problem Relation Age of Onset  . Osteoporosis Mother     secondary to steroids  . Rheum arthritis Mother   . Hypertension Mother   . COPD Mother   . Arthritis Mother   . Stroke Mother   . Cancer Father     prostate  . Hypertrophic cardiomyopathy Sister   . Schizophrenia Paternal Aunt   . Heart disease Maternal Grandmother   . COPD Maternal Grandfather   . Depression Son   . Anxiety disorder Son   . ADD / ADHD Son   . Depression Maternal Uncle   . ADD / ADHD Son    Past Surgical History:  Procedure Laterality Date  . APPENDECTOMY  1964   open  . CARPAL TUNNEL RELEASE Right 01/2014   Dr. Malvin Johns  . FOOT NEUROMA SURGERY  May 2016   Left foot  . LAPAROSCOPY  1999  . NASAL SINUS SURGERY  2012   Dr. Pryor Ochoa  . TONSILLECTOMY    . TUBAL LIGATION  1992   Social History   Social History  . Marital status:  Married    Spouse name: N/A  . Number of children: N/A  . Years of education: N/A   Social History Main Topics  . Smoking status: Former Smoker    Packs/day: 1.00    Years: 16.00    Types: Cigarettes    Quit date: 10/02/1983  . Smokeless tobacco: Never Used  . Alcohol use Yes     Comment: occassionally  . Drug use: No  . Sexual activity: Yes   Other Topics Concern  . None   Social History Narrative  . None    Review of Systems  Constitutional: Negative for appetite change, chills, fatigue, fever and unexpected weight change.  HENT: Positive for trouble swallowing and voice change. Negative for sore throat.   Eyes: Negative for visual disturbance.  Respiratory: Positive for cough. Negative for chest tightness, shortness of breath and wheezing.   Cardiovascular: Negative for chest pain and leg swelling.  Gastrointestinal: Negative for abdominal pain, constipation, diarrhea, nausea and vomiting.  Musculoskeletal: Negative for arthralgias and myalgias.  Skin: Negative for color change and rash.  Hematological: Negative for adenopathy. Does not bruise/bleed easily.  Psychiatric/Behavioral: Positive for dysphoric mood. Negative for  sleep disturbance and suicidal ideas. The patient is nervous/anxious.        Objective:    BP 118/74 (BP Location: Left Arm, Patient Position: Sitting, Cuff Size: Large)   Pulse 96   Resp (!) 26   Wt 180 lb 12.8 oz (82 kg)   SpO2 98%   BMI 33.07 kg/m  Physical Exam  Constitutional: She is oriented to person, place, and time. She appears well-developed and well-nourished. No distress.  HENT:  Head: Normocephalic and atraumatic.  Right Ear: External ear normal.  Left Ear: External ear normal.  Nose: Nose normal.  Mouth/Throat: Oropharynx is clear and moist. No oropharyngeal exudate.  Eyes: Conjunctivae are normal. Pupils are equal, round, and reactive to light. Right eye exhibits no discharge. Left eye exhibits no discharge. No scleral icterus.   Neck: Normal range of motion. Neck supple. No tracheal deviation present. No thyromegaly present.  Cardiovascular: Normal rate, regular rhythm, normal heart sounds and intact distal pulses.  Exam reveals no gallop and no friction rub.   No murmur heard. Pulmonary/Chest: Effort normal and breath sounds normal. No accessory muscle usage. No tachypnea. No respiratory distress. She has no decreased breath sounds. She has no wheezes. She has no rhonchi. She has no rales. She exhibits no tenderness.  Musculoskeletal: Normal range of motion. She exhibits no edema or tenderness.  Lymphadenopathy:    She has no cervical adenopathy.  Neurological: She is alert and oriented to person, place, and time. No cranial nerve deficit. She exhibits normal muscle tone. Coordination normal.  Skin: Skin is warm and dry. No rash noted. She is not diaphoretic. No erythema. No pallor.  Psychiatric: She has a normal mood and affect. Her behavior is normal. Judgment and thought content normal.          Assessment & Plan:   Problem List Items Addressed This Visit      High   Adenocarcinoma, lung ([place]) - Primary (Chronic)    Reviewed notes from [facility]. Scheduled to meet with radiation oncology and medical oncology after tumor markers back to discuss treatment options. Will continue to follow.        Unprioritized   Dysphonia    Reviewed notes from ENT. Planning for possible filler injection to help with vocal cord dysfunction in 8 weeks. Will continue to monitor.       Other Visit Diagnoses   None.      Return in about 4 weeks (around 05/30/2016) for New Patient.  Ronette Deter, MD Internal Medicine [facility]

## 2016-05-02 NOTE — Assessment & Plan Note (Signed)
Reviewed notes from ENT. Planning for possible filler injection to help with vocal cord dysfunction in 8 weeks. Will continue to monitor.

## 2016-05-02 NOTE — Assessment & Plan Note (Signed)
Reviewed notes from [facility]. Scheduled to meet with radiation oncology and medical oncology after tumor markers back to discuss treatment options. Will continue to follow.

## 2016-05-03 NOTE — Progress Notes (Signed)
Stacy Santana,  I will accept Stacy Santana as a new patient from Dr Gilford Rile

## 2016-05-08 DIAGNOSIS — Z8249 Family history of ischemic heart disease and other diseases of the circulatory system: Secondary | ICD-10-CM | POA: Diagnosis not present

## 2016-05-08 DIAGNOSIS — I10 Essential (primary) hypertension: Secondary | ICD-10-CM | POA: Diagnosis not present

## 2016-05-08 DIAGNOSIS — H409 Unspecified glaucoma: Secondary | ICD-10-CM | POA: Diagnosis not present

## 2016-05-08 DIAGNOSIS — Z902 Acquired absence of lung [part of]: Secondary | ICD-10-CM | POA: Diagnosis not present

## 2016-05-08 DIAGNOSIS — Z87891 Personal history of nicotine dependence: Secondary | ICD-10-CM | POA: Diagnosis not present

## 2016-05-08 DIAGNOSIS — Z8042 Family history of malignant neoplasm of prostate: Secondary | ICD-10-CM | POA: Diagnosis not present

## 2016-05-08 DIAGNOSIS — E039 Hypothyroidism, unspecified: Secondary | ICD-10-CM | POA: Diagnosis not present

## 2016-05-08 DIAGNOSIS — C3412 Malignant neoplasm of upper lobe, left bronchus or lung: Secondary | ICD-10-CM | POA: Diagnosis not present

## 2016-05-08 DIAGNOSIS — M1990 Unspecified osteoarthritis, unspecified site: Secondary | ICD-10-CM | POA: Diagnosis not present

## 2016-05-08 DIAGNOSIS — Z3202 Encounter for pregnancy test, result negative: Secondary | ICD-10-CM | POA: Diagnosis not present

## 2016-05-15 ENCOUNTER — Encounter: Payer: Self-pay | Admitting: Internal Medicine

## 2016-05-15 DIAGNOSIS — C3490 Malignant neoplasm of unspecified part of unspecified bronchus or lung: Secondary | ICD-10-CM | POA: Diagnosis not present

## 2016-05-15 DIAGNOSIS — C3400 Malignant neoplasm of unspecified main bronchus: Secondary | ICD-10-CM | POA: Diagnosis not present

## 2016-05-16 DIAGNOSIS — Z23 Encounter for immunization: Secondary | ICD-10-CM | POA: Diagnosis not present

## 2016-05-28 ENCOUNTER — Encounter: Payer: Self-pay | Admitting: Internal Medicine

## 2016-05-29 DIAGNOSIS — R0600 Dyspnea, unspecified: Secondary | ICD-10-CM | POA: Diagnosis not present

## 2016-05-29 DIAGNOSIS — E039 Hypothyroidism, unspecified: Secondary | ICD-10-CM | POA: Diagnosis not present

## 2016-05-29 DIAGNOSIS — R05 Cough: Secondary | ICD-10-CM | POA: Diagnosis not present

## 2016-05-29 DIAGNOSIS — I10 Essential (primary) hypertension: Secondary | ICD-10-CM | POA: Diagnosis not present

## 2016-05-29 DIAGNOSIS — Z79899 Other long term (current) drug therapy: Secondary | ICD-10-CM | POA: Diagnosis not present

## 2016-05-29 DIAGNOSIS — C3490 Malignant neoplasm of unspecified part of unspecified bronchus or lung: Secondary | ICD-10-CM | POA: Diagnosis not present

## 2016-05-29 DIAGNOSIS — Z5111 Encounter for antineoplastic chemotherapy: Secondary | ICD-10-CM | POA: Diagnosis not present

## 2016-05-29 DIAGNOSIS — M1990 Unspecified osteoarthritis, unspecified site: Secondary | ICD-10-CM | POA: Diagnosis not present

## 2016-05-29 DIAGNOSIS — Z902 Acquired absence of lung [part of]: Secondary | ICD-10-CM | POA: Diagnosis not present

## 2016-05-29 DIAGNOSIS — F329 Major depressive disorder, single episode, unspecified: Secondary | ICD-10-CM | POA: Diagnosis not present

## 2016-05-29 DIAGNOSIS — G8918 Other acute postprocedural pain: Secondary | ICD-10-CM | POA: Diagnosis not present

## 2016-05-29 DIAGNOSIS — Z87891 Personal history of nicotine dependence: Secondary | ICD-10-CM | POA: Diagnosis not present

## 2016-05-29 DIAGNOSIS — H409 Unspecified glaucoma: Secondary | ICD-10-CM | POA: Diagnosis not present

## 2016-05-29 DIAGNOSIS — R200 Anesthesia of skin: Secondary | ICD-10-CM | POA: Diagnosis not present

## 2016-06-13 ENCOUNTER — Other Ambulatory Visit: Payer: PRIVATE HEALTH INSURANCE

## 2016-06-19 DIAGNOSIS — Z79899 Other long term (current) drug therapy: Secondary | ICD-10-CM | POA: Diagnosis not present

## 2016-06-19 DIAGNOSIS — R112 Nausea with vomiting, unspecified: Secondary | ICD-10-CM | POA: Diagnosis not present

## 2016-06-19 DIAGNOSIS — M1990 Unspecified osteoarthritis, unspecified site: Secondary | ICD-10-CM | POA: Diagnosis not present

## 2016-06-19 DIAGNOSIS — Z902 Acquired absence of lung [part of]: Secondary | ICD-10-CM | POA: Diagnosis not present

## 2016-06-19 DIAGNOSIS — Z885 Allergy status to narcotic agent status: Secondary | ICD-10-CM | POA: Diagnosis not present

## 2016-06-19 DIAGNOSIS — C3490 Malignant neoplasm of unspecified part of unspecified bronchus or lung: Secondary | ICD-10-CM | POA: Diagnosis not present

## 2016-06-19 DIAGNOSIS — H409 Unspecified glaucoma: Secondary | ICD-10-CM | POA: Diagnosis not present

## 2016-06-19 DIAGNOSIS — E039 Hypothyroidism, unspecified: Secondary | ICD-10-CM | POA: Diagnosis not present

## 2016-06-19 DIAGNOSIS — C3492 Malignant neoplasm of unspecified part of left bronchus or lung: Secondary | ICD-10-CM | POA: Diagnosis not present

## 2016-06-19 DIAGNOSIS — I10 Essential (primary) hypertension: Secondary | ICD-10-CM | POA: Diagnosis not present

## 2016-06-19 DIAGNOSIS — Z5111 Encounter for antineoplastic chemotherapy: Secondary | ICD-10-CM | POA: Diagnosis not present

## 2016-06-19 DIAGNOSIS — R200 Anesthesia of skin: Secondary | ICD-10-CM | POA: Diagnosis not present

## 2016-06-19 DIAGNOSIS — F329 Major depressive disorder, single episode, unspecified: Secondary | ICD-10-CM | POA: Diagnosis not present

## 2016-06-19 DIAGNOSIS — Z87891 Personal history of nicotine dependence: Secondary | ICD-10-CM | POA: Diagnosis not present

## 2016-06-19 DIAGNOSIS — R42 Dizziness and giddiness: Secondary | ICD-10-CM | POA: Diagnosis not present

## 2016-06-19 DIAGNOSIS — G8918 Other acute postprocedural pain: Secondary | ICD-10-CM | POA: Diagnosis not present

## 2016-06-20 ENCOUNTER — Encounter: Payer: PRIVATE HEALTH INSURANCE | Admitting: Internal Medicine

## 2016-06-20 ENCOUNTER — Ambulatory Visit: Payer: PRIVATE HEALTH INSURANCE

## 2016-06-20 ENCOUNTER — Other Ambulatory Visit: Payer: Self-pay

## 2016-06-20 MED ORDER — TRIAMTERENE-HCTZ 37.5-25 MG PO TABS: 1.0000 | ORAL_TABLET | Freq: Every day | ORAL | 2 refills | Status: DC

## 2016-06-22 DIAGNOSIS — C3490 Malignant neoplasm of unspecified part of unspecified bronchus or lung: Secondary | ICD-10-CM | POA: Diagnosis not present

## 2016-06-22 DIAGNOSIS — R112 Nausea with vomiting, unspecified: Secondary | ICD-10-CM | POA: Diagnosis not present

## 2016-06-28 DIAGNOSIS — F419 Anxiety disorder, unspecified: Secondary | ICD-10-CM | POA: Diagnosis not present

## 2016-06-28 DIAGNOSIS — Z79899 Other long term (current) drug therapy: Secondary | ICD-10-CM | POA: Diagnosis not present

## 2016-06-28 DIAGNOSIS — C3490 Malignant neoplasm of unspecified part of unspecified bronchus or lung: Secondary | ICD-10-CM | POA: Diagnosis not present

## 2016-06-28 DIAGNOSIS — R0600 Dyspnea, unspecified: Secondary | ICD-10-CM | POA: Diagnosis not present

## 2016-06-28 DIAGNOSIS — F329 Major depressive disorder, single episode, unspecified: Secondary | ICD-10-CM | POA: Diagnosis not present

## 2016-06-28 DIAGNOSIS — E039 Hypothyroidism, unspecified: Secondary | ICD-10-CM | POA: Diagnosis not present

## 2016-07-04 DIAGNOSIS — R490 Dysphonia: Secondary | ICD-10-CM | POA: Diagnosis not present

## 2016-07-04 DIAGNOSIS — J384 Edema of larynx: Secondary | ICD-10-CM | POA: Diagnosis not present

## 2016-07-04 DIAGNOSIS — I10 Essential (primary) hypertension: Secondary | ICD-10-CM | POA: Diagnosis not present

## 2016-07-04 DIAGNOSIS — J3801 Paralysis of vocal cords and larynx, unilateral: Secondary | ICD-10-CM | POA: Diagnosis not present

## 2016-07-04 DIAGNOSIS — J387 Other diseases of larynx: Secondary | ICD-10-CM | POA: Diagnosis not present

## 2016-07-04 DIAGNOSIS — R1310 Dysphagia, unspecified: Secondary | ICD-10-CM | POA: Diagnosis not present

## 2016-07-10 DIAGNOSIS — F329 Major depressive disorder, single episode, unspecified: Secondary | ICD-10-CM | POA: Diagnosis not present

## 2016-07-10 DIAGNOSIS — Z87891 Personal history of nicotine dependence: Secondary | ICD-10-CM | POA: Diagnosis not present

## 2016-07-10 DIAGNOSIS — E039 Hypothyroidism, unspecified: Secondary | ICD-10-CM | POA: Diagnosis not present

## 2016-07-10 DIAGNOSIS — Z5111 Encounter for antineoplastic chemotherapy: Secondary | ICD-10-CM | POA: Diagnosis not present

## 2016-07-10 DIAGNOSIS — C3490 Malignant neoplasm of unspecified part of unspecified bronchus or lung: Secondary | ICD-10-CM | POA: Diagnosis not present

## 2016-07-10 DIAGNOSIS — Z885 Allergy status to narcotic agent status: Secondary | ICD-10-CM | POA: Diagnosis not present

## 2016-07-10 DIAGNOSIS — Z902 Acquired absence of lung [part of]: Secondary | ICD-10-CM | POA: Diagnosis not present

## 2016-07-10 DIAGNOSIS — C3412 Malignant neoplasm of upper lobe, left bronchus or lung: Secondary | ICD-10-CM | POA: Diagnosis not present

## 2016-07-10 DIAGNOSIS — Z8042 Family history of malignant neoplasm of prostate: Secondary | ICD-10-CM | POA: Diagnosis not present

## 2016-07-10 DIAGNOSIS — I10 Essential (primary) hypertension: Secondary | ICD-10-CM | POA: Diagnosis not present

## 2016-07-10 DIAGNOSIS — R110 Nausea: Secondary | ICD-10-CM | POA: Diagnosis not present

## 2016-07-10 DIAGNOSIS — H409 Unspecified glaucoma: Secondary | ICD-10-CM | POA: Diagnosis not present

## 2016-07-10 DIAGNOSIS — Z79899 Other long term (current) drug therapy: Secondary | ICD-10-CM | POA: Diagnosis not present

## 2016-07-10 DIAGNOSIS — M1990 Unspecified osteoarthritis, unspecified site: Secondary | ICD-10-CM | POA: Diagnosis not present

## 2016-07-13 DIAGNOSIS — C3490 Malignant neoplasm of unspecified part of unspecified bronchus or lung: Secondary | ICD-10-CM | POA: Diagnosis not present

## 2016-07-16 DIAGNOSIS — C3490 Malignant neoplasm of unspecified part of unspecified bronchus or lung: Secondary | ICD-10-CM | POA: Diagnosis not present

## 2016-07-16 DIAGNOSIS — R110 Nausea: Secondary | ICD-10-CM | POA: Diagnosis not present

## 2016-07-24 DIAGNOSIS — R918 Other nonspecific abnormal finding of lung field: Secondary | ICD-10-CM | POA: Diagnosis not present

## 2016-07-24 DIAGNOSIS — R5383 Other fatigue: Secondary | ICD-10-CM | POA: Diagnosis not present

## 2016-07-24 DIAGNOSIS — R490 Dysphonia: Secondary | ICD-10-CM | POA: Diagnosis not present

## 2016-07-24 DIAGNOSIS — C3490 Malignant neoplasm of unspecified part of unspecified bronchus or lung: Secondary | ICD-10-CM | POA: Diagnosis not present

## 2016-07-24 DIAGNOSIS — C3412 Malignant neoplasm of upper lobe, left bronchus or lung: Secondary | ICD-10-CM | POA: Diagnosis not present

## 2016-07-24 DIAGNOSIS — Z902 Acquired absence of lung [part of]: Secondary | ICD-10-CM | POA: Diagnosis not present

## 2016-07-24 DIAGNOSIS — R110 Nausea: Secondary | ICD-10-CM | POA: Diagnosis not present

## 2016-07-31 DIAGNOSIS — Z5111 Encounter for antineoplastic chemotherapy: Secondary | ICD-10-CM | POA: Diagnosis not present

## 2016-07-31 DIAGNOSIS — F329 Major depressive disorder, single episode, unspecified: Secondary | ICD-10-CM | POA: Diagnosis not present

## 2016-07-31 DIAGNOSIS — M1990 Unspecified osteoarthritis, unspecified site: Secondary | ICD-10-CM | POA: Diagnosis not present

## 2016-07-31 DIAGNOSIS — C3492 Malignant neoplasm of unspecified part of left bronchus or lung: Secondary | ICD-10-CM | POA: Diagnosis not present

## 2016-07-31 DIAGNOSIS — Z7952 Long term (current) use of systemic steroids: Secondary | ICD-10-CM | POA: Diagnosis not present

## 2016-07-31 DIAGNOSIS — Z885 Allergy status to narcotic agent status: Secondary | ICD-10-CM | POA: Diagnosis not present

## 2016-07-31 DIAGNOSIS — R112 Nausea with vomiting, unspecified: Secondary | ICD-10-CM | POA: Diagnosis not present

## 2016-07-31 DIAGNOSIS — Z87891 Personal history of nicotine dependence: Secondary | ICD-10-CM | POA: Diagnosis not present

## 2016-07-31 DIAGNOSIS — Z902 Acquired absence of lung [part of]: Secondary | ICD-10-CM | POA: Diagnosis not present

## 2016-07-31 DIAGNOSIS — Z791 Long term (current) use of non-steroidal anti-inflammatories (NSAID): Secondary | ICD-10-CM | POA: Diagnosis not present

## 2016-07-31 DIAGNOSIS — Z79899 Other long term (current) drug therapy: Secondary | ICD-10-CM | POA: Diagnosis not present

## 2016-07-31 DIAGNOSIS — H409 Unspecified glaucoma: Secondary | ICD-10-CM | POA: Diagnosis not present

## 2016-07-31 DIAGNOSIS — E039 Hypothyroidism, unspecified: Secondary | ICD-10-CM | POA: Diagnosis not present

## 2016-07-31 DIAGNOSIS — G8918 Other acute postprocedural pain: Secondary | ICD-10-CM | POA: Diagnosis not present

## 2016-07-31 DIAGNOSIS — H9311 Tinnitus, right ear: Secondary | ICD-10-CM | POA: Diagnosis not present

## 2016-07-31 DIAGNOSIS — C3490 Malignant neoplasm of unspecified part of unspecified bronchus or lung: Secondary | ICD-10-CM | POA: Diagnosis not present

## 2016-07-31 DIAGNOSIS — R200 Anesthesia of skin: Secondary | ICD-10-CM | POA: Diagnosis not present

## 2016-07-31 DIAGNOSIS — I10 Essential (primary) hypertension: Secondary | ICD-10-CM | POA: Diagnosis not present

## 2016-08-03 DIAGNOSIS — C3490 Malignant neoplasm of unspecified part of unspecified bronchus or lung: Secondary | ICD-10-CM | POA: Diagnosis not present

## 2016-08-03 DIAGNOSIS — Z452 Encounter for adjustment and management of vascular access device: Secondary | ICD-10-CM | POA: Diagnosis not present

## 2016-08-06 DIAGNOSIS — Z79899 Other long term (current) drug therapy: Secondary | ICD-10-CM | POA: Diagnosis not present

## 2016-08-06 DIAGNOSIS — C3490 Malignant neoplasm of unspecified part of unspecified bronchus or lung: Secondary | ICD-10-CM | POA: Diagnosis not present

## 2016-08-07 DIAGNOSIS — Z8042 Family history of malignant neoplasm of prostate: Secondary | ICD-10-CM | POA: Diagnosis not present

## 2016-08-07 DIAGNOSIS — Z87891 Personal history of nicotine dependence: Secondary | ICD-10-CM | POA: Diagnosis not present

## 2016-08-07 DIAGNOSIS — H409 Unspecified glaucoma: Secondary | ICD-10-CM | POA: Diagnosis not present

## 2016-08-07 DIAGNOSIS — M1990 Unspecified osteoarthritis, unspecified site: Secondary | ICD-10-CM | POA: Diagnosis not present

## 2016-08-07 DIAGNOSIS — C3412 Malignant neoplasm of upper lobe, left bronchus or lung: Secondary | ICD-10-CM | POA: Diagnosis not present

## 2016-08-07 DIAGNOSIS — R0600 Dyspnea, unspecified: Secondary | ICD-10-CM | POA: Diagnosis not present

## 2016-08-07 DIAGNOSIS — Z902 Acquired absence of lung [part of]: Secondary | ICD-10-CM | POA: Diagnosis not present

## 2016-08-07 DIAGNOSIS — Z8249 Family history of ischemic heart disease and other diseases of the circulatory system: Secondary | ICD-10-CM | POA: Diagnosis not present

## 2016-08-07 DIAGNOSIS — Z3202 Encounter for pregnancy test, result negative: Secondary | ICD-10-CM | POA: Diagnosis not present

## 2016-08-07 DIAGNOSIS — C3490 Malignant neoplasm of unspecified part of unspecified bronchus or lung: Secondary | ICD-10-CM | POA: Diagnosis not present

## 2016-08-07 DIAGNOSIS — I10 Essential (primary) hypertension: Secondary | ICD-10-CM | POA: Diagnosis not present

## 2016-08-07 DIAGNOSIS — E039 Hypothyroidism, unspecified: Secondary | ICD-10-CM | POA: Diagnosis not present

## 2016-08-15 DIAGNOSIS — Z3202 Encounter for pregnancy test, result negative: Secondary | ICD-10-CM | POA: Diagnosis not present

## 2016-08-15 DIAGNOSIS — I10 Essential (primary) hypertension: Secondary | ICD-10-CM | POA: Diagnosis not present

## 2016-08-15 DIAGNOSIS — Z902 Acquired absence of lung [part of]: Secondary | ICD-10-CM | POA: Diagnosis not present

## 2016-08-15 DIAGNOSIS — E039 Hypothyroidism, unspecified: Secondary | ICD-10-CM | POA: Diagnosis not present

## 2016-08-15 DIAGNOSIS — C3412 Malignant neoplasm of upper lobe, left bronchus or lung: Secondary | ICD-10-CM | POA: Diagnosis not present

## 2016-08-15 DIAGNOSIS — Z87891 Personal history of nicotine dependence: Secondary | ICD-10-CM | POA: Diagnosis not present

## 2016-08-20 DIAGNOSIS — E039 Hypothyroidism, unspecified: Secondary | ICD-10-CM | POA: Diagnosis not present

## 2016-08-20 DIAGNOSIS — I10 Essential (primary) hypertension: Secondary | ICD-10-CM | POA: Diagnosis not present

## 2016-08-20 DIAGNOSIS — Z87891 Personal history of nicotine dependence: Secondary | ICD-10-CM | POA: Diagnosis not present

## 2016-08-20 DIAGNOSIS — Z3202 Encounter for pregnancy test, result negative: Secondary | ICD-10-CM | POA: Diagnosis not present

## 2016-08-20 DIAGNOSIS — Z902 Acquired absence of lung [part of]: Secondary | ICD-10-CM | POA: Diagnosis not present

## 2016-08-20 DIAGNOSIS — C3412 Malignant neoplasm of upper lobe, left bronchus or lung: Secondary | ICD-10-CM | POA: Diagnosis not present

## 2016-08-21 DIAGNOSIS — Z3202 Encounter for pregnancy test, result negative: Secondary | ICD-10-CM | POA: Diagnosis not present

## 2016-08-21 DIAGNOSIS — E039 Hypothyroidism, unspecified: Secondary | ICD-10-CM | POA: Diagnosis not present

## 2016-08-21 DIAGNOSIS — Z902 Acquired absence of lung [part of]: Secondary | ICD-10-CM | POA: Diagnosis not present

## 2016-08-21 DIAGNOSIS — C3412 Malignant neoplasm of upper lobe, left bronchus or lung: Secondary | ICD-10-CM | POA: Diagnosis not present

## 2016-08-21 DIAGNOSIS — I10 Essential (primary) hypertension: Secondary | ICD-10-CM | POA: Diagnosis not present

## 2016-08-21 DIAGNOSIS — Z87891 Personal history of nicotine dependence: Secondary | ICD-10-CM | POA: Diagnosis not present

## 2016-08-27 ENCOUNTER — Encounter: Payer: Self-pay | Admitting: Family

## 2016-08-27 ENCOUNTER — Ambulatory Visit (INDEPENDENT_AMBULATORY_CARE_PROVIDER_SITE_OTHER): Payer: Medicare Other

## 2016-08-27 ENCOUNTER — Ambulatory Visit (INDEPENDENT_AMBULATORY_CARE_PROVIDER_SITE_OTHER): Payer: Medicare Other | Admitting: Family

## 2016-08-27 VITALS — BP 145/85 | HR 106 | Temp 98.6°F | Ht 62.0 in | Wt 174.0 lb

## 2016-08-27 DIAGNOSIS — J209 Acute bronchitis, unspecified: Secondary | ICD-10-CM

## 2016-08-27 DIAGNOSIS — R000 Tachycardia, unspecified: Secondary | ICD-10-CM

## 2016-08-27 DIAGNOSIS — Z3202 Encounter for pregnancy test, result negative: Secondary | ICD-10-CM | POA: Diagnosis not present

## 2016-08-27 DIAGNOSIS — C3412 Malignant neoplasm of upper lobe, left bronchus or lung: Secondary | ICD-10-CM | POA: Diagnosis not present

## 2016-08-27 DIAGNOSIS — I10 Essential (primary) hypertension: Secondary | ICD-10-CM

## 2016-08-27 DIAGNOSIS — E039 Hypothyroidism, unspecified: Secondary | ICD-10-CM | POA: Diagnosis not present

## 2016-08-27 DIAGNOSIS — Z902 Acquired absence of lung [part of]: Secondary | ICD-10-CM | POA: Diagnosis not present

## 2016-08-27 DIAGNOSIS — R05 Cough: Secondary | ICD-10-CM | POA: Diagnosis not present

## 2016-08-27 DIAGNOSIS — Z87891 Personal history of nicotine dependence: Secondary | ICD-10-CM | POA: Diagnosis not present

## 2016-08-27 MED ORDER — AZITHROMYCIN 250 MG PO TABS: ORAL_TABLET | ORAL | 0 refills | Status: DC

## 2016-08-27 NOTE — Assessment & Plan Note (Signed)
Slightly elevated. Advised to call our office with HR and BP when she is home. Suspect anxiety contributing

## 2016-08-27 NOTE — Progress Notes (Signed)
Pre visit review using our clinic review tool, if applicable. No additional management support is needed unless otherwise documented below in the visit note. 

## 2016-08-27 NOTE — Assessment & Plan Note (Signed)
No acute cardiac symptoms today. Rhythm regular. Patient has radiation today and suspect anxiety is contributing. Pending chest x-ray to evaluate for bacterial etiology. Discussed with patient if heart rate remains persistently elevated today, we will start low-dose metoprolol.She will call us with HR when she gets home.

## 2016-08-27 NOTE — Assessment & Plan Note (Addendum)
SaO2 98. Bronchitis and suspect concurrent sinusitis. Pending CXR. Due to duration of symptoms and starting radiation today, patient and I jointly agreed to start antibiotic.

## 2016-08-27 NOTE — Progress Notes (Signed)
Subjective:    Patient ID: Stacy Santana, female    DOB: 01-07-1951, 65 y.o.   MRN: [id number]  CC: Stacy Santana is a 65 y.o. female who presents today for an acute visit.    HPI: Here for acute visit chief complaint sinus congestion, right ear pain for past 2 weeks. Unchanged. No ear pain right now. Endorse productive cough. Endorses SOB. Noticed swelling 'lymph node' over right neck yesterday, has resolved now. No wheezing.  History of lung cancer, LUL lobectomy and chemo. Starts radiation today and very anxious today.    HTN- Has taken medications less than one hour ago. She doesn't feel any palpitations. Notes that BP has 'been higher than usual' since treatment. Has been SOB for past few months after first cycle. Denies exertional chest pain or pressure, numbness or tingling radiating to left arm or jaw, palpitations, dizziness, frequent headaches, changes in vision.   Echo 2012 normal.     HISTORY:  Past Medical History:  Diagnosis Date  . Allergy    seasonal  . Carpal tunnel syndrome   . Degenerative joint disease   . Depression   . Endometriosis 1989   small amount  . Glaucoma    left eye, Dr. Morrison Old in [place]  . HSV infection   . Hypertension   . Osteoporosis    osteopenia, Fosamax then Boniva 2009  . Vitamin D deficiency    on supplements   Past Surgical History:  Procedure Laterality Date  . APPENDECTOMY  1964   open  . CARPAL TUNNEL RELEASE Right 01/2014   Dr. Malvin Johns  . FOOT NEUROMA SURGERY  May 2016   Left foot  . LAPAROSCOPY  1999  . NASAL SINUS SURGERY  2012   Dr. Pryor Ochoa  . TONSILLECTOMY    . TUBAL LIGATION  1992   Family History  Problem Relation Age of Onset  . Osteoporosis Mother     secondary to steroids  . Rheum arthritis Mother   . Hypertension Mother   . COPD Mother   . Arthritis Mother   . Stroke Mother   . Cancer Father     prostate  . Hypertrophic cardiomyopathy Sister   . Schizophrenia Paternal Aunt   . Heart disease Maternal  Grandmother   . COPD Maternal Grandfather   . Depression Son   . Anxiety disorder Son   . ADD / ADHD Son   . Depression Maternal Uncle   . ADD / ADHD Son     Allergies: Codeine Current Outpatient Prescriptions on File Prior to Visit  Medication Sig Dispense Refill  . albuterol (PROVENTIL HFA;VENTOLIN HFA) 108 (90 Base) MCG/ACT inhaler Inhale into the lungs.    [name] amLODipine (NORVASC) 5 MG tablet Take 1 tablet (5 mg total) by mouth daily. 90 tablet 3  . azelastine (ASTELIN) 0.1 % nasal spray PLACE 1 SPRAY INTO BOTH NOSTRILS AT BEDTIME AS NEEDED 30 mL 6  . buPROPion (WELLBUTRIN XL) 300 MG 24 hr tablet TAKE 1 TABLET(300 MG) BY MOUTH DAILY 90 tablet 1  . Cholecalciferol (VITAMIN D PO) Take 1 tablet by mouth daily.      . clonazePAM (KLONOPIN) 0.5 MG tablet Take 1 tablet (0.5 mg total) by mouth 2 (two) times daily as needed for anxiety. 60 tablet 1  . gabapentin (NEURONTIN) 300 MG capsule Take 300 mg by mouth.    . levothyroxine (SYNTHROID, LEVOTHROID) 50 MCG tablet TAKE 1 TABLET BY MOUTH DAILY 90 tablet 0  . loratadine (CLARITIN) 10  MG tablet Take 10 mg by mouth daily.      . Multiple Vitamin (MULTIVITAMIN) tablet Take 1 tablet by mouth daily.    . Naproxen Sodium 220 MG CAPS Take 2 capsules by mouth daily.      . Omega-3 Fatty Acids (FISH OIL PO) Take 1 capsule by mouth daily.      . Probiotic Product (PROBIOTIC DAILY PO) Take by mouth.    . sodium chloride 0.9 % nebulizer solution     . travoprost, benzalkonium, (TRAVATAN) 0.004 % ophthalmic solution Place 2 drops into both eyes at bedtime.    . triamcinolone (NASACORT) 55 MCG/ACT AERO nasal inhaler Place into the nose.    . triamterene-hydrochlorothiazide (MAXZIDE-25) 37.5-25 MG tablet Take 1 tablet by mouth daily. 90 tablet 2   No current facility-administered medications on file prior to visit.     Social History  Substance Use Topics  . Smoking status: Former Smoker    Packs/day: 1.00    Years: 16.00    Types: Cigarettes     Quit date: 10/02/1983  . Smokeless tobacco: Never Used  . Alcohol use Yes     Comment: occassionally    Review of Systems  Constitutional: Negative for chills and fever.  HENT: Positive for congestion and ear pain (right). Negative for ear discharge, sinus pain, sinus pressure and sore throat.   Respiratory: Positive for cough and shortness of breath. Negative for wheezing.   Cardiovascular: Negative for chest pain and palpitations.  Gastrointestinal: Negative for nausea and vomiting.  Neurological: Negative for headaches.      Objective:    BP (!) 145/85   Pulse (!) 106   Temp 98.6 F (37 C) (Oral)   Ht '5\' 2"'$  (1.575 m)   Wt 174 lb (78.9 kg)   SpO2 98%   BMI 31.83 kg/m    Physical Exam  Constitutional: She appears well-developed and well-nourished.  HENT:  Head: Normocephalic and atraumatic.  Right Ear: Hearing, external ear and ear canal normal. No drainage, swelling or tenderness. No foreign bodies. Tympanic membrane is erythematous and bulging. No middle ear effusion. No decreased hearing is noted.  Left Ear: Hearing, external ear and ear canal normal. No drainage, swelling or tenderness. No foreign bodies. Tympanic membrane is erythematous and bulging.  No middle ear effusion. No decreased hearing is noted.  Nose: Nose normal. No rhinorrhea. Right sinus exhibits no maxillary sinus tenderness and no frontal sinus tenderness. Left sinus exhibits no maxillary sinus tenderness and no frontal sinus tenderness.  Mouth/Throat: Uvula is midline, oropharynx is clear and moist and mucous membranes are normal. No oropharyngeal exudate, posterior oropharyngeal edema, posterior oropharyngeal erythema or tonsillar abscesses.  Eyes: Conjunctivae are normal.  Cardiovascular: Regular rhythm, normal heart sounds and normal pulses.   Pulmonary/Chest: Effort normal and breath sounds normal. She has no wheezes. She has no rhonchi. She has no rales.  Lymphadenopathy:       Head (right side): No  submental, no submandibular, no tonsillar, no preauricular, no posterior auricular and no occipital adenopathy present.       Head (left side): No submental, no submandibular, no tonsillar, no preauricular, no posterior auricular and no occipital adenopathy present.    She has no cervical adenopathy.  Neurological: She is alert.  Skin: Skin is warm and dry.  Psychiatric: She has a normal mood and affect. Her speech is normal and behavior is normal. Thought content normal.  Vitals reviewed.      Assessment & Plan:  Problem List Items Addressed This Visit      Cardiovascular and Mediastinum   Hypertension (Chronic)    Slightly elevated. Advised to call our office with HR and BP when she is home. Suspect anxiety contributing        Respiratory   Acute bronchitis - Primary    SaO2 98. Bronchitis and suspect concurrent sinusitis. Pending CXR. Due to duration of symptoms and starting radiation today, patient and I jointly agreed to start antibiotic.      Relevant Medications   azithromycin (ZITHROMAX) 250 MG tablet   Other Relevant Orders   DG Chest 2 View     Other   Tachycardia    No acute cardiac symptoms today. Rhythm regular. Patient has radiation today and suspect anxiety is contributing. Pending chest x-ray to evaluate for bacterial etiology. Discussed with patient if heart rate remains persistently elevated today, we will start low-dose metoprolol.She will call us with HR when she gets home.            I am having Ms. Gahan start on azithromycin. I am also having her maintain her loratadine, Naproxen Sodium, Cholecalciferol (VITAMIN D PO), Omega-3 Fatty Acids (FISH OIL PO), travoprost (benzalkonium), multivitamin, Probiotic Product (PROBIOTIC DAILY PO), levothyroxine, azelastine, clonazePAM, albuterol, gabapentin, sodium chloride, triamcinolone, buPROPion, amLODipine, and triamterene-hydrochlorothiazide.   Meds ordered this encounter  Medications  . azithromycin  (ZITHROMAX) 250 MG tablet    Sig: Tale 500 mg PO on day 1, then 250 mg PO q24h x 4 days.    Dispense:  6 tablet    Refill:  0    Order Specific Question:   Supervising Provider    Answer:   Crecencio Mc [[id number]]    Return precautions given.   Risks, benefits, and alternatives of the medications and treatment plan prescribed today were discussed, and patient expressed understanding.   Education regarding symptom management and diagnosis given to patient on AVS.  Continue to follow with TULLO, Aris Everts, MD for routine health maintenance.   [name] and I agreed with plan.   Mable Paris, FNP

## 2016-08-27 NOTE — Patient Instructions (Addendum)
Will need to monitor HR. If continues to stay above 100, please call our office today to let us know TODAY.  Suspect respiratory infeciton is causing elevation.   If any chest pain, increase in SOB, please go to ED.   Increase intake of clear fluids. Congestion is best treated by hydration, when mucus is wetter, it is thinner, less sticky, and easier to expel from the body, either through coughing up drainage, or by blowing your nose.   Get plenty of rest.   Use saline nasal drops and blow your nose frequently. Run a humidifier at night and elevate the head of the bed. Vicks Vapor rub will help with congestion and cough. Steam showers and sinus massage for congestion.   Use Acetaminophen or Ibuprofen as needed for fever or pain. Avoid second hand smoke. Even the smallest exposure will worsen symptoms.   Over the counter medications you can try include Delsym for cough, a decongestant for congestion, and Mucinex or Robitussin as an expectorant. Be sure to just get the plain Mucinex or Robitussin that just has one medication (Guaifenesen). We don't recommend the combination products. Note, be sure to drink two glasses of water with each dose of Mucinex as the medication will not work well without adequate hydration.   You can also try a teaspoon of honey to see if this will help reduce cough. Throat lozenges can sometimes be beneficial as well.    This illness will typically last 7 - 10 days.   Please follow up with our clinic if you develop a fever greater than 101 F, symptoms worsen, or do not resolve in the next week.

## 2016-08-28 DIAGNOSIS — Z87891 Personal history of nicotine dependence: Secondary | ICD-10-CM | POA: Diagnosis not present

## 2016-08-28 DIAGNOSIS — Z902 Acquired absence of lung [part of]: Secondary | ICD-10-CM | POA: Diagnosis not present

## 2016-08-28 DIAGNOSIS — I10 Essential (primary) hypertension: Secondary | ICD-10-CM | POA: Diagnosis not present

## 2016-08-28 DIAGNOSIS — Z3202 Encounter for pregnancy test, result negative: Secondary | ICD-10-CM | POA: Diagnosis not present

## 2016-08-28 DIAGNOSIS — C3412 Malignant neoplasm of upper lobe, left bronchus or lung: Secondary | ICD-10-CM | POA: Diagnosis not present

## 2016-08-28 DIAGNOSIS — E039 Hypothyroidism, unspecified: Secondary | ICD-10-CM | POA: Diagnosis not present

## 2016-08-29 DIAGNOSIS — Z902 Acquired absence of lung [part of]: Secondary | ICD-10-CM | POA: Diagnosis not present

## 2016-08-29 DIAGNOSIS — I10 Essential (primary) hypertension: Secondary | ICD-10-CM | POA: Diagnosis not present

## 2016-08-29 DIAGNOSIS — Z3202 Encounter for pregnancy test, result negative: Secondary | ICD-10-CM | POA: Diagnosis not present

## 2016-08-29 DIAGNOSIS — C3412 Malignant neoplasm of upper lobe, left bronchus or lung: Secondary | ICD-10-CM | POA: Diagnosis not present

## 2016-08-29 DIAGNOSIS — E039 Hypothyroidism, unspecified: Secondary | ICD-10-CM | POA: Diagnosis not present

## 2016-08-29 DIAGNOSIS — Z87891 Personal history of nicotine dependence: Secondary | ICD-10-CM | POA: Diagnosis not present

## 2016-08-30 ENCOUNTER — Encounter: Payer: Self-pay | Admitting: Internal Medicine

## 2016-08-30 ENCOUNTER — Other Ambulatory Visit: Payer: Self-pay | Admitting: Internal Medicine

## 2016-08-30 DIAGNOSIS — Z902 Acquired absence of lung [part of]: Secondary | ICD-10-CM | POA: Diagnosis not present

## 2016-08-30 DIAGNOSIS — R000 Tachycardia, unspecified: Secondary | ICD-10-CM

## 2016-08-30 DIAGNOSIS — Z87891 Personal history of nicotine dependence: Secondary | ICD-10-CM | POA: Diagnosis not present

## 2016-08-30 DIAGNOSIS — I10 Essential (primary) hypertension: Secondary | ICD-10-CM | POA: Diagnosis not present

## 2016-08-30 DIAGNOSIS — C3412 Malignant neoplasm of upper lobe, left bronchus or lung: Secondary | ICD-10-CM | POA: Diagnosis not present

## 2016-08-30 DIAGNOSIS — E559 Vitamin D deficiency, unspecified: Secondary | ICD-10-CM

## 2016-08-30 DIAGNOSIS — E039 Hypothyroidism, unspecified: Secondary | ICD-10-CM | POA: Diagnosis not present

## 2016-08-30 DIAGNOSIS — Z3202 Encounter for pregnancy test, result negative: Secondary | ICD-10-CM | POA: Diagnosis not present

## 2016-08-31 ENCOUNTER — Telehealth: Payer: Self-pay | Admitting: Internal Medicine

## 2016-08-31 ENCOUNTER — Other Ambulatory Visit: Payer: Self-pay | Admitting: Internal Medicine

## 2016-08-31 ENCOUNTER — Ambulatory Visit (INDEPENDENT_AMBULATORY_CARE_PROVIDER_SITE_OTHER): Payer: Medicare Other

## 2016-08-31 ENCOUNTER — Other Ambulatory Visit (INDEPENDENT_AMBULATORY_CARE_PROVIDER_SITE_OTHER): Payer: Medicare Other

## 2016-08-31 DIAGNOSIS — I10 Essential (primary) hypertension: Secondary | ICD-10-CM | POA: Diagnosis not present

## 2016-08-31 DIAGNOSIS — C3490 Malignant neoplasm of unspecified part of unspecified bronchus or lung: Secondary | ICD-10-CM | POA: Diagnosis not present

## 2016-08-31 DIAGNOSIS — Z8042 Family history of malignant neoplasm of prostate: Secondary | ICD-10-CM | POA: Diagnosis not present

## 2016-08-31 DIAGNOSIS — Z87891 Personal history of nicotine dependence: Secondary | ICD-10-CM | POA: Diagnosis not present

## 2016-08-31 DIAGNOSIS — E559 Vitamin D deficiency, unspecified: Secondary | ICD-10-CM | POA: Diagnosis not present

## 2016-08-31 DIAGNOSIS — E039 Hypothyroidism, unspecified: Secondary | ICD-10-CM | POA: Diagnosis not present

## 2016-08-31 DIAGNOSIS — Z8249 Family history of ischemic heart disease and other diseases of the circulatory system: Secondary | ICD-10-CM | POA: Diagnosis not present

## 2016-08-31 DIAGNOSIS — H409 Unspecified glaucoma: Secondary | ICD-10-CM | POA: Diagnosis not present

## 2016-08-31 DIAGNOSIS — C3412 Malignant neoplasm of upper lobe, left bronchus or lung: Secondary | ICD-10-CM | POA: Diagnosis not present

## 2016-08-31 DIAGNOSIS — Z3202 Encounter for pregnancy test, result negative: Secondary | ICD-10-CM | POA: Diagnosis not present

## 2016-08-31 DIAGNOSIS — M1990 Unspecified osteoarthritis, unspecified site: Secondary | ICD-10-CM | POA: Diagnosis not present

## 2016-08-31 DIAGNOSIS — Z902 Acquired absence of lung [part of]: Secondary | ICD-10-CM | POA: Diagnosis not present

## 2016-08-31 DIAGNOSIS — R000 Tachycardia, unspecified: Secondary | ICD-10-CM

## 2016-08-31 DIAGNOSIS — R002 Palpitations: Secondary | ICD-10-CM | POA: Diagnosis not present

## 2016-08-31 LAB — CBC WITH DIFFERENTIAL/PLATELET
BASOS PCT: 1 %
Basophils Absolute: 64 cells/uL (ref 0–200)
EOS PCT: 1 %
Eosinophils Absolute: 64 cells/uL (ref 15–500)
HCT: 32.8 % — ABNORMAL LOW (ref 35.0–45.0)
Hemoglobin: 11.3 g/dL — ABNORMAL LOW (ref 11.7–15.5)
LYMPHS PCT: 21 %
Lymphs Abs: 1344 cells/uL (ref 850–3900)
MCH: 32 pg (ref 27.0–33.0)
MCHC: 34.5 g/dL (ref 32.0–36.0)
MCV: 92.9 fL (ref 80.0–100.0)
MONOS PCT: 12 %
MPV: 9 fL (ref 7.5–12.5)
Monocytes Absolute: 768 cells/uL (ref 200–950)
NEUTROS ABS: 4160 {cells}/uL (ref 1500–7800)
Neutrophils Relative %: 65 %
PLATELETS: 284 10*3/uL (ref 140–400)
RBC: 3.53 MIL/uL — AB (ref 3.80–5.10)
RDW: 17.5 % — AB (ref 11.0–15.0)
WBC: 6.4 10*3/uL (ref 3.8–10.8)

## 2016-08-31 MED ORDER — METOPROLOL SUCCINATE ER 25 MG PO TB24: 25.0000 mg | ORAL_TABLET | Freq: Every day | ORAL | 3 refills | Status: DC

## 2016-08-31 NOTE — Telephone Encounter (Signed)
Pt scheduled for EKG and notified to check in for both appts.

## 2016-08-31 NOTE — Progress Notes (Unsigned)
mtop

## 2016-08-31 NOTE — Telephone Encounter (Signed)
Pt called and scheduled an appt for lab work this afternoon as per Dr. Derrel Nip. Dr. Derrel Nip also asked that an EKG book for this pt as well. Can you please help assist with this? Thank you!  Call [phone] (before 12)  [phone]

## 2016-08-31 NOTE — Progress Notes (Signed)
Patient reports that heart rate is resting is 107 and pulse with activity of 120.   Please advise.

## 2016-08-31 NOTE — Telephone Encounter (Signed)
She will need a nurse visit about the same time as the lab appointment.

## 2016-09-01 ENCOUNTER — Encounter: Payer: Self-pay | Admitting: Internal Medicine

## 2016-09-01 LAB — COMPREHENSIVE METABOLIC PANEL
ALBUMIN: 4.4 g/dL (ref 3.6–5.1)
ALK PHOS: 66 U/L (ref 33–130)
ALT: 26 U/L (ref 6–29)
AST: 24 U/L (ref 10–35)
BILIRUBIN TOTAL: 0.3 mg/dL (ref 0.2–1.2)
BUN: 24 mg/dL (ref 7–25)
CALCIUM: 9.7 mg/dL (ref 8.6–10.4)
CO2: 22 mmol/L (ref 20–31)
Chloride: 100 mmol/L (ref 98–110)
Creat: 1.42 mg/dL — ABNORMAL HIGH (ref 0.50–0.99)
Glucose, Bld: 131 mg/dL — ABNORMAL HIGH (ref 65–99)
POTASSIUM: 3.6 mmol/L (ref 3.5–5.3)
Sodium: 137 mmol/L (ref 135–146)
Total Protein: 6.5 g/dL (ref 6.1–8.1)

## 2016-09-01 LAB — VITAMIN D 25 HYDROXY (VIT D DEFICIENCY, FRACTURES): VIT D 25 HYDROXY: 32 ng/mL (ref 30–100)

## 2016-09-01 LAB — MAGNESIUM: MAGNESIUM: 1.8 mg/dL (ref 1.5–2.5)

## 2016-09-03 DIAGNOSIS — Z902 Acquired absence of lung [part of]: Secondary | ICD-10-CM | POA: Diagnosis not present

## 2016-09-03 DIAGNOSIS — Z3202 Encounter for pregnancy test, result negative: Secondary | ICD-10-CM | POA: Diagnosis not present

## 2016-09-03 DIAGNOSIS — C3412 Malignant neoplasm of upper lobe, left bronchus or lung: Secondary | ICD-10-CM | POA: Diagnosis not present

## 2016-09-03 DIAGNOSIS — Z87891 Personal history of nicotine dependence: Secondary | ICD-10-CM | POA: Diagnosis not present

## 2016-09-03 DIAGNOSIS — E039 Hypothyroidism, unspecified: Secondary | ICD-10-CM | POA: Diagnosis not present

## 2016-09-03 DIAGNOSIS — I10 Essential (primary) hypertension: Secondary | ICD-10-CM | POA: Diagnosis not present

## 2016-09-04 DIAGNOSIS — Z3202 Encounter for pregnancy test, result negative: Secondary | ICD-10-CM | POA: Diagnosis not present

## 2016-09-04 DIAGNOSIS — E039 Hypothyroidism, unspecified: Secondary | ICD-10-CM | POA: Diagnosis not present

## 2016-09-04 DIAGNOSIS — I10 Essential (primary) hypertension: Secondary | ICD-10-CM | POA: Diagnosis not present

## 2016-09-04 DIAGNOSIS — C3412 Malignant neoplasm of upper lobe, left bronchus or lung: Secondary | ICD-10-CM | POA: Diagnosis not present

## 2016-09-04 DIAGNOSIS — Z902 Acquired absence of lung [part of]: Secondary | ICD-10-CM | POA: Diagnosis not present

## 2016-09-04 DIAGNOSIS — Z87891 Personal history of nicotine dependence: Secondary | ICD-10-CM | POA: Diagnosis not present

## 2016-09-04 NOTE — Progress Notes (Signed)
Patient was assessed by Dr. Derrel Nip with ekg and labs.  No vital signs were taken so patient she was not charged for the visit .  Her tachycardia appears to be due to baseline deconditioning and superimposed dehydration.  She is asymptomatic

## 2016-09-05 DIAGNOSIS — C3412 Malignant neoplasm of upper lobe, left bronchus or lung: Secondary | ICD-10-CM | POA: Diagnosis not present

## 2016-09-05 DIAGNOSIS — E039 Hypothyroidism, unspecified: Secondary | ICD-10-CM | POA: Diagnosis not present

## 2016-09-05 DIAGNOSIS — Z87891 Personal history of nicotine dependence: Secondary | ICD-10-CM | POA: Diagnosis not present

## 2016-09-05 DIAGNOSIS — Z902 Acquired absence of lung [part of]: Secondary | ICD-10-CM | POA: Diagnosis not present

## 2016-09-05 DIAGNOSIS — Z3202 Encounter for pregnancy test, result negative: Secondary | ICD-10-CM | POA: Diagnosis not present

## 2016-09-05 DIAGNOSIS — I10 Essential (primary) hypertension: Secondary | ICD-10-CM | POA: Diagnosis not present

## 2016-09-06 DIAGNOSIS — Z87891 Personal history of nicotine dependence: Secondary | ICD-10-CM | POA: Diagnosis not present

## 2016-09-06 DIAGNOSIS — I10 Essential (primary) hypertension: Secondary | ICD-10-CM | POA: Diagnosis not present

## 2016-09-06 DIAGNOSIS — Z902 Acquired absence of lung [part of]: Secondary | ICD-10-CM | POA: Diagnosis not present

## 2016-09-06 DIAGNOSIS — Z3202 Encounter for pregnancy test, result negative: Secondary | ICD-10-CM | POA: Diagnosis not present

## 2016-09-06 DIAGNOSIS — C3412 Malignant neoplasm of upper lobe, left bronchus or lung: Secondary | ICD-10-CM | POA: Diagnosis not present

## 2016-09-06 DIAGNOSIS — E039 Hypothyroidism, unspecified: Secondary | ICD-10-CM | POA: Diagnosis not present

## 2016-09-07 ENCOUNTER — Other Ambulatory Visit (INDEPENDENT_AMBULATORY_CARE_PROVIDER_SITE_OTHER): Payer: Medicare Other

## 2016-09-07 ENCOUNTER — Encounter: Payer: Self-pay | Admitting: Internal Medicine

## 2016-09-07 ENCOUNTER — Other Ambulatory Visit: Payer: PRIVATE HEALTH INSURANCE

## 2016-09-07 DIAGNOSIS — E039 Hypothyroidism, unspecified: Secondary | ICD-10-CM | POA: Diagnosis not present

## 2016-09-07 DIAGNOSIS — Z87891 Personal history of nicotine dependence: Secondary | ICD-10-CM | POA: Diagnosis not present

## 2016-09-07 DIAGNOSIS — R7989 Other specified abnormal findings of blood chemistry: Secondary | ICD-10-CM

## 2016-09-07 DIAGNOSIS — Z902 Acquired absence of lung [part of]: Secondary | ICD-10-CM | POA: Diagnosis not present

## 2016-09-07 DIAGNOSIS — I10 Essential (primary) hypertension: Secondary | ICD-10-CM | POA: Diagnosis not present

## 2016-09-07 DIAGNOSIS — Z3202 Encounter for pregnancy test, result negative: Secondary | ICD-10-CM | POA: Diagnosis not present

## 2016-09-07 DIAGNOSIS — C3412 Malignant neoplasm of upper lobe, left bronchus or lung: Secondary | ICD-10-CM | POA: Diagnosis not present

## 2016-09-07 LAB — BASIC METABOLIC PANEL
BUN: 24 mg/dL — AB (ref 6–23)
CHLORIDE: 100 meq/L (ref 96–112)
CO2: 32 mEq/L (ref 19–32)
CREATININE: 1.32 mg/dL — AB (ref 0.40–1.20)
Calcium: 10.4 mg/dL (ref 8.4–10.5)
GFR: 42.58 mL/min — ABNORMAL LOW (ref >60.00)
Glucose, Bld: 108 mg/dL — ABNORMAL HIGH (ref 70–99)
Potassium: 3.9 mEq/L (ref 3.5–5.1)
Sodium: 139 mEq/L (ref 135–145)

## 2016-09-08 ENCOUNTER — Encounter: Payer: Self-pay | Admitting: Internal Medicine

## 2016-09-08 ENCOUNTER — Other Ambulatory Visit: Payer: Self-pay | Admitting: Internal Medicine

## 2016-09-08 DIAGNOSIS — N179 Acute kidney failure, unspecified: Secondary | ICD-10-CM

## 2016-09-10 ENCOUNTER — Encounter: Payer: Self-pay | Admitting: Internal Medicine

## 2016-09-10 DIAGNOSIS — I10 Essential (primary) hypertension: Secondary | ICD-10-CM | POA: Diagnosis not present

## 2016-09-10 DIAGNOSIS — Z87891 Personal history of nicotine dependence: Secondary | ICD-10-CM | POA: Diagnosis not present

## 2016-09-10 DIAGNOSIS — E039 Hypothyroidism, unspecified: Secondary | ICD-10-CM | POA: Diagnosis not present

## 2016-09-10 DIAGNOSIS — Z902 Acquired absence of lung [part of]: Secondary | ICD-10-CM | POA: Diagnosis not present

## 2016-09-10 DIAGNOSIS — C3412 Malignant neoplasm of upper lobe, left bronchus or lung: Secondary | ICD-10-CM | POA: Diagnosis not present

## 2016-09-10 DIAGNOSIS — Z3202 Encounter for pregnancy test, result negative: Secondary | ICD-10-CM | POA: Diagnosis not present

## 2016-09-11 DIAGNOSIS — C3412 Malignant neoplasm of upper lobe, left bronchus or lung: Secondary | ICD-10-CM | POA: Diagnosis not present

## 2016-09-11 DIAGNOSIS — Z902 Acquired absence of lung [part of]: Secondary | ICD-10-CM | POA: Diagnosis not present

## 2016-09-11 DIAGNOSIS — Z3202 Encounter for pregnancy test, result negative: Secondary | ICD-10-CM | POA: Diagnosis not present

## 2016-09-11 DIAGNOSIS — Z87891 Personal history of nicotine dependence: Secondary | ICD-10-CM | POA: Diagnosis not present

## 2016-09-11 DIAGNOSIS — E039 Hypothyroidism, unspecified: Secondary | ICD-10-CM | POA: Diagnosis not present

## 2016-09-11 DIAGNOSIS — I10 Essential (primary) hypertension: Secondary | ICD-10-CM | POA: Diagnosis not present

## 2016-09-12 DIAGNOSIS — Z3202 Encounter for pregnancy test, result negative: Secondary | ICD-10-CM | POA: Diagnosis not present

## 2016-09-12 DIAGNOSIS — E039 Hypothyroidism, unspecified: Secondary | ICD-10-CM | POA: Diagnosis not present

## 2016-09-12 DIAGNOSIS — Z902 Acquired absence of lung [part of]: Secondary | ICD-10-CM | POA: Diagnosis not present

## 2016-09-12 DIAGNOSIS — C3412 Malignant neoplasm of upper lobe, left bronchus or lung: Secondary | ICD-10-CM | POA: Diagnosis not present

## 2016-09-12 DIAGNOSIS — Z87891 Personal history of nicotine dependence: Secondary | ICD-10-CM | POA: Diagnosis not present

## 2016-09-12 DIAGNOSIS — I10 Essential (primary) hypertension: Secondary | ICD-10-CM | POA: Diagnosis not present

## 2016-09-13 DIAGNOSIS — Z87891 Personal history of nicotine dependence: Secondary | ICD-10-CM | POA: Diagnosis not present

## 2016-09-13 DIAGNOSIS — Z902 Acquired absence of lung [part of]: Secondary | ICD-10-CM | POA: Diagnosis not present

## 2016-09-13 DIAGNOSIS — C3412 Malignant neoplasm of upper lobe, left bronchus or lung: Secondary | ICD-10-CM | POA: Diagnosis not present

## 2016-09-13 DIAGNOSIS — E039 Hypothyroidism, unspecified: Secondary | ICD-10-CM | POA: Diagnosis not present

## 2016-09-13 DIAGNOSIS — I10 Essential (primary) hypertension: Secondary | ICD-10-CM | POA: Diagnosis not present

## 2016-09-13 DIAGNOSIS — Z3202 Encounter for pregnancy test, result negative: Secondary | ICD-10-CM | POA: Diagnosis not present

## 2016-09-14 DIAGNOSIS — C3412 Malignant neoplasm of upper lobe, left bronchus or lung: Secondary | ICD-10-CM | POA: Diagnosis not present

## 2016-09-14 DIAGNOSIS — Z87891 Personal history of nicotine dependence: Secondary | ICD-10-CM | POA: Diagnosis not present

## 2016-09-14 DIAGNOSIS — Z902 Acquired absence of lung [part of]: Secondary | ICD-10-CM | POA: Diagnosis not present

## 2016-09-14 DIAGNOSIS — I10 Essential (primary) hypertension: Secondary | ICD-10-CM | POA: Diagnosis not present

## 2016-09-14 DIAGNOSIS — E039 Hypothyroidism, unspecified: Secondary | ICD-10-CM | POA: Diagnosis not present

## 2016-09-14 DIAGNOSIS — Z3202 Encounter for pregnancy test, result negative: Secondary | ICD-10-CM | POA: Diagnosis not present

## 2016-09-17 DIAGNOSIS — Z87891 Personal history of nicotine dependence: Secondary | ICD-10-CM | POA: Diagnosis not present

## 2016-09-17 DIAGNOSIS — I10 Essential (primary) hypertension: Secondary | ICD-10-CM | POA: Diagnosis not present

## 2016-09-17 DIAGNOSIS — Z3202 Encounter for pregnancy test, result negative: Secondary | ICD-10-CM | POA: Diagnosis not present

## 2016-09-17 DIAGNOSIS — Z902 Acquired absence of lung [part of]: Secondary | ICD-10-CM | POA: Diagnosis not present

## 2016-09-17 DIAGNOSIS — E039 Hypothyroidism, unspecified: Secondary | ICD-10-CM | POA: Diagnosis not present

## 2016-09-17 DIAGNOSIS — C3412 Malignant neoplasm of upper lobe, left bronchus or lung: Secondary | ICD-10-CM | POA: Diagnosis not present

## 2016-09-19 DIAGNOSIS — I10 Essential (primary) hypertension: Secondary | ICD-10-CM | POA: Diagnosis not present

## 2016-09-19 DIAGNOSIS — E039 Hypothyroidism, unspecified: Secondary | ICD-10-CM | POA: Diagnosis not present

## 2016-09-19 DIAGNOSIS — C3412 Malignant neoplasm of upper lobe, left bronchus or lung: Secondary | ICD-10-CM | POA: Diagnosis not present

## 2016-09-19 DIAGNOSIS — Z902 Acquired absence of lung [part of]: Secondary | ICD-10-CM | POA: Diagnosis not present

## 2016-09-19 DIAGNOSIS — Z3202 Encounter for pregnancy test, result negative: Secondary | ICD-10-CM | POA: Diagnosis not present

## 2016-09-19 DIAGNOSIS — Z87891 Personal history of nicotine dependence: Secondary | ICD-10-CM | POA: Diagnosis not present

## 2016-09-20 DIAGNOSIS — Z87891 Personal history of nicotine dependence: Secondary | ICD-10-CM | POA: Diagnosis not present

## 2016-09-20 DIAGNOSIS — Z3202 Encounter for pregnancy test, result negative: Secondary | ICD-10-CM | POA: Diagnosis not present

## 2016-09-20 DIAGNOSIS — E039 Hypothyroidism, unspecified: Secondary | ICD-10-CM | POA: Diagnosis not present

## 2016-09-20 DIAGNOSIS — C3412 Malignant neoplasm of upper lobe, left bronchus or lung: Secondary | ICD-10-CM | POA: Diagnosis not present

## 2016-09-20 DIAGNOSIS — I10 Essential (primary) hypertension: Secondary | ICD-10-CM | POA: Diagnosis not present

## 2016-09-20 DIAGNOSIS — Z902 Acquired absence of lung [part of]: Secondary | ICD-10-CM | POA: Diagnosis not present

## 2016-09-21 DIAGNOSIS — Z3202 Encounter for pregnancy test, result negative: Secondary | ICD-10-CM | POA: Diagnosis not present

## 2016-09-21 DIAGNOSIS — C3412 Malignant neoplasm of upper lobe, left bronchus or lung: Secondary | ICD-10-CM | POA: Diagnosis not present

## 2016-09-21 DIAGNOSIS — E039 Hypothyroidism, unspecified: Secondary | ICD-10-CM | POA: Diagnosis not present

## 2016-09-21 DIAGNOSIS — Z902 Acquired absence of lung [part of]: Secondary | ICD-10-CM | POA: Diagnosis not present

## 2016-09-21 DIAGNOSIS — Z87891 Personal history of nicotine dependence: Secondary | ICD-10-CM | POA: Diagnosis not present

## 2016-09-21 DIAGNOSIS — I10 Essential (primary) hypertension: Secondary | ICD-10-CM | POA: Diagnosis not present

## 2016-09-25 DIAGNOSIS — C3412 Malignant neoplasm of upper lobe, left bronchus or lung: Secondary | ICD-10-CM | POA: Diagnosis not present

## 2016-09-25 DIAGNOSIS — E039 Hypothyroidism, unspecified: Secondary | ICD-10-CM | POA: Diagnosis not present

## 2016-09-25 DIAGNOSIS — Z3202 Encounter for pregnancy test, result negative: Secondary | ICD-10-CM | POA: Diagnosis not present

## 2016-09-25 DIAGNOSIS — Z902 Acquired absence of lung [part of]: Secondary | ICD-10-CM | POA: Diagnosis not present

## 2016-09-25 DIAGNOSIS — Z87891 Personal history of nicotine dependence: Secondary | ICD-10-CM | POA: Diagnosis not present

## 2016-09-25 DIAGNOSIS — I10 Essential (primary) hypertension: Secondary | ICD-10-CM | POA: Diagnosis not present

## 2016-09-26 ENCOUNTER — Encounter: Payer: Self-pay | Admitting: Internal Medicine

## 2016-09-26 ENCOUNTER — Other Ambulatory Visit (INDEPENDENT_AMBULATORY_CARE_PROVIDER_SITE_OTHER): Payer: Medicare Other

## 2016-09-26 DIAGNOSIS — C3412 Malignant neoplasm of upper lobe, left bronchus or lung: Secondary | ICD-10-CM | POA: Diagnosis not present

## 2016-09-26 DIAGNOSIS — I10 Essential (primary) hypertension: Secondary | ICD-10-CM | POA: Diagnosis not present

## 2016-09-26 DIAGNOSIS — N179 Acute kidney failure, unspecified: Secondary | ICD-10-CM

## 2016-09-26 DIAGNOSIS — E039 Hypothyroidism, unspecified: Secondary | ICD-10-CM | POA: Diagnosis not present

## 2016-09-26 DIAGNOSIS — Z902 Acquired absence of lung [part of]: Secondary | ICD-10-CM | POA: Diagnosis not present

## 2016-09-26 DIAGNOSIS — Z87891 Personal history of nicotine dependence: Secondary | ICD-10-CM | POA: Diagnosis not present

## 2016-09-26 DIAGNOSIS — Z3202 Encounter for pregnancy test, result negative: Secondary | ICD-10-CM | POA: Diagnosis not present

## 2016-09-26 LAB — BASIC METABOLIC PANEL
BUN: 15 mg/dL (ref 6–23)
CHLORIDE: 104 meq/L (ref 96–112)
CO2: 29 mEq/L (ref 19–32)
CREATININE: 0.99 mg/dL (ref 0.40–1.20)
Calcium: 9.8 mg/dL (ref 8.4–10.5)
GFR: 59.34 mL/min — ABNORMAL LOW (ref >60.00)
GLUCOSE: 109 mg/dL — AB (ref 70–99)
POTASSIUM: 3.6 meq/L (ref 3.5–5.1)
Sodium: 141 mEq/L (ref 135–145)

## 2016-09-27 ENCOUNTER — Other Ambulatory Visit: Payer: Self-pay | Admitting: Internal Medicine

## 2016-09-27 DIAGNOSIS — N179 Acute kidney failure, unspecified: Secondary | ICD-10-CM

## 2016-09-27 DIAGNOSIS — E039 Hypothyroidism, unspecified: Secondary | ICD-10-CM | POA: Diagnosis not present

## 2016-09-27 DIAGNOSIS — I10 Essential (primary) hypertension: Secondary | ICD-10-CM

## 2016-09-27 DIAGNOSIS — Z87891 Personal history of nicotine dependence: Secondary | ICD-10-CM | POA: Diagnosis not present

## 2016-09-27 DIAGNOSIS — Z3202 Encounter for pregnancy test, result negative: Secondary | ICD-10-CM | POA: Diagnosis not present

## 2016-09-27 DIAGNOSIS — Z902 Acquired absence of lung [part of]: Secondary | ICD-10-CM | POA: Diagnosis not present

## 2016-09-27 DIAGNOSIS — C3412 Malignant neoplasm of upper lobe, left bronchus or lung: Secondary | ICD-10-CM | POA: Diagnosis not present

## 2016-09-27 MED ORDER — AMLODIPINE BESYLATE 10 MG PO TABS: 10.0000 mg | ORAL_TABLET | Freq: Every day | ORAL | 3 refills | Status: DC

## 2016-09-27 NOTE — Assessment & Plan Note (Signed)
Decreased GFR resolved with suspension of Maxzide.    Lab Results  Component Value Date   CREATININE 0.99 09/26/2016

## 2016-09-27 NOTE — Assessment & Plan Note (Signed)
maxzide stopped due to rising GFR.  Amlodipine increased to 10 mg  [employer]

## 2016-09-28 DIAGNOSIS — Z3202 Encounter for pregnancy test, result negative: Secondary | ICD-10-CM | POA: Diagnosis not present

## 2016-09-28 DIAGNOSIS — E039 Hypothyroidism, unspecified: Secondary | ICD-10-CM | POA: Diagnosis not present

## 2016-09-28 DIAGNOSIS — Z87891 Personal history of nicotine dependence: Secondary | ICD-10-CM | POA: Diagnosis not present

## 2016-09-28 DIAGNOSIS — C3412 Malignant neoplasm of upper lobe, left bronchus or lung: Secondary | ICD-10-CM | POA: Diagnosis not present

## 2016-09-28 DIAGNOSIS — I10 Essential (primary) hypertension: Secondary | ICD-10-CM | POA: Diagnosis not present

## 2016-09-28 DIAGNOSIS — Z902 Acquired absence of lung [part of]: Secondary | ICD-10-CM | POA: Diagnosis not present

## 2016-10-02 ENCOUNTER — Other Ambulatory Visit: Payer: PRIVATE HEALTH INSURANCE

## 2016-10-02 DIAGNOSIS — H409 Unspecified glaucoma: Secondary | ICD-10-CM | POA: Diagnosis not present

## 2016-10-02 DIAGNOSIS — Z8042 Family history of malignant neoplasm of prostate: Secondary | ICD-10-CM | POA: Diagnosis not present

## 2016-10-02 DIAGNOSIS — Z902 Acquired absence of lung [part of]: Secondary | ICD-10-CM | POA: Diagnosis not present

## 2016-10-02 DIAGNOSIS — Z8249 Family history of ischemic heart disease and other diseases of the circulatory system: Secondary | ICD-10-CM | POA: Diagnosis not present

## 2016-10-02 DIAGNOSIS — M1990 Unspecified osteoarthritis, unspecified site: Secondary | ICD-10-CM | POA: Diagnosis not present

## 2016-10-02 DIAGNOSIS — C3412 Malignant neoplasm of upper lobe, left bronchus or lung: Secondary | ICD-10-CM | POA: Diagnosis not present

## 2016-10-02 DIAGNOSIS — E039 Hypothyroidism, unspecified: Secondary | ICD-10-CM | POA: Diagnosis not present

## 2016-10-02 DIAGNOSIS — Z87891 Personal history of nicotine dependence: Secondary | ICD-10-CM | POA: Diagnosis not present

## 2016-10-02 DIAGNOSIS — Z3202 Encounter for pregnancy test, result negative: Secondary | ICD-10-CM | POA: Diagnosis not present

## 2016-10-02 DIAGNOSIS — I10 Essential (primary) hypertension: Secondary | ICD-10-CM | POA: Diagnosis not present

## 2016-10-03 DIAGNOSIS — C3412 Malignant neoplasm of upper lobe, left bronchus or lung: Secondary | ICD-10-CM | POA: Diagnosis not present

## 2016-10-03 DIAGNOSIS — Z3202 Encounter for pregnancy test, result negative: Secondary | ICD-10-CM | POA: Diagnosis not present

## 2016-10-03 DIAGNOSIS — E039 Hypothyroidism, unspecified: Secondary | ICD-10-CM | POA: Diagnosis not present

## 2016-10-03 DIAGNOSIS — Z87891 Personal history of nicotine dependence: Secondary | ICD-10-CM | POA: Diagnosis not present

## 2016-10-03 DIAGNOSIS — I10 Essential (primary) hypertension: Secondary | ICD-10-CM | POA: Diagnosis not present

## 2016-10-03 DIAGNOSIS — Z902 Acquired absence of lung [part of]: Secondary | ICD-10-CM | POA: Diagnosis not present

## 2016-10-04 DIAGNOSIS — Z3202 Encounter for pregnancy test, result negative: Secondary | ICD-10-CM | POA: Diagnosis not present

## 2016-10-04 DIAGNOSIS — Z902 Acquired absence of lung [part of]: Secondary | ICD-10-CM | POA: Diagnosis not present

## 2016-10-04 DIAGNOSIS — Z87891 Personal history of nicotine dependence: Secondary | ICD-10-CM | POA: Diagnosis not present

## 2016-10-04 DIAGNOSIS — E039 Hypothyroidism, unspecified: Secondary | ICD-10-CM | POA: Diagnosis not present

## 2016-10-04 DIAGNOSIS — I10 Essential (primary) hypertension: Secondary | ICD-10-CM | POA: Diagnosis not present

## 2016-10-04 DIAGNOSIS — C3412 Malignant neoplasm of upper lobe, left bronchus or lung: Secondary | ICD-10-CM | POA: Diagnosis not present

## 2016-10-05 DIAGNOSIS — Z902 Acquired absence of lung [part of]: Secondary | ICD-10-CM | POA: Diagnosis not present

## 2016-10-05 DIAGNOSIS — C3412 Malignant neoplasm of upper lobe, left bronchus or lung: Secondary | ICD-10-CM | POA: Diagnosis not present

## 2016-10-05 DIAGNOSIS — E039 Hypothyroidism, unspecified: Secondary | ICD-10-CM | POA: Diagnosis not present

## 2016-10-05 DIAGNOSIS — Z87891 Personal history of nicotine dependence: Secondary | ICD-10-CM | POA: Diagnosis not present

## 2016-10-05 DIAGNOSIS — I10 Essential (primary) hypertension: Secondary | ICD-10-CM | POA: Diagnosis not present

## 2016-10-05 DIAGNOSIS — Z3202 Encounter for pregnancy test, result negative: Secondary | ICD-10-CM | POA: Diagnosis not present

## 2016-10-08 ENCOUNTER — Encounter: Payer: PRIVATE HEALTH INSURANCE | Admitting: Internal Medicine

## 2016-10-08 DIAGNOSIS — I10 Essential (primary) hypertension: Secondary | ICD-10-CM | POA: Diagnosis not present

## 2016-10-08 DIAGNOSIS — Z902 Acquired absence of lung [part of]: Secondary | ICD-10-CM | POA: Diagnosis not present

## 2016-10-08 DIAGNOSIS — C3412 Malignant neoplasm of upper lobe, left bronchus or lung: Secondary | ICD-10-CM | POA: Diagnosis not present

## 2016-10-08 DIAGNOSIS — Z3202 Encounter for pregnancy test, result negative: Secondary | ICD-10-CM | POA: Diagnosis not present

## 2016-10-08 DIAGNOSIS — E039 Hypothyroidism, unspecified: Secondary | ICD-10-CM | POA: Diagnosis not present

## 2016-10-08 DIAGNOSIS — Z87891 Personal history of nicotine dependence: Secondary | ICD-10-CM | POA: Diagnosis not present

## 2016-10-09 DIAGNOSIS — Z3202 Encounter for pregnancy test, result negative: Secondary | ICD-10-CM | POA: Diagnosis not present

## 2016-10-09 DIAGNOSIS — Z902 Acquired absence of lung [part of]: Secondary | ICD-10-CM | POA: Diagnosis not present

## 2016-10-09 DIAGNOSIS — Z87891 Personal history of nicotine dependence: Secondary | ICD-10-CM | POA: Diagnosis not present

## 2016-10-09 DIAGNOSIS — C3412 Malignant neoplasm of upper lobe, left bronchus or lung: Secondary | ICD-10-CM | POA: Diagnosis not present

## 2016-10-09 DIAGNOSIS — E039 Hypothyroidism, unspecified: Secondary | ICD-10-CM | POA: Diagnosis not present

## 2016-10-09 DIAGNOSIS — J3800 Paralysis of vocal cords and larynx, unspecified: Secondary | ICD-10-CM | POA: Diagnosis not present

## 2016-10-09 DIAGNOSIS — I10 Essential (primary) hypertension: Secondary | ICD-10-CM | POA: Diagnosis not present

## 2016-10-10 DIAGNOSIS — I10 Essential (primary) hypertension: Secondary | ICD-10-CM | POA: Diagnosis not present

## 2016-10-10 DIAGNOSIS — Z3202 Encounter for pregnancy test, result negative: Secondary | ICD-10-CM | POA: Diagnosis not present

## 2016-10-10 DIAGNOSIS — Z902 Acquired absence of lung [part of]: Secondary | ICD-10-CM | POA: Diagnosis not present

## 2016-10-10 DIAGNOSIS — Z87891 Personal history of nicotine dependence: Secondary | ICD-10-CM | POA: Diagnosis not present

## 2016-10-10 DIAGNOSIS — C3412 Malignant neoplasm of upper lobe, left bronchus or lung: Secondary | ICD-10-CM | POA: Diagnosis not present

## 2016-10-10 DIAGNOSIS — E039 Hypothyroidism, unspecified: Secondary | ICD-10-CM | POA: Diagnosis not present

## 2016-10-15 ENCOUNTER — Encounter: Payer: Self-pay | Admitting: Internal Medicine

## 2016-10-16 ENCOUNTER — Encounter: Payer: Self-pay | Admitting: Internal Medicine

## 2016-10-16 ENCOUNTER — Other Ambulatory Visit: Payer: Self-pay | Admitting: Internal Medicine

## 2016-10-16 MED ORDER — METOPROLOL SUCCINATE ER 50 MG PO TB24: 50.0000 mg | ORAL_TABLET | Freq: Every day | ORAL | 0 refills | Status: DC

## 2016-10-19 ENCOUNTER — Encounter: Payer: Self-pay | Admitting: Internal Medicine

## 2016-10-19 DIAGNOSIS — C3492 Malignant neoplasm of unspecified part of left bronchus or lung: Secondary | ICD-10-CM

## 2016-10-19 MED ORDER — LOSARTAN POTASSIUM 50 MG PO TABS: 50.0000 mg | ORAL_TABLET | Freq: Every day | ORAL | 0 refills | Status: DC

## 2016-10-19 NOTE — Telephone Encounter (Signed)
FYI

## 2016-10-23 ENCOUNTER — Other Ambulatory Visit: Payer: Self-pay

## 2016-10-23 ENCOUNTER — Telehealth: Payer: Self-pay

## 2016-10-23 MED ORDER — BUPROPION HCL ER (XL) 300 MG PO TB24: ORAL_TABLET | ORAL | 1 refills | Status: DC

## 2016-10-23 NOTE — Telephone Encounter (Signed)
Patient notified

## 2016-10-23 NOTE — Telephone Encounter (Signed)
Bupropion HCL XL 300 Rx pharmacy changed from [facility] to [facility].... Okay to refill at [facility]. Previous Dr. Gilford Rile patient Last OV 03/2016 Last Refill 03/2016 Please advise

## 2016-10-23 NOTE — Telephone Encounter (Addendum)
refilled,  I have accepted her as patient and have been in contact

## 2016-10-23 NOTE — Addendum Note (Signed)
Addended by: Crecencio Mc on: 10/23/2016 12:05 PM   Modules accepted: Orders

## 2016-10-23 NOTE — Telephone Encounter (Signed)
Change pharmacy from [facility] to [facility] per patient request

## 2016-10-24 ENCOUNTER — Other Ambulatory Visit: Payer: Self-pay | Admitting: Radiology

## 2016-10-30 DIAGNOSIS — Z79899 Other long term (current) drug therapy: Secondary | ICD-10-CM | POA: Diagnosis not present

## 2016-10-30 DIAGNOSIS — J90 Pleural effusion, not elsewhere classified: Secondary | ICD-10-CM | POA: Diagnosis not present

## 2016-10-30 DIAGNOSIS — C3490 Malignant neoplasm of unspecified part of unspecified bronchus or lung: Secondary | ICD-10-CM | POA: Diagnosis not present

## 2016-10-30 DIAGNOSIS — E039 Hypothyroidism, unspecified: Secondary | ICD-10-CM | POA: Diagnosis not present

## 2016-10-30 DIAGNOSIS — F418 Other specified anxiety disorders: Secondary | ICD-10-CM | POA: Diagnosis not present

## 2016-10-30 DIAGNOSIS — R918 Other nonspecific abnormal finding of lung field: Secondary | ICD-10-CM | POA: Diagnosis not present

## 2016-10-30 DIAGNOSIS — C3492 Malignant neoplasm of unspecified part of left bronchus or lung: Secondary | ICD-10-CM | POA: Diagnosis not present

## 2016-10-31 DIAGNOSIS — R1310 Dysphagia, unspecified: Secondary | ICD-10-CM | POA: Diagnosis not present

## 2016-10-31 DIAGNOSIS — T17908S Unspecified foreign body in respiratory tract, part unspecified causing other injury, sequela: Secondary | ICD-10-CM | POA: Diagnosis not present

## 2016-10-31 NOTE — Assessment & Plan Note (Addendum)
S/p left upper lobectomy, ongoing chemo. Being treated at [facility].  Chest CT jan 30 noted new nodules.  Repeat planned in 3 months

## 2016-11-01 ENCOUNTER — Telehealth: Payer: Self-pay | Admitting: Radiology

## 2016-11-01 DIAGNOSIS — I10 Essential (primary) hypertension: Secondary | ICD-10-CM

## 2016-11-01 DIAGNOSIS — R000 Tachycardia, unspecified: Secondary | ICD-10-CM

## 2016-11-01 DIAGNOSIS — R7301 Impaired fasting glucose: Secondary | ICD-10-CM

## 2016-11-01 DIAGNOSIS — E7800 Pure hypercholesterolemia, unspecified: Secondary | ICD-10-CM

## 2016-11-01 NOTE — Telephone Encounter (Signed)
Pt coming for labs Monday, please place orders. Thank you.

## 2016-11-02 ENCOUNTER — Encounter: Payer: Self-pay | Admitting: Internal Medicine

## 2016-11-02 NOTE — Addendum Note (Signed)
Addended by: Crecencio Mc on: 11/02/2016 07:17 AM   Modules accepted: Orders

## 2016-11-02 NOTE — Telephone Encounter (Signed)
Fasting labs ordered

## 2016-11-05 ENCOUNTER — Encounter: Payer: Self-pay | Admitting: Internal Medicine

## 2016-11-05 ENCOUNTER — Other Ambulatory Visit: Payer: Self-pay | Admitting: Internal Medicine

## 2016-11-05 ENCOUNTER — Other Ambulatory Visit (INDEPENDENT_AMBULATORY_CARE_PROVIDER_SITE_OTHER): Payer: Medicare Other

## 2016-11-05 DIAGNOSIS — E7800 Pure hypercholesterolemia, unspecified: Secondary | ICD-10-CM | POA: Diagnosis not present

## 2016-11-05 DIAGNOSIS — I10 Essential (primary) hypertension: Secondary | ICD-10-CM | POA: Diagnosis not present

## 2016-11-05 DIAGNOSIS — D72810 Lymphocytopenia: Secondary | ICD-10-CM

## 2016-11-05 DIAGNOSIS — R7301 Impaired fasting glucose: Secondary | ICD-10-CM

## 2016-11-05 DIAGNOSIS — R000 Tachycardia, unspecified: Secondary | ICD-10-CM | POA: Diagnosis not present

## 2016-11-05 LAB — TSH: TSH: 2.15 u[IU]/mL (ref 0.35–4.50)

## 2016-11-05 LAB — LIPID PANEL
CHOLESTEROL: 179 mg/dL (ref 0–200)
HDL: 51.5 mg/dL (ref >39.00)
LDL CALC: 89 mg/dL (ref 0–99)
NonHDL: 127.2
TRIGLYCERIDES: 190 mg/dL — AB (ref 0.0–149.0)
Total CHOL/HDL Ratio: 3
VLDL: 38 mg/dL (ref 0.0–40.0)

## 2016-11-05 LAB — COMPREHENSIVE METABOLIC PANEL
ALBUMIN: 4.6 g/dL (ref 3.5–5.2)
ALT: 18 U/L (ref 0–35)
AST: 17 U/L (ref 0–37)
Alkaline Phosphatase: 75 U/L (ref 39–117)
BILIRUBIN TOTAL: 0.4 mg/dL (ref 0.2–1.2)
BUN: 16 mg/dL (ref 6–23)
CALCIUM: 10 mg/dL (ref 8.4–10.5)
CHLORIDE: 103 meq/L (ref 96–112)
CO2: 31 meq/L (ref 19–32)
CREATININE: 1.03 mg/dL (ref 0.40–1.20)
GFR: 56.67 mL/min — AB (ref >60.00)
Glucose, Bld: 98 mg/dL (ref 70–99)
Potassium: 4 mEq/L (ref 3.5–5.1)
Sodium: 141 mEq/L (ref 135–145)
Total Protein: 6.9 g/dL (ref 6.0–8.3)

## 2016-11-05 LAB — MICROALBUMIN / CREATININE URINE RATIO
Creatinine,U: 148.5 mg/dL
MICROALB UR: 1.2 mg/dL (ref 0.0–1.9)
MICROALB/CREAT RATIO: 0.8 mg/g (ref 0.0–30.0)

## 2016-11-05 LAB — LDL CHOLESTEROL, DIRECT: LDL DIRECT: 91 mg/dL

## 2016-11-05 LAB — HEMOGLOBIN A1C: HEMOGLOBIN A1C: 5 % (ref 4.6–6.5)

## 2016-11-07 ENCOUNTER — Ambulatory Visit (INDEPENDENT_AMBULATORY_CARE_PROVIDER_SITE_OTHER): Payer: Medicare Other | Admitting: Internal Medicine

## 2016-11-07 ENCOUNTER — Encounter: Payer: Self-pay | Admitting: Internal Medicine

## 2016-11-07 VITALS — BP 140/84 | HR 84 | Resp 16 | Ht 62.0 in | Wt 176.0 lb

## 2016-11-07 DIAGNOSIS — Z780 Asymptomatic menopausal state: Secondary | ICD-10-CM | POA: Diagnosis not present

## 2016-11-07 DIAGNOSIS — I10 Essential (primary) hypertension: Secondary | ICD-10-CM

## 2016-11-07 DIAGNOSIS — E039 Hypothyroidism, unspecified: Secondary | ICD-10-CM

## 2016-11-07 DIAGNOSIS — Z1239 Encounter for other screening for malignant neoplasm of breast: Secondary | ICD-10-CM

## 2016-11-07 DIAGNOSIS — C3492 Malignant neoplasm of unspecified part of left bronchus or lung: Secondary | ICD-10-CM | POA: Diagnosis not present

## 2016-11-07 DIAGNOSIS — Z0000 Encounter for general adult medical examination without abnormal findings: Secondary | ICD-10-CM

## 2016-11-07 DIAGNOSIS — Z1231 Encounter for screening mammogram for malignant neoplasm of breast: Secondary | ICD-10-CM

## 2016-11-07 DIAGNOSIS — J0100 Acute maxillary sinusitis, unspecified: Secondary | ICD-10-CM | POA: Diagnosis not present

## 2016-11-07 DIAGNOSIS — R000 Tachycardia, unspecified: Secondary | ICD-10-CM | POA: Diagnosis not present

## 2016-11-07 MED ORDER — AMOXICILLIN-POT CLAVULANATE 875-125 MG PO TABS: 1.0000 | ORAL_TABLET | Freq: Two times a day (BID) | ORAL | 0 refills | Status: DC

## 2016-11-07 MED ORDER — AZELASTINE HCL 0.1 % NA SOLN: NASAL | 6 refills | Status: DC

## 2016-11-07 MED ORDER — OSELTAMIVIR PHOSPHATE 75 MG PO CAPS: 75.0000 mg | ORAL_CAPSULE | Freq: Every day | ORAL | 0 refills | Status: DC

## 2016-11-07 MED ORDER — LEVOTHYROXINE SODIUM 50 MCG PO TABS: 50.0000 ug | ORAL_TABLET | Freq: Every day | ORAL | 0 refills | Status: DC

## 2016-11-07 NOTE — Progress Notes (Signed)
Patient ID: Stacy Santana, female    DOB: 1951-09-20  Age: 66 y.o. MRN: [id number]  The patient is here for annual Medicare wellness examination and management of other chronic and acute problems.    Deferring mammog , just a a chest PET scan Hair still falling out Due for DEXA   Home readings on BP    The risk factors are reflected in the social history.  The roster of all physicians providing medical care to patient - is listed in the Snapshot section of the chart.  Activities of daily living:  The patient is 100% independent in all ADLs: dressing, toileting, feeding as well as independent mobility  Home safety : The patient has smoke detectors in the home. They wear seatbelts.  There are no firearms at home. There is no violence in the home.   There is no risks for hepatitis, STDs or HIV. There is no   history of blood transfusion. They have no travel history to infectious disease endemic areas of the world.  The patient has seen their dentist in the last six month. They have seen their eye doctor in the last year. They have no  hearing difficulty with regard to whispered voices and some television programs.  They have deferred audiologic testing in the last year.  They do not  have excessive sun exposure. Discussed the need for sun protection: hats, long sleeves and use of sunscreen if there is significant sun exposure.   Diet: the importance of a healthy diet is discussed. They do have a healthy diet.  The benefits of regular aerobic exercise were discussed. She is not exercising due to active treatment for lung cancer.  Depression screen: there are no signs or vegative symptoms of depression- irritability, change in appetite, anhedonia, sadness/tearfullness.  Cognitive assessment: the patient manages all their financial and personal affairs and is actively engaged. They could relate day,date,year and events; recalled 2/3 objects at 3 minutes; performed clock-face test  normally.  The following portions of the patient's history were reviewed and updated as appropriate: allergies, current medications, past family history, past medical history,  past surgical history, past social history  and problem list.  Visual acuity was not assessed per patient preference since she has regular follow up with her ophthalmologist. Hearing and body mass index were assessed and reviewed.   During the course of the visit the patient was educated and counseled about appropriate screening and preventive services including : fall prevention , diabetes screening, nutrition counseling, colorectal cancer screening, and recommended immunizations.    CC: The primary encounter diagnosis was Encounter for Medicare annual wellness exam. Diagnoses of Postmenopausal estrogen deficiency, Tachycardia, Essential hypertension, Screening for breast cancer, Acquired hypothyroidism, Acute non-recurrent maxillary sinusitis, and Adenocarcinoma of left lung ([place]) were also pertinent to this visit.  persistent cough,  pulm ordered swallow study,  ENT voice specialist looked at vocal cords. Improving movement but stil having swallowing difficulty, might be having aspiration. Vs radiation .  Has resumed claritin last week, for PND, somewhat better.  Cough is productive of clear drainage, also having  right maxillary sinus pain and pressure.  Lymphopenia   Noted last week on labs.  acute drop ,  To be expected  Post xrt      Lab Results  Component Value Date   TSH 2.15 11/05/2016      HistoryMartha has a past medical history of Allergy; Carpal tunnel syndrome; Degenerative joint disease; Depression; Endometriosis (1989); Glaucoma; HSV infection; Hypertension; Osteoporosis;  and Vitamin D deficiency.   She has a past surgical history that includes Nasal sinus surgery (2012); laparoscopy (1999); Appendectomy (1964); Tonsillectomy; Tubal ligation (1992); Carpal tunnel release (Right, 01/2014); and Foot neuroma  surgery (May 2016).   Her family history includes ADD / ADHD in her son and son; Anxiety disorder in her son; Arthritis in her mother; COPD in her maternal grandfather and mother; Cancer in her father; Depression in her maternal uncle and son; Heart disease in her maternal grandmother; Hypertension in her mother; Hypertrophic cardiomyopathy in her sister; Osteoporosis in her mother; Rheum arthritis in her mother; Schizophrenia in her paternal aunt; Stroke in her mother.She reports that she quit smoking about 33 years ago. Her smoking use included Cigarettes. She has a 16.00 pack-year smoking history. She has never used smokeless tobacco. She reports that she drinks alcohol. She reports that she does not use drugs.  Outpatient Medications Prior to Visit  Medication Sig Dispense Refill  . buPROPion (WELLBUTRIN XL) 300 MG 24 hr tablet TAKE 1 TABLET(300 MG) BY MOUTH DAILY 90 tablet 1  . loratadine (CLARITIN) 10 MG tablet Take 10 mg by mouth daily.      [name] losartan (COZAAR) 50 MG tablet Take 1 tablet (50 mg total) by mouth daily. (Patient taking differently: Take 100 mg by mouth daily. ) 90 tablet 0  . metoprolol succinate (TOPROL-XL) 50 MG 24 hr tablet Take 1 tablet (50 mg total) by mouth daily. 90 tablet 0  . Multiple Vitamin (MULTIVITAMIN) tablet Take 1 tablet by mouth daily.    . Probiotic Product (PROBIOTIC DAILY PO) Take by mouth.    . travoprost, benzalkonium, (TRAVATAN) 0.004 % ophthalmic solution Place 2 drops into both eyes at bedtime.    . triamcinolone (NASACORT) 55 MCG/ACT AERO nasal inhaler Place into the nose.    [name] azelastine (ASTELIN) 0.1 % nasal spray PLACE 1 SPRAY INTO BOTH NOSTRILS AT BEDTIME AS NEEDED 30 mL 6  . levothyroxine (SYNTHROID, LEVOTHROID) 50 MCG tablet TAKE 1 TABLET BY MOUTH DAILY 90 tablet 0  . albuterol (PROVENTIL HFA;VENTOLIN HFA) 108 (90 Base) MCG/ACT inhaler Inhale into the lungs.    [name] amLODipine (NORVASC) 10 MG tablet Take 1 tablet (10 mg total) by mouth daily.  (Patient not taking: Reported on 11/07/2016) 90 tablet 3  . Cholecalciferol (VITAMIN D PO) Take 1 tablet by mouth daily.      . clonazePAM (KLONOPIN) 0.5 MG tablet Take 1 tablet (0.5 mg total) by mouth 2 (two) times daily as needed for anxiety. (Patient not taking: Reported on 11/07/2016) 60 tablet 1  . gabapentin (NEURONTIN) 300 MG capsule Take 300 mg by mouth.    . Naproxen Sodium 220 MG CAPS Take 2 capsules by mouth daily.      . Omega-3 Fatty Acids (FISH OIL PO) Take 1 capsule by mouth daily.      . sodium chloride 0.9 % nebulizer solution     . azithromycin (ZITHROMAX) 250 MG tablet Tale 500 mg PO on day 1, then 250 mg PO q24h x 4 days. (Patient not taking: Reported on 11/07/2016) 6 tablet 0   No facility-administered medications prior to visit.     Review of Systems   Patient denies, fevers, malaise, unintentional weight loss, skin rash, eye pain,  sore throat, dysphagia,  hemoptysis , dyspnea, wheezing, chest pain, palpitations, orthopnea, edema, abdominal pain, nausea, melena, diarrhea, constipation, flank pain, dysuria, hematuria, urinary  Frequency, nocturia, numbness, tingling, seizures,  Focal weakness, Loss of consciousness,  Tremor, insomnia,  depression, anxiety, and suicidal ideation.      Objective:  BP 140/84   Pulse 84   Resp 16   Ht '5\' 2"'$  (1.575 m)   Wt 176 lb (79.8 kg)   SpO2 97%   BMI 32.19 kg/m   Physical Exam   General appearance: alert, cooperative and appears stated age Head: Normocephalic, without obvious abnormality, atraumatic. Maxillary sinus pain  Eyes: conjunctivae/corneas clear. PERRL, EOM's intact. Fundi benign. Ears: normal TM's and external ear canals both ears Nose: Nares normal. Septum midline. Mucosa normal. No drainage or sinus tenderness. Throat: lips, mucosa, and tongue normal; teeth and gums normal Neck: no adenopathy, no carotid bruit, no JVD, supple, symmetrical, trachea midline and thyroid not enlarged, symmetric, no  tenderness/mass/nodules Lungs: clear to auscultation bilaterally Breasts: normal appearance, no masses or tenderness Heart: regular rate and rhythm, S1, S2 normal, no murmur, click, rub or gallop Abdomen: soft, non-tender; bowel sounds normal; no masses,  no organomegaly Extremities: extremities normal, atraumatic, no cyanosis or edema Pulses: 2+ and symmetric Skin: Skin color, texture, turgor normal. No rashes or lesions Neurologic: Alert and oriented X 3, normal strength and tone. Normal symmetric reflexes. Normal coordination and gait.      Assessment & Plan:   Problem List Items Addressed This Visit    Adenocarcinoma, lung ([place]) (Chronic)    S/p left upper lobectomy, ongoing chemo/XRT. Being treated at [facility].  Chest CT jan 30 noted new nodules.  Repeat planned in 3 months       Relevant Medications   oseltamivir (TAMIFLU) 75 MG capsule   amoxicillin-clavulanate (AUGMENTIN) 875-125 MG tablet   Encounter for Medicare annual wellness exam - Primary    Annual comprehensive preventive exam was done as well as an evaluation and management of chronic conditions .  During the course of the visit the patient was educated and counseled about appropriate screening and preventive services including :  diabetes screening, lipid analysis with projected  10 year  risk for CAD , nutrition counseling, breast, cervical and colorectal cancer screening, and recommended immunizations.  Printed recommendations for health maintenance screenings was give      Hypertension (Chronic)    Continue losartan.  increase metoprolol to 75 mg .  Lab Results  Component Value Date   CREATININE 1.03 11/05/2016   Lab Results  Component Value Date   NA 141 11/05/2016   K 4.0 11/05/2016   CL 103 11/05/2016   CO2 31 11/05/2016         Hypothyroidism    Thyroid function is WNL on current dose.  No current changes needed.   Lab Results  Component Value Date   TSH 2.15 11/05/2016         Relevant  Medications   levothyroxine (SYNTHROID, LEVOTHROID) 50 MCG tablet   Screening for breast cancer    Breast exam done,  Mammogram deferred since she just had a full body PET scan.       Sinusitis, acute, maxillary    sinus drainage and congestion with cough productive of purulent sputum and headaches for the last 10 days.  Patient  has used multiple OTD decongestants,  Sinus rinses,  advil and tylenol along with otc cough suppressants without improvement.  Starting antibiotic for exam concerning for infection       Relevant Medications   azelastine (ASTELIN) 0.1 % nasal spray   oseltamivir (TAMIFLU) 75 MG capsule   amoxicillin-clavulanate (AUGMENTIN) 875-125 MG tablet   Tachycardia    Continue metoprolol, increase dose  to 75 mg ,  Then 100 mg if needed to keep pulse < 90 and bp < 130/80       Other Visit Diagnoses    Postmenopausal estrogen deficiency       Relevant Orders   DG Bone Density      I have discontinued Ms. Grawe's azithromycin. I have also changed her levothyroxine. Additionally, I am having her start on oseltamivir and amoxicillin-clavulanate. Lastly, I am having her maintain her loratadine, Naproxen Sodium, Cholecalciferol (VITAMIN D PO), Omega-3 Fatty Acids (FISH OIL PO), travoprost (benzalkonium), multivitamin, Probiotic Product (PROBIOTIC DAILY PO), clonazePAM, albuterol, gabapentin, sodium chloride, triamcinolone, amLODipine, metoprolol succinate, losartan, buPROPion, and azelastine.  Meds ordered this encounter  Medications  . levothyroxine (SYNTHROID, LEVOTHROID) 50 MCG tablet    Sig: Take 1 tablet (50 mcg total) by mouth daily.    Dispense:  90 tablet    Refill:  0  . azelastine (ASTELIN) 0.1 % nasal spray    Sig: PLACE 1 SPRAY INTO BOTH NOSTRILS AT BEDTIME AS NEEDED    Dispense:  30 mL    Refill:  6  . oseltamivir (TAMIFLU) 75 MG capsule    Sig: Take 1 capsule (75 mg total) by mouth daily.    Dispense:  10 capsule    Refill:  0  . amoxicillin-clavulanate  (AUGMENTIN) 875-125 MG tablet    Sig: Take 1 tablet by mouth 2 (two) times daily.    Dispense:  28 tablet    Refill:  0    Medications Discontinued During This Encounter  Medication Reason  . azithromycin (ZITHROMAX) 250 MG tablet Completed Course  . levothyroxine (SYNTHROID, LEVOTHROID) 50 MCG tablet Reorder  . azelastine (ASTELIN) 0.1 % nasal spray Reorder    Follow-up: No Follow-up on file.   Crecencio Mc, MD

## 2016-11-07 NOTE — Patient Instructions (Addendum)
Continue 100 mg losartan  Try increasing  the metoprolol  to 75 mg or  100 mg   Try adding 12.5 mg benadryl at bedtime for managing the post nasal drip   Trial of augmentin for 14 days for sinusitis TAKE YOUR PROBIOTIC   USE NSAIDS SPARINGLY (KIDNEY FUNCTION)  INCREASE YOUR FLUID INTAKE WITH MILK, ALMOND MILK   REPEAT THE CBC AROUND  MARCH 5  DEXA SCAN ORDERED  YOU MAY NEED THE PNEUMOVAX  [phone]

## 2016-11-07 NOTE — Progress Notes (Signed)
Pre visit review using our clinic review tool, if applicable. No additional management support is needed unless otherwise documented below in the visit note. 

## 2016-11-10 DIAGNOSIS — J0100 Acute maxillary sinusitis, unspecified: Secondary | ICD-10-CM | POA: Insufficient documentation

## 2016-11-10 NOTE — Assessment & Plan Note (Signed)
Annual comprehensive preventive exam was done as well as an evaluation and management of chronic conditions .  During the course of the visit the patient was educated and counseled about appropriate screening and preventive services including :  diabetes screening, lipid analysis with projected  10 year  risk for CAD , nutrition counseling, breast, cervical and colorectal cancer screening, and recommended immunizations.  Printed recommendations for health maintenance screenings was give 

## 2016-11-10 NOTE — Assessment & Plan Note (Addendum)
Continue metoprolol, increase dose to 75 mg ,  Then 100 mg if needed to keep pulse < 90 and bp < 130/80

## 2016-11-10 NOTE — Assessment & Plan Note (Signed)
Continue losartan.  increase metoprolol to 75 mg .  Lab Results  Component Value Date   CREATININE 1.03 11/05/2016   Lab Results  Component Value Date   NA 141 11/05/2016   K 4.0 11/05/2016   CL 103 11/05/2016   CO2 31 11/05/2016

## 2016-11-10 NOTE — Assessment & Plan Note (Signed)
Breast exam done,  Mammogram deferred since she just had a full body PET scan.

## 2016-11-10 NOTE — Assessment & Plan Note (Signed)
Thyroid function is WNL on current dose.  No current changes needed.   Lab Results  Component Value Date   TSH 2.15 11/05/2016

## 2016-11-10 NOTE — Assessment & Plan Note (Signed)
S/p left upper lobectomy, ongoing chemo/XRT. Being treated at [facility].  Chest CT jan 30 noted new nodules.  Repeat planned in 3 months

## 2016-11-10 NOTE — Assessment & Plan Note (Signed)
sinus drainage and congestion with cough productive of purulent sputum and headaches for the last 10 days.  Patient  has used multiple OTD decongestants,  Sinus rinses,  advil and tylenol along with otc cough suppressants without improvement.  Starting antibiotic for exam concerning for infection

## 2016-11-15 DIAGNOSIS — R1310 Dysphagia, unspecified: Secondary | ICD-10-CM | POA: Diagnosis not present

## 2016-11-15 DIAGNOSIS — R633 Feeding difficulties: Secondary | ICD-10-CM | POA: Diagnosis not present

## 2016-11-25 ENCOUNTER — Encounter: Payer: Self-pay | Admitting: Internal Medicine

## 2016-11-26 ENCOUNTER — Other Ambulatory Visit: Payer: Self-pay | Admitting: *Deleted

## 2016-11-26 MED ORDER — AZELASTINE HCL 0.1 % NA SOLN: NASAL | 6 refills | Status: DC

## 2016-12-03 ENCOUNTER — Other Ambulatory Visit (INDEPENDENT_AMBULATORY_CARE_PROVIDER_SITE_OTHER): Payer: Medicare Other

## 2016-12-03 DIAGNOSIS — D72810 Lymphocytopenia: Secondary | ICD-10-CM

## 2016-12-03 LAB — CBC WITH DIFFERENTIAL/PLATELET
BASOS ABS: 0 10*3/uL (ref 0.0–0.1)
BASOS PCT: 0.7 % (ref 0.0–3.0)
EOS PCT: 3.3 % (ref 0.0–5.0)
Eosinophils Absolute: 0.2 10*3/uL (ref 0.0–0.7)
HEMATOCRIT: 37.3 % (ref 36.0–46.0)
Hemoglobin: 12.8 g/dL (ref 12.0–15.0)
LYMPHS ABS: 0.6 10*3/uL — AB (ref 0.7–4.0)
LYMPHS PCT: 12 % (ref 12.0–46.0)
MCHC: 34.2 g/dL (ref 30.0–36.0)
MCV: 90.8 fl (ref 78.0–100.0)
MONOS PCT: 13 % — AB (ref 3.0–12.0)
Monocytes Absolute: 0.7 10*3/uL (ref 0.1–1.0)
NEUTROS ABS: 3.8 10*3/uL (ref 1.4–7.7)
Neutrophils Relative %: 71 % (ref 43.0–77.0)
PLATELETS: 312 10*3/uL (ref 150.0–400.0)
RBC: 4.11 Mil/uL (ref 3.87–5.11)
RDW: 12 % (ref 11.5–15.5)
WBC: 5.3 10*3/uL (ref 4.0–10.5)

## 2016-12-04 ENCOUNTER — Encounter: Payer: Self-pay | Admitting: Internal Medicine

## 2016-12-06 ENCOUNTER — Encounter: Payer: Self-pay | Admitting: Internal Medicine

## 2016-12-07 ENCOUNTER — Other Ambulatory Visit: Payer: Self-pay | Admitting: *Deleted

## 2016-12-07 MED ORDER — LOSARTAN POTASSIUM 100 MG PO TABS: 100.0000 mg | ORAL_TABLET | Freq: Every day | ORAL | 3 refills | Status: DC

## 2016-12-07 NOTE — Telephone Encounter (Signed)
New Rx for Losartan 10 mg sent. See meds.  See 12/06/16 pt email and Dr. Lupita Dawn note 10/16/16 patient email.

## 2016-12-24 ENCOUNTER — Other Ambulatory Visit: Payer: Self-pay | Admitting: Internal Medicine

## 2017-01-08 ENCOUNTER — Ambulatory Visit
Admission: RE | Admit: 2017-01-08 | Discharge: 2017-01-08 | Disposition: A | Discharge status: Home / Self Care | Payer: Medicare Other | Source: Ambulatory Visit | Attending: Internal Medicine | Admitting: Internal Medicine

## 2017-01-08 DIAGNOSIS — M85852 Other specified disorders of bone density and structure, left thigh: Secondary | ICD-10-CM | POA: Diagnosis not present

## 2017-01-08 DIAGNOSIS — Z780 Asymptomatic menopausal state: Secondary | ICD-10-CM | POA: Diagnosis not present

## 2017-01-08 DIAGNOSIS — M8588 Other specified disorders of bone density and structure, other site: Secondary | ICD-10-CM | POA: Insufficient documentation

## 2017-01-10 ENCOUNTER — Encounter: Payer: Self-pay | Admitting: Internal Medicine

## 2017-01-10 DIAGNOSIS — R911 Solitary pulmonary nodule: Secondary | ICD-10-CM | POA: Diagnosis not present

## 2017-01-10 DIAGNOSIS — R05 Cough: Secondary | ICD-10-CM | POA: Diagnosis not present

## 2017-01-10 DIAGNOSIS — J309 Allergic rhinitis, unspecified: Secondary | ICD-10-CM | POA: Diagnosis not present

## 2017-01-22 DIAGNOSIS — Z87891 Personal history of nicotine dependence: Secondary | ICD-10-CM | POA: Diagnosis not present

## 2017-01-22 DIAGNOSIS — Z9221 Personal history of antineoplastic chemotherapy: Secondary | ICD-10-CM | POA: Diagnosis not present

## 2017-01-22 DIAGNOSIS — Z9889 Other specified postprocedural states: Secondary | ICD-10-CM | POA: Diagnosis not present

## 2017-01-22 DIAGNOSIS — H409 Unspecified glaucoma: Secondary | ICD-10-CM | POA: Diagnosis not present

## 2017-01-22 DIAGNOSIS — C3490 Malignant neoplasm of unspecified part of unspecified bronchus or lung: Secondary | ICD-10-CM | POA: Diagnosis not present

## 2017-01-22 DIAGNOSIS — I10 Essential (primary) hypertension: Secondary | ICD-10-CM | POA: Diagnosis not present

## 2017-01-22 DIAGNOSIS — R499 Unspecified voice and resonance disorder: Secondary | ICD-10-CM | POA: Diagnosis not present

## 2017-01-22 DIAGNOSIS — C3412 Malignant neoplasm of upper lobe, left bronchus or lung: Secondary | ICD-10-CM | POA: Diagnosis not present

## 2017-01-22 DIAGNOSIS — C3492 Malignant neoplasm of unspecified part of left bronchus or lung: Secondary | ICD-10-CM | POA: Diagnosis not present

## 2017-01-22 DIAGNOSIS — Z902 Acquired absence of lung [part of]: Secondary | ICD-10-CM | POA: Diagnosis not present

## 2017-01-22 DIAGNOSIS — M1990 Unspecified osteoarthritis, unspecified site: Secondary | ICD-10-CM | POA: Diagnosis not present

## 2017-01-22 DIAGNOSIS — R918 Other nonspecific abnormal finding of lung field: Secondary | ICD-10-CM | POA: Diagnosis not present

## 2017-01-22 DIAGNOSIS — F329 Major depressive disorder, single episode, unspecified: Secondary | ICD-10-CM | POA: Diagnosis not present

## 2017-01-22 DIAGNOSIS — E039 Hypothyroidism, unspecified: Secondary | ICD-10-CM | POA: Diagnosis not present

## 2017-03-05 DIAGNOSIS — H2513 Age-related nuclear cataract, bilateral: Secondary | ICD-10-CM | POA: Diagnosis not present

## 2017-03-26 DIAGNOSIS — R490 Dysphonia: Secondary | ICD-10-CM | POA: Diagnosis not present

## 2017-03-26 DIAGNOSIS — J3801 Paralysis of vocal cords and larynx, unilateral: Secondary | ICD-10-CM | POA: Diagnosis not present

## 2017-04-16 ENCOUNTER — Ambulatory Visit: Admission: RE | Admit: 2017-04-16 | Discharge: 2017-04-30 | Disposition: A | Discharge status: Home / Self Care

## 2017-04-16 ENCOUNTER — Ambulatory Visit
Admission: RE | Admit: 2017-04-16 | Discharge: 2017-04-30 | Disposition: A | Discharge status: Home / Self Care | Payer: MEDICARE

## 2017-04-16 DIAGNOSIS — C3492 Malignant neoplasm of unspecified part of left bronchus or lung: Principal | ICD-10-CM

## 2017-04-16 DIAGNOSIS — C3412 Malignant neoplasm of upper lobe, left bronchus or lung: Principal | ICD-10-CM

## 2017-04-16 DIAGNOSIS — Z902 Acquired absence of lung [part of]: Secondary | ICD-10-CM | POA: Diagnosis not present

## 2017-04-16 DIAGNOSIS — H409 Unspecified glaucoma: Secondary | ICD-10-CM | POA: Diagnosis not present

## 2017-04-16 DIAGNOSIS — Z8249 Family history of ischemic heart disease and other diseases of the circulatory system: Secondary | ICD-10-CM | POA: Diagnosis not present

## 2017-04-16 DIAGNOSIS — I10 Essential (primary) hypertension: Secondary | ICD-10-CM | POA: Diagnosis not present

## 2017-04-16 DIAGNOSIS — E039 Hypothyroidism, unspecified: Secondary | ICD-10-CM | POA: Diagnosis not present

## 2017-04-16 DIAGNOSIS — R918 Other nonspecific abnormal finding of lung field: Secondary | ICD-10-CM | POA: Diagnosis not present

## 2017-04-16 DIAGNOSIS — Z8042 Family history of malignant neoplasm of prostate: Secondary | ICD-10-CM | POA: Diagnosis not present

## 2017-04-16 DIAGNOSIS — M1990 Unspecified osteoarthritis, unspecified site: Secondary | ICD-10-CM | POA: Diagnosis not present

## 2017-04-16 DIAGNOSIS — R938 Abnormal findings on diagnostic imaging of other specified body structures: Secondary | ICD-10-CM | POA: Diagnosis not present

## 2017-04-16 DIAGNOSIS — Z3202 Encounter for pregnancy test, result negative: Secondary | ICD-10-CM | POA: Diagnosis not present

## 2017-04-16 DIAGNOSIS — Z87891 Personal history of nicotine dependence: Secondary | ICD-10-CM | POA: Diagnosis not present

## 2017-04-24 ENCOUNTER — Encounter: Payer: Self-pay | Admitting: Internal Medicine

## 2017-04-25 ENCOUNTER — Ambulatory Visit
Admission: RE | Admit: 2017-04-25 | Discharge: 2017-04-25 | Disposition: A | Discharge status: Home / Self Care | Payer: MEDICARE

## 2017-04-25 DIAGNOSIS — J8410 Pulmonary fibrosis, unspecified: Secondary | ICD-10-CM | POA: Diagnosis not present

## 2017-04-25 DIAGNOSIS — R05 Cough: Secondary | ICD-10-CM | POA: Diagnosis not present

## 2017-04-25 DIAGNOSIS — R061 Stridor: Secondary | ICD-10-CM | POA: Diagnosis not present

## 2017-04-25 DIAGNOSIS — C3412 Malignant neoplasm of upper lobe, left bronchus or lung: Secondary | ICD-10-CM | POA: Diagnosis not present

## 2017-04-25 DIAGNOSIS — J309 Allergic rhinitis, unspecified: Secondary | ICD-10-CM | POA: Diagnosis not present

## 2017-04-25 DIAGNOSIS — Z9221 Personal history of antineoplastic chemotherapy: Secondary | ICD-10-CM | POA: Diagnosis not present

## 2017-04-25 MED ORDER — ALBUTEROL SULFATE HFA 90 MCG/ACTUATION AEROSOL INHALER
Freq: Four times a day (QID) | RESPIRATORY_TRACT | 11 refills | 0 days | Status: CP | PRN
Start: 2017-04-25 — End: 2018-10-07

## 2017-04-26 ENCOUNTER — Ambulatory Visit: Admission: RE | Admit: 2017-04-26 | Discharge: 2017-04-26 | Disposition: A | Discharge status: Home / Self Care

## 2017-04-26 DIAGNOSIS — C3492 Malignant neoplasm of unspecified part of left bronchus or lung: Principal | ICD-10-CM

## 2017-04-26 DIAGNOSIS — Z923 Personal history of irradiation: Secondary | ICD-10-CM | POA: Diagnosis not present

## 2017-04-26 DIAGNOSIS — R918 Other nonspecific abnormal finding of lung field: Secondary | ICD-10-CM | POA: Diagnosis not present

## 2017-04-26 DIAGNOSIS — Z9889 Other specified postprocedural states: Secondary | ICD-10-CM | POA: Diagnosis not present

## 2017-04-27 ENCOUNTER — Telehealth: Payer: Self-pay | Admitting: Internal Medicine

## 2017-04-27 MED ORDER — CLONAZEPAM 0.5 MG PO TABS: 0.5000 mg | ORAL_TABLET | Freq: Two times a day (BID) | ORAL | 1 refills | Status: DC | PRN

## 2017-04-27 NOTE — Telephone Encounter (Signed)
Patient has just found out her lung cancer has returned.  Please make sure the clonazepam rx gets printed on Monday and sent to her pharmacy.  Thanks

## 2017-04-29 MED ORDER — CLONAZEPAM 0.5 MG PO TABS: 0.5000 mg | ORAL_TABLET | Freq: Two times a day (BID) | ORAL | 1 refills | Status: DC | PRN

## 2017-04-29 NOTE — Telephone Encounter (Signed)
Medication faxed

## 2017-04-30 ENCOUNTER — Ambulatory Visit
Admission: RE | Admit: 2017-04-30 | Discharge: 2017-04-30 | Disposition: A | Discharge status: Home / Self Care | Payer: MEDICARE

## 2017-04-30 ENCOUNTER — Ambulatory Visit
Admission: RE | Admit: 2017-04-30 | Discharge: 2017-04-30 | Disposition: A | Discharge status: Home / Self Care | Attending: Surgery | Admitting: Surgery

## 2017-04-30 DIAGNOSIS — R05 Cough: Secondary | ICD-10-CM | POA: Diagnosis not present

## 2017-04-30 DIAGNOSIS — J984 Other disorders of lung: Secondary | ICD-10-CM | POA: Diagnosis not present

## 2017-04-30 DIAGNOSIS — R911 Solitary pulmonary nodule: Secondary | ICD-10-CM | POA: Diagnosis not present

## 2017-04-30 MED ORDER — AMOXICILLIN 875 MG-POTASSIUM CLAVULANATE 125 MG TABLET
ORAL_TABLET | Freq: Two times a day (BID) | ORAL | 0 refills | 0.00000 days | Status: CP
Start: 2017-04-30 — End: 2018-01-06

## 2017-05-06 ENCOUNTER — Ambulatory Visit: Payer: PRIVATE HEALTH INSURANCE | Admitting: Internal Medicine

## 2017-05-06 ENCOUNTER — Other Ambulatory Visit: Payer: Self-pay | Admitting: Internal Medicine

## 2017-05-31 ENCOUNTER — Ambulatory Visit
Admission: RE | Admit: 2017-05-31 | Discharge: 2017-05-31 | Disposition: A | Discharge status: Home / Self Care | Payer: MEDICARE

## 2017-05-31 DIAGNOSIS — C3492 Malignant neoplasm of unspecified part of left bronchus or lung: Secondary | ICD-10-CM | POA: Diagnosis not present

## 2017-06-04 DIAGNOSIS — C3490 Malignant neoplasm of unspecified part of unspecified bronchus or lung: Secondary | ICD-10-CM | POA: Diagnosis not present

## 2017-06-05 ENCOUNTER — Encounter: Payer: Self-pay | Admitting: Internal Medicine

## 2017-06-05 DIAGNOSIS — Z01818 Encounter for other preprocedural examination: Principal | ICD-10-CM

## 2017-06-05 DIAGNOSIS — C3490 Malignant neoplasm of unspecified part of unspecified bronchus or lung: Secondary | ICD-10-CM | POA: Diagnosis not present

## 2017-06-05 DIAGNOSIS — I10 Essential (primary) hypertension: Secondary | ICD-10-CM | POA: Diagnosis not present

## 2017-06-05 DIAGNOSIS — K219 Gastro-esophageal reflux disease without esophagitis: Secondary | ICD-10-CM | POA: Diagnosis not present

## 2017-06-05 DIAGNOSIS — E039 Hypothyroidism, unspecified: Secondary | ICD-10-CM | POA: Diagnosis not present

## 2017-06-06 ENCOUNTER — Inpatient Hospital Stay
Admission: RE | Admit: 2017-06-06 | Discharge: 2017-06-08 | Disposition: A | Discharge status: Home / Self Care | Attending: Surgery | Admitting: Surgery

## 2017-06-06 ENCOUNTER — Inpatient Hospital Stay
Admission: RE | Admit: 2017-06-06 | Discharge: 2017-06-08 | Disposition: A | Discharge status: Home / Self Care | Payer: MEDICARE

## 2017-06-06 ENCOUNTER — Inpatient Hospital Stay
Admission: RE | Admit: 2017-06-06 | Discharge: 2017-06-08 | Disposition: A | Discharge status: Home / Self Care | Attending: Surgery

## 2017-06-06 DIAGNOSIS — M170 Bilateral primary osteoarthritis of knee: Secondary | ICD-10-CM | POA: Diagnosis present

## 2017-06-06 DIAGNOSIS — Z902 Acquired absence of lung [part of]: Secondary | ICD-10-CM | POA: Diagnosis not present

## 2017-06-06 DIAGNOSIS — T451X5A Adverse effect of antineoplastic and immunosuppressive drugs, initial encounter: Secondary | ICD-10-CM | POA: Diagnosis present

## 2017-06-06 DIAGNOSIS — J984 Other disorders of lung: Secondary | ICD-10-CM | POA: Diagnosis present

## 2017-06-06 DIAGNOSIS — C3490 Malignant neoplasm of unspecified part of unspecified bronchus or lung: Secondary | ICD-10-CM | POA: Diagnosis not present

## 2017-06-06 DIAGNOSIS — J9811 Atelectasis: Secondary | ICD-10-CM | POA: Diagnosis not present

## 2017-06-06 DIAGNOSIS — J90 Pleural effusion, not elsewhere classified: Secondary | ICD-10-CM | POA: Diagnosis not present

## 2017-06-06 DIAGNOSIS — I10 Essential (primary) hypertension: Secondary | ICD-10-CM | POA: Diagnosis present

## 2017-06-06 DIAGNOSIS — Z9221 Personal history of antineoplastic chemotherapy: Secondary | ICD-10-CM | POA: Diagnosis not present

## 2017-06-06 DIAGNOSIS — J309 Allergic rhinitis, unspecified: Secondary | ICD-10-CM | POA: Diagnosis present

## 2017-06-06 DIAGNOSIS — Z85118 Personal history of other malignant neoplasm of bronchus and lung: Secondary | ICD-10-CM | POA: Diagnosis not present

## 2017-06-06 DIAGNOSIS — C3432 Malignant neoplasm of lower lobe, left bronchus or lung: Secondary | ICD-10-CM | POA: Diagnosis present

## 2017-06-06 DIAGNOSIS — E669 Obesity, unspecified: Secondary | ICD-10-CM | POA: Diagnosis present

## 2017-06-06 DIAGNOSIS — J939 Pneumothorax, unspecified: Secondary | ICD-10-CM | POA: Diagnosis not present

## 2017-06-06 DIAGNOSIS — Z8669 Personal history of other diseases of the nervous system and sense organs: Secondary | ICD-10-CM | POA: Diagnosis not present

## 2017-06-06 DIAGNOSIS — Z87891 Personal history of nicotine dependence: Secondary | ICD-10-CM | POA: Diagnosis not present

## 2017-06-06 DIAGNOSIS — Z923 Personal history of irradiation: Secondary | ICD-10-CM | POA: Diagnosis not present

## 2017-06-06 DIAGNOSIS — J95811 Postprocedural pneumothorax: Secondary | ICD-10-CM | POA: Diagnosis not present

## 2017-06-06 DIAGNOSIS — C3492 Malignant neoplasm of unspecified part of left bronchus or lung: Secondary | ICD-10-CM | POA: Diagnosis not present

## 2017-06-06 DIAGNOSIS — J320 Chronic maxillary sinusitis: Secondary | ICD-10-CM | POA: Diagnosis present

## 2017-06-06 DIAGNOSIS — C7802 Secondary malignant neoplasm of left lung: Secondary | ICD-10-CM | POA: Diagnosis not present

## 2017-06-06 DIAGNOSIS — E039 Hypothyroidism, unspecified: Secondary | ICD-10-CM | POA: Diagnosis present

## 2017-06-06 DIAGNOSIS — Z6833 Body mass index (BMI) 33.0-33.9, adult: Secondary | ICD-10-CM | POA: Diagnosis not present

## 2017-06-06 DIAGNOSIS — R918 Other nonspecific abnormal finding of lung field: Secondary | ICD-10-CM | POA: Diagnosis not present

## 2017-06-06 DIAGNOSIS — Z9889 Other specified postprocedural states: Secondary | ICD-10-CM | POA: Diagnosis not present

## 2017-06-06 DIAGNOSIS — F341 Dysthymic disorder: Secondary | ICD-10-CM | POA: Diagnosis present

## 2017-06-06 DIAGNOSIS — Z4682 Encounter for fitting and adjustment of non-vascular catheter: Secondary | ICD-10-CM | POA: Diagnosis not present

## 2017-06-07 DIAGNOSIS — C3490 Malignant neoplasm of unspecified part of unspecified bronchus or lung: Principal | ICD-10-CM

## 2017-06-08 DIAGNOSIS — C3490 Malignant neoplasm of unspecified part of unspecified bronchus or lung: Principal | ICD-10-CM

## 2017-06-08 MED ORDER — DOCUSATE SODIUM 100 MG CAPSULE
ORAL_CAPSULE | Freq: Every day | ORAL | 0 refills | 0.00000 days | Status: CP
Start: 2017-06-08 — End: 2018-01-06

## 2017-06-08 MED ORDER — GABAPENTIN 100 MG CAPSULE
ORAL_CAPSULE | Freq: Three times a day (TID) | ORAL | 0 refills | 0.00000 days | Status: CP
Start: 2017-06-08 — End: 2017-09-03

## 2017-06-08 MED ORDER — OXYCODONE 5 MG TABLET
ORAL_TABLET | ORAL | 0 refills | 0.00000 days | Status: CP | PRN
Start: 2017-06-08 — End: 2018-01-06

## 2017-06-08 MED ORDER — POLYETHYLENE GLYCOL 3350 17 GRAM ORAL POWDER PACKET
PACK | Freq: Every day | ORAL | 0 refills | 0.00000 days | Status: CP
Start: 2017-06-08 — End: 2018-01-06

## 2017-06-09 ENCOUNTER — Encounter: Payer: Self-pay | Admitting: Internal Medicine

## 2017-06-14 IMAGING — DX DG CHEST 2V
2 series · 2 of 2 positions shown · non-contrast
Comparison: None

CLINICAL DATA: Recent lung biopsy. Lung cancer. Shortness of
breath.

EXAM:
CHEST  2 VIEW

[chest pa]
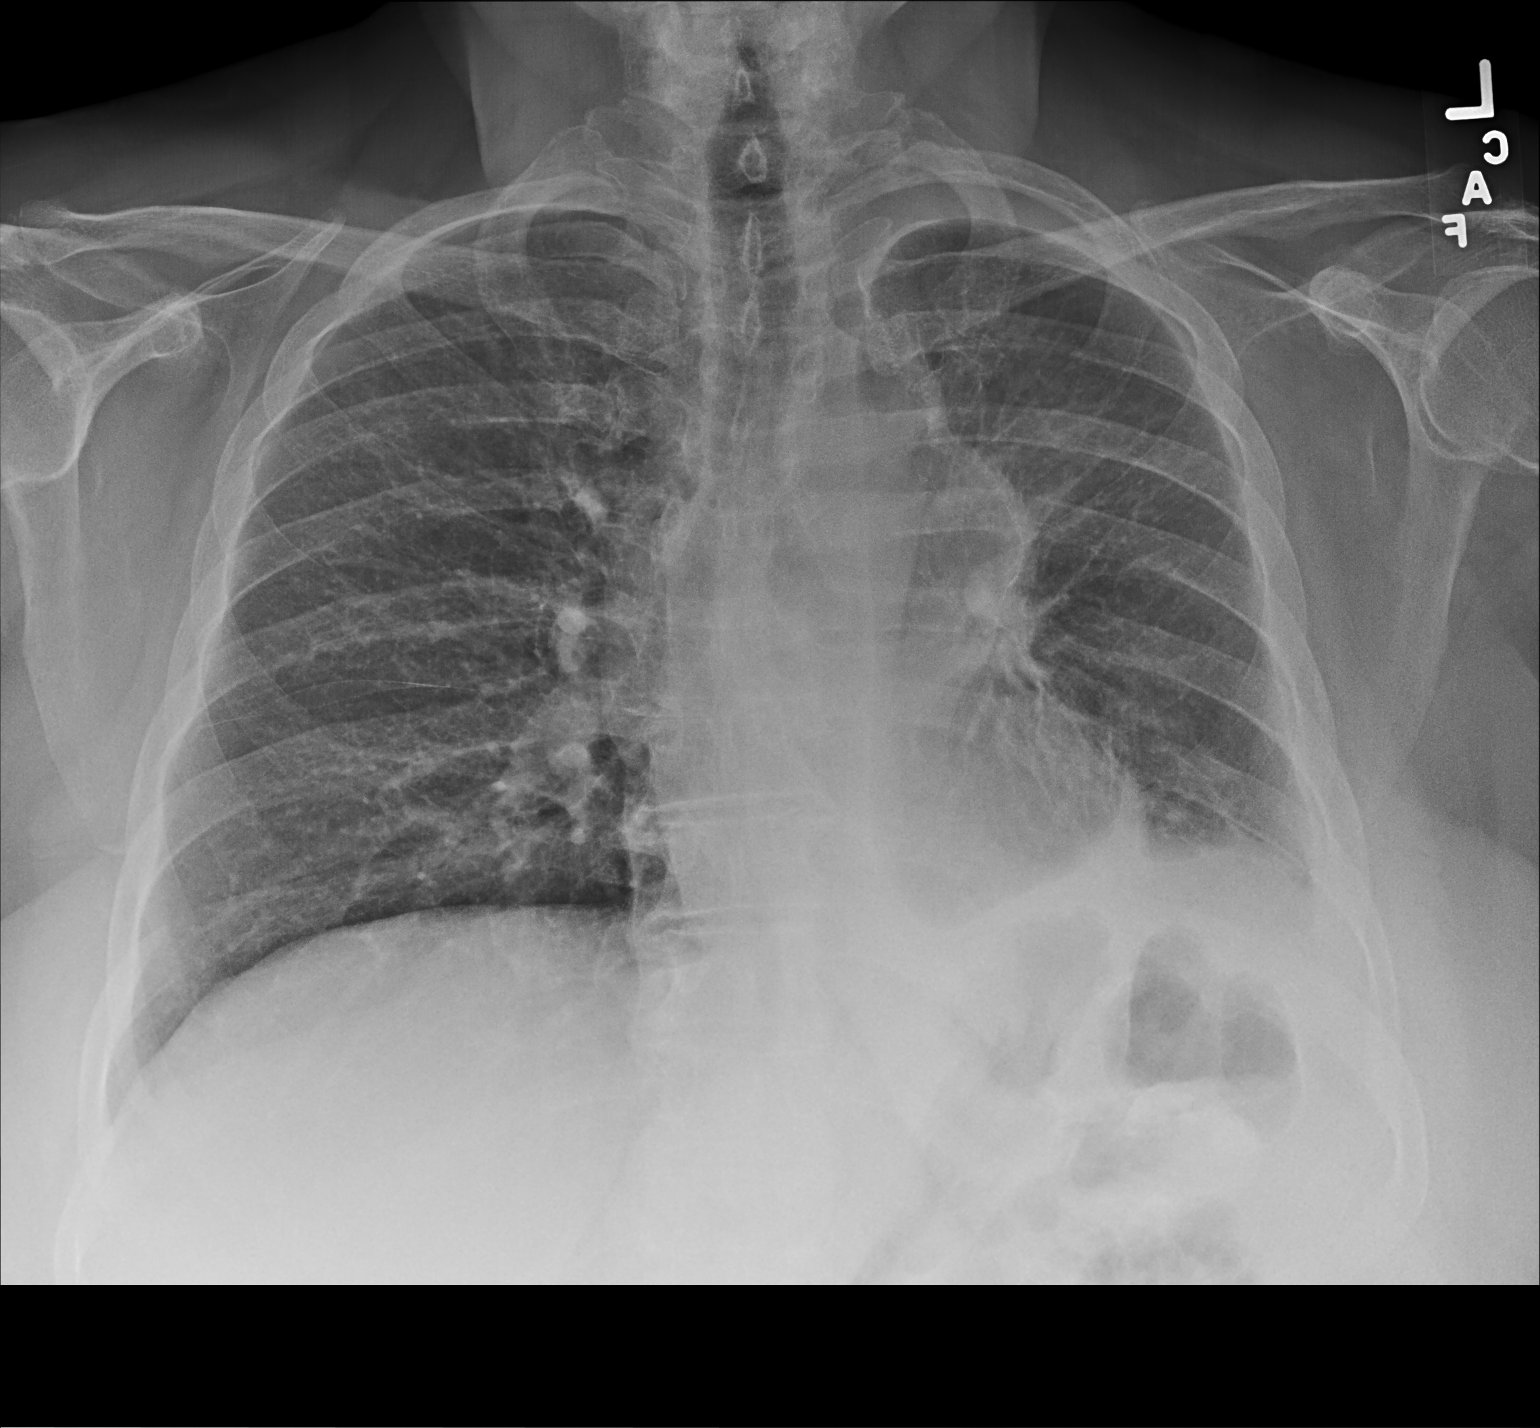

[chest lat]
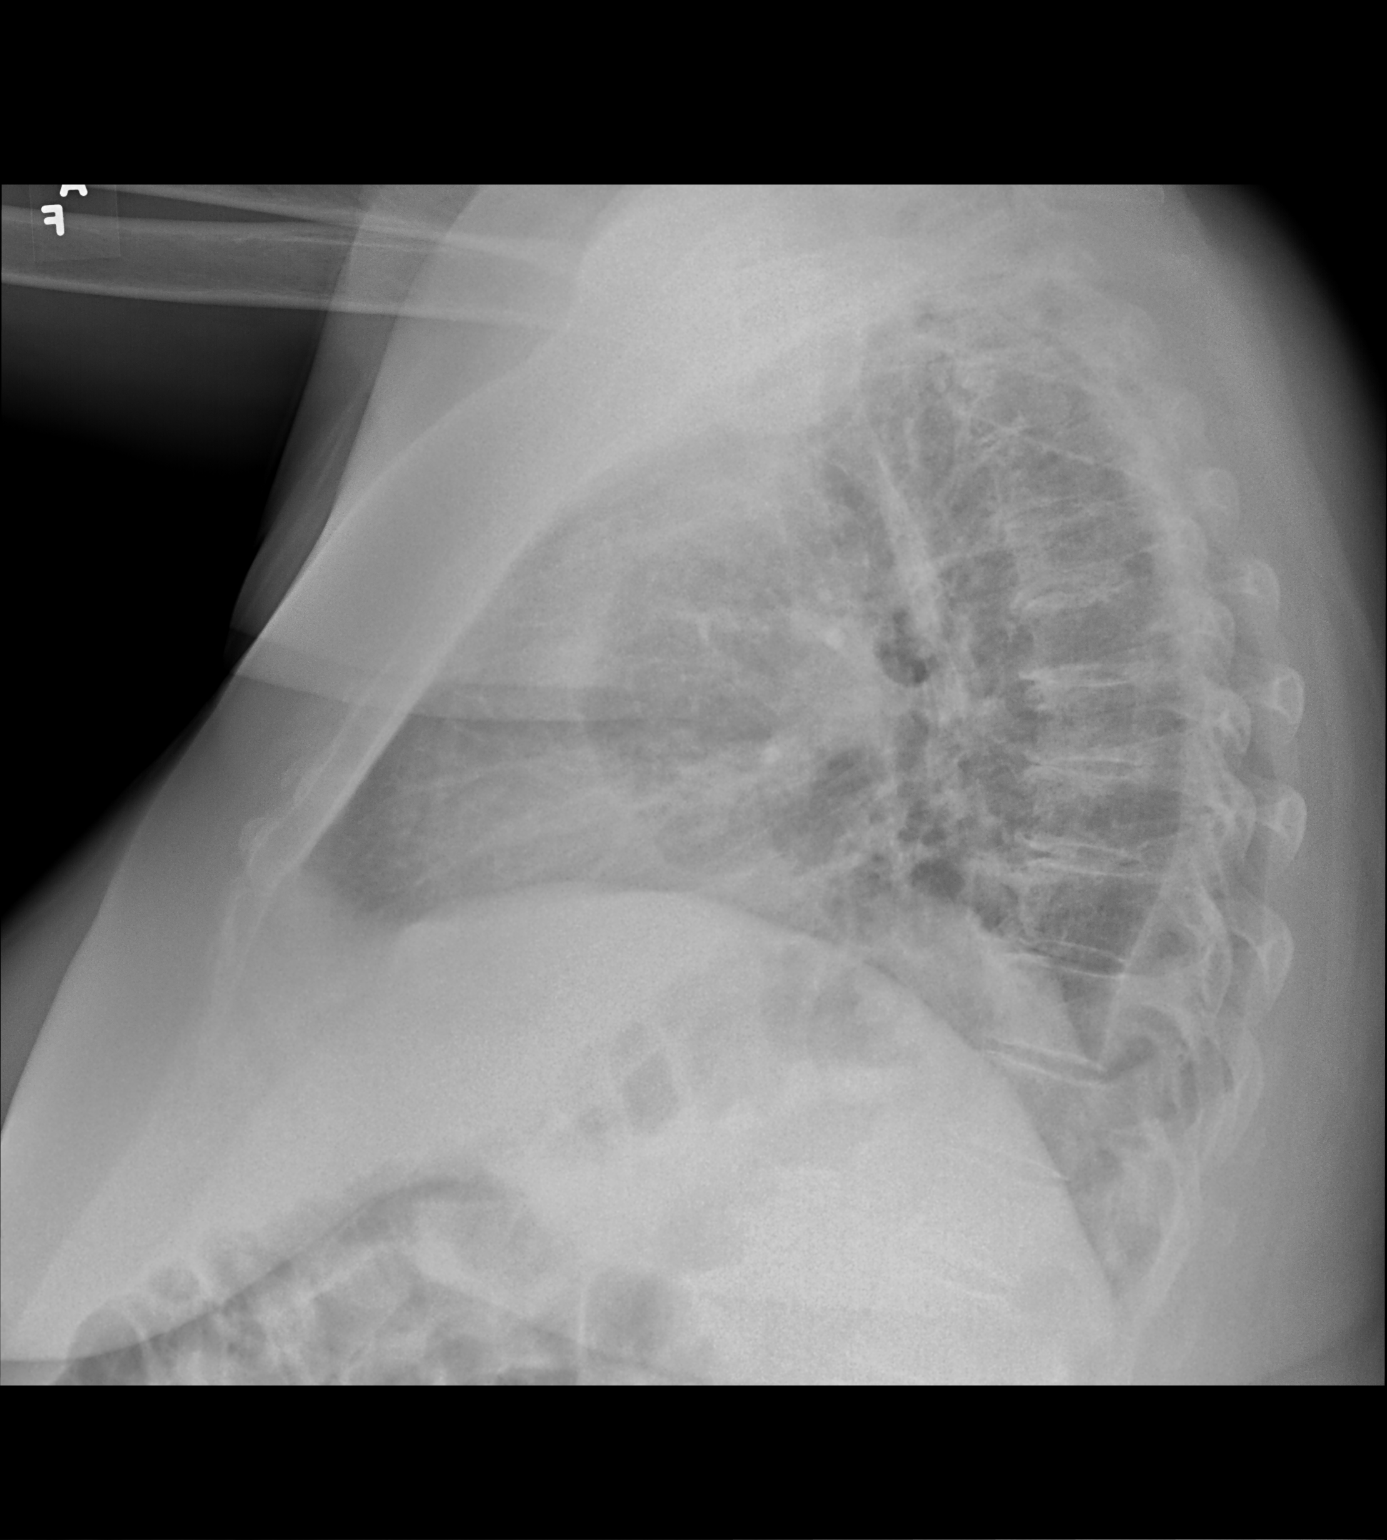

[2 of 2 positions shown; findings below may reference images not displayed]

FINDINGS: Right lung is clear. Possible left hilar fullness/mass. Left
perihilar and lower lobe airspace opacity. Small left pleural
effusion. Heart is normal size. No acute bony abnormality.
IMPRESSION: Possible left hilar fullness/ mass with left perihilar and lower
lobe atelectasis or infiltrate. Small left effusion.

## 2017-06-20 ENCOUNTER — Ambulatory Visit: Admission: RE | Admit: 2017-06-20 | Discharge: 2017-06-20 | Disposition: A | Discharge status: Home / Self Care

## 2017-06-20 ENCOUNTER — Ambulatory Visit
Admission: RE | Admit: 2017-06-20 | Discharge: 2017-06-20 | Disposition: A | Discharge status: Home / Self Care | Attending: Adult Health | Admitting: Adult Health

## 2017-06-20 DIAGNOSIS — J701 Chronic and other pulmonary manifestations due to radiation: Secondary | ICD-10-CM | POA: Diagnosis not present

## 2017-06-20 DIAGNOSIS — Z9889 Other specified postprocedural states: Secondary | ICD-10-CM | POA: Diagnosis not present

## 2017-06-20 DIAGNOSIS — C3490 Malignant neoplasm of unspecified part of unspecified bronchus or lung: Secondary | ICD-10-CM | POA: Diagnosis not present

## 2017-06-20 DIAGNOSIS — J984 Other disorders of lung: Secondary | ICD-10-CM | POA: Diagnosis not present

## 2017-06-20 DIAGNOSIS — Z87891 Personal history of nicotine dependence: Secondary | ICD-10-CM | POA: Diagnosis not present

## 2017-06-20 DIAGNOSIS — Z9221 Personal history of antineoplastic chemotherapy: Secondary | ICD-10-CM | POA: Diagnosis not present

## 2017-06-20 DIAGNOSIS — J90 Pleural effusion, not elsewhere classified: Secondary | ICD-10-CM | POA: Diagnosis not present

## 2017-06-20 DIAGNOSIS — C3492 Malignant neoplasm of unspecified part of left bronchus or lung: Secondary | ICD-10-CM | POA: Diagnosis not present

## 2017-06-20 DIAGNOSIS — Z923 Personal history of irradiation: Secondary | ICD-10-CM | POA: Diagnosis not present

## 2017-07-01 ENCOUNTER — Other Ambulatory Visit: Payer: Self-pay | Admitting: Internal Medicine

## 2017-07-01 DIAGNOSIS — Z23 Encounter for immunization: Secondary | ICD-10-CM | POA: Diagnosis not present

## 2017-07-23 ENCOUNTER — Ambulatory Visit
Admission: RE | Admit: 2017-07-23 | Discharge: 2017-07-23 | Disposition: A | Discharge status: Home / Self Care | Payer: MEDICARE | Attending: Hematology & Oncology | Admitting: Hematology & Oncology

## 2017-07-23 DIAGNOSIS — C3490 Malignant neoplasm of unspecified part of unspecified bronchus or lung: Secondary | ICD-10-CM | POA: Diagnosis not present

## 2017-07-25 ENCOUNTER — Ambulatory Visit
Admission: RE | Admit: 2017-07-25 | Discharge: 2017-07-25 | Disposition: A | Discharge status: Home / Self Care | Payer: MEDICARE

## 2017-07-25 DIAGNOSIS — R942 Abnormal results of pulmonary function studies: Secondary | ICD-10-CM

## 2017-07-25 DIAGNOSIS — C3490 Malignant neoplasm of unspecified part of unspecified bronchus or lung: Principal | ICD-10-CM

## 2017-08-01 ENCOUNTER — Encounter: Payer: Self-pay | Admitting: Internal Medicine

## 2017-08-01 DIAGNOSIS — I10 Essential (primary) hypertension: Secondary | ICD-10-CM

## 2017-08-01 DIAGNOSIS — D649 Anemia, unspecified: Secondary | ICD-10-CM

## 2017-08-01 DIAGNOSIS — E039 Hypothyroidism, unspecified: Secondary | ICD-10-CM

## 2017-08-02 ENCOUNTER — Other Ambulatory Visit (INDEPENDENT_AMBULATORY_CARE_PROVIDER_SITE_OTHER): Payer: Medicare Other

## 2017-08-02 DIAGNOSIS — E039 Hypothyroidism, unspecified: Secondary | ICD-10-CM

## 2017-08-02 DIAGNOSIS — I10 Essential (primary) hypertension: Secondary | ICD-10-CM

## 2017-08-02 DIAGNOSIS — D649 Anemia, unspecified: Secondary | ICD-10-CM | POA: Diagnosis not present

## 2017-08-02 LAB — CBC WITH DIFFERENTIAL/PLATELET
BASOS PCT: 0.8 % (ref 0.0–3.0)
Basophils Absolute: 0 10*3/uL (ref 0.0–0.1)
EOS ABS: 0.2 10*3/uL (ref 0.0–0.7)
EOS PCT: 4.8 % (ref 0.0–5.0)
HCT: 37.2 % (ref 36.0–46.0)
HEMOGLOBIN: 12.8 g/dL (ref 12.0–15.0)
LYMPHS ABS: 0.9 10*3/uL (ref 0.7–4.0)
Lymphocytes Relative: 18 % (ref 12.0–46.0)
MCHC: 34.4 g/dL (ref 30.0–36.0)
MCV: 90.2 fl (ref 78.0–100.0)
MONO ABS: 0.6 10*3/uL (ref 0.1–1.0)
Monocytes Relative: 12.5 % — ABNORMAL HIGH (ref 3.0–12.0)
NEUTROS ABS: 3.3 10*3/uL (ref 1.4–7.7)
Neutrophils Relative %: 63.9 % (ref 43.0–77.0)
PLATELETS: 301 10*3/uL (ref 150.0–400.0)
RBC: 4.12 Mil/uL (ref 3.87–5.11)
RDW: 13.1 % (ref 11.5–15.5)
WBC: 5.1 10*3/uL (ref 4.0–10.5)

## 2017-08-02 LAB — COMPREHENSIVE METABOLIC PANEL
ALBUMIN: 4.4 g/dL (ref 3.5–5.2)
ALT: 16 U/L (ref 0–35)
AST: 17 U/L (ref 0–37)
Alkaline Phosphatase: 81 U/L (ref 39–117)
BUN: 21 mg/dL (ref 6–23)
CHLORIDE: 103 meq/L (ref 96–112)
CO2: 31 meq/L (ref 19–32)
Calcium: 10.1 mg/dL (ref 8.4–10.5)
Creatinine, Ser: 0.99 mg/dL (ref 0.40–1.20)
GFR: 59.19 mL/min — ABNORMAL LOW (ref >60.00)
Glucose, Bld: 89 mg/dL (ref 70–99)
POTASSIUM: 4.3 meq/L (ref 3.5–5.1)
SODIUM: 139 meq/L (ref 135–145)
Total Bilirubin: 0.4 mg/dL (ref 0.2–1.2)
Total Protein: 7.2 g/dL (ref 6.0–8.3)

## 2017-08-02 LAB — TSH: TSH: 1.57 u[IU]/mL (ref 0.35–4.50)

## 2017-08-05 ENCOUNTER — Encounter: Payer: Self-pay | Admitting: Internal Medicine

## 2017-08-05 ENCOUNTER — Other Ambulatory Visit: Payer: Self-pay | Admitting: Internal Medicine

## 2017-08-05 ENCOUNTER — Ambulatory Visit (INDEPENDENT_AMBULATORY_CARE_PROVIDER_SITE_OTHER): Payer: Medicare Other | Admitting: Internal Medicine

## 2017-08-05 DIAGNOSIS — E039 Hypothyroidism, unspecified: Secondary | ICD-10-CM | POA: Diagnosis not present

## 2017-08-05 DIAGNOSIS — C3492 Malignant neoplasm of unspecified part of left bronchus or lung: Secondary | ICD-10-CM | POA: Diagnosis not present

## 2017-08-05 DIAGNOSIS — R05 Cough: Secondary | ICD-10-CM

## 2017-08-05 DIAGNOSIS — F329 Major depressive disorder, single episode, unspecified: Secondary | ICD-10-CM | POA: Diagnosis not present

## 2017-08-05 DIAGNOSIS — I10 Essential (primary) hypertension: Secondary | ICD-10-CM | POA: Diagnosis not present

## 2017-08-05 DIAGNOSIS — F419 Anxiety disorder, unspecified: Secondary | ICD-10-CM

## 2017-08-05 DIAGNOSIS — R059 Cough, unspecified: Secondary | ICD-10-CM

## 2017-08-05 MED ORDER — ZOSTER VAC RECOMB ADJUVANTED 50 MCG/0.5ML IM SUSR: 0.5000 mL | Freq: Once | INTRAMUSCULAR | 1 refills | Status: AC

## 2017-08-05 MED ORDER — BUPROPION HCL ER (SR) 100 MG PO TB12: 100.0000 mg | ORAL_TABLET | Freq: Two times a day (BID) | ORAL | 0 refills | Status: DC

## 2017-08-05 MED ORDER — BUDESONIDE 180 MCG/ACT IN AEPB: 2.0000 | INHALATION_SPRAY | Freq: Two times a day (BID) | RESPIRATORY_TRACT | 3 refills | Status: DC

## 2017-08-05 MED ORDER — HYDROCOD POLST-CPM POLST ER 10-8 MG/5ML PO SUER: 5.0000 mL | Freq: Every evening | ORAL | 0 refills | Status: DC | PRN

## 2017-08-05 MED ORDER — LEVOFLOXACIN 500 MG PO TABS: 500.0000 mg | ORAL_TABLET | Freq: Every day | ORAL | 0 refills | Status: DC

## 2017-08-05 NOTE — Progress Notes (Signed)
Subjective:  Patient ID: Stacy Santana, female    DOB: 1951/07/09  Age: 66 y.o. MRN: [id number]  CC: Diagnoses of Adenocarcinoma of left lung ([place]), Acquired hypothyroidism, Cough in adult, Anxiety and depression, and Essential hypertension were pertinent to this visit.  HPI Stacy Santana presents for follow up on multiple issues:  non small cell lung cancer: diagnosed and resected at [facility] in 2017,  Has had a local reoccurrence  Managed at [facility] with a wedge resection of the left lung nodule.  Despite the clean margins and standard of care approach , she is worried about not treating it with chemo  And has decided to start seeing a ==a naturopath to optimize her immune system    Stacy Santana to [place] to see family later this week   Cold symptoms started  Yesterday .  Monimally Productive cough,  Feels a hum on expiration .  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 azelastine (ASTELIN) 0.1 % nasal spray PLACE 1 SPRAY INTO BOTH NOSTRILS AT BEDTIME AS NEEDED 30 mL 6  . levothyroxine (SYNTHROID, LEVOTHROID) 50 MCG tablet Take 1 tablet (50 mcg total) by mouth daily. 90 tablet 0  . loratadine (CLARITIN) 10 MG tablet Take 10 mg by mouth daily.      [name] losartan (COZAAR) 100 MG tablet Take 1 tablet (100 mg total) by mouth daily. 90 tablet 3  . metoprolol succinate (TOPROL-XL) 50 MG 24 hr tablet TAKE 1 TABLET(50 MG) BY MOUTH DAILY 90 tablet 0  . Multiple Vitamin (MULTIVITAMIN) tablet Take 1 tablet by mouth daily.    . Naproxen Sodium 220 MG CAPS Take 2 capsules by mouth daily.      . Probiotic Product  (PROBIOTIC DAILY PO) Take by mouth.    . triamcinolone (NASACORT) 55 MCG/ACT AERO nasal inhaler Place into the nose.    [name] buPROPion (WELLBUTRIN XL) 300 MG 24 hr tablet TAKE 1 TABLET BY MOUTH EVERY DAY 90 tablet 0  . clonazePAM (KLONOPIN) 0.5 MG tablet Take 1 tablet (0.5 mg total) by mouth 2 (two) times daily as needed for anxiety. 60 tablet 1  . albuterol (PROVENTIL HFA;VENTOLIN HFA) 108 (90 Base) MCG/ACT inhaler Inhale into the lungs.    [name] amLODipine (NORVASC) 10 MG tablet Take 1 tablet (10 mg total) by mouth daily. (Patient not taking: Reported on 11/07/2016) 90 tablet 3  . amoxicillin-clavulanate (AUGMENTIN) 875-125 MG tablet Take 1 tablet by mouth 2 (two) times daily. (Patient not taking: Reported on 08/05/2017) 28 tablet 0  . Cholecalciferol (VITAMIN D PO) Take 1 tablet by mouth daily.      [name] gabapentin (NEURONTIN) 300 MG capsule Take 300 mg by mouth.    . Omega-3 Fatty Acids (FISH OIL PO) Take 1 capsule by mouth daily.      [name] oseltamivir (TAMIFLU) 75 MG capsule Take 1 capsule (75 mg total) by mouth daily. (Patient not taking: Reported on 08/05/2017) 10 capsule 0  . sodium chloride 0.9 % nebulizer solution     .  travoprost, benzalkonium, (TRAVATAN) 0.004 % ophthalmic solution Place 2 drops into both eyes at bedtime.     No facility-administered medications prior to visit.     Review of Systems;  Patient denies headache, fevers, malaise, unintentional weight loss, skin rash, eye pain, sinus congestion and sinus pain, sore throat, dysphagia,  hemoptysis , cough, dyspnea, wheezing, chest pain, palpitations, orthopnea, edema, abdominal pain, nausea, melena, diarrhea, constipation, flank pain, dysuria, hematuria, urinary  Frequency, nocturia, numbness, tingling, seizures,  Focal weakness, Loss of consciousness,  Tremor, insomnia, depression, anxiety, and suicidal ideation.      Objective:  BP (!) 132/58 (BP Location: Left Arm, Patient Position: Sitting, Cuff Size: Large)   Pulse 90   Temp 98.5  F (36.9 C) (Oral)   Resp 16   Ht 5\' 2"  (1.575 m)   Wt 179 lb 9.6 oz (81.5 kg)   SpO2 99%   BMI 32.85 kg/m   BP Readings from Last 3 Encounters:  08/05/17 (!) 132/58  11/07/16 140/84  08/27/16 (!) 145/85    Wt Readings from Last 3 Encounters:  08/05/17 179 lb 9.6 oz (81.5 kg)  11/07/16 176 lb (79.8 kg)  08/27/16 174 lb (78.9 kg)    General appearance: alert, cooperative and appears stated age Ears: normal TM's and external ear canals both ears Throat: lips, mucosa, and tongue normal; teeth and gums normal Neck: no adenopathy, no carotid bruit, supple, symmetrical, trachea midline and thyroid not enlarged, symmetric, no tenderness/mass/nodules Back: symmetric, no curvature. ROM normal. No CVA tenderness. Lungs: clear to auscultation bilaterally Heart: regular rate and rhythm, S1, S2 normal, no murmur, click, rub or gallop Abdomen: soft, non-tender; bowel sounds normal; no masses,  no organomegaly Pulses: 2+ and symmetric Skin: Skin color, texture, turgor normal. No rashes or lesions Lymph nodes: Cervical, supraclavicular, and axillary nodes normal.  Lab Results  Component Value Date   HGBA1C 5.0 11/05/2016    Lab Results  Component Value Date   CREATININE 0.99 08/02/2017   CREATININE 1.03 11/05/2016   CREATININE 0.99 09/26/2016    Lab Results  Component Value Date   WBC 5.1 08/02/2017   HGB 12.8 08/02/2017   HCT 37.2 08/02/2017   PLT 301.0 08/02/2017   GLUCOSE 89 08/02/2017   CHOL 179 11/05/2016   TRIG 190.0 (H) 11/05/2016   HDL 51.50 11/05/2016   LDLDIRECT 91.0 11/05/2016   LDLCALC 89 11/05/2016   ALT 16 08/02/2017   AST 17 08/02/2017   NA 139 08/02/2017   K 4.3 08/02/2017   CL 103 08/02/2017   CREATININE 0.99 08/02/2017   BUN 21 08/02/2017   CO2 31 08/02/2017   TSH 1.57 08/02/2017   HGBA1C 5.0 11/05/2016   MICROALBUR 1.2 11/05/2016    Dg Bone Density  Result Date: 01/08/2017 EXAM: DUAL X-RAY ABSORPTIOMETRY (DXA) FOR BONE MINERAL DENSITY  IMPRESSION: Dear Dr. Derrel Nip, Your patient Stacy Santana completed a BMD test on 01/08/2017 using the [place] (analysis version: 14.10) manufactured by [employer]. The following summarizes the results of our evaluation. PATIENT BIOGRAPHICAL: Name: Kameshia, Madruga Patient ID: [id number] Birth Date: December 11, 1950 Height: 62.0 in. Gender: Female Exam Date: 01/08/2017 Weight: 176.0 lbs. Indications: Caucasian, Family Hx of Osteoporosis, lung cancer, Postmenopausal, Previous Chemo and Radiation Fractures: Treatments: claritin, LEVOTHYROXINE, Vitamin D ASSESSMENT: The BMD measured at Femur Neck Left is 0.826 g/cm2 with a T-score of -1.5. This patient is considered osteopenic according to [facility]) criteria. Site Region Measured Measured WHO Young Adult BMD Date  Age      Classification T-score AP Spine L1-L4 01/08/[age] 67.9 Normal -0.1 1.177 g/cm2 AP Spine L1-L4 01/19/2013 64.0 Normal -0.1 1.180 g/cm2 AP Spine L1-L4 01/24/2010 61.0 Normal -0.3 1.158 g/cm2 AP Spine L1-L4 01/24/2010 61.0 Normal -0.3 1.158 g/cm2 DualFemur Neck Left 01/08/2017 67.9 Osteopenia -1.5 0.826 g/cm2 DualFemur Neck Left 01/19/2013 64.0 Osteopenia -1.2 0.870 g/cm2 DualFemur Neck Left 01/24/2010 61.0 Normal -1.0 0.904 g/cm2 DualFemur Neck Left 01/24/2010 61.0 Normal -1.0 0.904 g/cm2 World Health Organization [facility]) criteria for post-menopausal, Caucasian Women: Normal:       T-score at or above -1 SD Osteopenia:   T-score between -1 and -2.5 SD Osteoporosis: T-score at or below -2.5 SD RECOMMENDATIONS: [facility] recommends that FDA-approved medical therapies be considered in postmenopausal women and men age 106 or older with a: 1. Hip or vertebral (clinical or morphometric) fracture. 2. T-score of < -2.5 at the spine or hip. 3. Ten-year fracture probability by FRAX of 3% or greater for hip fracture or 20% or greater for major osteoporotic fracture. All treatment decisions require clinical  judgment and consideration of individual patient factors, including patient preferences, co-morbidities, previous drug use, risk factors not captured in the FRAX model (e.g. falls, vitamin D deficiency, increased bone turnover, interval significant decline in bone density) and possible under - or over-estimation of fracture risk by FRAX. All patients should ensure an adequate intake of dietary calcium (1200 mg/d) and vitamin D (800 IU daily) unless contraindicated. FOLLOW-UP: People with diagnosed cases of osteoporosis or at high risk for fracture should have regular bone mineral density tests. For patients eligible for Medicare, routine testing is allowed once every 2 years. The testing frequency can be increased to one year for patients who have rapidly progressing disease, those who are receiving or discontinuing medical therapy to restore bone mass, or have additional risk factors. I have reviewed this report, and agree with the above findings. [facility] Dear Dr. Derrel Nip, Your patient KEBRINA FRIEND completed a FRAX assessment on 01/08/2017 using the [place] (analysis version: 14.10) manufactured by [employer]. The following summarizes the results of our evaluation. PATIENT BIOGRAPHICAL: Name: Orelia, Brandstetter Patient ID: [id number] Birth Date: 1950-11-27 Height:    62.0 in. Gender:     [age]    [age]       Weight:    176.0 lbs. Ethnicity:  White                            Exam Date: 01/08/2017 FRAX* RESULTS:  (version: 3.5) 10-year Probability of Fracture1 Major Osteoporotic Fracture2 Hip Fracture 9.2% 1.1% Population: [place] (Caucasian) Risk Factors: None Based on Femur (Left) Neck BMD 1 -The 10-year probability of fracture may be lower than reported if the patient has received treatment. 2 -Major Osteoporotic Fracture: Clinical Spine, Forearm, Hip or Shoulder *FRAX is a Materials engineer of the [employer] for Metabolic Bone Disease, a [place] (WHO) [facility]. ASSESSMENT: The probability of a major osteoporotic fracture is 9.2% within the next ten years. The probability of a hip fracture is 1.1% within the next ten years. . Electronically Signed   By: Dorise Bullion III M.D   On: 01/08/2017 14:46    Assessment & Plan:   Problem List Items Addressed This Visit    Adenocarcinoma, lung ([place]) (Chronic)    Local recurrence discussed.  She is  anxious and apprehensive at the lack of available therapy other than resection but does not want to see a second opinion   Has been enrolled in clinical  Trial that links patients with specific genetic variations isolated from tumor to specific treatments .       Relevant Medications   levofloxacin (LEVAQUIN) 500 MG tablet   Anxiety and depression    2 week wean from  wellbutrin outlined per patient request       Cough in adult    Symptoms are suggstive of fibrosis .  Adding steroid inhaler and rx for levaquin and tussionex given       Hypertension (Chronic)    Well controlled on current regimen. Renal function stable, no changes today.      Hypothyroidism    Thyroid function is WNL on current dose.  No current changes needed.   Lab Results  Component Value Date   TSH 1.57 08/02/2017           .A total of 40 minutes was spent with patient more than half of which was spent in counseling patient on the above mentioned issues , reviewing and explaining recent labs and imaging studies done, and coordination of care. I have discontinued Lannette Donath. [place]'s Cholecalciferol (VITAMIN D PO), Omega-3 Fatty Acids (FISH OIL PO), travoprost (benzalkonium), gabapentin, sodium chloride, amLODipine, oseltamivir, amoxicillin-clavulanate, clonazePAM, and buPROPion. I am also having her start on Zoster Vaccine Adjuvanted, budesonide, levofloxacin, and chlorpheniramine-HYDROcodone. Additionally, I am having her maintain her loratadine, Naproxen Sodium, multivitamin, Probiotic Product  (PROBIOTIC DAILY PO), albuterol, triamcinolone, levothyroxine, azelastine, losartan, and metoprolol succinate.  Meds ordered this encounter  Medications  . DISCONTD: buPROPion (WELLBUTRIN SR) 100 MG 12 hr tablet    Sig: Take 1 tablet (100 mg total) 2 (two) times daily by mouth.    Dispense:  60 tablet    Refill:  0  . Zoster Vaccine Adjuvanted [facility]) injection    Sig: Inject 0.5 mLs once for 1 dose into the muscle.    Dispense:  1 each    Refill:  1  . budesonide (PULMICORT FLEXHALER) 180 MCG/ACT inhaler    Sig: Inhale 2 puffs 2 (two) times daily into the lungs.    Dispense:  1 Inhaler    Refill:  3  . levofloxacin (LEVAQUIN) 500 MG tablet    Sig: Take 1 tablet (500 mg total) daily by mouth.    Dispense:  7 tablet    Refill:  0  . chlorpheniramine-HYDROcodone (TUSSIONEX PENNKINETIC ER) 10-8 MG/5ML SUER    Sig: Take 5 mLs at bedtime as needed by mouth for cough.    Dispense:  140 mL    Refill:  0    Medications Discontinued During This Encounter  Medication Reason  . amLODipine (NORVASC) 10 MG tablet Patient has not taken in last 30 days  . amoxicillin-clavulanate (AUGMENTIN) 875-125 MG tablet Completed Course  . Cholecalciferol (VITAMIN D PO) Patient has not taken in last 30 days  . gabapentin (NEURONTIN) 300 MG capsule Patient has not taken in last 30 days  . Omega-3 Fatty Acids (FISH OIL PO) Patient has not taken in last 30 days  . oseltamivir (TAMIFLU) 75 MG capsule Patient has not taken in last 30 days  . sodium chloride 0.9 % nebulizer solution Patient has not taken in last 30 days  . travoprost, benzalkonium, (TRAVATAN) 0.004 % ophthalmic solution Patient has not taken in last 30 days  . clonazePAM (KLONOPIN) 0.5 MG tablet   .  buPROPion (WELLBUTRIN XL) 300 MG 24 hr tablet     Follow-up: No Follow-up on file.   Crecencio Mc, MD

## 2017-08-05 NOTE — Patient Instructions (Addendum)
Trial of Pulmicort (steroid inhaler) twice daily  for your cough  Take the Levaquin with you on your trip, just in case you start having green sputum or fevers    Tussionex for cough suppression

## 2017-08-06 DIAGNOSIS — R059 Cough, unspecified: Secondary | ICD-10-CM | POA: Insufficient documentation

## 2017-08-06 DIAGNOSIS — R05 Cough: Secondary | ICD-10-CM | POA: Insufficient documentation

## 2017-08-06 NOTE — Assessment & Plan Note (Signed)
Local recurrence discussed.  She is anxious and apprehensive at the lack of available therapy other than resection but does not want to see a second opinion   Has been enrolled in clinical  Trial that links patients with specific genetic variations isolated from tumor to specific treatments .

## 2017-08-06 NOTE — Assessment & Plan Note (Signed)
Thyroid function is WNL on current dose.  No current changes needed.   Lab Results  Component Value Date   TSH 1.57 08/02/2017

## 2017-08-06 NOTE — Assessment & Plan Note (Signed)
Well controlled on current regimen. Renal function stable, no changes today. 

## 2017-08-06 NOTE — Assessment & Plan Note (Addendum)
Symptoms are suggstive of fibrosis .  Adding steroid inhaler and rx for levaquin and tussionex given

## 2017-08-06 NOTE — Assessment & Plan Note (Signed)
2 week wean from  wellbutrin outlined per patient request

## 2017-09-03 ENCOUNTER — Ambulatory Visit
Admission: RE | Admit: 2017-09-03 | Discharge: 2017-09-03 | Disposition: A | Discharge status: Home / Self Care | Payer: MEDICARE

## 2017-09-03 ENCOUNTER — Ambulatory Visit
Admission: RE | Admit: 2017-09-03 | Discharge: 2017-09-03 | Disposition: A | Discharge status: Home / Self Care | Payer: MEDICARE | Attending: Hematology & Oncology | Admitting: Hematology & Oncology

## 2017-09-03 DIAGNOSIS — C3490 Malignant neoplasm of unspecified part of unspecified bronchus or lung: Principal | ICD-10-CM

## 2017-09-03 DIAGNOSIS — K219 Gastro-esophageal reflux disease without esophagitis: Secondary | ICD-10-CM | POA: Diagnosis not present

## 2017-09-03 DIAGNOSIS — E039 Hypothyroidism, unspecified: Secondary | ICD-10-CM | POA: Diagnosis not present

## 2017-09-03 DIAGNOSIS — J701 Chronic and other pulmonary manifestations due to radiation: Secondary | ICD-10-CM | POA: Diagnosis not present

## 2017-09-03 DIAGNOSIS — I10 Essential (primary) hypertension: Secondary | ICD-10-CM | POA: Diagnosis not present

## 2017-09-03 DIAGNOSIS — Z9221 Personal history of antineoplastic chemotherapy: Secondary | ICD-10-CM | POA: Diagnosis not present

## 2017-09-03 DIAGNOSIS — Z902 Acquired absence of lung [part of]: Secondary | ICD-10-CM | POA: Diagnosis not present

## 2017-09-03 DIAGNOSIS — Z87891 Personal history of nicotine dependence: Secondary | ICD-10-CM | POA: Diagnosis not present

## 2017-09-03 DIAGNOSIS — R0600 Dyspnea, unspecified: Secondary | ICD-10-CM | POA: Diagnosis not present

## 2017-09-05 ENCOUNTER — Other Ambulatory Visit: Payer: Self-pay

## 2017-09-28 ENCOUNTER — Other Ambulatory Visit: Payer: Self-pay | Admitting: Internal Medicine

## 2017-10-24 ENCOUNTER — Encounter: Payer: Self-pay | Admitting: Internal Medicine

## 2017-10-24 ENCOUNTER — Ambulatory Visit: Admit: 2017-10-24 | Discharge: 2017-10-24 | Payer: MEDICARE

## 2017-10-24 DIAGNOSIS — D72810 Lymphocytopenia: Principal | ICD-10-CM

## 2017-10-24 DIAGNOSIS — C3490 Malignant neoplasm of unspecified part of unspecified bronchus or lung: Secondary | ICD-10-CM

## 2017-10-24 DIAGNOSIS — R942 Abnormal results of pulmonary function studies: Secondary | ICD-10-CM | POA: Diagnosis not present

## 2017-10-30 IMAGING — DX DG CHEST 2V
2 series · 2 of 2 positions shown · non-contrast
Comparison: Chest x-ray of April 11, 2016

CLINICAL DATA: Cough, suspect acute bronchitis. History of lung
malignancy, hypertension, remote history of smoking.

EXAM:
CHEST  2 VIEW

[chest pa]
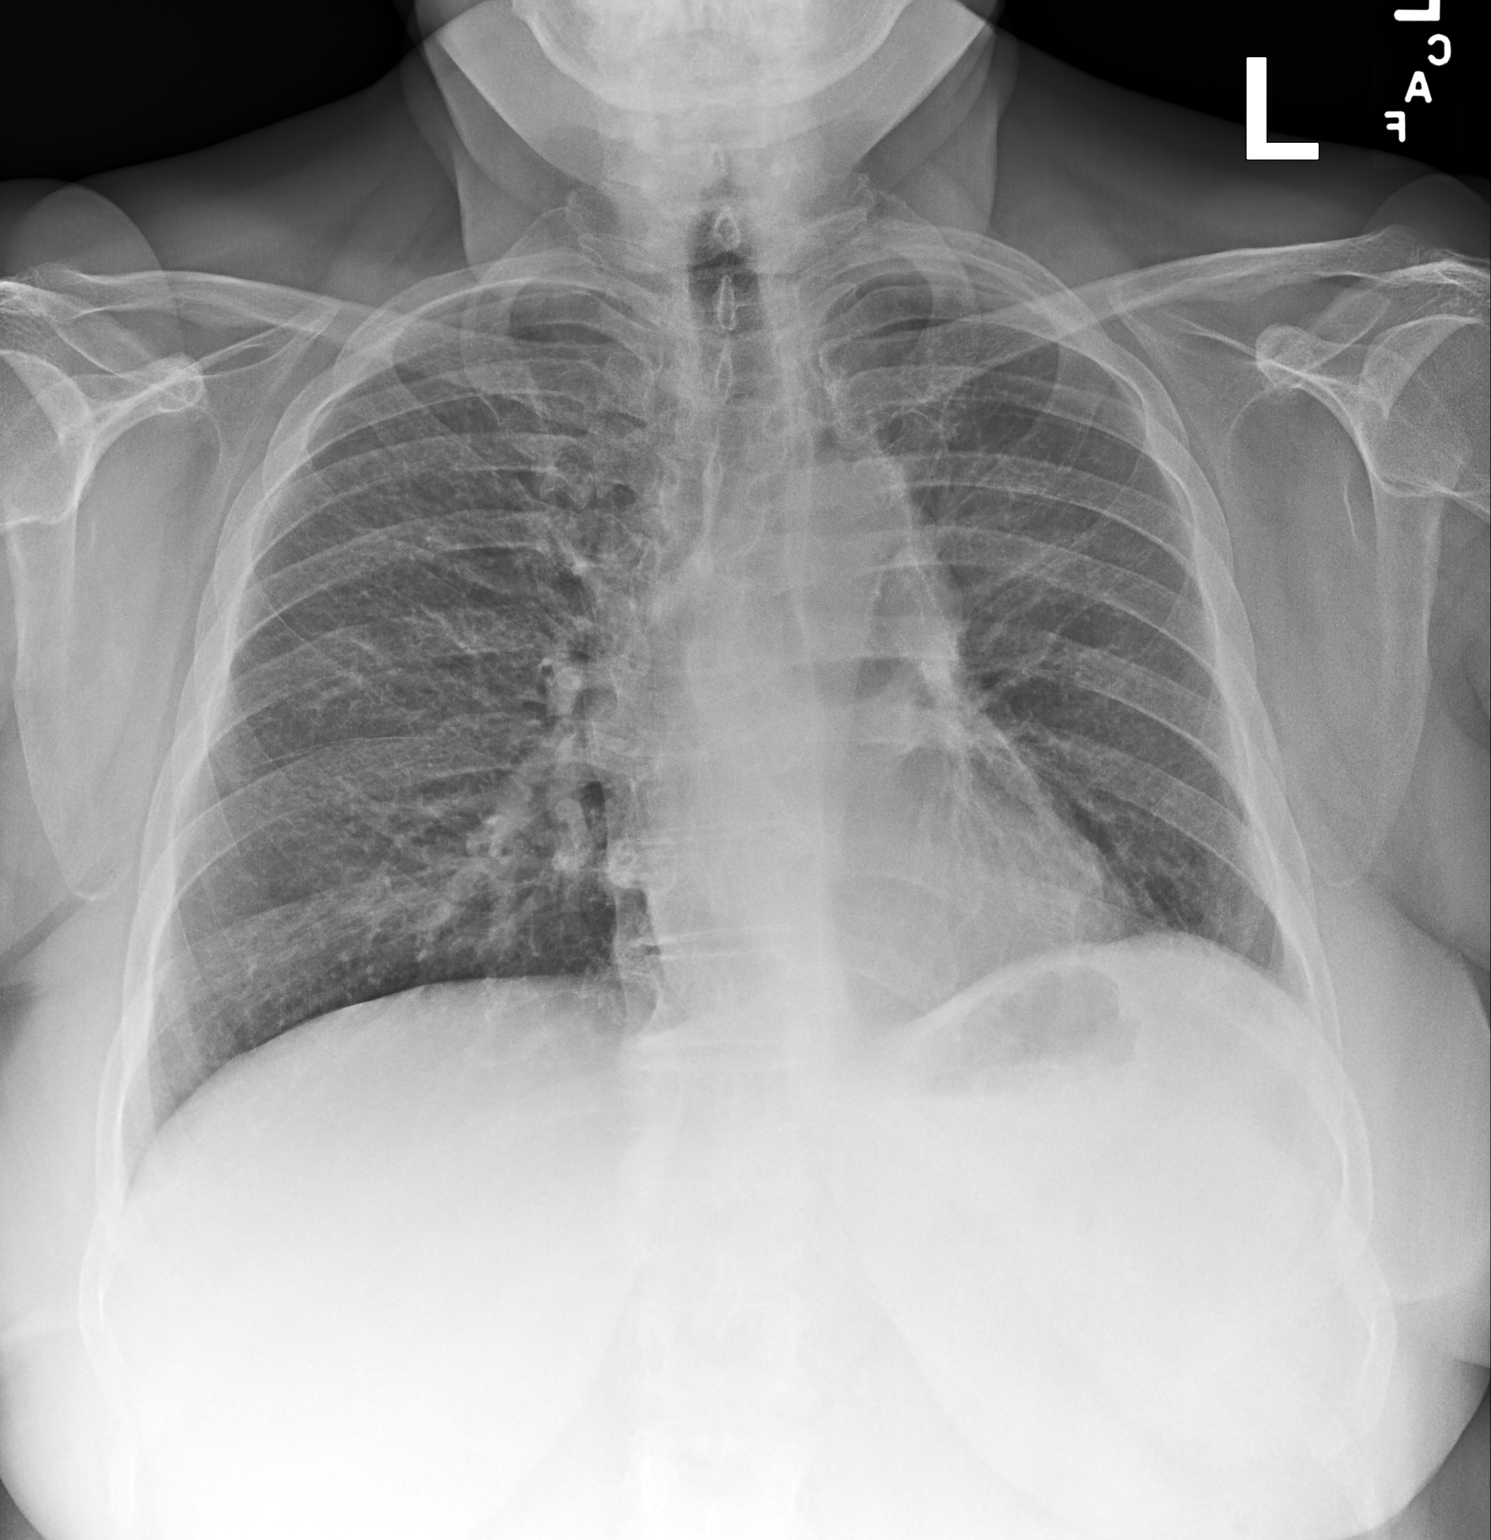

[chest lat]
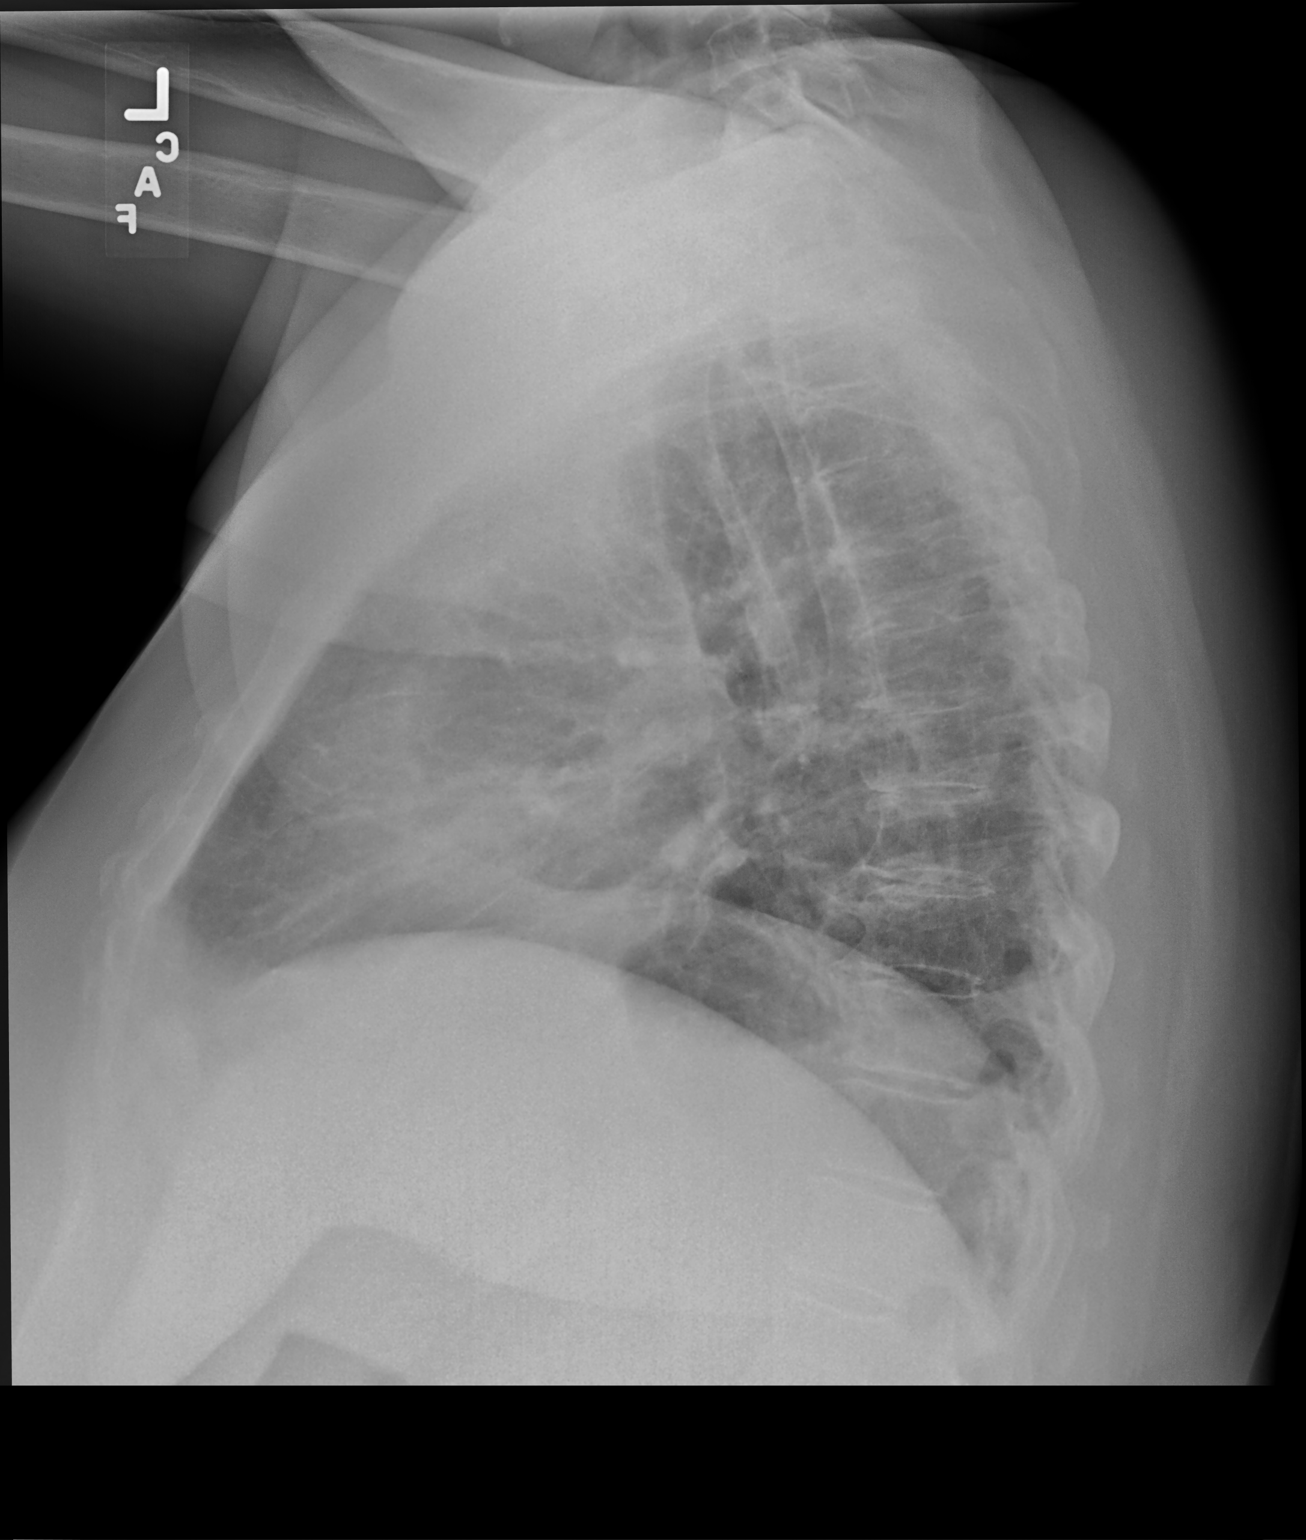

[2 of 2 positions shown; findings below may reference images not displayed]

FINDINGS: The lungs are reasonably well inflated. There is no focal
infiltrate. The interstitial markings are coarse though stable.
There is no pleural effusion or pneumothorax. The heart and
pulmonary vascularity are normal. The [HOSPITAL] line adjacent to
the left paratracheal region and aortic arch appears stable. There
is mild multilevel degenerative disc disease of the thoracic spine.
IMPRESSION: Chronic bronchitic changes. No evidence of pneumonia nor recurrent
malignancy. No CHF. If the patient's symptoms warrant further
evaluation, chest CT scanning would be the most useful next imaging
step.

## 2017-11-12 ENCOUNTER — Other Ambulatory Visit: Payer: Self-pay | Admitting: Internal Medicine

## 2017-12-03 ENCOUNTER — Ambulatory Visit: Admit: 2017-12-03 | Discharge: 2017-12-03 | Payer: MEDICARE

## 2017-12-03 ENCOUNTER — Ambulatory Visit: Admit: 2017-12-03 | Discharge: 2017-12-03 | Payer: MEDICARE | Attending: Adult Health | Primary: Adult Health

## 2017-12-03 DIAGNOSIS — R911 Solitary pulmonary nodule: Secondary | ICD-10-CM | POA: Diagnosis not present

## 2017-12-03 DIAGNOSIS — C3490 Malignant neoplasm of unspecified part of unspecified bronchus or lung: Secondary | ICD-10-CM | POA: Diagnosis not present

## 2017-12-03 DIAGNOSIS — J701 Chronic and other pulmonary manifestations due to radiation: Secondary | ICD-10-CM | POA: Diagnosis not present

## 2017-12-03 DIAGNOSIS — J8410 Pulmonary fibrosis, unspecified: Secondary | ICD-10-CM | POA: Diagnosis not present

## 2017-12-03 DIAGNOSIS — Z85118 Personal history of other malignant neoplasm of bronchus and lung: Secondary | ICD-10-CM | POA: Diagnosis not present

## 2017-12-03 DIAGNOSIS — C3492 Malignant neoplasm of unspecified part of left bronchus or lung: Secondary | ICD-10-CM | POA: Diagnosis not present

## 2017-12-06 ENCOUNTER — Other Ambulatory Visit (INDEPENDENT_AMBULATORY_CARE_PROVIDER_SITE_OTHER): Payer: Medicare Other

## 2017-12-06 ENCOUNTER — Ambulatory Visit (INDEPENDENT_AMBULATORY_CARE_PROVIDER_SITE_OTHER): Payer: Medicare Other | Admitting: Internal Medicine

## 2017-12-06 ENCOUNTER — Encounter: Payer: Self-pay | Admitting: Internal Medicine

## 2017-12-06 VITALS — BP 144/76 | HR 97 | Temp 98.1°F | Resp 16 | Ht 62.0 in | Wt 180.8 lb

## 2017-12-06 DIAGNOSIS — R059 Cough, unspecified: Secondary | ICD-10-CM

## 2017-12-06 DIAGNOSIS — Z1231 Encounter for screening mammogram for malignant neoplasm of breast: Secondary | ICD-10-CM | POA: Diagnosis not present

## 2017-12-06 DIAGNOSIS — I10 Essential (primary) hypertension: Secondary | ICD-10-CM | POA: Diagnosis not present

## 2017-12-06 DIAGNOSIS — R05 Cough: Secondary | ICD-10-CM

## 2017-12-06 DIAGNOSIS — R5383 Other fatigue: Secondary | ICD-10-CM | POA: Diagnosis not present

## 2017-12-06 DIAGNOSIS — E039 Hypothyroidism, unspecified: Secondary | ICD-10-CM

## 2017-12-06 DIAGNOSIS — C3492 Malignant neoplasm of unspecified part of left bronchus or lung: Secondary | ICD-10-CM | POA: Diagnosis not present

## 2017-12-06 DIAGNOSIS — Z1239 Encounter for other screening for malignant neoplasm of breast: Secondary | ICD-10-CM

## 2017-12-06 LAB — COMPREHENSIVE METABOLIC PANEL
ALBUMIN: 4.5 g/dL (ref 3.5–5.2)
ALK PHOS: 86 U/L (ref 39–117)
ALT: 18 U/L (ref 0–35)
AST: 18 U/L (ref 0–37)
BUN: 13 mg/dL (ref 6–23)
CHLORIDE: 104 meq/L (ref 96–112)
CO2: 26 mEq/L (ref 19–32)
Calcium: 10.5 mg/dL (ref 8.4–10.5)
Creatinine, Ser: 0.88 mg/dL (ref 0.40–1.20)
GFR: 67.74 mL/min (ref >60.00)
Glucose, Bld: 87 mg/dL (ref 70–99)
POTASSIUM: 3.8 meq/L (ref 3.5–5.1)
Sodium: 141 mEq/L (ref 135–145)
TOTAL PROTEIN: 7.4 g/dL (ref 6.0–8.3)
Total Bilirubin: 0.4 mg/dL (ref 0.2–1.2)

## 2017-12-06 LAB — VITAMIN B12: VITAMIN B 12: 323 pg/mL (ref 211–911)

## 2017-12-06 NOTE — Progress Notes (Signed)
Subjective:  Patient ID: Stacy Santana, female    DOB: 05-05-1951  Age: 67 y.o. MRN: [id number]  CC: The primary encounter diagnosis was Breast cancer screening. Diagnoses of Hypercalcemia, Fatigue, unspecified type, Cough in adult, Adenocarcinoma of lung, stage 1, left ([place]), Essential hypertension, and Acquired hypothyroidism were also pertinent to this visit.  HPI Stacy Santana presents for follow up on Lung Cancer .  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 Bronchoscopy mediated biopsy was nonspecific.   She  underwent LUL lobectomy, LLL wedge resection in July 2017  after serial CT showed interval increase. The tumor was 5 cm and path report showed a SM+ at the stapled vascular margin.   Surgery was followed by  Adjuvant chemoradiotherapy at [facility].    Local recurrence was noted in July 2018 and initially treated as non malignancy until repeat chest CT after an empiric trial of antibiotics confirmed a new lesion in the LUL .  Recurrence was treated with  Thoracotomy,  open intrapleural pneumonolysis  And therapeutic wedge resection on Sept 6 2018.  Second surgery achieved negative surgical margins and surveillance rather than adjuvant chemotherapy was recommended.    At 3 month follow up,  CT chest done in December 2018 : no new nodules. Unchanged left perihilar and suprahilar postradiation fibrosis.    March 2019 CT noted a new low attenuation focal nodularity inferior to the left hilum and posteriori to the left atrium.  Irregular contour of the anterior left 9th rib at the c/c junction was also noted,  Not mentioned previously, attributted to prior trauma.      She is understandably  alarmed  by the new changes given her history of recurrence and is not comfortable with the advised plan to follow up with another CT in 3 months.  After discussion today she has  decided to Make an appt with dr marks,  her radiation oncologist to review most recent CT scan .      History of depression with anxiety state:  Since last visit she has d/c wellburin,  Mood ok.   Some trouble sleeping, but only before her CT scans   No sinus infections ,  No UTis.  Some rhinitis.  No cough.    Hypertension:  Patient is taking her medications as prescribed and notes no adverse effects.  Home BP readings have been done about once per week and are  generally < 130/80 .  She is avoiding added salt in her diet and walking regularly about 3 times per week for exercise  .  Outpatient Medications Prior to Visit  Medication Sig Dispense Refill  . azelastine (ASTELIN) 0.1 % nasal spray PLACE 1 SPRAY INTO BOTH NOSTRILS AT BEDTIME AS NEEDED 30 mL 6  . Cholecalciferol (VITAMIN D) 400 UNIT/ML LIQD Take by mouth.    . levothyroxine (SYNTHROID, LEVOTHROID) 50 MCG tablet TAKE 1 TABLET(50 MCG) BY MOUTH DAILY 90 tablet 1  . loratadine (CLARITIN) 10 MG tablet Take 10 mg by mouth daily.      [name] losartan (COZAAR) 100 MG tablet Take 1 tablet (100 mg total) by mouth daily. 90 tablet 3  . metoprolol succinate (TOPROL-XL) 50 MG 24 hr tablet TAKE 1 TABLET(50 MG) BY MOUTH DAILY 90 tablet 1  . Multiple Vitamin (MULTIVITAMIN) tablet Take 1 tablet by mouth daily.    [name]  Probiotic Product (PROBIOTIC DAILY PO) Take by mouth.    . triamcinolone (NASACORT) 55 MCG/ACT AERO nasal inhaler Place into the nose.    . albuterol (PROVENTIL HFA;VENTOLIN HFA) 108 (90 Base) MCG/ACT inhaler Inhale into the lungs.    . budesonide (PULMICORT FLEXHALER) 180 MCG/ACT inhaler Inhale 2 puffs 2 (two) times daily into the lungs. (Patient not taking: Reported on 12/06/2017) 1 Inhaler 3  . buPROPion (WELLBUTRIN SR) 100 MG 12 hr tablet TAKE 1 TABLET(100 MG) BY MOUTH TWICE DAILY (Patient not taking: Reported on 12/06/2017) 180 tablet 0  . chlorpheniramine-HYDROcodone (TUSSIONEX PENNKINETIC ER) 10-8 MG/5ML SUER Take 5 mLs at bedtime as  needed by mouth for cough. (Patient not taking: Reported on 12/06/2017) 140 mL 0  . levofloxacin (LEVAQUIN) 500 MG tablet Take 1 tablet (500 mg total) daily by mouth. (Patient not taking: Reported on 12/06/2017) 7 tablet 0  . Naproxen Sodium 220 MG CAPS Take 2 capsules by mouth daily.       No facility-administered medications prior to visit.     Review of Systems;  Patient denies headache, fevers, malaise, unintentional weight loss, skin rash, eye pain, sinus congestion and sinus pain, sore throat, dysphagia,  hemoptysis , cough, dyspnea, wheezing, chest pain, palpitations, orthopnea, edema, abdominal pain, nausea, melena, diarrhea, constipation, flank pain, dysuria, hematuria, urinary  Frequency, nocturia, numbness, tingling, seizures,  Focal weakness, Loss of consciousness,  Tremor, insomnia, depression, anxiety, and suicidal ideation.      Objective:  BP (!) 144/76 (BP Location: Left Arm, Patient Position: Sitting, Cuff Size: Large)   Pulse 97   Temp 98.1 F (36.7 C) (Oral)   Resp 16   Ht 5\' 2"  (1.575 m)   Wt 180 lb 12.8 oz (82 kg)   SpO2 98%   BMI 33.07 kg/m   BP Readings from Last 3 Encounters:  12/06/17 (!) 144/76  08/05/17 (!) 132/58  11/07/16 140/84    Wt Readings from Last 3 Encounters:  12/06/17 180 lb 12.8 oz (82 kg)  08/05/17 179 lb 9.6 oz (81.5 kg)  11/07/16 176 lb (79.8 kg)    General appearance: alert, cooperative and appears stated age Ears: normal TM's and external ear canals both ears Throat: lips, mucosa, and tongue normal; teeth and gums normal Neck: no adenopathy, no carotid bruit, supple, symmetrical, trachea midline and thyroid not enlarged, symmetric, no tenderness/mass/nodules Back: symmetric, no curvature. ROM normal. No CVA tenderness. Lungs: clear to auscultation bilaterally Heart: regular rate and rhythm, S1, S2 normal, no murmur, click, rub or gallop Abdomen: soft, non-tender; bowel sounds normal; no masses,  no organomegaly Pulses: 2+ and  symmetric Skin: Skin color, texture, turgor normal. No rashes or lesions Lymph nodes: Cervical, supraclavicular, and axillary nodes normal.  Lab Results  Component Value Date   HGBA1C 5.0 11/05/2016    Lab Results  Component Value Date   CREATININE 0.88 12/06/2017   CREATININE 0.99 08/02/2017   CREATININE 1.03 11/05/2016    Lab Results  Component Value Date   WBC 5.1 08/02/2017   HGB 12.8 08/02/2017   HCT 37.2 08/02/2017   PLT 301.0 08/02/2017   GLUCOSE 87 12/06/2017   CHOL 179 11/05/2016   TRIG 190.0 (H) 11/05/2016   HDL 51.50 11/05/2016   LDLDIRECT 91.0 11/05/2016   LDLCALC 89 11/05/2016   ALT 18 12/06/2017   AST 18 12/06/2017   NA 141 12/06/2017   K 3.8 12/06/2017   CL 104 12/06/2017   CREATININE 0.88 12/06/2017   BUN 13 12/06/2017   CO2  26 12/06/2017   TSH 1.57 08/02/2017   HGBA1C 5.0 11/05/2016   MICROALBUR 1.2 11/05/2016      Assessment & Plan:   Problem List Items Addressed This Visit    Adenocarcinoma of lung, stage 1, left ([place])    Invasive mucinous adenoCA of LUL s/o LUL lobectomy and chemoradiation July 2017  At [facility].  Local recurrence  Treated in Sept 2018 with thoracotomy , therapeutic wedge resection, clean margins,  No chemo.  Most recent CT March 2018 concerning to patient for recurrence but being treated with conservative "follow up CT in 3 months" .  She is anxious and apprehensive about a non aggressive approach given her prior recurrence and considering getting a second opinion at [facility].  She has decided to meet with her radiation oncologist and if he is not willing to be more aggressive,  I have agreed to refer patient to [facility] for a second opinion . [facility] has enrolled her in the STRATA  enrolled in clinical  Trial that links patients with specific genetic variations isolated from tumor to specific treatments .       Cough in adult    Currently controlled with  Nasal steroid and antihistamine.  No longer using daily pulmicort.  Follow[facility] by [facility]  pulmonology,  PFTS  C/w restrictive lung disease secondary to thoracotomy and fibrosis.       Hypertension (Chronic)    Well controlled on current regimen. Renal function stable, no changes today.  Lab Results  Component Value Date   CREATININE 0.88 12/06/2017   Lab Results  Component Value Date   NA 141 12/06/2017   K 3.8 12/06/2017   CL 104 12/06/2017   CO2 26 12/06/2017         Hypothyroidism    Thyroid function is WNL on current dose.  No current changes needed.   Lab Results  Component Value Date   TSH 1.57 08/02/2017          Other Visit Diagnoses    Breast cancer screening    -  Primary   Relevant Orders   MM DIGITAL SCREENING BILATERAL   Hypercalcemia       Relevant Orders   PTH, Intact and Calcium   PTH-Related Peptide   Comprehensive metabolic panel   Fatigue, unspecified type       Relevant Orders   Vitamin B12 (Completed)     A total of 40 minutes was spent with patient more than half of which was spent in counseling patient on the above mentioned issues , reviewing and explaining recent labs and imaging studies done, and coordination of care.  I have discontinued Lannette Donath. Sheffler's Naproxen Sodium, budesonide, levofloxacin, chlorpheniramine-HYDROcodone, and buPROPion. I am also having her maintain her loratadine, multivitamin, Probiotic Product (PROBIOTIC DAILY PO), albuterol, triamcinolone, azelastine, losartan, metoprolol succinate, levothyroxine, and Vitamin D.  No orders of the defined types were placed in this encounter.   Medications Discontinued During This Encounter  Medication Reason  . budesonide (PULMICORT FLEXHALER) 180 MCG/ACT inhaler Patient has not taken in last 30 days  . buPROPion (WELLBUTRIN SR) 100 MG 12 hr tablet Patient has not taken in last 30 days  . chlorpheniramine-HYDROcodone (TUSSIONEX PENNKINETIC ER) 10-8 MG/5ML SUER Patient has not taken in last 30 days  . levofloxacin (LEVAQUIN) 500 MG tablet Completed Course  .  Naproxen Sodium 220 MG CAPS Patient has not taken in last 30 days    Follow-up: No Follow-up on file.   Crecencio Mc, MD

## 2017-12-08 NOTE — Assessment & Plan Note (Addendum)
Currently controlled with  Nasal steroid and antihistamine.  No longer using daily pulmicort.  Followed by [facility]monology,  PFTS  C/w restrictive lung disease secondary to thoracotomy and fibrosis.

## 2017-12-08 NOTE — Assessment & Plan Note (Signed)
Thyroid function is WNL on current dose.  No current changes needed.   Lab Results  Component Value Date   TSH 1.57 08/02/2017

## 2017-12-08 NOTE — Assessment & Plan Note (Signed)
Well controlled on current regimen. Renal function stable, no changes today.  Lab Results  Component Value Date   CREATININE 0.88 12/06/2017   Lab Results  Component Value Date   NA 141 12/06/2017   K 3.8 12/06/2017   CL 104 12/06/2017   CO2 26 12/06/2017

## 2017-12-08 NOTE — Assessment & Plan Note (Signed)
Invasive mucinous adenoCA of LUL s/o LUL lobectomy and chemoradiation July 2017  At [facility].  Local recurrence  Treated in Sept 2018 with thoracotomy , therapeutic wedge resection, clean margins,  No chemo.  Most recent CT March 2018 concerning to patient for recurrence but being treated with conservative "follow up CT in 3 months" .  She is anxious and apprehensive about a non aggressive approach given her prior recurrence and considering getting a second opinion at [facility].  She has decided to meet with her radiation oncologist and if he is not willing to be more aggressive,  I have agreed to refer patient to [facility] for a second opinion . [facility] has enrolled her in the STRATA  enrolled in clinical  Trial that links patients with specific genetic variations isolated from tumor to specific treatments .

## 2017-12-09 ENCOUNTER — Encounter: Payer: Self-pay | Admitting: Internal Medicine

## 2017-12-09 LAB — PTH, INTACT AND CALCIUM
CALCIUM: 10.1 mg/dL (ref 8.6–10.4)
PTH: 31 pg/mL (ref 14–64)

## 2017-12-10 ENCOUNTER — Ambulatory Visit
Admit: 2017-12-10 | Discharge: 2017-12-29 | Payer: MEDICARE | Attending: Radiation Oncology | Primary: Radiation Oncology

## 2017-12-10 DIAGNOSIS — R918 Other nonspecific abnormal finding of lung field: Secondary | ICD-10-CM

## 2017-12-10 DIAGNOSIS — C3490 Malignant neoplasm of unspecified part of unspecified bronchus or lung: Secondary | ICD-10-CM | POA: Diagnosis not present

## 2017-12-13 ENCOUNTER — Encounter: Payer: Self-pay | Admitting: Internal Medicine

## 2017-12-13 ENCOUNTER — Other Ambulatory Visit: Payer: Self-pay | Admitting: Internal Medicine

## 2017-12-13 DIAGNOSIS — N3000 Acute cystitis without hematuria: Secondary | ICD-10-CM

## 2017-12-13 DIAGNOSIS — N390 Urinary tract infection, site not specified: Secondary | ICD-10-CM | POA: Insufficient documentation

## 2017-12-13 LAB — PTH-RELATED PEPTIDE: PTH-Related Protein (PTH-RP): 11 pg/mL — ABNORMAL LOW (ref 14–27)

## 2017-12-13 MED ORDER — SULFAMETHOXAZOLE-TRIMETHOPRIM 800-160 MG PO TABS: 1.0000 | ORAL_TABLET | Freq: Two times a day (BID) | ORAL | 0 refills | Status: DC

## 2017-12-13 NOTE — Progress Notes (Signed)
Septra ds

## 2017-12-13 NOTE — Telephone Encounter (Signed)
Did not see message til after hours advised patient she could go to [place] walk in for UTI symptoms.

## 2017-12-15 ENCOUNTER — Encounter: Payer: Self-pay | Admitting: Internal Medicine

## 2017-12-23 ENCOUNTER — Ambulatory Visit: Admit: 2017-12-23 | Discharge: 2017-12-24 | Payer: MEDICARE

## 2017-12-23 DIAGNOSIS — C3490 Malignant neoplasm of unspecified part of unspecified bronchus or lung: Secondary | ICD-10-CM | POA: Diagnosis not present

## 2017-12-23 DIAGNOSIS — R918 Other nonspecific abnormal finding of lung field: Secondary | ICD-10-CM | POA: Diagnosis not present

## 2017-12-24 ENCOUNTER — Other Ambulatory Visit: Payer: Self-pay | Admitting: Internal Medicine

## 2017-12-27 ENCOUNTER — Encounter: Payer: Self-pay | Admitting: Internal Medicine

## 2018-01-02 ENCOUNTER — Ambulatory Visit
Admit: 2018-01-02 | Discharge: 2018-01-03 | Payer: MEDICARE | Attending: Critical Care Medicine | Primary: Critical Care Medicine

## 2018-01-02 DIAGNOSIS — C3492 Malignant neoplasm of unspecified part of left bronchus or lung: Secondary | ICD-10-CM | POA: Diagnosis not present

## 2018-01-02 DIAGNOSIS — R918 Other nonspecific abnormal finding of lung field: Secondary | ICD-10-CM | POA: Diagnosis not present

## 2018-01-05 DIAGNOSIS — R918 Other nonspecific abnormal finding of lung field: Principal | ICD-10-CM

## 2018-01-06 ENCOUNTER — Encounter: Admit: 2018-01-06 | Discharge: 2018-01-06 | Payer: MEDICARE | Attending: Anesthesiology | Primary: Anesthesiology

## 2018-01-06 ENCOUNTER — Ambulatory Visit: Admit: 2018-01-06 | Discharge: 2018-01-06 | Payer: MEDICARE

## 2018-01-06 DIAGNOSIS — R918 Other nonspecific abnormal finding of lung field: Principal | ICD-10-CM

## 2018-01-06 DIAGNOSIS — Z87891 Personal history of nicotine dependence: Secondary | ICD-10-CM | POA: Diagnosis not present

## 2018-01-06 DIAGNOSIS — Z8262 Family history of osteoporosis: Secondary | ICD-10-CM | POA: Diagnosis not present

## 2018-01-06 DIAGNOSIS — E039 Hypothyroidism, unspecified: Secondary | ICD-10-CM | POA: Diagnosis not present

## 2018-01-06 DIAGNOSIS — F329 Major depressive disorder, single episode, unspecified: Secondary | ICD-10-CM | POA: Diagnosis not present

## 2018-01-06 DIAGNOSIS — Z859 Personal history of malignant neoplasm, unspecified: Secondary | ICD-10-CM | POA: Diagnosis not present

## 2018-01-06 DIAGNOSIS — M1990 Unspecified osteoarthritis, unspecified site: Secondary | ICD-10-CM | POA: Diagnosis not present

## 2018-01-06 DIAGNOSIS — Z923 Personal history of irradiation: Secondary | ICD-10-CM | POA: Diagnosis not present

## 2018-01-06 DIAGNOSIS — R599 Enlarged lymph nodes, unspecified: Secondary | ICD-10-CM | POA: Diagnosis not present

## 2018-01-06 DIAGNOSIS — I10 Essential (primary) hypertension: Secondary | ICD-10-CM | POA: Diagnosis not present

## 2018-01-06 DIAGNOSIS — D491 Neoplasm of unspecified behavior of respiratory system: Secondary | ICD-10-CM | POA: Diagnosis not present

## 2018-01-16 ENCOUNTER — Telehealth: Payer: Self-pay | Admitting: Internal Medicine

## 2018-01-17 ENCOUNTER — Encounter: Payer: Self-pay | Admitting: Internal Medicine

## 2018-01-20 ENCOUNTER — Ambulatory Visit: Admit: 2018-01-20 | Discharge: 2018-01-21 | Payer: MEDICARE

## 2018-01-20 DIAGNOSIS — C3492 Malignant neoplasm of unspecified part of left bronchus or lung: Principal | ICD-10-CM

## 2018-01-28 ENCOUNTER — Ambulatory Visit: Admit: 2018-01-28 | Discharge: 2018-02-10 | Payer: MEDICARE

## 2018-01-28 ENCOUNTER — Ambulatory Visit: Admit: 2018-01-28 | Discharge: 2018-02-10 | Payer: MEDICARE | Attending: Surgery | Primary: Surgery

## 2018-01-28 DIAGNOSIS — R918 Other nonspecific abnormal finding of lung field: Secondary | ICD-10-CM

## 2018-01-28 DIAGNOSIS — C3490 Malignant neoplasm of unspecified part of unspecified bronchus or lung: Principal | ICD-10-CM

## 2018-01-28 DIAGNOSIS — I10 Essential (primary) hypertension: Secondary | ICD-10-CM | POA: Diagnosis not present

## 2018-01-28 DIAGNOSIS — C3432 Malignant neoplasm of lower lobe, left bronchus or lung: Secondary | ICD-10-CM | POA: Diagnosis not present

## 2018-01-28 DIAGNOSIS — Z85118 Personal history of other malignant neoplasm of bronchus and lung: Secondary | ICD-10-CM | POA: Diagnosis not present

## 2018-01-28 DIAGNOSIS — Z01818 Encounter for other preprocedural examination: Secondary | ICD-10-CM | POA: Diagnosis not present

## 2018-01-28 DIAGNOSIS — E039 Hypothyroidism, unspecified: Secondary | ICD-10-CM | POA: Diagnosis not present

## 2018-01-28 DIAGNOSIS — Z87891 Personal history of nicotine dependence: Secondary | ICD-10-CM | POA: Diagnosis not present

## 2018-01-28 DIAGNOSIS — M1990 Unspecified osteoarthritis, unspecified site: Secondary | ICD-10-CM | POA: Diagnosis not present

## 2018-01-29 ENCOUNTER — Encounter: Payer: Self-pay | Admitting: Internal Medicine

## 2018-01-31 ENCOUNTER — Ambulatory Visit: Admit: 2018-01-31 | Discharge: 2018-01-31 | Payer: MEDICARE

## 2018-01-31 DIAGNOSIS — Z9289 Personal history of other medical treatment: Secondary | ICD-10-CM

## 2018-01-31 DIAGNOSIS — Z1231 Encounter for screening mammogram for malignant neoplasm of breast: Secondary | ICD-10-CM | POA: Diagnosis not present

## 2018-01-31 LAB — HM MAMMOGRAPHY

## 2018-02-03 DIAGNOSIS — I10 Essential (primary) hypertension: Principal | ICD-10-CM

## 2018-02-03 DIAGNOSIS — Z419 Encounter for procedure for purposes other than remedying health state, unspecified: Principal | ICD-10-CM

## 2018-02-04 DIAGNOSIS — C3432 Malignant neoplasm of lower lobe, left bronchus or lung: Principal | ICD-10-CM

## 2018-02-06 ENCOUNTER — Ambulatory Visit
Admit: 2018-02-06 | Discharge: 2018-02-11 | Disposition: A | Discharge status: Home / Self Care | Payer: MEDICARE | Admitting: Surgery

## 2018-02-06 ENCOUNTER — Encounter
Admit: 2018-02-06 | Discharge: 2018-02-11 | Disposition: A | Discharge status: Home / Self Care | Payer: MEDICARE | Admitting: Surgery

## 2018-02-06 DIAGNOSIS — C3432 Malignant neoplasm of lower lobe, left bronchus or lung: Principal | ICD-10-CM

## 2018-02-06 DIAGNOSIS — D72828 Other elevated white blood cell count: Secondary | ICD-10-CM | POA: Diagnosis not present

## 2018-02-06 DIAGNOSIS — J9811 Atelectasis: Secondary | ICD-10-CM | POA: Diagnosis not present

## 2018-02-06 DIAGNOSIS — R0789 Other chest pain: Secondary | ICD-10-CM | POA: Diagnosis not present

## 2018-02-06 DIAGNOSIS — R9389 Abnormal findings on diagnostic imaging of other specified body structures: Secondary | ICD-10-CM | POA: Diagnosis not present

## 2018-02-06 DIAGNOSIS — G8918 Other acute postprocedural pain: Secondary | ICD-10-CM | POA: Diagnosis not present

## 2018-02-06 DIAGNOSIS — I959 Hypotension, unspecified: Secondary | ICD-10-CM | POA: Diagnosis not present

## 2018-02-06 DIAGNOSIS — Z4682 Encounter for fitting and adjustment of non-vascular catheter: Secondary | ICD-10-CM | POA: Diagnosis not present

## 2018-02-06 DIAGNOSIS — Z419 Encounter for procedure for purposes other than remedying health state, unspecified: Secondary | ICD-10-CM | POA: Diagnosis not present

## 2018-02-06 DIAGNOSIS — R0989 Other specified symptoms and signs involving the circulatory and respiratory systems: Secondary | ICD-10-CM | POA: Diagnosis not present

## 2018-02-06 DIAGNOSIS — J984 Other disorders of lung: Secondary | ICD-10-CM | POA: Diagnosis present

## 2018-02-06 DIAGNOSIS — E872 Acidosis: Secondary | ICD-10-CM | POA: Diagnosis not present

## 2018-02-06 DIAGNOSIS — R633 Feeding difficulties: Secondary | ICD-10-CM | POA: Diagnosis not present

## 2018-02-06 DIAGNOSIS — D62 Acute posthemorrhagic anemia: Secondary | ICD-10-CM | POA: Diagnosis not present

## 2018-02-06 DIAGNOSIS — K5730 Diverticulosis of large intestine without perforation or abscess without bleeding: Secondary | ICD-10-CM | POA: Diagnosis not present

## 2018-02-06 DIAGNOSIS — D72829 Elevated white blood cell count, unspecified: Secondary | ICD-10-CM | POA: Diagnosis not present

## 2018-02-06 DIAGNOSIS — Z8042 Family history of malignant neoplasm of prostate: Secondary | ICD-10-CM | POA: Diagnosis not present

## 2018-02-06 DIAGNOSIS — Z452 Encounter for adjustment and management of vascular access device: Secondary | ICD-10-CM | POA: Diagnosis not present

## 2018-02-06 DIAGNOSIS — I10 Essential (primary) hypertension: Secondary | ICD-10-CM | POA: Diagnosis present

## 2018-02-06 DIAGNOSIS — R079 Chest pain, unspecified: Secondary | ICD-10-CM | POA: Diagnosis not present

## 2018-02-06 DIAGNOSIS — J449 Chronic obstructive pulmonary disease, unspecified: Secondary | ICD-10-CM | POA: Diagnosis present

## 2018-02-06 DIAGNOSIS — R000 Tachycardia, unspecified: Secondary | ICD-10-CM | POA: Diagnosis not present

## 2018-02-06 DIAGNOSIS — F418 Other specified anxiety disorders: Secondary | ICD-10-CM | POA: Diagnosis present

## 2018-02-06 DIAGNOSIS — I310 Chronic adhesive pericarditis: Secondary | ICD-10-CM | POA: Diagnosis present

## 2018-02-06 DIAGNOSIS — Z87891 Personal history of nicotine dependence: Secondary | ICD-10-CM | POA: Diagnosis not present

## 2018-02-06 DIAGNOSIS — K660 Peritoneal adhesions (postprocedural) (postinfection): Secondary | ICD-10-CM | POA: Diagnosis present

## 2018-02-06 DIAGNOSIS — H409 Unspecified glaucoma: Secondary | ICD-10-CM | POA: Diagnosis present

## 2018-02-06 DIAGNOSIS — R911 Solitary pulmonary nodule: Secondary | ICD-10-CM | POA: Diagnosis not present

## 2018-02-06 DIAGNOSIS — J90 Pleural effusion, not elsewhere classified: Secondary | ICD-10-CM | POA: Diagnosis not present

## 2018-02-06 DIAGNOSIS — M1990 Unspecified osteoarthritis, unspecified site: Secondary | ICD-10-CM | POA: Diagnosis present

## 2018-02-06 DIAGNOSIS — G8912 Acute post-thoracotomy pain: Secondary | ICD-10-CM | POA: Diagnosis not present

## 2018-02-06 DIAGNOSIS — Z902 Acquired absence of lung [part of]: Secondary | ICD-10-CM | POA: Diagnosis not present

## 2018-02-06 DIAGNOSIS — Z9889 Other specified postprocedural states: Secondary | ICD-10-CM | POA: Diagnosis not present

## 2018-02-06 DIAGNOSIS — E039 Hypothyroidism, unspecified: Secondary | ICD-10-CM | POA: Diagnosis present

## 2018-02-06 DIAGNOSIS — R0689 Other abnormalities of breathing: Secondary | ICD-10-CM | POA: Diagnosis not present

## 2018-02-06 DIAGNOSIS — K219 Gastro-esophageal reflux disease without esophagitis: Secondary | ICD-10-CM | POA: Diagnosis present

## 2018-02-06 DIAGNOSIS — R1313 Dysphagia, pharyngeal phase: Secondary | ICD-10-CM | POA: Diagnosis not present

## 2018-02-06 DIAGNOSIS — C3492 Malignant neoplasm of unspecified part of left bronchus or lung: Secondary | ICD-10-CM | POA: Diagnosis not present

## 2018-02-06 DIAGNOSIS — J383 Other diseases of vocal cords: Secondary | ICD-10-CM | POA: Diagnosis present

## 2018-02-06 DIAGNOSIS — Z923 Personal history of irradiation: Secondary | ICD-10-CM | POA: Diagnosis not present

## 2018-02-06 DIAGNOSIS — R918 Other nonspecific abnormal finding of lung field: Secondary | ICD-10-CM | POA: Diagnosis not present

## 2018-02-06 DIAGNOSIS — J309 Allergic rhinitis, unspecified: Secondary | ICD-10-CM | POA: Diagnosis present

## 2018-02-06 DIAGNOSIS — C3490 Malignant neoplasm of unspecified part of unspecified bronchus or lung: Secondary | ICD-10-CM | POA: Diagnosis not present

## 2018-02-06 DIAGNOSIS — Z9221 Personal history of antineoplastic chemotherapy: Secondary | ICD-10-CM | POA: Diagnosis not present

## 2018-02-07 DIAGNOSIS — C3432 Malignant neoplasm of lower lobe, left bronchus or lung: Principal | ICD-10-CM

## 2018-02-08 ENCOUNTER — Encounter: Payer: Self-pay | Admitting: Internal Medicine

## 2018-02-08 DIAGNOSIS — C3432 Malignant neoplasm of lower lobe, left bronchus or lung: Principal | ICD-10-CM

## 2018-02-09 DIAGNOSIS — C3432 Malignant neoplasm of lower lobe, left bronchus or lung: Principal | ICD-10-CM

## 2018-02-10 DIAGNOSIS — C3432 Malignant neoplasm of lower lobe, left bronchus or lung: Principal | ICD-10-CM

## 2018-02-11 DIAGNOSIS — C3432 Malignant neoplasm of lower lobe, left bronchus or lung: Principal | ICD-10-CM

## 2018-02-11 MED ORDER — DOCUSATE SODIUM 100 MG PO CAPS
100.00 | ORAL_CAPSULE | ORAL | Status: DC
Start: 2018-02-12 — End: 2018-02-11

## 2018-02-11 MED ORDER — NALBUPHINE HCL 10 MG/ML IJ SOLN
2.50 | INTRAMUSCULAR | Status: DC
Start: ? — End: 2018-02-11

## 2018-02-11 MED ORDER — HEPARIN SODIUM (PORCINE) 5000 UNIT/ML IJ SOLN
5000.00 | INTRAMUSCULAR | Status: DC
Start: 2018-02-11 — End: 2018-02-11

## 2018-02-11 MED ORDER — LEVOTHYROXINE SODIUM 50 MCG PO TABS
50.00 | ORAL_TABLET | ORAL | Status: DC
Start: 2018-02-12 — End: 2018-02-11

## 2018-02-11 MED ORDER — CHOLECALCIFEROL 25 MCG (1000 UT) PO TABS
1000.00 | ORAL_TABLET | ORAL | Status: DC
Start: 2018-02-12 — End: 2018-02-11

## 2018-02-11 MED ORDER — METOPROLOL TARTRATE 25 MG PO TABS
25.00 | ORAL_TABLET | ORAL | Status: DC
Start: 2018-02-11 — End: 2018-02-11

## 2018-02-11 MED ORDER — ACETAMINOPHEN 325 MG PO TABS
650.00 | ORAL_TABLET | ORAL | Status: DC
Start: ? — End: 2018-02-11

## 2018-02-11 MED ORDER — IPRATROPIUM BROMIDE 0.02 % IN SOLN
500.00 | RESPIRATORY_TRACT | Status: DC
Start: ? — End: 2018-02-11

## 2018-02-11 MED ORDER — OXYCODONE HCL 5 MG PO TABS
5.00 | ORAL_TABLET | ORAL | Status: DC
Start: ? — End: 2018-02-11

## 2018-02-11 MED ORDER — METOCLOPRAMIDE HCL 5 MG/ML IJ SOLN
10.00 | INTRAMUSCULAR | Status: DC
Start: 2018-02-11 — End: 2018-02-11

## 2018-02-11 MED ORDER — POLYETHYLENE GLYCOL 3350 17 G PO PACK
17.00 | PACK | ORAL | Status: DC
Start: 2018-02-12 — End: 2018-02-11

## 2018-02-11 MED ORDER — DOCUSATE SODIUM 100 MG CAPSULE
ORAL_CAPSULE | ORAL | 0 refills | 0 days
Start: 2018-02-11 — End: 2018-02-11

## 2018-02-11 MED ORDER — OXYCODONE 5 MG TABLET
ORAL_TABLET | ORAL | 0 refills | 0.00000 days | Status: CP | PRN
Start: 2018-02-11 — End: 2018-05-01

## 2018-02-11 MED ORDER — METOCLOPRAMIDE 10 MG TABLET
Freq: Three times a day (TID) | ORAL | 0 refills | 0.00000 days | Status: CP
Start: 2018-02-11 — End: 2018-06-19

## 2018-02-11 MED ORDER — ACETAMINOPHEN 325 MG TABLET
ORAL | 0 refills | 0.00000 days | PRN
Start: 2018-02-11 — End: ?

## 2018-02-11 MED ORDER — METOCLOPRAMIDE 10 MG TABLET: 10 mg | tablet | Freq: Three times a day (TID) | 0 refills | 0 days | Status: AC

## 2018-02-11 MED FILL — DOCUSATE/100MG/CAPS: DOCUSATE/100MG/CAPS | 30 days supply | Qty: 30 | Fill #0

## 2018-02-11 MED FILL — OXYCODONE HCL/5MG/TABS: OXYCODONE HCL/5MG/TABS | 3 days supply | Qty: 20 | Fill #0

## 2018-02-11 MED FILL — METOCLOPRAMIDE HCL/10MG/TABS: METOCLOPRAMIDE HCL/10MG/TABS | 30 days supply | Qty: 90 | Fill #0

## 2018-02-12 MED ORDER — DOCUSATE SODIUM 100 MG CAPSULE
ORAL_CAPSULE | Freq: Every day | ORAL | 0 refills | 0 days | Status: CP
Start: 2018-02-12 — End: 2018-03-04

## 2018-02-12 MED ORDER — POLYETHYLENE GLYCOL 3350 17 GRAM ORAL POWDER PACKET
PACK | Freq: Every day | ORAL | 0 refills | 0 days
Start: 2018-02-12 — End: 2018-03-04

## 2018-02-19 MED ORDER — GABAPENTIN 300 MG CAPSULE
ORAL_CAPSULE | Freq: Three times a day (TID) | ORAL | 2 refills | 0 days | Status: CP
Start: 2018-02-19 — End: 2018-09-18

## 2018-02-20 ENCOUNTER — Other Ambulatory Visit: Payer: Self-pay

## 2018-02-20 ENCOUNTER — Ambulatory Visit (INDEPENDENT_AMBULATORY_CARE_PROVIDER_SITE_OTHER): Payer: Medicare Other | Admitting: Internal Medicine

## 2018-02-20 ENCOUNTER — Encounter: Payer: Self-pay | Admitting: Internal Medicine

## 2018-02-20 VITALS — BP 136/78 | HR 100 | Temp 98.1°F | Wt 179.6 lb

## 2018-02-20 DIAGNOSIS — D649 Anemia, unspecified: Secondary | ICD-10-CM

## 2018-02-20 DIAGNOSIS — C3492 Malignant neoplasm of unspecified part of left bronchus or lung: Secondary | ICD-10-CM

## 2018-02-20 DIAGNOSIS — Z1159 Encounter for screening for other viral diseases: Secondary | ICD-10-CM | POA: Diagnosis not present

## 2018-02-20 DIAGNOSIS — E669 Obesity, unspecified: Secondary | ICD-10-CM

## 2018-02-20 DIAGNOSIS — R300 Dysuria: Secondary | ICD-10-CM | POA: Diagnosis not present

## 2018-02-20 LAB — POCT URINALYSIS DIPSTICK
Bilirubin, UA: NEGATIVE
Glucose, UA: NEGATIVE
KETONES UA: NEGATIVE
Leukocytes, UA: NEGATIVE
NITRITE UA: NEGATIVE
PH UA: 5 (ref 5.0–8.0)
Protein, UA: NEGATIVE
RBC UA: NEGATIVE
SPEC GRAV UA: 1.02 (ref 1.010–1.025)
Urobilinogen, UA: 0.2 E.U./dL

## 2018-02-20 LAB — URINALYSIS, MICROSCOPIC ONLY: RBC / HPF: NONE SEEN (ref 0–?)

## 2018-02-20 MED ORDER — LIDOCAINE 5 % EX OINT: 1.0000 "application " | TOPICAL_OINTMENT | CUTANEOUS | 0 refills | Status: DC | PRN

## 2018-02-20 MED ORDER — MELOXICAM 15 MG PO TABS: 15.0000 mg | ORAL_TABLET | Freq: Every day | ORAL | 5 refills | Status: DC

## 2018-02-20 NOTE — Patient Instructions (Addendum)
For your pain :   Ok to use  either aleve twice daily or meloxicam once daily  Ok to add tylenol up to 2000 mg daily in divided doses,  With either  NSAID   Lidocaine ointment  If needed   Measles and iron /hgb next week

## 2018-02-20 NOTE — Progress Notes (Signed)
Subjective:  Patient ID: Stacy Santana, female    DOB: 08-29-1951  Age: 67 y.o. MRN: [id number]  CC: There were no encounter diagnoses.  HPI Stacy Santana presents for hospital follow up. Admitted to [facility] on May 9 Underwent left pneumonectomy  With intercostal muscle flap repair,  L sided chest tube insertion removed onPost op  Day 1  for recurrent lung CA. Returned to ICU on POD 3   due to  hypoxia and tachycarda.  PE ruled out  Sent home on  POD 5 , bowel function had returned ,  On 4 L 02 with activity 81% sats with walking perciardial sac was entered .  Since discharge  Home,   her 6 minute walk on the porch wothjout 02 94%   Resting stas 95%  Sleeping better on the surgery side,  Some lymph accu for recurrence of lung camulatio aroudn the surgical incision .   Started taking gabapentin 300  Tid yesterday for thoracotomy pain.  Some dizziness.   Left laryngeal nerve sacrificed.  Possibly phremic nerve too.  Left diaphgram is elevated and gasric bubble noted to be higher up   Using reglan for gastric mobility with meals   ECHO reviewed  Some frequency and urgency,   But voiding small amounts   Outpatient Medications Prior to Visit  Medication Sig Dispense Refill  . azelastine (ASTELIN) 0.1 % nasal spray PLACE 1 SPRAY INTO BOTH NOSTRILS AT BEDTIME AS NEEDED 30 mL 6  . Cholecalciferol (VITAMIN D) 400 UNIT/ML LIQD Take by mouth.    . gabapentin (NEURONTIN) 300 MG capsule Take 300 mg by mouth 3 (three) times daily.    [name] levothyroxine (SYNTHROID, LEVOTHROID) 50 MCG tablet TAKE 1 TABLET(50 MCG) BY MOUTH DAILY 90 tablet 1  . loratadine (CLARITIN) 10 MG tablet Take 10 mg by mouth daily.      [name] losartan (COZAAR) 100 MG tablet TAKE 1 TABLET(100 MG) BY MOUTH DAILY 90 tablet 1  . metoCLOPramide (REGLAN) 10 MG tablet Take 10 mg by mouth 3 (three) times daily.    . metoprolol succinate (TOPROL-XL) 50 MG 24 hr tablet TAKE 1 TABLET(50 MG) BY MOUTH DAILY 90 tablet 1  . Multiple Vitamin  (MULTIVITAMIN) tablet Take 1 tablet by mouth daily.    . Probiotic Product (PROBIOTIC DAILY PO) Take by mouth.    . triamcinolone (NASACORT) 55 MCG/ACT AERO nasal inhaler Place into the nose.    . albuterol (PROVENTIL HFA;VENTOLIN HFA) 108 (90 Base) MCG/ACT inhaler Inhale into the lungs.    . sulfamethoxazole-trimethoprim (BACTRIM DS,SEPTRA DS) 800-160 MG tablet Take 1 tablet by mouth 2 (two) times daily. (Patient not taking: Reported on 02/20/2018) 6 tablet 0   No facility-administered medications prior to visit.     Review of Systems;  Patient denies headache, fevers, malaise, unintentional weight loss, skin rash, eye pain, sinus congestion and sinus pain, sore throat, dysphagia,  hemoptysis , cough, dyspnea, wheezing, chest pain, palpitations, orthopnea, edema, abdominal pain, nausea, melena, diarrhea, constipation, flank pain, dysuria, hematuria, urinary  Frequency, nocturia, numbness, tingling, seizures,  Focal weakness, Loss of consciousness,  Tremor, insomnia, depression, anxiety, and suicidal ideation.      Objective:  BP 136/78 (BP Location: Left Arm, Patient Position: Sitting, Cuff Size: Normal)   Pulse 100   Temp 98.1 F (36.7 C)   Wt 179 lb 9.6 oz (81.5 kg)   SpO2 98%   BMI 32.85 kg/m   BP Readings from Last 3 Encounters:  02/20/18 136/78  12/06/17 (!) 144/76  08/05/17 (!) 132/58    Wt Readings from Last 3 Encounters:  02/20/18 179 lb 9.6 oz (81.5 kg)  12/06/17 180 lb 12.8 oz (82 kg)  08/05/17 179 lb 9.6 oz (81.5 kg)    General appearance: alert, cooperative and appears stated age Ears: normal TM's and external ear canals both ears Throat: lips, mucosa, and tongue normal; teeth and gums normal Neck: no adenopathy, no carotid bruit, supple, symmetrical, trachea midline and thyroid not enlarged, symmetric, no tenderness/mass/nodules Back: symmetric, no curvature. ROM normal. No CVA tenderness. Lungs: clear to auscultation bilaterally Heart: regular rate and rhythm,  S1, S2 normal, no murmur, click, rub or gallop Abdomen: soft, non-tender; bowel sounds normal; no masses,  no organomegaly Pulses: 2+ and symmetric Skin: Skin color, texture, turgor normal. No rashes or lesions Lymph nodes: Cervical, supraclavicular, and axillary nodes normal.  Lab Results  Component Value Date   HGBA1C 5.0 11/05/2016    Lab Results  Component Value Date   CREATININE 0.88 12/06/2017   CREATININE 0.99 08/02/2017   CREATININE 1.03 11/05/2016    Lab Results  Component Value Date   WBC 5.1 08/02/2017   HGB 12.8 08/02/2017   HCT 37.2 08/02/2017   PLT 301.0 08/02/2017   GLUCOSE 87 12/06/2017   CHOL 179 11/05/2016   TRIG 190.0 (H) 11/05/2016   HDL 51.50 11/05/2016   LDLDIRECT 91.0 11/05/2016   LDLCALC 89 11/05/2016   ALT 18 12/06/2017   AST 18 12/06/2017   NA 141 12/06/2017   K 3.8 12/06/2017   CL 104 12/06/2017   CREATININE 0.88 12/06/2017   BUN 13 12/06/2017   CO2 26 12/06/2017   TSH 1.57 08/02/2017   HGBA1C 5.0 11/05/2016   MICROALBUR 1.2 11/05/2016    Dg Bone Density  Result Date: 01/08/2017 EXAM: DUAL X-RAY ABSORPTIOMETRY (DXA) FOR BONE MINERAL DENSITY IMPRESSION: Dear Dr. Derrel Nip, Your patient Stacy Santana completed a BMD test on 01/08/2017 using the [place] (analysis version: 14.10) manufactured by [employer]. The following summarizes the results of our evaluation. PATIENT BIOGRAPHICAL: Name: [name], [name] Patient ID: [id number] Birth Date: Jan 11, 1951 Height: 62.0 in. Gender: Female Exam Date: 01/08/2017 Weight: 176.0 lbs. Indications: Caucasian, Family Hx of Osteoporosis, lung cancer, Postmenopausal, Previous Chemo and Radiation Fractures: Treatments: claritin, LEVOTHYROXINE, Vitamin D ASSESSMENT: The BMD measured at Femur Neck Left is 0.826 g/cm2 with a T-score of -1.5. This patient is considered osteopenic according to [facility]) criteria. Site Region Measured Measured WHO Young Adult BMD Date       Age       Classification T-score AP Spine L1-L4 1/08/[age] 67.9 Normal -0.1 1.177 g/cm2 AP Spine L1-L4 01/19/2013 64.0 Normal -0.1 1.180 g/cm2 AP Spine L1-L4 01/24/2010 61.0 Normal -0.3 1.158 g/cm2 AP Spine L1-L4 01/24/2010 61.0 Normal -0.3 1.158 g/cm2 DualFemur Neck Left 01/08/2017 67.9 Osteopenia -1.5 0.826 g/cm2 DualFemur Neck Left 01/19/2013 64.0 Osteopenia -1.2 0.870 g/cm2 DualFemur Neck Left 01/24/2010 61.0 Normal -1.0 0.904 g/cm2 DualFemur Neck Left 01/24/2010 61.0 Normal -1.0 0.904 g/cm2 World Health Organization [facility]) criteria for post-menopausal, Caucasian Women: Normal:       T-score at or above -1 SD Osteopenia:   T-score between -1 and -2.5 SD Osteoporosis: T-score at or below -2.5 SD RECOMMENDATIONS: [place] recommends that FDA-approved medical therapies be considered in postmenopausal women and men age 51 or older with a: 1. Hip or vertebral (clinical or morphometric) fracture. 2. T-score of < -2.5 at the spine or hip. 3. Ten-year  fracture probability by FRAX of 3% or greater for hip fracture or 20% or greater for major osteoporotic fracture. All treatment decisions require clinical judgment and consideration of individual patient factors, including patient preferences, co-morbidities, previous drug use, risk factors not captured in the FRAX model (e.g. falls, vitamin D deficiency, increased bone turnover, interval significant decline in bone density) and possible under - or over-estimation of fracture risk by FRAX. All patients should ensure an adequate intake of dietary calcium (1200 mg/d) and vitamin D (800 IU daily) unless contraindicated. FOLLOW-UP: People with diagnosed cases of osteoporosis or at high risk for fracture should have regular bone mineral density tests. For patients eligible for Medicare, routine testing is allowed once every 2 years. The testing frequency can be increased to one year for patients who have rapidly progressing disease, those who are receiving or  discontinuing medical therapy to restore bone mass, or have additional risk factors. I have reviewed this report, and agree with the above findings. [facility] Dear Dr. Derrel Nip, Your patient LOANA SALVAGGIO completed a FRAX assessment on 01/08/2017 using the [place] (analysis version: 14.10) manufactured by [employer]. The following summarizes the results of our evaluation. PATIENT BIOGRAPHICAL: Name: Christel, Bai Patient ID: [id number] Birth Date: September 03, 1951 Height:    62.0 in. Gender:     Female    [age]       Weight:    176.0 lbs. Ethnicity:  White                            Exam Date: 01/08/2017 FRAX* RESULTS:  (version: 3.5) 10-year Probability of Fracture1 Major Osteoporotic Fracture2 Hip Fracture 9.2% 1.1% Population: [place] (Caucasian) Risk Factors: None Based on Femur (Left) Neck BMD 1 -The 10-year probability of fracture may be lower than reported if the patient has received treatment. 2 -Major Osteoporotic Fracture: Clinical Spine, Forearm, Hip or Shoulder *FRAX is a Materials engineer of the [employer] for Metabolic Bone Disease, a [place] (WHO) [facility]. ASSESSMENT: The probability of a major osteoporotic fracture is 9.2% within the next ten years. The probability of a hip fracture is 1.1% within the next ten years. . Electronically Signed   By: Dorise Bullion III M.D   On: 01/08/2017 14:46    Assessment & Plan:   Problem List Items Addressed This Visit    None      I am having Lannette Donath. Keeny maintain her loratadine, multivitamin, Probiotic Product (PROBIOTIC DAILY PO), albuterol, triamcinolone, azelastine, metoprolol succinate, levothyroxine, Vitamin D, sulfamethoxazole-trimethoprim, losartan, gabapentin, and metoCLOPramide.  No orders of the defined types were placed in this encounter.   There are no discontinued medications.  Follow-up: No follow-ups on file.   Crecencio Mc, MD

## 2018-02-21 ENCOUNTER — Encounter: Payer: Self-pay | Admitting: Internal Medicine

## 2018-02-21 LAB — URINE CULTURE
MICRO NUMBER: 90627730
RESULT: NO GROWTH
SPECIMEN QUALITY: ADEQUATE

## 2018-02-22 NOTE — Assessment & Plan Note (Addendum)
s/p left pneumonectomy 2 weeks ago for recurrence. 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  She has some post thoracotomy pain managed at times suboptimally.  Adding lidocaine ointment

## 2018-02-25 ENCOUNTER — Encounter: Payer: Self-pay | Admitting: Internal Medicine

## 2018-02-25 ENCOUNTER — Ambulatory Visit
Admit: 2018-02-25 | Discharge: 2018-02-26 | Payer: MEDICARE | Attending: Nurse Practitioner | Primary: Nurse Practitioner

## 2018-02-25 ENCOUNTER — Ambulatory Visit: Admit: 2018-02-25 | Discharge: 2018-02-26 | Payer: MEDICARE

## 2018-02-25 ENCOUNTER — Ambulatory Visit: Admit: 2018-02-25 | Discharge: 2018-02-26 | Payer: MEDICARE | Attending: Otolaryngology | Primary: Otolaryngology

## 2018-02-25 DIAGNOSIS — C3412 Malignant neoplasm of upper lobe, left bronchus or lung: Principal | ICD-10-CM

## 2018-02-25 DIAGNOSIS — J3800 Paralysis of vocal cords and larynx, unspecified: Secondary | ICD-10-CM | POA: Diagnosis not present

## 2018-02-25 DIAGNOSIS — I10 Essential (primary) hypertension: Secondary | ICD-10-CM | POA: Diagnosis not present

## 2018-02-25 DIAGNOSIS — R1310 Dysphagia, unspecified: Secondary | ICD-10-CM | POA: Diagnosis not present

## 2018-02-25 DIAGNOSIS — C3490 Malignant neoplasm of unspecified part of unspecified bronchus or lung: Secondary | ICD-10-CM | POA: Diagnosis not present

## 2018-02-25 DIAGNOSIS — R05 Cough: Secondary | ICD-10-CM | POA: Diagnosis not present

## 2018-02-25 DIAGNOSIS — Z08 Encounter for follow-up examination after completed treatment for malignant neoplasm: Secondary | ICD-10-CM | POA: Diagnosis not present

## 2018-02-25 DIAGNOSIS — F17210 Nicotine dependence, cigarettes, uncomplicated: Secondary | ICD-10-CM | POA: Diagnosis not present

## 2018-02-25 DIAGNOSIS — Z85118 Personal history of other malignant neoplasm of bronchus and lung: Secondary | ICD-10-CM | POA: Diagnosis not present

## 2018-02-25 DIAGNOSIS — R918 Other nonspecific abnormal finding of lung field: Secondary | ICD-10-CM

## 2018-02-26 ENCOUNTER — Other Ambulatory Visit (INDEPENDENT_AMBULATORY_CARE_PROVIDER_SITE_OTHER): Payer: Medicare Other

## 2018-02-26 DIAGNOSIS — Z1159 Encounter for screening for other viral diseases: Secondary | ICD-10-CM | POA: Diagnosis not present

## 2018-02-26 DIAGNOSIS — D649 Anemia, unspecified: Secondary | ICD-10-CM

## 2018-02-26 NOTE — Addendum Note (Signed)
Addended by: Leeanne Rio on: 02/26/2018 02:48 PM   Modules accepted: Orders

## 2018-02-27 LAB — COMPREHENSIVE METABOLIC PANEL
AG RATIO: 1.8 (calc) (ref 1.0–2.5)
ALT: 11 U/L (ref 6–29)
AST: 14 U/L (ref 10–35)
Albumin: 4.4 g/dL (ref 3.6–5.1)
Alkaline phosphatase (APISO): 99 U/L (ref 33–130)
BILIRUBIN TOTAL: 0.2 mg/dL (ref 0.2–1.2)
BUN/Creatinine Ratio: 19 (calc) (ref 6–22)
BUN: 21 mg/dL (ref 7–25)
CALCIUM: 10 mg/dL (ref 8.6–10.4)
CHLORIDE: 105 mmol/L (ref 98–110)
CO2: 29 mmol/L (ref 20–32)
Creat: 1.1 mg/dL — ABNORMAL HIGH (ref 0.50–0.99)
GLUCOSE: 106 mg/dL — AB (ref 65–99)
Globulin: 2.5 g/dL (calc) (ref 1.9–3.7)
Potassium: 4.5 mmol/L (ref 3.5–5.3)
Sodium: 143 mmol/L (ref 135–146)
Total Protein: 6.9 g/dL (ref 6.1–8.1)

## 2018-02-27 LAB — CBC WITH DIFFERENTIAL/PLATELET
BASOS ABS: 83 {cells}/uL (ref 0–200)
Basophils Relative: 1 %
EOS PCT: 10.8 %
Eosinophils Absolute: 896 cells/uL — ABNORMAL HIGH (ref 15–500)
HEMATOCRIT: 33.2 % — AB (ref 35.0–45.0)
Hemoglobin: 11.3 g/dL — ABNORMAL LOW (ref 11.7–15.5)
Lymphs Abs: 996 cells/uL (ref 850–3900)
MCH: 28.9 pg (ref 27.0–33.0)
MCHC: 34 g/dL (ref 32.0–36.0)
MCV: 84.9 fL (ref 80.0–100.0)
MONOS PCT: 9.6 %
MPV: 9.9 fL (ref 7.5–12.5)
NEUTROS PCT: 66.6 %
Neutro Abs: 5528 cells/uL (ref 1500–7800)
Platelets: 514 10*3/uL — ABNORMAL HIGH (ref 140–400)
RBC: 3.91 10*6/uL (ref 3.80–5.10)
RDW: 12.5 % (ref 11.0–15.0)
Total Lymphocyte: 12 %
WBC mixed population: 797 cells/uL (ref 200–950)
WBC: 8.3 10*3/uL (ref 3.8–10.8)

## 2018-02-27 LAB — IRON,TIBC AND FERRITIN PANEL
%SAT: 9 % — AB (ref 11–50)
Ferritin: 176 ng/mL (ref 20–288)
IRON: 36 ug/dL — AB (ref 45–160)
TIBC: 379 ug/dL (ref 250–450)

## 2018-02-27 LAB — MEASLES/MUMPS/RUBELLA IMMUNITY
MUMPS IGG: 273 [AU]/ml
RUBELLA: 19.6 {index}
Rubeola IgG: >300 AU/mL

## 2018-03-03 DIAGNOSIS — H2513 Age-related nuclear cataract, bilateral: Secondary | ICD-10-CM | POA: Diagnosis not present

## 2018-03-04 ENCOUNTER — Ambulatory Visit
Admit: 2018-03-04 | Discharge: 2018-03-30 | Payer: MEDICARE | Attending: Radiation Oncology | Primary: Radiation Oncology

## 2018-03-04 DIAGNOSIS — J3800 Paralysis of vocal cords and larynx, unspecified: Secondary | ICD-10-CM | POA: Diagnosis not present

## 2018-03-04 DIAGNOSIS — G8912 Acute post-thoracotomy pain: Secondary | ICD-10-CM | POA: Diagnosis not present

## 2018-03-04 DIAGNOSIS — Z902 Acquired absence of lung [part of]: Secondary | ICD-10-CM | POA: Diagnosis not present

## 2018-03-04 DIAGNOSIS — Z79899 Other long term (current) drug therapy: Secondary | ICD-10-CM | POA: Diagnosis not present

## 2018-03-04 DIAGNOSIS — Z791 Long term (current) use of non-steroidal anti-inflammatories (NSAID): Secondary | ICD-10-CM | POA: Diagnosis not present

## 2018-03-04 DIAGNOSIS — C3492 Malignant neoplasm of unspecified part of left bronchus or lung: Secondary | ICD-10-CM | POA: Diagnosis not present

## 2018-03-04 DIAGNOSIS — Z9221 Personal history of antineoplastic chemotherapy: Secondary | ICD-10-CM | POA: Diagnosis not present

## 2018-03-04 DIAGNOSIS — Z923 Personal history of irradiation: Secondary | ICD-10-CM | POA: Diagnosis not present

## 2018-03-04 DIAGNOSIS — C3412 Malignant neoplasm of upper lobe, left bronchus or lung: Secondary | ICD-10-CM | POA: Diagnosis not present

## 2018-03-11 ENCOUNTER — Ambulatory Visit
Admit: 2018-03-11 | Discharge: 2018-03-12 | Payer: MEDICARE | Attending: Hematology & Oncology | Primary: Hematology & Oncology

## 2018-03-11 ENCOUNTER — Ambulatory Visit: Admit: 2018-03-11 | Discharge: 2018-03-12 | Payer: MEDICARE | Attending: Otolaryngology | Primary: Otolaryngology

## 2018-03-11 DIAGNOSIS — J3800 Paralysis of vocal cords and larynx, unspecified: Secondary | ICD-10-CM | POA: Diagnosis not present

## 2018-03-11 DIAGNOSIS — C3492 Malignant neoplasm of unspecified part of left bronchus or lung: Secondary | ICD-10-CM | POA: Diagnosis not present

## 2018-04-09 ENCOUNTER — Other Ambulatory Visit: Payer: Self-pay | Admitting: Internal Medicine

## 2018-04-24 ENCOUNTER — Ambulatory Visit: Admit: 2018-04-24 | Discharge: 2018-04-25 | Payer: MEDICARE | Attending: Otolaryngology | Primary: Otolaryngology

## 2018-04-24 DIAGNOSIS — J3800 Paralysis of vocal cords and larynx, unspecified: Principal | ICD-10-CM

## 2018-04-29 DIAGNOSIS — J3801 Paralysis of vocal cords and larynx, unilateral: Principal | ICD-10-CM

## 2018-04-30 ENCOUNTER — Encounter
Admit: 2018-04-30 | Discharge: 2018-05-01 | Payer: MEDICARE | Attending: Student in an Organized Health Care Education/Training Program | Primary: Student in an Organized Health Care Education/Training Program

## 2018-04-30 ENCOUNTER — Ambulatory Visit: Admit: 2018-04-30 | Discharge: 2018-05-01 | Payer: MEDICARE

## 2018-04-30 DIAGNOSIS — Z885 Allergy status to narcotic agent status: Secondary | ICD-10-CM | POA: Diagnosis not present

## 2018-04-30 DIAGNOSIS — J3801 Paralysis of vocal cords and larynx, unilateral: Secondary | ICD-10-CM | POA: Diagnosis not present

## 2018-04-30 DIAGNOSIS — J3800 Paralysis of vocal cords and larynx, unspecified: Secondary | ICD-10-CM | POA: Diagnosis not present

## 2018-04-30 DIAGNOSIS — Z85118 Personal history of other malignant neoplasm of bronchus and lung: Secondary | ICD-10-CM | POA: Diagnosis not present

## 2018-04-30 DIAGNOSIS — Z79899 Other long term (current) drug therapy: Secondary | ICD-10-CM | POA: Diagnosis not present

## 2018-04-30 DIAGNOSIS — K219 Gastro-esophageal reflux disease without esophagitis: Secondary | ICD-10-CM | POA: Diagnosis not present

## 2018-04-30 DIAGNOSIS — R011 Cardiac murmur, unspecified: Secondary | ICD-10-CM | POA: Diagnosis not present

## 2018-04-30 DIAGNOSIS — F329 Major depressive disorder, single episode, unspecified: Secondary | ICD-10-CM | POA: Diagnosis not present

## 2018-04-30 DIAGNOSIS — I10 Essential (primary) hypertension: Secondary | ICD-10-CM | POA: Diagnosis not present

## 2018-04-30 DIAGNOSIS — E039 Hypothyroidism, unspecified: Secondary | ICD-10-CM | POA: Diagnosis not present

## 2018-04-30 DIAGNOSIS — M1990 Unspecified osteoarthritis, unspecified site: Secondary | ICD-10-CM | POA: Diagnosis not present

## 2018-04-30 DIAGNOSIS — Z902 Acquired absence of lung [part of]: Secondary | ICD-10-CM | POA: Diagnosis not present

## 2018-04-30 DIAGNOSIS — Z87891 Personal history of nicotine dependence: Secondary | ICD-10-CM | POA: Diagnosis not present

## 2018-05-01 DIAGNOSIS — M1990 Unspecified osteoarthritis, unspecified site: Secondary | ICD-10-CM | POA: Diagnosis not present

## 2018-05-01 DIAGNOSIS — Z902 Acquired absence of lung [part of]: Secondary | ICD-10-CM | POA: Diagnosis not present

## 2018-05-01 DIAGNOSIS — K219 Gastro-esophageal reflux disease without esophagitis: Secondary | ICD-10-CM | POA: Diagnosis not present

## 2018-05-01 DIAGNOSIS — I10 Essential (primary) hypertension: Secondary | ICD-10-CM | POA: Diagnosis not present

## 2018-05-01 DIAGNOSIS — J3801 Paralysis of vocal cords and larynx, unilateral: Secondary | ICD-10-CM | POA: Diagnosis not present

## 2018-05-01 DIAGNOSIS — Z85118 Personal history of other malignant neoplasm of bronchus and lung: Secondary | ICD-10-CM | POA: Diagnosis not present

## 2018-05-01 MED ORDER — AMOXICILLIN 875 MG-POTASSIUM CLAVULANATE 125 MG TABLET
ORAL_TABLET | Freq: Two times a day (BID) | ORAL | 0 refills | 0 days | Status: CP
Start: 2018-05-01 — End: 2018-05-08

## 2018-05-01 MED ORDER — OXYCODONE 5 MG TABLET
ORAL_TABLET | ORAL | 0 refills | 0 days | Status: CP | PRN
Start: 2018-05-01 — End: 2018-05-06

## 2018-05-01 MED ORDER — PANTOPRAZOLE 40 MG TABLET,DELAYED RELEASE
ORAL_TABLET | Freq: Every morning | ORAL | 0 refills | 0.00000 days | Status: CP
Start: 2018-05-01 — End: 2018-09-18

## 2018-05-01 MED ORDER — PANTOPRAZOLE 40 MG TABLET,DELAYED RELEASE: tablet | 0 refills | 0 days | Status: AC

## 2018-05-01 MED ORDER — METHYLPREDNISOLONE 4 MG TABLETS IN A DOSE PACK
PACK | 0 refills | 0 days | Status: CP
Start: 2018-05-01 — End: 2018-06-19

## 2018-05-02 HISTORY — PX: THROAT SURGERY: SHX803

## 2018-05-06 ENCOUNTER — Telehealth: Payer: Self-pay | Admitting: Radiology

## 2018-05-06 NOTE — Telephone Encounter (Signed)
Pt coming in for labs tomorrow, please place future orders. Thank you.  

## 2018-05-07 ENCOUNTER — Other Ambulatory Visit: Payer: Medicare Other

## 2018-05-07 ENCOUNTER — Telehealth: Payer: Self-pay

## 2018-05-07 DIAGNOSIS — R7301 Impaired fasting glucose: Secondary | ICD-10-CM

## 2018-05-07 DIAGNOSIS — E559 Vitamin D deficiency, unspecified: Secondary | ICD-10-CM

## 2018-05-07 DIAGNOSIS — D649 Anemia, unspecified: Secondary | ICD-10-CM

## 2018-05-07 DIAGNOSIS — E039 Hypothyroidism, unspecified: Secondary | ICD-10-CM

## 2018-05-07 DIAGNOSIS — I10 Essential (primary) hypertension: Secondary | ICD-10-CM

## 2018-05-07 DIAGNOSIS — E7800 Pure hypercholesterolemia, unspecified: Secondary | ICD-10-CM

## 2018-05-07 NOTE — Telephone Encounter (Signed)
Labs have been ordered for lab appt.

## 2018-05-08 ENCOUNTER — Ambulatory Visit: Admit: 2018-05-08 | Discharge: 2018-05-09 | Payer: MEDICARE | Attending: Otolaryngology | Primary: Otolaryngology

## 2018-05-08 DIAGNOSIS — J3800 Paralysis of vocal cords and larynx, unspecified: Secondary | ICD-10-CM | POA: Diagnosis not present

## 2018-05-09 ENCOUNTER — Encounter: Payer: Self-pay | Admitting: Internal Medicine

## 2018-05-09 ENCOUNTER — Ambulatory Visit (INDEPENDENT_AMBULATORY_CARE_PROVIDER_SITE_OTHER): Payer: Medicare Other | Admitting: Internal Medicine

## 2018-05-09 VITALS — BP 130/92 | HR 100 | Temp 98.6°F | Resp 15 | Ht 62.0 in | Wt 177.6 lb

## 2018-05-09 DIAGNOSIS — F419 Anxiety disorder, unspecified: Secondary | ICD-10-CM

## 2018-05-09 DIAGNOSIS — Z1231 Encounter for screening mammogram for malignant neoplasm of breast: Secondary | ICD-10-CM

## 2018-05-09 DIAGNOSIS — R000 Tachycardia, unspecified: Secondary | ICD-10-CM

## 2018-05-09 DIAGNOSIS — I10 Essential (primary) hypertension: Secondary | ICD-10-CM

## 2018-05-09 DIAGNOSIS — F329 Major depressive disorder, single episode, unspecified: Secondary | ICD-10-CM

## 2018-05-09 DIAGNOSIS — R0989 Other specified symptoms and signs involving the circulatory and respiratory systems: Secondary | ICD-10-CM | POA: Diagnosis not present

## 2018-05-09 DIAGNOSIS — E039 Hypothyroidism, unspecified: Secondary | ICD-10-CM | POA: Diagnosis not present

## 2018-05-09 DIAGNOSIS — Z1239 Encounter for other screening for malignant neoplasm of breast: Secondary | ICD-10-CM

## 2018-05-09 MED ORDER — BUPROPION HCL ER (XL) 150 MG PO TB24: 150.0000 mg | ORAL_TABLET | Freq: Every day | ORAL | 1 refills | Status: DC

## 2018-05-09 MED ORDER — FLUCONAZOLE 150 MG PO TABS: 150.0000 mg | ORAL_TABLET | Freq: Every day | ORAL | 0 refills | Status: DC

## 2018-05-09 MED ORDER — DIAZEPAM 5 MG PO TABS: 5.0000 mg | ORAL_TABLET | Freq: Two times a day (BID) | ORAL | 0 refills | Status: DC | PRN

## 2018-05-09 NOTE — Progress Notes (Signed)
Patient ID: Stacy Santana, female    DOB: October 13, 1950  Age: 67 y.o. MRN: [id number]  The patient is here for annual follow up and management of other chronic and acute problems.   Up to date on mammogram and colonoscopy Not fasting today   The risk factors are reflected in the social history.  The roster of all physicians providing medical care to patient - is listed in the Snapshot section of the chart.  Activities of daily living:  The patient is 100% independent in all ADLs: dressing, toileting, feeding as well as independent mobility  Home safety : The patient has smoke detectors in the home. They wear seatbelts.  There are no firearms at home. There is no violence in the home.   There is no risks for hepatitis, STDs or HIV. There is no   history of blood transfusion. They have no travel history to infectious disease endemic areas of the world.  The patient has seen their dentist in the last six month. They have seen their eye doctor in the last year. They admit to slight hearing difficulty with regard to whispered voices and some television programs.  They have deferred audiologic testing in the last year.  They do not  have excessive sun exposure. Discussed the need for sun protection: hats, long sleeves and use of sunscreen if there is significant sun exposure.   Diet: the importance of a healthy diet is discussed. They do have a healthy diet.  The benefits of regular aerobic exercise were discussed. She walks 4 times per week ,  20 minutes.   Depression screen: there are no signs or vegative symptoms of depression- irritability, change in appetite, anhedonia, sadness/tearfullness.  Cognitive assessment: the patient manages all their financial and personal affairs and is actively engaged. They could relate day,date,year and events; recalled 2/3 objects at 3 minutes; performed clock-face test normally.  The following portions of the patient's history were reviewed and updated as  appropriate: allergies, current medications, past family history, past medical history,  past surgical history, past social history  and problem list.  Visual acuity was not assessed per patient preference since she has regular follow up with her ophthalmologist. Hearing and body mass index were assessed and reviewed.   During the course of the visit the patient was educated and counseled about appropriate screening and preventive services including : fall prevention , diabetes screening, nutrition counseling, colorectal cancer screening, and recommended immunizations.    CC: The primary encounter diagnosis was Tachycardia. Diagnoses of Left carotid bruit, Essential hypertension, Acquired hypothyroidism, Screening for breast cancer, and Anxiety and depression were also pertinent to this visit.  Vocal cord paralysis from  July 2018 LUL wedge resection  Lobectomy and LN rescection , received injection of Cymetra to glootic fold for vocal cord immobility  July 2019 MDL with left immunization thyroplasty  With improved voice quality .  Finished  steroids Tuesday  Feels lousy since stopping steroids.  Some aspiration of water, but otherwise tolerating solids.  Cough is stronger    Lung Cancer: left pneumonectomy in May for 5. 6 cm invasive mucinous adenoCA  Based on abnormal PETcan oduring follow up.  Node negative (0/)) Last heme onc appt was June .  No more chemo advised   Fatigue:  Wants to resume wellbutrin 150 mg starting dose  Resuming Gabapentin for left thoracotomy pain  Some dizziness   Resting tacychardia .  Had an ECHO; re pnuemonectomy History Stacy Santana has a past medical history  of Allergy, Carpal tunnel syndrome, Degenerative joint disease, Depression, Endometriosis (1989), Glaucoma, HSV infection, Hypertension, Osteoporosis, and Vitamin D deficiency.   She has a past surgical history that includes Nasal sinus surgery (2012); laparoscopy (1999); Appendectomy (1964); Tonsillectomy; Tubal  ligation (1992); Carpal tunnel release (Right, 01/2014); Foot neuroma surgery (May 2016); and Throat surgery (05/02/2018).   Her family history includes ADD / ADHD in her son and son; Anxiety disorder in her son; Arthritis in her mother; COPD in her maternal grandfather and mother; Cancer in her father; Depression in her maternal uncle and son; Heart disease in her maternal grandmother; Hypertension in her mother; Hypertrophic cardiomyopathy in her sister; Osteoporosis in her mother; Rheum arthritis in her mother; Schizophrenia in her paternal aunt; Stroke in her mother.She reports that she quit smoking about 34 years ago. Her smoking use included cigarettes. She has a 16.00 pack-year smoking history. She has never used smokeless tobacco. She reports that she drinks alcohol. She reports that she does not use drugs.  Outpatient Medications Prior to Visit  Medication Sig Dispense Refill  . azelastine (ASTELIN) 0.1 % nasal spray PLACE 1 SPRAY INTO BOTH NOSTRILS AT BEDTIME AS NEEDED 30 mL 6  . Cholecalciferol (VITAMIN D) 400 UNIT/ML LIQD Take by mouth.    . gabapentin (NEURONTIN) 300 MG capsule Take 300 mg by mouth 3 (three) times daily.    [name] levothyroxine (SYNTHROID, LEVOTHROID) 50 MCG tablet TAKE 1 TABLET(50 MCG) BY MOUTH DAILY 90 tablet 1  . loratadine (CLARITIN) 10 MG tablet Take 10 mg by mouth daily.      [name] losartan (COZAAR) 100 MG tablet TAKE 1 TABLET(100 MG) BY MOUTH DAILY 90 tablet 1  . meloxicam (MOBIC) 15 MG tablet Take 1 tablet (15 mg total) by mouth daily. 30 tablet 5  . metoprolol succinate (TOPROL-XL) 50 MG 24 hr tablet TAKE 1 TABLET(50 MG) BY MOUTH DAILY 90 tablet 1  . Multiple Vitamin (MULTIVITAMIN) tablet Take 1 tablet by mouth daily.    . pantoprazole (PROTONIX) 40 MG tablet   0  . Probiotic Product (PROBIOTIC DAILY PO) Take by mouth.    . triamcinolone (NASACORT) 55 MCG/ACT AERO nasal inhaler Place into the nose.    . albuterol (PROVENTIL HFA;VENTOLIN HFA) 108 (90 Base) MCG/ACT  inhaler Inhale into the lungs.    . lidocaine (XYLOCAINE) 5 % ointment Apply 1 application topically as needed. (Patient not taking: Reported on 05/09/2018) 35.44 g 0  . sulfamethoxazole-trimethoprim (BACTRIM DS,SEPTRA DS) 800-160 MG tablet Take 1 tablet by mouth 2 (two) times daily. (Patient not taking: Reported on 02/20/2018) 6 tablet 0   No facility-administered medications prior to visit.     Review of Systems   Patient denies headache, fevers, malaise, unintentional weight loss, skin rash, eye pain, sinus congestion and sinus pain, sore throat, dysphagia,  hemoptysis , cough, dyspnea, wheezing, chest pain, palpitations, orthopnea, edema, abdominal pain, nausea, melena, diarrhea, constipation, flank pain, dysuria, hematuria, urinary  Frequency, nocturia, numbness, tingling, seizures,  Focal weakness, Loss of consciousness,  Tremor, insomnia, depression, anxiety, and suicidal ideation.      Objective:  BP (!) 130/92 (BP Location: Left Arm, Patient Position: Sitting, Cuff Size: Normal)   Pulse 100   Temp 98.6 F (37 C) (Oral)   Resp 15   Ht 5\' 2"  (1.575 m)   Wt 177 lb 9.6 oz (80.6 kg)   SpO2 97%   BMI 32.48 kg/m   Physical Exam   General appearance: alert, cooperative and appears stated age Head: Normocephalic,  without obvious abnormality, atraumatic Eyes: conjunctivae/corneas clear. PERRL, EOM's intact. Fundi benign. Ears: normal TM's and external ear canals both ears Nose: Nares normal. Septum midline. Mucosa normal. No drainage or sinus tenderness. Throat: lips, mucosa, and tongue normal; teeth and gums normal Neck: no adenopathy, no carotid bruit, no JVD, supple, symmetrical, trachea midline and thyroid not enlarged, symmetric, no tenderness/mass/nodules Lungs: clear to auscultation on right, ,  No BS on left. (s/p left pneumonectomy) Breasts: normal appearance, no masses or tenderness Heart: regular rate and rhythm, S1, S2 normal, no murmur, click, rub or gallop Back:  Well  healed thoracotomy scar, left side  Abdomen: soft, non-tender; bowel sounds normal; no masses,  no organomegaly Extremities: extremities normal, atraumatic, no cyanosis or edema Pulses: 2+ and symmetric Skin: Skin color, texture, turgor normal. No rashes or lesions Neurologic: Alert and oriented X 3, normal strength and tone. Normal symmetric reflexes. Normal coordination and gait.      Assessment & Plan:   Problem List Items Addressed This Visit    Hypertension (Chronic)    Well controlled on current regimen. Renal function stable, no changes today.  Lab Results  Component Value Date   CREATININE 1.10 (H) 02/26/2018   Lab Results  Component Value Date   NA 143 02/26/2018   K 4.5 02/26/2018   CL 105 02/26/2018   CO2 29 02/26/2018         Hypothyroidism    Thyroid function is WNL on current dose.  No current changes needed.   Lab Results  Component Value Date   TSH 1.57 08/02/2017         Screening for breast cancer    Mammogram (annual) ordered      Anxiety and depression    Resuming wellbutrin for depression with low energy       Relevant Medications   buPROPion (WELLBUTRIN XL) 150 MG 24 hr tablet   diazepam (VALIUM) 5 MG tablet   Tachycardia - Primary   Left carotid bruit    Noticed on exam today.   Cardiology consult for tachycardia,  Will address with ultrasound then         A total of 40 minutes was spent with patient more than half of which was spent in counseling patient on the above mentioned issues , reviewing and explaining recent labs and imaging studies done, and coordination of care.  I have discontinued Lannette Donath. Bishara's sulfamethoxazole-trimethoprim and lidocaine. I am also having her start on buPROPion, diazepam, and fluconazole. Additionally, I am having her maintain her loratadine, multivitamin, Probiotic Product (PROBIOTIC DAILY PO), albuterol, triamcinolone, azelastine, levothyroxine, Vitamin D, losartan, gabapentin, meloxicam, metoprolol  succinate, and pantoprazole.  Meds ordered this encounter  Medications  . buPROPion (WELLBUTRIN XL) 150 MG 24 hr tablet    Sig: Take 1 tablet (150 mg total) by mouth daily.    Dispense:  90 tablet    Refill:  1  . diazepam (VALIUM) 5 MG tablet    Sig: Take 1 tablet (5 mg total) by mouth every 12 (twelve) hours as needed for anxiety.    Dispense:  30 tablet    Refill:  0  . fluconazole (DIFLUCAN) 150 MG tablet    Sig: Take 1 tablet (150 mg total) by mouth daily.    Dispense:  2 tablet    Refill:  0    Medications Discontinued During This Encounter  Medication Reason  . sulfamethoxazole-trimethoprim (BACTRIM DS,SEPTRA DS) 800-160 MG tablet Completed Course  . lidocaine (XYLOCAINE) 5 %  ointment Patient has not taken in last 30 days    Follow-up: No follow-ups on file.   Crecencio Mc, MD

## 2018-05-09 NOTE — Assessment & Plan Note (Signed)
Noticed on exam today.   Cardiology consult for tachycardia,  Will address with ultrasound then

## 2018-05-09 NOTE — Patient Instructions (Addendum)
Increase your  toprol to 75 mg daily.  You can take it at bedtime  Your mammogram has been ordered.  You must now call [facility]  To make your appointment   [phone]  Cardiology referral pending what your surgeon recommends  Resume wellbutrin xl once daily starting with 150 mg dose  Fluconazole x 2 days for early thrush/ vaginitis   Health Maintenance for Postmenopausal Women Menopause is a normal process in which your reproductive ability comes to an end. This process happens gradually over a span of months to years, usually between the ages of 15 and 62. Menopause is complete when you have missed 12 consecutive menstrual periods. It is important to talk with your health care provider about some of the most common conditions that affect postmenopausal women, such as heart disease, cancer, and bone loss (osteoporosis). Adopting a healthy lifestyle and getting preventive care can help to promote your health and wellness. Those actions can also lower your chances of developing some of these common conditions. What should I know about menopause? During menopause, you may experience a number of symptoms, such as:  Moderate-to-severe hot flashes.  Night sweats.  Decrease in sex drive.  Mood swings.  Headaches.  Tiredness.  Irritability.  Memory problems.  Insomnia.  Choosing to treat or not to treat menopausal changes is an individual decision that you make with your health care provider. What should I know about hormone replacement therapy and supplements? Hormone therapy products are effective for treating symptoms that are associated with menopause, such as hot flashes and night sweats. Hormone replacement carries certain risks, especially as you become older. If you are thinking about using estrogen or estrogen with progestin treatments, discuss the benefits and risks with your health care provider. What should I know about heart disease and stroke? Heart disease, heart  attack, and stroke become more likely as you age. This may be due, in part, to the hormonal changes that your body experiences during menopause. These can affect how your body processes dietary fats, triglycerides, and cholesterol. Heart attack and stroke are both medical emergencies. There are many things that you can do to help prevent heart disease and stroke:  Have your blood pressure checked at least every 1-2 years. High blood pressure causes heart disease and increases the risk of stroke.  If you are 44-23 years old, ask your health care provider if you should take aspirin to prevent a heart attack or a stroke.  Do not use any tobacco products, including cigarettes, chewing tobacco, or electronic cigarettes. If you need help quitting, ask your health care provider.  It is important to eat a healthy diet and maintain a healthy weight. ? Be sure to include plenty of vegetables, fruits, low-fat dairy products, and lean protein. ? Avoid eating foods that are high in solid fats, added sugars, or salt (sodium).  Get regular exercise. This is one of the most important things that you can do for your health. ? Try to exercise for at least 150 minutes each week. The type of exercise that you do should increase your heart rate and make you sweat. This is known as moderate-intensity exercise. ? Try to do strengthening exercises at least twice each week. Do these in addition to the moderate-intensity exercise.  Know your numbers.Ask your health care provider to check your cholesterol and your blood glucose. Continue to have your blood tested as directed by your health care provider.  What should I know about cancer screening? There  are several types of cancer. Take the following steps to reduce your risk and to catch any cancer development as early as possible. Breast Cancer  Practice breast self-awareness. ? This means understanding how your breasts normally appear and feel. ? It also means  doing regular breast self-exams. Let your health care provider know about any changes, no matter how small.  If you are 40 or older, have a clinician do a breast exam (clinical breast exam or CBE) every year. Depending on your age, family history, and medical history, it may be recommended that you also have a yearly breast X-ray (mammogram).  If you have a family history of breast cancer, talk with your health care provider about genetic screening.  If you are at high risk for breast cancer, talk with your health care provider about having an MRI and a mammogram every year.  Breast cancer (BRCA) gene test is recommended for women who have family members with BRCA-related cancers. Results of the assessment will determine the need for genetic counseling and BRCA1 and for BRCA2 testing. BRCA-related cancers include these types: ? Breast. This occurs in males or females. ? Ovarian. ? Tubal. This may also be called fallopian tube cancer. ? Cancer of the abdominal or pelvic lining (peritoneal cancer). ? Prostate. ? Pancreatic.  Cervical, Uterine, and Ovarian Cancer Your health care provider may recommend that you be screened regularly for cancer of the pelvic organs. These include your ovaries, uterus, and vagina. This screening involves a pelvic exam, which includes checking for microscopic changes to the surface of your cervix (Pap test).  For women ages 21-65, health care providers may recommend a pelvic exam and a Pap test every three years. For women ages 30-65, they may recommend the Pap test and pelvic exam, combined with testing for human papilloma virus (HPV), every five years. Some types of HPV increase your risk of cervical cancer. Testing for HPV may also be done on women of any age who have unclear Pap test results.  Other health care providers may not recommend any screening for nonpregnant women who are considered low risk for pelvic cancer and have no symptoms. Ask your health care  provider if a screening pelvic exam is right for you.  If you have had past treatment for cervical cancer or a condition that could lead to cancer, you need Pap tests and screening for cancer for at least 20 years after your treatment. If Pap tests have been discontinued for you, your risk factors (such as having a new sexual partner) need to be reassessed to determine if you should start having screenings again. Some women have medical problems that increase the chance of getting cervical cancer. In these cases, your health care provider may recommend that you have screening and Pap tests more often.  If you have a family history of uterine cancer or ovarian cancer, talk with your health care provider about genetic screening.  If you have vaginal bleeding after reaching menopause, tell your health care provider.  There are currently no reliable tests available to screen for ovarian cancer.  Lung Cancer Lung cancer screening is recommended for adults 55-80 years old who are at high risk for lung cancer because of a history of smoking. A yearly low-dose CT scan of the lungs is recommended if you:  Currently smoke.  Have a history of at least 30 pack-years of smoking and you currently smoke or have quit within the past 15 years. A pack-year is smoking an average   of one pack of cigarettes per day for one year.  Yearly screening should:  Continue until it has been 15 years since you quit.  Stop if you develop a health problem that would prevent you from having lung cancer treatment.  Colorectal Cancer  This type of cancer can be detected and can often be prevented.  Routine colorectal cancer screening usually begins at age 50 and continues through age 75.  If you have risk factors for colon cancer, your health care provider may recommend that you be screened at an earlier age.  If you have a family history of colorectal cancer, talk with your health care provider about genetic  screening.  Your health care provider may also recommend using home test kits to check for hidden blood in your stool.  A small camera at the end of a tube can be used to examine your colon directly (sigmoidoscopy or colonoscopy). This is done to check for the earliest forms of colorectal cancer.  Direct examination of the colon should be repeated every 5-10 years until age 75. However, if early forms of precancerous polyps or small growths are found or if you have a family history or genetic risk for colorectal cancer, you may need to be screened more often.  Skin Cancer  Check your skin from head to toe regularly.  Monitor any moles. Be sure to tell your health care provider: ? About any new moles or changes in moles, especially if there is a change in a mole's shape or color. ? If you have a mole that is larger than the size of a pencil eraser.  If any of your family members has a history of skin cancer, especially at a young age, talk with your health care provider about genetic screening.  Always use sunscreen. Apply sunscreen liberally and repeatedly throughout the day.  Whenever you are outside, protect yourself by wearing long sleeves, pants, a wide-brimmed hat, and sunglasses.  What should I know about osteoporosis? Osteoporosis is a condition in which bone destruction happens more quickly than new bone creation. After menopause, you may be at an increased risk for osteoporosis. To help prevent osteoporosis or the bone fractures that can happen because of osteoporosis, the following is recommended:  If you are 19-50 years old, get at least 1,000 mg of calcium and at least 600 mg of vitamin D per day.  If you are older than age 50 but younger than age 70, get at least 1,200 mg of calcium and at least 600 mg of vitamin D per day.  If you are older than age 70, get at least 1,200 mg of calcium and at least 800 mg of vitamin D per day.  Smoking and excessive alcohol intake  increase the risk of osteoporosis. Eat foods that are rich in calcium and vitamin D, and do weight-bearing exercises several times each week as directed by your health care provider. What should I know about how menopause affects my mental health? Depression may occur at any age, but it is more common as you become older. Common symptoms of depression include:  Low or sad mood.  Changes in sleep patterns.  Changes in appetite or eating patterns.  Feeling an overall lack of motivation or enjoyment of activities that you previously enjoyed.  Frequent crying spells.  Talk with your health care provider if you think that you are experiencing depression. What should I know about immunizations? It is important that you get and maintain your immunizations. These   include:  Tetanus, diphtheria, and pertussis (Tdap) booster vaccine.  Influenza every year before the flu season begins.  Pneumonia vaccine.  Shingles vaccine.  Your health care provider may also recommend other immunizations. This information is not intended to replace advice given to you by your health care provider. Make sure you discuss any questions you have with your health care provider. Document Released: 11/09/2005 Document Revised: 04/06/2016 Document Reviewed: 06/21/2015 Elsevier Interactive Patient Education  2018 [employer].

## 2018-05-11 NOTE — Assessment & Plan Note (Signed)
Well controlled on current regimen. Renal function stable, no changes today.  Lab Results  Component Value Date   CREATININE 1.10 (H) 02/26/2018   Lab Results  Component Value Date   NA 143 02/26/2018   K 4.5 02/26/2018   CL 105 02/26/2018   CO2 29 02/26/2018

## 2018-05-11 NOTE — Assessment & Plan Note (Signed)
Resuming wellbutrin for depression with low energy

## 2018-05-11 NOTE — Assessment & Plan Note (Signed)
Mammogram (annual) ordered

## 2018-05-11 NOTE — Assessment & Plan Note (Signed)
Thyroid function is WNL on current dose.  No current changes needed.   Lab Results  Component Value Date   TSH 1.57 08/02/2017

## 2018-05-16 ENCOUNTER — Ambulatory Visit: Admit: 2018-05-16 | Discharge: 2018-05-16 | Payer: MEDICARE

## 2018-05-16 DIAGNOSIS — C3492 Malignant neoplasm of unspecified part of left bronchus or lung: Secondary | ICD-10-CM | POA: Diagnosis not present

## 2018-05-19 ENCOUNTER — Other Ambulatory Visit (INDEPENDENT_AMBULATORY_CARE_PROVIDER_SITE_OTHER): Payer: Medicare Other

## 2018-05-19 DIAGNOSIS — D649 Anemia, unspecified: Secondary | ICD-10-CM

## 2018-05-19 DIAGNOSIS — E559 Vitamin D deficiency, unspecified: Secondary | ICD-10-CM

## 2018-05-19 DIAGNOSIS — I10 Essential (primary) hypertension: Secondary | ICD-10-CM | POA: Diagnosis not present

## 2018-05-19 DIAGNOSIS — R7301 Impaired fasting glucose: Secondary | ICD-10-CM | POA: Diagnosis not present

## 2018-05-19 DIAGNOSIS — E039 Hypothyroidism, unspecified: Secondary | ICD-10-CM | POA: Diagnosis not present

## 2018-05-19 DIAGNOSIS — E7800 Pure hypercholesterolemia, unspecified: Secondary | ICD-10-CM

## 2018-05-19 LAB — CBC WITH DIFFERENTIAL/PLATELET
Basophils Absolute: 0.1 10*3/uL (ref 0.0–0.1)
Basophils Relative: 1 % (ref 0.0–3.0)
EOS ABS: 0.3 10*3/uL (ref 0.0–0.7)
Eosinophils Relative: 5.1 % — ABNORMAL HIGH (ref 0.0–5.0)
HEMATOCRIT: 39.3 % (ref 36.0–46.0)
HEMOGLOBIN: 13 g/dL (ref 12.0–15.0)
LYMPHS PCT: 12.7 % (ref 12.0–46.0)
Lymphs Abs: 0.8 10*3/uL (ref 0.7–4.0)
MCHC: 33 g/dL (ref 30.0–36.0)
MCV: 83.9 fl (ref 78.0–100.0)
MONO ABS: 0.5 10*3/uL (ref 0.1–1.0)
Monocytes Relative: 8.1 % (ref 3.0–12.0)
Neutro Abs: 4.6 10*3/uL (ref 1.4–7.7)
Neutrophils Relative %: 73.1 % (ref 43.0–77.0)
Platelets: 306 10*3/uL (ref 150.0–400.0)
RBC: 4.69 Mil/uL (ref 3.87–5.11)
RDW: 14 % (ref 11.5–15.5)
WBC: 6.3 10*3/uL (ref 4.0–10.5)

## 2018-05-19 LAB — LIPID PANEL
CHOLESTEROL: 190 mg/dL (ref 0–200)
HDL: 46.8 mg/dL (ref >39.00)
LDL Cholesterol: 107 mg/dL — ABNORMAL HIGH (ref 0–99)
NONHDL: 143.19
Total CHOL/HDL Ratio: 4
Triglycerides: 182 mg/dL — ABNORMAL HIGH (ref 0.0–149.0)
VLDL: 36.4 mg/dL (ref 0.0–40.0)

## 2018-05-19 LAB — COMPREHENSIVE METABOLIC PANEL
ALBUMIN: 4.4 g/dL (ref 3.5–5.2)
ALK PHOS: 91 U/L (ref 39–117)
ALT: 11 U/L (ref 0–35)
AST: 11 U/L (ref 0–37)
BUN: 12 mg/dL (ref 6–23)
CO2: 30 mEq/L (ref 19–32)
Calcium: 10.6 mg/dL — ABNORMAL HIGH (ref 8.4–10.5)
Chloride: 102 mEq/L (ref 96–112)
Creatinine, Ser: 1.09 mg/dL (ref 0.40–1.20)
GFR: 52.85 mL/min — AB (ref >60.00)
GLUCOSE: 99 mg/dL (ref 70–99)
POTASSIUM: 4.9 meq/L (ref 3.5–5.1)
SODIUM: 141 meq/L (ref 135–145)
TOTAL PROTEIN: 7.5 g/dL (ref 6.0–8.3)
Total Bilirubin: 0.5 mg/dL (ref 0.2–1.2)

## 2018-05-19 LAB — TSH: TSH: 2.82 u[IU]/mL (ref 0.35–4.50)

## 2018-05-19 LAB — HEMOGLOBIN A1C: HEMOGLOBIN A1C: 5.8 % (ref 4.6–6.5)

## 2018-05-19 LAB — VITAMIN D 25 HYDROXY (VIT D DEFICIENCY, FRACTURES): VITD: 38.15 ng/mL (ref 30.00–100.00)

## 2018-05-20 ENCOUNTER — Ambulatory Visit: Admit: 2018-05-20 | Discharge: 2018-05-21 | Payer: MEDICARE

## 2018-05-20 ENCOUNTER — Ambulatory Visit: Admit: 2018-05-20 | Discharge: 2018-05-21 | Payer: MEDICARE | Attending: Surgery | Primary: Surgery

## 2018-05-20 ENCOUNTER — Ambulatory Visit
Admit: 2018-05-20 | Discharge: 2018-05-21 | Payer: MEDICARE | Attending: Hematology & Oncology | Primary: Hematology & Oncology

## 2018-05-20 DIAGNOSIS — C3490 Malignant neoplasm of unspecified part of unspecified bronchus or lung: Principal | ICD-10-CM

## 2018-05-20 DIAGNOSIS — C3492 Malignant neoplasm of unspecified part of left bronchus or lung: Secondary | ICD-10-CM | POA: Diagnosis not present

## 2018-05-20 DIAGNOSIS — R9389 Abnormal findings on diagnostic imaging of other specified body structures: Secondary | ICD-10-CM | POA: Diagnosis not present

## 2018-05-20 DIAGNOSIS — C3412 Malignant neoplasm of upper lobe, left bronchus or lung: Secondary | ICD-10-CM | POA: Diagnosis not present

## 2018-05-20 NOTE — Telephone Encounter (Signed)
See other mychart msg

## 2018-05-21 ENCOUNTER — Other Ambulatory Visit: Payer: Self-pay | Admitting: Internal Medicine

## 2018-06-18 ENCOUNTER — Other Ambulatory Visit (INDEPENDENT_AMBULATORY_CARE_PROVIDER_SITE_OTHER): Payer: Medicare Other

## 2018-06-18 LAB — BASIC METABOLIC PANEL
BUN: 12 mg/dL (ref 6–23)
CHLORIDE: 102 meq/L (ref 96–112)
CO2: 28 meq/L (ref 19–32)
Calcium: 10.3 mg/dL (ref 8.4–10.5)
Creatinine, Ser: 1.01 mg/dL (ref 0.40–1.20)
GFR: 57.69 mL/min — ABNORMAL LOW (ref >60.00)
Glucose, Bld: 113 mg/dL — ABNORMAL HIGH (ref 70–99)
Potassium: 3.7 mEq/L (ref 3.5–5.1)
SODIUM: 140 meq/L (ref 135–145)

## 2018-06-19 ENCOUNTER — Ambulatory Visit: Admit: 2018-06-19 | Discharge: 2018-07-18 | Payer: MEDICARE | Attending: Audiologist | Primary: Audiologist

## 2018-06-19 ENCOUNTER — Ambulatory Visit: Admit: 2018-06-19 | Discharge: 2018-06-20 | Payer: MEDICARE | Attending: Otolaryngology | Primary: Otolaryngology

## 2018-06-19 DIAGNOSIS — R490 Dysphonia: Principal | ICD-10-CM

## 2018-06-19 DIAGNOSIS — J3800 Paralysis of vocal cords and larynx, unspecified: Secondary | ICD-10-CM | POA: Diagnosis not present

## 2018-06-19 LAB — CALCIUM, IONIZED: Calcium, Ion: 5.45 mg/dL (ref 4.8–5.6)

## 2018-06-24 MED ORDER — METOPROLOL SUCCINATE ER 25 MG PO TB24: 75.0000 mg | ORAL_TABLET | Freq: Every day | ORAL | 11 refills | Status: DC

## 2018-06-24 NOTE — Telephone Encounter (Signed)
I see in the note where you did tell the pt to increase metoprolol to 75mg  daily. Was going to send in a new rx but the metoprolol 75mg  is tartrate and the one she is currently prescribed is succinate.

## 2018-06-30 DIAGNOSIS — Z23 Encounter for immunization: Secondary | ICD-10-CM | POA: Diagnosis not present

## 2018-07-06 ENCOUNTER — Other Ambulatory Visit: Payer: Self-pay | Admitting: Internal Medicine

## 2018-08-11 ENCOUNTER — Encounter: Payer: Self-pay | Admitting: Internal Medicine

## 2018-08-11 ENCOUNTER — Ambulatory Visit (INDEPENDENT_AMBULATORY_CARE_PROVIDER_SITE_OTHER): Payer: Medicare Other | Admitting: Internal Medicine

## 2018-08-11 VITALS — BP 128/90 | HR 88 | Temp 98.6°F | Resp 16 | Ht 62.0 in | Wt 176.2 lb

## 2018-08-11 DIAGNOSIS — C3492 Malignant neoplasm of unspecified part of left bronchus or lung: Secondary | ICD-10-CM | POA: Diagnosis not present

## 2018-08-11 DIAGNOSIS — E785 Hyperlipidemia, unspecified: Secondary | ICD-10-CM | POA: Diagnosis not present

## 2018-08-11 DIAGNOSIS — I10 Essential (primary) hypertension: Secondary | ICD-10-CM | POA: Diagnosis not present

## 2018-08-11 DIAGNOSIS — F329 Major depressive disorder, single episode, unspecified: Secondary | ICD-10-CM

## 2018-08-11 DIAGNOSIS — F419 Anxiety disorder, unspecified: Secondary | ICD-10-CM | POA: Diagnosis not present

## 2018-08-11 DIAGNOSIS — R7303 Prediabetes: Secondary | ICD-10-CM

## 2018-08-11 DIAGNOSIS — R000 Tachycardia, unspecified: Secondary | ICD-10-CM

## 2018-08-11 DIAGNOSIS — E039 Hypothyroidism, unspecified: Secondary | ICD-10-CM | POA: Diagnosis not present

## 2018-08-11 MED ORDER — ZOSTER VAC RECOMB ADJUVANTED 50 MCG/0.5ML IM SUSR: 0.5000 mL | Freq: Once | INTRAMUSCULAR | 1 refills | Status: AC

## 2018-08-11 MED ORDER — BUPROPION HCL ER (XL) 300 MG PO TB24: 300.0000 mg | ORAL_TABLET | Freq: Every day | ORAL | 1 refills | Status: DC

## 2018-08-11 MED ORDER — METOPROLOL SUCCINATE ER 25 MG PO TB24: 75.0000 mg | ORAL_TABLET | Freq: Every day | ORAL | 3 refills | Status: DC

## 2018-08-11 MED ORDER — HYDROCHLOROTHIAZIDE 12.5 MG PO CAPS: 12.5000 mg | ORAL_CAPSULE | Freq: Every day | ORAL | 2 refills | Status: DC

## 2018-08-11 MED ORDER — AMOXICILLIN-POT CLAVULANATE 875-125 MG PO TABS: 1.0000 | ORAL_TABLET | Freq: Two times a day (BID) | ORAL | 0 refills | Status: DC

## 2018-08-11 NOTE — Progress Notes (Signed)
Subjective:  Patient ID: Stacy Santana, female    DOB: 1951/04/11  Age: 67 y.o. MRN: [id number]  CC: The primary encounter diagnosis was Anxiety and depression. Diagnoses of Acquired hypothyroidism, Prediabetes, Essential hypertension, Hyperlipidemia LDL goal <100, Adenocarcinoma of lung, stage 1, left ([place]), and Tachycardia were also pertinent to this visit.  HPI Stacy Santana presents for follow up on hypertension, prediabetes. depression , hypothyroidism, lung Cancer. .  S/p left upper lobectomy in 2017,  Mucinous adenoCA .  Treated with chemoradiation.  Local recurrence. Sept 2018 thoracotomy, wedge resection: clan margins.  Second Recurrence in May 2019 s/p pneumonectomy, complicated by  Left vocal cord paralysis and thin liquid dysphagia   Improved voice and swallow function post MDL  With thryroplasty Juy 2019  Last CT chest July 2019 unchanged RML nodule 0.5 cm    Repeat planned next week  .   Lab Results  Component Value Date   HGBA1C 5.8 05/19/2018    Feels ok except for rhinitis x 2 weeks.  Felt like a viral URI for 2 days   Going to NO Thanksgiving Week   Decreased energy  Some feelings of negativity,  Lack of concentration .  Anxious about upcoming CT   Outpatient Medications Prior to Visit  Medication Sig Dispense Refill  . azelastine (ASTELIN) 0.1 % nasal spray PLACE 1 SPRAY INTO BOTH NOSTRILS AT BEDTIME AS NEEDED 30 mL 6  . diazepam (VALIUM) 5 MG tablet Take 1 tablet (5 mg total) by mouth every 12 (twelve) hours as needed for anxiety. 30 tablet 0  . levothyroxine (SYNTHROID, LEVOTHROID) 50 MCG tablet TAKE 1 TABLET(50 MCG) BY MOUTH DAILY 90 tablet 1  . loratadine (CLARITIN) 10 MG tablet Take 10 mg by mouth daily.      [name] losartan (COZAAR) 100 MG tablet TAKE 1 TABLET(100 MG) BY MOUTH DAILY 90 tablet 1  . Multiple Vitamin (MULTIVITAMIN) tablet Take 1 tablet by mouth daily.    . Probiotic Product (PROBIOTIC DAILY PO) Take by mouth.    . triamcinolone (NASACORT) 55  MCG/ACT AERO nasal inhaler Place into the nose.    [name] buPROPion (WELLBUTRIN XL) 150 MG 24 hr tablet Take 1 tablet (150 mg total) by mouth daily. 90 tablet 1  . metoprolol succinate (TOPROL-XL) 25 MG 24 hr tablet Take 3 tablets (75 mg total) by mouth daily. Take with or immediately following a meal. 90 tablet 11  . albuterol (PROVENTIL HFA;VENTOLIN HFA) 108 (90 Base) MCG/ACT inhaler Inhale into the lungs.    . Cholecalciferol (VITAMIN D) 400 UNIT/ML LIQD Take by mouth.    . fluconazole (DIFLUCAN) 150 MG tablet Take 1 tablet (150 mg total) by mouth daily. (Patient not taking: Reported on 08/11/2018) 2 tablet 0  . gabapentin (NEURONTIN) 300 MG capsule Take 300 mg by mouth 3 (three) times daily.    . meloxicam (MOBIC) 15 MG tablet Take 1 tablet (15 mg total) by mouth daily. (Patient not taking: Reported on 08/11/2018) 30 tablet 5  . pantoprazole (PROTONIX) 40 MG tablet   0   No facility-administered medications prior to visit.     Review of Systems;  Patient denies headache, fevers, malaise, unintentional weight loss, skin rash, eye pain, sinus congestion and sinus pain, sore throat, dysphagia,  hemoptysis , cough, dyspnea, wheezing, chest pain, palpitations, orthopnea, edema, abdominal pain, nausea, melena, diarrhea, constipation, flank pain, dysuria, hematuria, urinary  Frequency, nocturia, numbness, tingling, seizures,  Focal weakness, Loss of consciousness,  Tremor, insomnia, depression, anxiety, and suicidal  ideation.      Objective:  BP 128/90 (BP Location: Left Arm, Patient Position: Sitting, Cuff Size: Large)   Pulse 88   Temp 98.6 F (37 C) (Oral)   Resp 16   Ht 5\' 2"  (1.575 m)   Wt 176 lb 3.2 oz (79.9 kg)   SpO2 97%   BMI 32.23 kg/m   BP Readings from Last 3 Encounters:  08/11/18 128/90  05/09/18 (!) 130/92  02/20/18 136/78    Wt Readings from Last 3 Encounters:  08/11/18 176 lb 3.2 oz (79.9 kg)  05/09/18 177 lb 9.6 oz (80.6 kg)  02/20/18 179 lb 9.6 oz (81.5 kg)     General appearance: alert, cooperative and appears stated age Ears: normal TM's and external ear canals both ears Throat: lips, mucosa, and tongue normal; teeth and gums normal Neck: no adenopathy, no carotid bruit, supple, symmetrical, trachea midline and thyroid not enlarged, symmetric, no tenderness/mass/nodules Back: symmetric, no curvature. ROM normal. No CVA tenderness. Lungs: clear to auscultation bilaterally Heart: regular rate and rhythm, S1, S2 normal, no murmur, click, rub or gallop Abdomen: soft, non-tender; bowel sounds normal; no masses,  no organomegaly Pulses: 2+ and symmetric Skin: Skin color, texture, turgor normal. No rashes or lesions Lymph nodes: Cervical, supraclavicular, and axillary nodes normal. Psych: affect normal, makes good eye contact. No fidgeting,  Smiles easily.  Denies suicidal thoughts   Lab Results  Component Value Date   HGBA1C 5.8 05/19/2018   HGBA1C 5.0 11/05/2016    Lab Results  Component Value Date   CREATININE 1.01 06/18/2018   CREATININE 1.09 05/19/2018   CREATININE 1.10 (H) 02/26/2018    Lab Results  Component Value Date   WBC 6.3 05/19/2018   HGB 13.0 05/19/2018   HCT 39.3 05/19/2018   PLT 306.0 05/19/2018   GLUCOSE 113 (H) 06/18/2018   CHOL 190 05/19/2018   TRIG 182.0 (H) 05/19/2018   HDL 46.80 05/19/2018   LDLDIRECT 91.0 11/05/2016   LDLCALC 107 (H) 05/19/2018   ALT 11 05/19/2018   AST 11 05/19/2018   NA 140 06/18/2018   K 3.7 06/18/2018   CL 102 06/18/2018   CREATININE 1.01 06/18/2018   BUN 12 06/18/2018   CO2 28 06/18/2018   TSH 2.82 05/19/2018   HGBA1C 5.8 05/19/2018   MICROALBUR 1.2 11/05/2016      Assessment & Plan:   Problem List Items Addressed This Visit    Adenocarcinoma of lung, stage 1, left ([place])    With 2 recurrences.  Now s/p left pneumonectomy.  CT planned for 3 month follow up      Relevant Medications   amoxicillin-clavulanate (AUGMENTIN) 875-125 MG tablet   Anxiety and depression -  Primary    Expressing several negative symptoms,  But not suicidal,  Aggravated by health issues.  Increase wellbutrin dose to 300 mg daily .  Prn use of valium for anxiety       Relevant Medications   buPROPion (WELLBUTRIN XL) 300 MG 24 hr tablet   Hypertension (Chronic)    Not at goal.  Adding hctz 12.5 mg daily       Relevant Medications   metoprolol succinate (TOPROL-XL) 25 MG 24 hr tablet   hydrochlorothiazide (MICROZIDE) 12.5 MG capsule   Other Relevant Orders   Comprehensive metabolic panel   Microalbumin / creatinine urine ratio   Hypothyroidism   Relevant Medications   metoprolol succinate (TOPROL-XL) 25 MG 24 hr tablet   Other Relevant Orders   TSH   Prediabetes  Her random glucose was elevated but not diagnostic of diabetes .  I recommend she follow a low glycemic index diet and particpate regularly in an aerobic  exercise activity.    Lab Results  Component Value Date   HGBA1C 5.8 05/19/2018         Relevant Orders   Hemoglobin A1c   Tachycardia    Continue metoprolol, dose of 75 mg ,       Other Visit Diagnoses    Hyperlipidemia LDL goal <100       Relevant Medications   metoprolol succinate (TOPROL-XL) 25 MG 24 hr tablet   hydrochlorothiazide (MICROZIDE) 12.5 MG capsule   Other Relevant Orders   Lipid panel     A total of 25 minutes of face to face time was spent with patient more than half of which was spent in counselling about the above mentioned conditions  and coordination of care   I have discontinued Lannette Donath. Wint's albuterol, Vitamin D, gabapentin, meloxicam, pantoprazole, and fluconazole. I have also changed her buPROPion. Additionally, I am having her start on amoxicillin-clavulanate, Zoster Vaccine Adjuvanted, and hydrochlorothiazide. Lastly, I am having her maintain her loratadine, multivitamin, Probiotic Product (PROBIOTIC DAILY PO), triamcinolone, azelastine, levothyroxine, diazepam, losartan, and metoprolol succinate.  Meds ordered  this encounter  Medications  . metoprolol succinate (TOPROL-XL) 25 MG 24 hr tablet    Sig: Take 3 tablets (75 mg total) by mouth daily. Take with or immediately following a meal.    Dispense:  270 tablet    Refill:  3  . amoxicillin-clavulanate (AUGMENTIN) 875-125 MG tablet    Sig: Take 1 tablet by mouth 2 (two) times daily.    Dispense:  14 tablet    Refill:  0  . Zoster Vaccine Adjuvanted [facility]) injection    Sig: Inject 0.5 mLs into the muscle once for 1 dose.    Dispense:  1 each    Refill:  1  . buPROPion (WELLBUTRIN XL) 300 MG 24 hr tablet    Sig: Take 1 tablet (300 mg total) by mouth daily.    Dispense:  90 tablet    Refill:  1  . hydrochlorothiazide (MICROZIDE) 12.5 MG capsule    Sig: Take 1 capsule (12.5 mg total) by mouth daily.    Dispense:  30 capsule    Refill:  2    Medications Discontinued During This Encounter  Medication Reason  . albuterol (PROVENTIL HFA;VENTOLIN HFA) 108 (90 Base) MCG/ACT inhaler Error  . Cholecalciferol (VITAMIN D) 400 UNIT/ML LIQD Error  . fluconazole (DIFLUCAN) 150 MG tablet Error  . gabapentin (NEURONTIN) 300 MG capsule Error  . meloxicam (MOBIC) 15 MG tablet Error  . pantoprazole (PROTONIX) 40 MG tablet Error  . metoprolol succinate (TOPROL-XL) 25 MG 24 hr tablet   . buPROPion (WELLBUTRIN XL) 150 MG 24 hr tablet     Follow-up: Return in about 3 months (around 11/11/2018).   Crecencio Mc, MD

## 2018-08-11 NOTE — Patient Instructions (Signed)
We increased the wellbutrin dose to 300 mg daily  We added 12.5 mg hctz for blood pressure   fastings labs on or after Nov 18th

## 2018-08-12 DIAGNOSIS — R7303 Prediabetes: Secondary | ICD-10-CM | POA: Insufficient documentation

## 2018-08-12 NOTE — Assessment & Plan Note (Signed)
Her random glucose was elevated but not diagnostic of diabetes .  I recommend she follow a low glycemic index diet and particpate regularly in an aerobic  exercise activity.    Lab Results  Component Value Date   HGBA1C 5.8 05/19/2018

## 2018-08-12 NOTE — Assessment & Plan Note (Signed)
With 2 recurrences.  Now s/p left pneumonectomy.  CT planned for 3 month follow up

## 2018-08-12 NOTE — Assessment & Plan Note (Addendum)
Expressing several negative symptoms,  But not suicidal,  Aggravated by health issues.  Increase wellbutrin dose to 300 mg daily .  Prn use of valium for anxiety

## 2018-08-12 NOTE — Assessment & Plan Note (Signed)
Continue metoprolol, dose of 75 mg ,

## 2018-08-12 NOTE — Assessment & Plan Note (Signed)
Not at goal.  Adding hctz 12.5 mg daily

## 2018-08-19 ENCOUNTER — Ambulatory Visit
Admit: 2018-08-19 | Discharge: 2018-08-19 | Payer: MEDICARE | Attending: Hematology & Oncology | Primary: Hematology & Oncology

## 2018-08-19 ENCOUNTER — Ambulatory Visit: Admit: 2018-08-19 | Discharge: 2018-08-19 | Payer: MEDICARE

## 2018-08-19 ENCOUNTER — Ambulatory Visit: Admit: 2018-08-19 | Discharge: 2018-08-19 | Payer: MEDICARE | Attending: Surgery | Primary: Surgery

## 2018-08-19 DIAGNOSIS — C3492 Malignant neoplasm of unspecified part of left bronchus or lung: Principal | ICD-10-CM

## 2018-08-19 DIAGNOSIS — Z902 Acquired absence of lung [part of]: Secondary | ICD-10-CM | POA: Diagnosis not present

## 2018-08-19 DIAGNOSIS — Z87891 Personal history of nicotine dependence: Secondary | ICD-10-CM | POA: Diagnosis not present

## 2018-08-19 DIAGNOSIS — Z79899 Other long term (current) drug therapy: Secondary | ICD-10-CM | POA: Diagnosis not present

## 2018-08-19 DIAGNOSIS — I10 Essential (primary) hypertension: Secondary | ICD-10-CM | POA: Diagnosis not present

## 2018-08-19 DIAGNOSIS — E039 Hypothyroidism, unspecified: Secondary | ICD-10-CM | POA: Diagnosis not present

## 2018-08-19 DIAGNOSIS — R918 Other nonspecific abnormal finding of lung field: Secondary | ICD-10-CM | POA: Diagnosis not present

## 2018-08-19 DIAGNOSIS — M1990 Unspecified osteoarthritis, unspecified site: Secondary | ICD-10-CM | POA: Diagnosis not present

## 2018-08-22 ENCOUNTER — Other Ambulatory Visit: Payer: Self-pay | Admitting: Internal Medicine

## 2018-08-22 MED ORDER — CLONAZEPAM 0.5 MG PO TABS: 0.5000 mg | ORAL_TABLET | Freq: Two times a day (BID) | ORAL | 5 refills | Status: DC | PRN

## 2018-09-09 ENCOUNTER — Other Ambulatory Visit (INDEPENDENT_AMBULATORY_CARE_PROVIDER_SITE_OTHER): Payer: Medicare Other

## 2018-09-09 DIAGNOSIS — E039 Hypothyroidism, unspecified: Secondary | ICD-10-CM | POA: Diagnosis not present

## 2018-09-09 DIAGNOSIS — I10 Essential (primary) hypertension: Secondary | ICD-10-CM

## 2018-09-09 DIAGNOSIS — E785 Hyperlipidemia, unspecified: Secondary | ICD-10-CM

## 2018-09-09 DIAGNOSIS — R7303 Prediabetes: Secondary | ICD-10-CM

## 2018-09-09 LAB — COMPREHENSIVE METABOLIC PANEL
ALBUMIN: 4.5 g/dL (ref 3.5–5.2)
ALK PHOS: 89 U/L (ref 39–117)
ALT: 11 U/L (ref 0–35)
AST: 17 U/L (ref 0–37)
BUN: 19 mg/dL (ref 6–23)
CHLORIDE: 102 meq/L (ref 96–112)
CO2: 30 mEq/L (ref 19–32)
Calcium: 10.2 mg/dL (ref 8.4–10.5)
Creatinine, Ser: 1.15 mg/dL (ref 0.40–1.20)
GFR: 49.63 mL/min — ABNORMAL LOW (ref >60.00)
Glucose, Bld: 102 mg/dL — ABNORMAL HIGH (ref 70–99)
POTASSIUM: 4.5 meq/L (ref 3.5–5.1)
SODIUM: 139 meq/L (ref 135–145)
TOTAL PROTEIN: 7.4 g/dL (ref 6.0–8.3)
Total Bilirubin: 0.4 mg/dL (ref 0.2–1.2)

## 2018-09-09 LAB — TSH: TSH: 2.58 u[IU]/mL (ref 0.35–4.50)

## 2018-09-09 LAB — LIPID PANEL
CHOLESTEROL: 162 mg/dL (ref 0–200)
HDL: 43.4 mg/dL (ref >39.00)
LDL CALC: 91 mg/dL (ref 0–99)
NonHDL: 118.76
TRIGLYCERIDES: 138 mg/dL (ref 0.0–149.0)
Total CHOL/HDL Ratio: 4
VLDL: 27.6 mg/dL (ref 0.0–40.0)

## 2018-09-09 LAB — MICROALBUMIN / CREATININE URINE RATIO
CREATININE, U: 147.2 mg/dL
MICROALB UR: 3.4 mg/dL — AB (ref 0.0–1.9)
Microalb Creat Ratio: 2.3 mg/g (ref 0.0–30.0)

## 2018-09-09 LAB — HEMOGLOBIN A1C: HEMOGLOBIN A1C: 5.7 % (ref 4.6–6.5)

## 2018-09-15 ENCOUNTER — Ambulatory Visit: Admit: 2018-09-15 | Discharge: 2018-09-15 | Payer: MEDICARE

## 2018-09-15 DIAGNOSIS — C3490 Malignant neoplasm of unspecified part of unspecified bronchus or lung: Secondary | ICD-10-CM | POA: Diagnosis not present

## 2018-09-15 DIAGNOSIS — R918 Other nonspecific abnormal finding of lung field: Secondary | ICD-10-CM | POA: Diagnosis not present

## 2018-09-15 DIAGNOSIS — C3492 Malignant neoplasm of unspecified part of left bronchus or lung: Secondary | ICD-10-CM

## 2018-09-16 ENCOUNTER — Ambulatory Visit
Admit: 2018-09-16 | Discharge: 2018-09-16 | Payer: MEDICARE | Attending: Radiation Oncology | Primary: Radiation Oncology

## 2018-09-16 ENCOUNTER — Ambulatory Visit
Admit: 2018-09-16 | Discharge: 2018-09-16 | Payer: MEDICARE | Attending: Hematology & Oncology | Primary: Hematology & Oncology

## 2018-09-16 ENCOUNTER — Ambulatory Visit: Admit: 2018-09-16 | Discharge: 2018-09-16 | Payer: MEDICARE | Attending: Surgery | Primary: Surgery

## 2018-09-16 DIAGNOSIS — C3490 Malignant neoplasm of unspecified part of unspecified bronchus or lung: Secondary | ICD-10-CM | POA: Diagnosis not present

## 2018-09-16 DIAGNOSIS — C3482 Malignant neoplasm of overlapping sites of left bronchus and lung: Secondary | ICD-10-CM | POA: Diagnosis not present

## 2018-09-18 ENCOUNTER — Encounter
Admit: 2018-09-18 | Discharge: 2018-09-19 | Payer: MEDICARE | Attending: Hematology & Oncology | Primary: Hematology & Oncology

## 2018-09-18 ENCOUNTER — Ambulatory Visit: Admit: 2018-09-18 | Discharge: 2018-09-19 | Payer: MEDICARE | Attending: Otolaryngology | Primary: Otolaryngology

## 2018-09-18 DIAGNOSIS — J3800 Paralysis of vocal cords and larynx, unspecified: Secondary | ICD-10-CM | POA: Diagnosis not present

## 2018-10-07 ENCOUNTER — Ambulatory Visit: Admit: 2018-10-07 | Discharge: 2018-10-08 | Payer: MEDICARE | Attending: Adult Health | Primary: Adult Health

## 2018-10-07 DIAGNOSIS — C3490 Malignant neoplasm of unspecified part of unspecified bronchus or lung: Secondary | ICD-10-CM | POA: Diagnosis not present

## 2018-10-07 MED ORDER — NERATINIB 40 MG TABLET
ORAL_TABLET | Freq: Every day | ORAL | 11 refills | 0.00000 days | Status: CP
Start: 2018-10-07 — End: 2018-11-20

## 2018-10-08 ENCOUNTER — Encounter: Payer: Self-pay | Admitting: *Deleted

## 2018-10-08 ENCOUNTER — Telehealth: Payer: Self-pay | Admitting: *Deleted

## 2018-10-08 ENCOUNTER — Ambulatory Visit: Admit: 2018-10-08 | Discharge: 2018-10-09 | Payer: MEDICARE

## 2018-10-08 DIAGNOSIS — C3490 Malignant neoplasm of unspecified part of unspecified bronchus or lung: Secondary | ICD-10-CM | POA: Diagnosis not present

## 2018-10-08 LAB — COMPREHENSIVE METABOLIC PANEL
ALBUMIN: 5 g/dL (ref 3.5–5.0)
ALKALINE PHOSPHATASE: 88 U/L (ref 38–126)
ALT (SGPT): 17 U/L (ref <35)
ANION GAP: 11 mmol/L (ref 7–15)
AST (SGOT): 25 U/L (ref 14–38)
BILIRUBIN TOTAL: 0.3 mg/dL (ref 0.0–1.2)
BUN / CREAT RATIO: 13
CALCIUM: 10.3 mg/dL — ABNORMAL HIGH (ref 8.5–10.2)
CHLORIDE: 102 mmol/L (ref 98–107)
CO2: 28 mmol/L (ref 22.0–30.0)
CREATININE: 0.89 mg/dL (ref 0.60–1.00)
EGFR CKD-EPI AA FEMALE: 76 mL/min/{1.73_m2} (ref >=60)
EGFR CKD-EPI NON-AA FEMALE: 66 mL/min/{1.73_m2} (ref >=60)
GLUCOSE RANDOM: 94 mg/dL (ref 65–179)
POTASSIUM: 4 mmol/L (ref 3.5–5.0)
PROTEIN TOTAL: 7.7 g/dL (ref 6.5–8.3)
SODIUM: 141 mmol/L (ref 135–145)

## 2018-10-08 LAB — CBC W/ AUTO DIFF
BASOPHILS ABSOLUTE COUNT: 0.1 10*9/L (ref 0.0–0.1)
BASOPHILS RELATIVE PERCENT: 1.2 %
EOSINOPHILS ABSOLUTE COUNT: 0.2 10*9/L (ref 0.0–0.4)
EOSINOPHILS RELATIVE PERCENT: 3.5 %
HEMATOCRIT: 41.7 % (ref 36.0–46.0)
HEMOGLOBIN: 14 g/dL (ref 13.5–16.0)
LARGE UNSTAINED CELLS: 2 % (ref 0–4)
LYMPHOCYTES ABSOLUTE COUNT: 0.8 10*9/L — ABNORMAL LOW (ref 1.5–5.0)
LYMPHOCYTES RELATIVE PERCENT: 12.4 %
MEAN CORPUSCULAR HEMOGLOBIN CONC: 33.6 g/dL (ref 31.0–37.0)
MEAN CORPUSCULAR HEMOGLOBIN: 29.6 pg (ref 26.0–34.0)
MEAN CORPUSCULAR VOLUME: 87.9 fL (ref 80.0–100.0)
MEAN PLATELET VOLUME: 7 fL (ref 7.0–10.0)
NEUTROPHILS ABSOLUTE COUNT: 4.5 10*9/L (ref 2.0–7.5)
NEUTROPHILS RELATIVE PERCENT: 75.4 %
PLATELET COUNT: 320 10*9/L (ref 150–440)
RED BLOOD CELL COUNT: 4.74 10*12/L (ref 4.00–5.20)
RED CELL DISTRIBUTION WIDTH: 14.3 % (ref 12.0–15.0)
WBC ADJUSTED: 6 10*9/L (ref 4.5–11.0)

## 2018-10-08 LAB — ALT (SGPT): Alanine aminotransferase:CCnc:Pt:Ser/Plas:Qn:: 17

## 2018-10-08 LAB — HEMOGLOBIN: Lab: 14

## 2018-10-08 NOTE — Telephone Encounter (Signed)
Copied from [facility] [id number]. Topic: Appointment Scheduling - Scheduling Inquiry for Clinic >> Oct 08, 2018 10:33 AM Reyne Dumas L wrote: Reason for CRM:   Pt has had a change in her cancer status - reoccurrence.  Pt states that a change to her maintance medication has been requested and she needs an appointment with Dr. Derrel Nip as soon as possible.  First I could offer was in February.  Pt wants to know if she can be seen sooner. Pt can be reached at [phone]

## 2018-10-08 NOTE — Telephone Encounter (Signed)
Scheduled

## 2018-10-08 NOTE — Telephone Encounter (Signed)
Tried to call patient several times the phone picks up but no response, sent my chart message appointment available 10/13/18 at 8 am held for patient FYI.

## 2018-10-13 ENCOUNTER — Encounter: Payer: Self-pay | Admitting: Internal Medicine

## 2018-10-13 ENCOUNTER — Ambulatory Visit (INDEPENDENT_AMBULATORY_CARE_PROVIDER_SITE_OTHER): Payer: Medicare Other | Admitting: Internal Medicine

## 2018-10-13 DIAGNOSIS — F419 Anxiety disorder, unspecified: Secondary | ICD-10-CM

## 2018-10-13 DIAGNOSIS — F329 Major depressive disorder, single episode, unspecified: Secondary | ICD-10-CM

## 2018-10-13 DIAGNOSIS — F32A Depression, unspecified: Secondary | ICD-10-CM

## 2018-10-13 DIAGNOSIS — C3492 Malignant neoplasm of unspecified part of left bronchus or lung: Secondary | ICD-10-CM | POA: Diagnosis not present

## 2018-10-13 MED ORDER — PREDNISONE 10 MG PO TABS: ORAL_TABLET | ORAL | 0 refills | Status: DC

## 2018-10-13 MED ORDER — CITALOPRAM HYDROBROMIDE 20 MG PO TABS: 20.0000 mg | ORAL_TABLET | Freq: Every day | ORAL | 0 refills | Status: DC

## 2018-10-13 MED ORDER — AMOXICILLIN-POT CLAVULANATE 875-125 MG PO TABS: 1.0000 | ORAL_TABLET | Freq: Two times a day (BID) | ORAL | 0 refills | Status: DC

## 2018-10-13 MED ORDER — OSELTAMIVIR PHOSPHATE 75 MG PO CAPS: 75.0000 mg | ORAL_CAPSULE | Freq: Every day | ORAL | 0 refills | Status: DC

## 2018-10-13 NOTE — Unmapped (Signed)
Outpatient Sw Note - Telephone Call    Sw received a VM from the patient stating she had not yet heard about an appt with a [facility] therapist.  When Sw called back, patient had already received a call from the [facility] scheduler with an appt day/time to see [facility].    Patient questioned if she would get billed for this visit with Pam and Sw explained that she would not.    Sherran Needs, MSW, LCSW

## 2018-10-13 NOTE — Patient Instructions (Addendum)
Continue wellbutrin  150  Mg  For one week, then every other for one week ,  Then stop    Start celexa at half tablet with dinner for 4 days.     If no nausea , increase to a full  tablet.   You Can increase the celexa to 40 mg after 2 weeks if the  20 mg dose if needed   Ok to use the clonazepam 1/2 tablet daily if needed ,  Other option is alprazolam XR if 0.25 mg clonazepam is too sedating    Start the tamiflu if you have a  flu exposure  increase dose to twice daily if you develop flu symptoms and call for refill

## 2018-10-13 NOTE — Progress Notes (Signed)
Subjective:  Patient ID: Stacy Santana, female    DOB: July 12, 1951  Age: 68 y.o. MRN: [id number]  CC: Diagnoses of Adenocarcinoma of lung, stage 1, left ([facility]) and Anxiety and depression were pertinent to this visit.  HPI Stacy Santana presents for follow up on depression/anxiety aggravated by her  Recent discovery of new metastatic lesions involving her right lung.  She has been unable to concentrate on anything but finding a clinical trial since her new oncologist has not  Been forthcoming or overly helpful.  She is having a difficult time remaining calm and is frustrated by her new inability to argue without becoming tearful.   She reduced wellbutrin to 150 last week .  Her anxiety has improved slightly,  But is still not manageable  She was treated in the past with celexa ,  Which she recalls made her "care less" about things and requests a trial of it now. She has availed herself to the psychiatry services offered by the cancer center and had an appointment Jan 16    Outpatient Medications Prior to Visit  Medication Sig Dispense Refill  . azelastine (ASTELIN) 0.1 % nasal spray PLACE 1 SPRAY INTO BOTH NOSTRILS AT BEDTIME AS NEEDED 30 mL 6  . buPROPion (WELLBUTRIN XL) 300 MG 24 hr tablet Take 1 tablet (300 mg total) by mouth daily. (Patient taking differently: Take 300 mg by mouth daily. ) 90 tablet 1  . clonazePAM (KLONOPIN) 0.5 MG tablet Take 1 tablet (0.5 mg total) by mouth 2 (two) times daily as needed for anxiety. 60 tablet 5  . diazepam (VALIUM) 5 MG tablet Take 1 tablet (5 mg total) by mouth every 12 (twelve) hours as needed for anxiety. 30 tablet 0  . levothyroxine (SYNTHROID, LEVOTHROID) 50 MCG tablet TAKE 1 TABLET(50 MCG) BY MOUTH DAILY 90 tablet 1  . loratadine (CLARITIN) 10 MG tablet Take 10 mg by mouth daily.      [name] losartan (COZAAR) 100 MG tablet TAKE 1 TABLET(100 MG) BY MOUTH DAILY 90 tablet 1  . metoprolol succinate (TOPROL-XL) 25 MG 24 hr tablet Take 3 tablets (75 mg  total) by mouth daily. Take with or immediately following a meal. 270 tablet 3  . Multiple Vitamin (MULTIVITAMIN) tablet Take 1 tablet by mouth daily.    [name] triamcinolone (NASACORT) 55 MCG/ACT AERO nasal inhaler Place into the nose.    [name] amoxicillin-clavulanate (AUGMENTIN) 875-125 MG tablet Take 1 tablet by mouth 2 (two) times daily. 14 tablet 0  . hydrochlorothiazide (MICROZIDE) 12.5 MG capsule Take 1 capsule (12.5 mg total) by mouth daily. (Patient not taking: Reported on 10/13/2018) 30 capsule 2  . Probiotic Product (PROBIOTIC DAILY PO) Take by mouth.     No facility-administered medications prior to visit.     Review of Systems;  Patient denies headache, fevers, malaise, unintentional weight loss, skin rash, eye pain, sinus congestion and sinus pain, sore throat, dysphagia,  hemoptysis , cough, dyspnea, wheezing, chest pain, palpitations, orthopnea, edema, abdominal pain, nausea, melena, diarrhea, constipation, flank pain, dysuria, hematuria, urinary  Frequency, nocturia, numbness, tingling, seizures,  Focal weakness, Loss of consciousness,  Tremor, insomnia, and suicidal ideation.      Objective:  BP (!) 144/88 (BP Location: Left Arm, Patient Position: Sitting, Cuff Size: Normal)   Pulse 86   Temp 98.3 F (36.8 C) (Oral)   Resp 15   Ht 5\' 2"  (1.575 m)   Wt 174 lb (78.9 kg)   SpO2 98%   BMI 31.83  kg/m   BP Readings from Last 3 Encounters:  10/13/18 (!) 144/88  08/11/18 128/90  05/09/18 (!) 130/92    Wt Readings from Last 3 Encounters:  10/13/18 174 lb (78.9 kg)  08/11/18 176 lb 3.2 oz (79.9 kg)  05/09/18 177 lb 9.6 oz (80.6 kg)    General appearance: alert, cooperative and appears stated age Lungs: clear to auscultation on right .  Decreased BS on left.  Heart: regular rate and rhythm, S1, S2 normal, no murmur, click, rub or gallop Abdomen: soft, non-tender; bowel sounds normal; no masses,  no organomegaly Pulses: 2+ and symmetric Skin: Skin color, texture, turgor  normal. No rashes or lesions Lymph nodes: Cervical, supraclavicular, and axillary nodes normal.  Lab Results  Component Value Date   HGBA1C 5.7 09/09/2018   HGBA1C 5.8 05/19/2018   HGBA1C 5.0 11/05/2016    Lab Results  Component Value Date   CREATININE 1.15 09/09/2018   CREATININE 1.01 06/18/2018   CREATININE 1.09 05/19/2018    Lab Results  Component Value Date   WBC 6.3 05/19/2018   HGB 13.0 05/19/2018   HCT 39.3 05/19/2018   PLT 306.0 05/19/2018   GLUCOSE 102 (H) 09/09/2018   CHOL 162 09/09/2018   TRIG 138.0 09/09/2018   HDL 43.40 09/09/2018   LDLDIRECT 91.0 11/05/2016   LDLCALC 91 09/09/2018   ALT 11 09/09/2018   AST 17 09/09/2018   NA 139 09/09/2018   K 4.5 09/09/2018   CL 102 09/09/2018   CREATININE 1.15 09/09/2018   BUN 19 09/09/2018   CO2 30 09/09/2018   TSH 2.58 09/09/2018   HGBA1C 5.7 09/09/2018   MICROALBUR 3.4 (H) 09/09/2018     Assessment & Plan:   Problem List Items Addressed This Visit    Adenocarcinoma of lung, stage 1, left ([place])     s/p left pneumonectomy.  CT done at  3 month follow up now showing new lesions in the right lung.  She is asymptomatic and very invested in finding a clinical trial that will offer her treatment.        Relevant Medications   predniSONE (DELTASONE) 10 MG tablet   amoxicillin-clavulanate (AUGMENTIN) 875-125 MG tablet   oseltamivir (TAMIFLU) 75 MG capsule   Anxiety and depression    Medication changes discussed:  Continue wellbutrin 150 mg once aily of 1 weeks,  Then every other day for one week ,  htne stop Starting citalopram 10 mg daily for 3 days  And increase to full tablet (20 mg ) if no nausea is experienced.  After two weeks at 20 mg ,  She is authoriized to increase her dose to 40 mg if she is not feeling significant relief.       Relevant Medications   citalopram (CELEXA) 20 MG tablet      I have discontinued Lannette Donath. Braddock's Probiotic Product (PROBIOTIC DAILY PO), amoxicillin-clavulanate, and  hydrochlorothiazide. I am also having her start on predniSONE, amoxicillin-clavulanate, citalopram, and oseltamivir. Additionally, I am having her maintain her loratadine, multivitamin, triamcinolone, azelastine, levothyroxine, diazepam, losartan, metoprolol succinate, buPROPion, and clonazePAM.  Meds ordered this encounter  Medications  . predniSONE (DELTASONE) 10 MG tablet    Sig: 6 tablets on Day 1 , then reduce by 1 tablet daily until gone    Dispense:  21 tablet    Refill:  0  . amoxicillin-clavulanate (AUGMENTIN) 875-125 MG tablet    Sig: Take 1 tablet by mouth 2 (two) times daily.    Dispense:  14 tablet  Refill:  0  . citalopram (CELEXA) 20 MG tablet    Sig: Take 1 tablet (20 mg total) by mouth daily.    Dispense:  90 tablet    Refill:  0  . oseltamivir (TAMIFLU) 75 MG capsule    Sig: Take 1 capsule (75 mg total) by mouth daily.    Dispense:  10 capsule    Refill:  0    Medications Discontinued During This Encounter  Medication Reason  . amoxicillin-clavulanate (AUGMENTIN) 875-125 MG tablet Completed Course  . hydrochlorothiazide (MICROZIDE) 12.5 MG capsule Patient has not taken in last 30 days  . Probiotic Product (PROBIOTIC DAILY PO) Patient has not taken in last 30 days    Follow-up: No follow-ups on file.   Crecencio Mc, MD

## 2018-10-14 NOTE — Assessment & Plan Note (Signed)
s/p left pneumonectomy.  CT done at  3 month follow up now showing new lesions in the right lung.  She is asymptomatic and very invested in finding a clinical trial that will offer her treatment.

## 2018-10-14 NOTE — Assessment & Plan Note (Signed)
Medication changes discussed:  Continue wellbutrin 150 mg once aily of 1 weeks,  Then every other day for one week ,  htne stop Starting citalopram 10 mg daily for 3 days  And increase to full tablet (20 mg ) if no nausea is experienced.  After two weeks at 20 mg ,  She is authoriized to increase her dose to 40 mg if she is not feeling significant relief.

## 2018-10-16 ENCOUNTER — Ambulatory Visit: Admit: 2018-10-16 | Discharge: 2018-10-17 | Payer: MEDICARE

## 2018-10-16 DIAGNOSIS — Z9189 Other specified personal risk factors, not elsewhere classified: Principal | ICD-10-CM

## 2018-10-16 NOTE — Unmapped (Signed)
[facility]   Comprehensive Cancer Support Program       THIS IS A [name]      Service Date: October 16, 2018    Service requesting consult:  Oncology Outpatient SW    Requesting Provider:  Sherran Needs, LCSW    Location of patient:  [facility]    Consulting Provider:  Perry Mount, [place]    Reason for contact:  Coping with diagnosis of stage IV disease      Krista Pratt is a 68 yo woman with stage IV NSCLC initially diagnosed in 03/2016. Pt is s/p lobectomy LUL and mediastinal LND in 03/2016 and adjuvant chemotherapy and XRT completed in 10/2016. Local regional recurrence found in 03/2017, s/p resection in 06/2017. Imaging in 11/2017 concerning for recurrence and FNA in 03/2018 revealed atypical epithelial cells, s/p pneumonectomy in 01/2018. In 08/2018 routine surveillance imaging detected new right lung pulmonary nodules concerning for metastatic disease versus possible pneumonitis / pneumonia process.  Following course of antibiotics, 09/16/2018 repeat PET CT showed increase in size in multiple right lung nodules concerning for metastatic disease. At that time, plan was for pt to participate in clinical trial with tumor injection followed by radiation and immunotherapy with a medication called nivolumab. However, it was subsequently agreed by providers that nanoparticle injection either via bronchoscopy or percutaneous route would be too risky for Krista Pratt given her prior left pneumonectomy. It is now planned to proceed with neratinib therapy. Initiating treatment has been delayed since pt???s insurance recently changed and provider requires thorough documentation for off-label therapy. Krista Pratt has follow-up appointment scheduled with Dr. Janann August on 11/18/2018. She was seen today by [facility] for supportive counseling at the request of oncology outpatient SW. She was accompanied to the appointment by her husband, who was present for the interview.    Krista Pratt reported long h/o of depression for which she has taken Wellbutrin managed by her PCP. She had begun to feel that the Wellbutrin might be contributing to an increase in anxiety and she is following her PCP???s instructions in transitioning from Wellbutrin to Celexa. She is also prescribed clonazepam for anxiety. There were no concerns for pt safety identified during the interview.    Pt reported feeling frustrated and anxious at waiting months from the time of learning of the presence of disease in her R lung to initiating, ???especially when survival is measured in months.??? She stated she wants to be able to talk with her husband and adult children about her thoughts and feelings but has been unable to do so because she becomes tearful and cannot talk when she tries to talk about it. Therefore, instead to sharing her feelings, she ???focuses inward??? and isolates herself so she feels alone in her situation.     Provided active listening and normalized pt's thoughts and feelings in her present circumstance. Provided supportive counseling.    Krista Pratt was offered ongoing supportive counseling and a follow-up appointment was scheduled for 1/23. She was provided my contact information.     Time spent: 50 minutes    Perry Mount, [place]  Counselor  Comprehensive Cancer Support Program  [phone]    THIS IS A NON-BILLABLE ENCOUNTER

## 2018-10-17 MED ORDER — ONDANSETRON HCL 8 MG TABLET
ORAL_TABLET | Freq: Three times a day (TID) | ORAL | 2 refills | 0.00000 days | Status: CP | PRN
Start: 2018-10-17 — End: 2019-10-17

## 2018-10-17 MED ORDER — LOPERAMIDE 2 MG TABLET
ORAL_TABLET | 2 refills | 0 days | Status: CP
Start: 2018-10-17 — End: 2019-04-06

## 2018-10-18 NOTE — Unmapped (Signed)
Clinical Pharmacist Practitioner: Oral Chemotherapy Counseling    HPI: Krista Pratt is a 68 y.o. female with HER2-mutated NSCLC who I am calling today to counsel on initiation of neratinib.    Oral Chemotherapy Regimen: Neratinib 240 mg PO daily  Medication Access: manufacturer assistance due to high copay  Start Date: Monday, 10/27/2018    Education Provided:  ? Neratinib should be taken with food.  ? Should be stored at room temperature, ideally in original container.  ? Common adverse events may include diarrhea, nausea/vomiting, fatigue, hepatotoxicity, mucositis, rash, abdominal pain.  ? Monitoring parameters and management strategies for adverse events:  o LFTs should be checked monthly x 3 months then q82mos   o Provided Rx for Zofran to be used PRN for N/V  o Reviewed Imodium schedule as outlined below.    Imodium Schedule:  Neratinib has a risk of severe diarrhea. I discussed that she must take Imodium on a schedule while on neratinib. She should titrate the Imodium dose to 1-2 BM/day. If she starts to have >4 loose stools/day, she should increase her use of Imodium (max: 16 mg/day), increase fluid intake, and contact her oncology team. Imodium schedule is as follows:  ?? Days 1-14 (weeks 1-2): 4 mg TID  ?? Days 15-56 (weeks 3-8): 4 mg BID  ?? Days 56+: 4 mg PRN      Drug Interactions:  Medication profile was reviewed and there were no drug interactions noted at the time of review. Informed Krista Pratt that while on neratinib, grapefruit juice, H2 blockers, and PPIs should be avoided. Antacids, such as Tums, may be used but should be separated from neratinib by at least 3 hours.    F/U: clinic appt with Dr. Janann August on 11/18/18    Bobby Rumpf, PharmD, BCOP, CPP  Pager: [phone]     ------------------------------------    Current Medications:  Current Outpatient Medications   Medication Sig Dispense Refill   ??? acetaminophen (TYLENOL) 325 MG tablet Take 2 tablets (650 mg total) by mouth every four (4) hours as needed.  0 ??? azelastine (ASTELIN) 137 mcg nasal spray 1 spray by Each Nare route nightly. Use in each nostril as directed     ??? buPROPion (WELLBUTRIN XL) 150 MG 24 hr tablet Take 150 mg by mouth daily.     ??? cholecalciferol, vitamin D3, (VITAMIN D3 ORAL) Take 1,000 Units by mouth daily.      ??? clonazePAM (KLONOPIN) 0.5 MG tablet Take 0.5 mg by mouth two (2) times a day as needed.      ??? fluticasone (FLONASE) 50 mcg/actuation nasal spray 1 spray by Each Nare route daily as needed.      ??? LACTOBAC CMB #3/FOS/PANTETHINE (PROBIOTIC & ACIDOPHILUS ORAL) Take 1 packet by mouth daily at 0600.      ??? levothyroxine (SYNTHROID, LEVOTHROID) 50 MCG tablet Take 50 mcg by mouth daily at 0600.     ??? loperamide (IMODIUM A-D) 2 mg tablet 4 mg TID for 2 weeks, then 4 mg BID for 6 weeks, then 4 mg PRN after. 120 tablet 2   ??? loratadine (CLARITIN) 10 mg tablet Take 10 mg by mouth daily as needed.      ??? losartan (COZAAR) 100 MG tablet Take 100 mg by mouth once daily.  3   ??? metoprolol succinate (TOPROL-XL) 50 MG 24 hr tablet Take 75 mg by mouth daily.      ??? multivitamin (MULTIVITAMIN) per tablet Take 1 tablet by mouth daily.      ???  neratinib (NERLYNX) 40 mg tablet Take 6 tablets (240 mg total) by mouth daily. Take with food. 180 tablet 11   ??? ondansetron (ZOFRAN) 8 MG tablet Take 1 tablet (8 mg total) by mouth every eight (8) hours as needed for nausea. 30 tablet 2     No current facility-administered medications for this visit.         Laboratory Data:  No visits with results within 1 Day(s) from this visit.   Latest known visit with results is:   Appointment on 10/08/2018   Component Date Value   ??? Sodium 10/08/2018 141    ??? Potassium 10/08/2018 4.0    ??? Chloride 10/08/2018 102    ??? CO2 10/08/2018 28.0    ??? BUN 10/08/2018 12    ??? Creatinine 10/08/2018 0.89    ??? BUN/Creatinine Ratio 10/08/2018 13    ??? EGFR CKD-EPI Non-African* 10/08/2018 66    ??? EGFR CKD-EPI African Ame* 10/08/2018 76    ??? Glucose 10/08/2018 94    ??? Calcium 10/08/2018 10.3*   ??? Albumin 10/08/2018 5.0    ??? Total Protein 10/08/2018 7.7    ??? Total Bilirubin 10/08/2018 0.3    ??? AST 10/08/2018 25    ??? ALT 10/08/2018 17    ??? Alkaline Phosphatase 10/08/2018 88    ??? Anion Gap 10/08/2018 11    ??? WBC 10/08/2018 6.0    ??? RBC 10/08/2018 4.74    ??? HGB 10/08/2018 14.0    ??? HCT 10/08/2018 41.7    ??? MCV 10/08/2018 87.9    ??? MCH 10/08/2018 29.6    ??? MCHC 10/08/2018 33.6    ??? RDW 10/08/2018 14.3    ??? MPV 10/08/2018 7.0    ??? Platelet 10/08/2018 320    ??? Neutrophils % 10/08/2018 75.4    ??? Lymphocytes % 10/08/2018 12.4    ??? Monocytes % 10/08/2018 5.9    ??? Eosinophils % 10/08/2018 3.5    ??? Basophils % 10/08/2018 1.2    ??? Absolute Neutrophils 10/08/2018 4.5    ??? Absolute Lymphocytes 10/08/2018 0.8*   ??? Absolute Monocytes 10/08/2018 0.4    ??? Absolute Eosinophils 10/08/2018 0.2    ??? Absolute Basophils 10/08/2018 0.1    ??? Large Unstained Cells 10/08/2018 2

## 2018-10-22 NOTE — Unmapped (Signed)
Hi,     [name] contacted the [employer] requesting to speak with the care team of [name] to discuss:    Does she need Labs added on for her upcoming appointments?    Please contact at [phone].    Program: Lung  Speciality: Medical Oncology    Check Indicates criteria has been reviewed and confirmed with the patient:    []  Preferred Name   [x]  DOB and/or MR#  [x]  Preferred Contact Method  [x]  Phone Number(s)   []  MyChart     Thank you,   Vernie Ammons  [facility]   [phone]

## 2018-10-23 ENCOUNTER — Ambulatory Visit: Admit: 2018-10-23 | Discharge: 2018-10-24 | Payer: MEDICARE

## 2018-10-23 DIAGNOSIS — Z9189 Other specified personal risk factors, not elsewhere classified: Principal | ICD-10-CM

## 2018-10-23 NOTE — Unmapped (Signed)
[facility] Triage Note     Patient: Krista Pratt     Reason for call:  question about labs    Time call returned: [phone]     Phone Assessment: [name] was inquiring if she should have a lab appointment scheduled prior to her visit with Dr Janann August on 2/18. Last labs were drawn 10/08/2018 and she is planning on starting her Neratinib Monday.     Triage Recommendations: Will reach out to [facility]- someone will contact her to let her know if she needs labs prior to appt.     Patient Response: Satisfied with plan     Outstanding tasks: Reach out to [place] [facility]     Patient Pharmacy has been verified and primary pharmacy has been marked as preferred

## 2018-10-23 NOTE — Unmapped (Signed)
Followed-up with Krista Pratt. Lab appointment set. No needs at this time.

## 2018-10-23 NOTE — Unmapped (Signed)
[facility]   Comprehensive Cancer Support Program       THIS IS A [name]      Service Date: October 23, 2018    Location of patient:  [facility]    Consulting Provider:  Perry Mount, [place]    Reason for contact:  Coping with disease progression      Krista Pratt is a 68 yo woman with stage IV NSCLC initially diagnosed in 03/2016. Pt is s/p lobectomy LUL and mediastinal LND in 03/2016 and adjuvant chemotherapy and XRT completed in 10/2016. Local regional recurrence found in 03/2017, s/p resection in 06/2017. Imaging in 11/2017 concerning for recurrence and FNA in 03/2018 revealed atypical epithelial cells, s/p pneumonectomy in 01/2018. In 08/2018 routine surveillance imaging detected new right lung pulmonary nodules concerning for metastatic disease versus possible pneumonitis / pneumonia process.  Following course of antibiotics, 09/16/2018 repeat PET CT showed increase in size in multiple right lung nodules concerning for metastatic disease. At that time, plan was for pt to participate in clinical trial with tumor injection followed by radiation and immunotherapy with a medication called nivolumab. However, it was subsequently agreed by providers that nanoparticle injection either via bronchoscopy or percutaneous route would be too risky for Ms. Segundo given her prior left pneumonectomy. It is now planned to proceed with neratinib therapy and she is waiting to receive medication from specialty pharmacy. [name] has follow-up appointment scheduled with Dr. Janann August on 11/18/2018 and she is looking forward to the opportunity to have some of her questions answered. She was seen today by [facility] for follow-up supportive counseling.     [name] reported long h/o of depression for which she has taken Wellbutrin managed by her PCP. She began to feel that the Wellbutrin might be contributing to an increase in anxiety and she is following her PCP???s instructions in transitioning from Wellbutrin to Celexa and feels that Celexa is helping her mood. She is also prescribed clonazepam for anxiety. There were no concerns for pt safety identified during the interview.     Pt stated she has a high need for information and understanding of why a particular treatment is recommended. She has hopes of discussing with Dr. Janann August why single agent neratinib was recommended. She was quick to say it is not her desire or intent to challenge the recommendation but only to understand because understanding helps her cope. We talked about how she might initiate the conversation. She admitted she defaults to talking about the ???mechanics??? of her diagnosis and treatment and avoiding the emotional aspects. She finds it difficult to talk about her feelings even though she wants to.     Provided active listening and normalized pt's thoughts and feelings in her present circumstance. Provided supportive counseling. [name] desires ongoing supportive counseling and a follow-up appointment was scheduled for 2/7. She has my contact information.      Time spent: 60 minutes    Perry Mount, [place]  Counselor  Comprehensive Cancer Support Program  [phone]    THIS IS A NON-BILLABLE ENCOUNTER

## 2018-11-04 NOTE — Unmapped (Signed)
Mrs. Krista Pratt called to report side effects after starting neratinib last Monday 1/27. She started to experience significant diarrhea on Friday which lasted all weekend. She had up to 12 loose stools/day with one episode of fecal incontinence overnight. However, she notes that she was accidentally only taking 2 mg of Imodium TID instead of 4 mg TID as instructed. She also experienced some nausea over the last few days that usually occurred ~2 hours after taking neratinib. She held neratinib on Sunday and Monday and is feeling better now.     PLAN:   - Restart neratinib 240 mg today   - Increase Imodium to 2 tablets (4 mg) TID   - Reiterated that she may increase Imodium to 4 mg QID if needed   - Use Zofran 8 mg as pre-medication prior to neratinib dose    She will call me again on Friday with an update on how she is feeling.    Bobby Rumpf, PharmD, BCOP, CPP  Oncology Clinical Pharmacist Practitioner  Pager: [phone]

## 2018-11-11 ENCOUNTER — Ambulatory Visit: Payer: PRIVATE HEALTH INSURANCE | Admitting: Internal Medicine

## 2018-11-11 NOTE — Unmapped (Signed)
Clinical Pharmacist Practitioner: Telephone Note    HPI: [name] is a 68 y.o. female with ERBB2-mutated adenoNSCLC who I am calling today for oral chemotherapy follow-up.     Subjective/Objective:  Mrs. Mclin called to report worsening side effects with neratinib. She is currently on 240 mg (6 tablets) daily. Started on 1/27 but required treatment break from 2/2-2/4 for diarrhea. After restarting, she notes worsening fatigue, nausea, and decreased appetite. She thinks she has lost ~12 lbs since starting neratinib and feels poorly overall. Taking multiple naps during the day and lays on the couch for the large majority of the day. She's having ~4-5 episodes of diarrhea daily while using Imodium 4 mg 3-4x/day. She did not take neratinib yesterday or today.    Assessment/Plan: She is experiencing multiple toxicities from neratinib, including grade 3 fatigue, recurrent grade 2 diarrhea, and grade 2 nausea. I recommended holding neratinib until all toxicities resolve to grade <1, which could take up to 4 days or more based on neratinib half-life (~17 hrs).     Follow-Up: phone call on Thursday to assess symptoms and discuss restarting neratinib at reduced dose of 200 mg daily.    Approximate time spent with patient: 15 minutes.    Bobby Rumpf, PharmD, BCOP, CPP  Oncology Clinical Pharmacist Practitioner  Pager: [phone]

## 2018-11-14 NOTE — Unmapped (Signed)
Clinical Pharmacist Practitioner: Telephone Note    HPI: Krista Pratt is a 68 y.o. female with ERBB-2 mutated adenoNSCLC who is currently on treatment with neratinib.    Subjective/Objective:  Krista Pratt called to report that she was feeling back to baseline. Energy and appetite have improved and no issues with nausea or diarrhea.     Assessment/Plan:  ?? Restart neratinib at reduced dose of 200 mg daily  ?? Restart Imodium 4 mg TID    Follow-Up: clinic appt on 2/18    Approximate time spent with patient: 10 minutes.    Bobby Rumpf, PharmD, BCOP, CPP  Oncology Clinical Pharmacist Practitioner  Pager: [phone]

## 2018-11-17 ENCOUNTER — Ambulatory Visit: Admit: 2018-11-17 | Discharge: 2018-11-18 | Payer: MEDICARE

## 2018-11-17 DIAGNOSIS — C3490 Malignant neoplasm of unspecified part of unspecified bronchus or lung: Secondary | ICD-10-CM | POA: Diagnosis not present

## 2018-11-17 LAB — COMPREHENSIVE METABOLIC PANEL
ALBUMIN: 4.2 g/dL (ref 3.5–5.0)
ALKALINE PHOSPHATASE: 87 U/L (ref 38–126)
ALT (SGPT): 18 U/L (ref <35)
ANION GAP: 8 mmol/L (ref 7–15)
AST (SGOT): 23 U/L (ref 14–38)
BILIRUBIN TOTAL: 0.3 mg/dL (ref 0.0–1.2)
BLOOD UREA NITROGEN: 20 mg/dL (ref 7–21)
BUN / CREAT RATIO: 19
CALCIUM: 10 mg/dL (ref 8.5–10.2)
CHLORIDE: 109 mmol/L — ABNORMAL HIGH (ref 98–107)
CO2: 27 mmol/L (ref 22.0–30.0)
CREATININE: 1.03 mg/dL — ABNORMAL HIGH (ref 0.60–1.00)
EGFR CKD-EPI NON-AA FEMALE: 56 mL/min/{1.73_m2} — ABNORMAL LOW (ref >=60)
GLUCOSE RANDOM: 91 mg/dL (ref 65–179)
POTASSIUM: 4.3 mmol/L (ref 3.5–5.0)
PROTEIN TOTAL: 7 g/dL (ref 6.5–8.3)
SODIUM: 144 mmol/L (ref 135–145)

## 2018-11-17 LAB — CBC W/ AUTO DIFF
BASOPHILS ABSOLUTE COUNT: 0 10*9/L (ref 0.0–0.1)
BASOPHILS RELATIVE PERCENT: 0.5 %
EOSINOPHILS ABSOLUTE COUNT: 0.3 10*9/L (ref 0.0–0.4)
HEMOGLOBIN: 12.9 g/dL — ABNORMAL LOW (ref 13.5–16.0)
LARGE UNSTAINED CELLS: 2 % (ref 0–4)
LYMPHOCYTES ABSOLUTE COUNT: 0.6 10*9/L — ABNORMAL LOW (ref 1.5–5.0)
LYMPHOCYTES RELATIVE PERCENT: 8.7 %
MEAN CORPUSCULAR HEMOGLOBIN CONC: 33.4 g/dL (ref 31.0–37.0)
MEAN CORPUSCULAR HEMOGLOBIN: 29.1 pg (ref 26.0–34.0)
MEAN CORPUSCULAR VOLUME: 87.1 fL (ref 80.0–100.0)
MEAN PLATELET VOLUME: 7.2 fL (ref 7.0–10.0)
MONOCYTES ABSOLUTE COUNT: 0.3 10*9/L (ref 0.2–0.8)
MONOCYTES RELATIVE PERCENT: 4.8 %
NEUTROPHILS ABSOLUTE COUNT: 5.4 10*9/L (ref 2.0–7.5)
NEUTROPHILS RELATIVE PERCENT: 80.1 %
PLATELET COUNT: 243 10*9/L (ref 150–440)
RED BLOOD CELL COUNT: 4.44 10*12/L (ref 4.00–5.20)
RED CELL DISTRIBUTION WIDTH: 13.7 % (ref 12.0–15.0)

## 2018-11-17 LAB — HEPATIC FUNCTION PANEL
ALBUMIN: 4.2 g/dL (ref 3.5–5.0)
ALKALINE PHOSPHATASE: 90 U/L (ref 38–126)
ALT (SGPT): 19 U/L (ref <35)
AST (SGOT): 23 U/L (ref 14–38)
BILIRUBIN DIRECT: <0.1 mg/dL (ref 0.00–0.40)
BILIRUBIN TOTAL: 0.3 mg/dL (ref 0.0–1.2)

## 2018-11-17 LAB — BILIRUBIN TOTAL
Bilirubin:MCnc:Pt:Ser/Plas:Qn:: 0.3
Bilirubin:MCnc:Pt:Ser/Plas:Qn:: 0.3

## 2018-11-17 LAB — HEMOGLOBIN: Lab: 12.9 — ABNORMAL LOW

## 2018-11-18 ENCOUNTER — Ambulatory Visit: Admit: 2018-11-18 | Discharge: 2018-11-19 | Payer: MEDICARE

## 2018-11-18 ENCOUNTER — Ambulatory Visit
Admit: 2018-11-18 | Discharge: 2018-11-19 | Payer: MEDICARE | Attending: Hematology & Oncology | Primary: Hematology & Oncology

## 2018-11-18 DIAGNOSIS — C3490 Malignant neoplasm of unspecified part of unspecified bronchus or lung: Secondary | ICD-10-CM | POA: Diagnosis not present

## 2018-11-18 MED ORDER — OLANZAPINE 5 MG TABLET
ORAL_TABLET | Freq: Every evening | ORAL | 3 refills | 0 days | Status: CP
Start: 2018-11-18 — End: 2019-11-18

## 2018-11-18 NOTE — Unmapped (Addendum)
It was great to see you today.    I have sent zyprexa 5 mg nightly to help with your nausea.    Continue imodium for diarrhea.    Lequita Halt, our pharmacist, will check in with you within the next week to see how you are doing.     Please call us if you experience a fever of 100.5 or higher. Call us if your symptoms worsen or do not improve in a week.     We will see you back on 3/24 for scans and visit with Dr. Janann August.

## 2018-11-18 NOTE — Unmapped (Signed)
Referring Physician: Wonda Cerise, Md  [address]  [address] [place], [place] [address].   PCP: Sherlene Shams, MD  Medical Oncologist: Eloise Harman, MD  Radiation Oncologist: Rubye Beach, MD  Thoracic Surgeon: Merrie Roof, MD    Reason for visit: Krista Pratt was initially seen at the request of Dr. Jacqulyn Bath for a thoracic medical oncology opinion regarding new diagnosis of NSCLC.    DIAGNOSIS: NSCLC, adenocarcinoma    STAGE: pT2a N0, now likely stage IV    HPI:  Krista Pratt is a 68 y.o. female former smoker with a 12 pack-year smoking history and past medical history of HTN, hypothyroidism, OA, depression and glaucoma who was recently diagnosed with a pT2a N0.    - 08/2015 - persistent cough lead to a CT, which found a mass  - bronch-mediated Bx of LUL mass showed non-specific inflammation  - Followed with serial CT's  - 03/2016 - CT showed interval increase in size of LUL mass (extending from just SUP to L hilum to apex)  - Pt was asymptomatic at the time  - 03/2016 - PFT's nl  - PET showed FDG-avid lesion in LUL, no hot LN's or distant mets  - 04/04/16 (Dr. Jacqulyn Bath) - lobectomy and mediastinal LND   - Path showed invasive mucinous adenocarcinoma (~5 cm) with a SM+ at the stapled vascular margin. 0/12 LN's.  - completed adjuvant cis/pem and XRT in 1/18  - 7/18 local regional recurrence s/p resection 06/06/17, PD-L1 0%. Path showed invasive mucinous adenocarcinoma 1.6cm with negative margins. STRATA with ERBB2 mutation.   - recommended active surveillance  - 12/23/17 PET concerning for recurrence  - 01/06/18 FNA showed atypical epithelial cells  - 02/06/18 L pneumonectomy 5.6cm invasive mucinous adenocarcinoma ([id number])  -10/27/18 started neratinib for PD in multiple right lung nodules    Interim History:   Patient started neratinib on 1/27. She started on 240 mg once daily and experienced severe diarrhea with up to 12 loose stools/day. She held the medication for 2 days in early February then restarted at 240 mg daily with recurrence of side effects. She then had about 4-5 loose stools/day despite imodium use as well as profound fatigue and nausea. She held drug for about 3 days then restarted at 200 mg daily 5 days ago. She reports feeling much improved on the 200 mg daily dose. Her diarrhea did begin to recur yesterday with 2 episodes yesterday morning and 1 episode this morning. Her diarrhea did respond to imodium. She had significant nausea last night after she did not take her zofran pre-medication prior to her neratinib dose. She reports that her energy level is much improved. She has also had intermittent cough productive of green sputum for last 4-5 days but it is in the setting of upper respiratory symptoms including nasal congestion. She denies shortness of breath or fevers.     ROS:  10 systems were reviewed and found to be within normal limits except as described in the HPI.    PMH:  Past Medical History:   Diagnosis Date   ??? Allergic rhinitis    ??? Cancer (CMS-HCC)     Lung   ??? CTS (carpal tunnel syndrome)    ??? Depression    ??? Glaucoma     left eye   ??? Hypertension    ??? Hypothyroidism    ??? Osteoarthritis      PSH:  Past Surgical History:   Procedure Laterality Date   ???  APPENDECTOMY N/A 1963    ruptured w. complications   ??? CARPAL TUNNEL RELEASE Right 03/01/14   ??? EXPLORATORY LAPAROTOMY N/A 1989   ??? NASAL SINUS SURGERY N/A 2013   ??? PR BRNCHSC EBUS GUIDED SAMPL 1/2 NODE STATION/STRUX N/A 01/06/2018    Procedure: Bronch, Rigid Or Flexible, Inc Fluoro Guidance, When Performed; With Ebus Guided Transtracheal And/Or Transbronchial Sampling, One Or Two Mediastinal And/Or Hilar Lymph Node Stations Or Structures;  Surgeon: Mercy Moore, MD;  [facility]: [facility];  Service: Pulmonary   ??? PR BRONCHOSCOPY W/THER ASPIR TRACHBRNCL TREE 1ST N/A 06/06/2017    Procedure: Bronchoscpy, Rigid Or Flexible, W/Fluoro; With Therapeutic Aspiration Of Tracheobronchial Tree, Initial;  Surgeon: Cherie Dark, MD;  Location: MAIN OR [facility];  Service: Thoracic   ??? PR BRONCHOSCOPY W/THER ASPIR TRACHBRNCL TREE 1ST N/A 02/06/2018    Procedure: Bronchoscpy, Rigid Or Flexible, W/Fluoro; With Therapeutic Aspiration Of Tracheobronchial Tree, Initial;  Surgeon: Cherie Dark, MD;  Location: MAIN OR [facility];  Service: Thoracic   ??? PR BRONCHOSCOPY,COMPUTER ASSIST/IMAGE-GUIDED NAVIGATION N/A 08/31/2015    Procedure: Bronchoscopy, Flexible, Include Fluoro When Performed; W/Computer-Assist, Image-Guided Navigation;  Surgeon: Jerelyn Charles, MD;  Location: MAIN OR [facility];  Service: Pulmonary   ??? PR BRONCHOSCOPY,DIAGNOSTIC W LAVAGE Left 08/31/2015    Procedure: Bronchoscopy, Flexible, Include Fluoroscopic Guidance When Performed; W/Bronchial Alveolar Lavage;  Surgeon: Jerelyn Charles, MD;  Location: MAIN OR [facility];  Service: Pulmonary   ??? PR BRONCHOSCOPY,TRANSBRONCH BIOPSY Left 08/31/2015    Procedure: Bronchoscopy, Flexible, Include Fluoro Guidance When Performed; W/Transbronchial Lung Bx, Single Lobe;  Surgeon: Jerelyn Charles, MD;  Location: MAIN OR [facility];  Service: Pulmonary   ??? PR DECORTICATION,PULMONARY,TOTAL Left 02/06/2018    Procedure: Decortic Pulm (Separt Proc); Tot;  Surgeon: Cherie Dark, MD;  Location: MAIN OR [facility];  Service: Thoracic   ??? PR EXCIS INTERDIGITAL NEUROMA,EA Left 02/01/2015    Procedure: EXC INTERDIGITAL NEUROMA SNGL EA;  Surgeon: Christena Deem, MD;  Location: MAIN OR [facility];  Service: Orthopedics   ??? PR INJECT NERV BLCK,INTERCOST,MULTPL Left 06/06/2017    Procedure: Intercostal Nerve Block-Multi Levels;  Surgeon: Cherie Dark, MD;  Location: MAIN OR [facility];  Service: Thoracic   ??? PR LARYNGOPLASTY MEDIALIZATION UNLIATERAL Bilateral 04/30/2018    Procedure: LARYNGOPLASTY, MEDIALIZATION, UNILATERAL;  Surgeon: Hardie Pulley, MD;  Location: MAIN OR [facility];  Service: ENT   ??? PR MUSCLE-SKIN FLAP,TRUNK Left 02/06/2018    Procedure: Muscle, Myocutaneous, Or Fasciocutaneous Flap; Trunk; Surgeon: Cherie Dark, MD;  Location: MAIN OR [facility];  Service: Thoracic   ??? PR REMOVAL OF LUNG Left 02/06/2018    Procedure: REMOVAL OF LUNG, PNEUMONECTOMY;  Surgeon: Cherie Dark, MD;  Location: MAIN OR [facility];  Service: Thoracic   ??? PR THORACOSCOPY W/DX WEDGE RESEXN ANATO LUNG RESEXN Left 04/04/2016    Procedure: ROBOTIC XI THORACOSCOPY, SURGICAL; WITH DIAGNOSTIC WEDGE RESECTION FOLLOWED BY ANATOMIC LUNG RESECTION;  Surgeon: Cherie Dark, MD;  Location: [facility] OR [facility];  Service: Cardiothoracic   ??? PR THORACOTOMY W/THERAPEUTIC WEDGE RESEXN INITIAL Left 06/06/2017    Procedure: Thoracotomy; With Therapeutic Wedge Resection (Eg, Mass, Nodule), Initial;  Surgeon: Cherie Dark, MD;  Location: MAIN OR [facility];  Service: Thoracic   ??? PR THORACOTOMY,LYSE ADHESIONS Left 06/06/2017    Procedure: THORACOTOMY; WITH OPEN INTRAPLEURAL PNEUMONOLYSIS;  Surgeon: Cherie Dark, MD;  Location: [facility] OR [facility];  Service: Thoracic   ??? PR WRIST ARTHROSCOP,RELEASE XVERS LIG Right 02/26/2014    Procedure: ENDOSCOPY WRIST SURG;  W/RELEAS TRANSVER LIGAMT;  Surgeon: Lorelee Cover, MD;  Location: ASC OR [facility];  Service: Orthopedics   ??? TONSILLECTOMY Bilateral 1950's   ??? TUBAL LIGATION Bilateral 1991       MEDICATIONS:  has a current medication list which includes the following prescription(s): acetaminophen, azelastine, cholecalciferol (vitamin d3), citalopram, clonazepam, fluticasone propionate, lactobacillus 3/fos/pantethine, levothyroxine, loperamide, loratadine, losartan, metoprolol succinate, multivitamin, neratinib, and ondansetron.    ALLERGIES:   Allergies   Allergen Reactions   ??? Amlodipine      edema   ??? Codeine Nausea And Vomiting     Nausea and vomiting       SOCIAL HISTORY:  Social History     Social History Narrative    Quit smoking in 1980's. Previously smoked 1 ppd x 12 years.       reports that she quit smoking about 36 years ago. Her smoking use included cigarettes. She has a 12.00 pack-year smoking history. She has never used smokeless tobacco.    FAMILY HISTORY:  Cancer-related family history includes Prostate cancer in her father. There is no history of Cancer.    PHYSICAL EXAMINATION:  VS: BP 132/65, HR 85, RR 18, T 36.5, SaO2 98%, Wt 76.3 kg  GENERAL: Well appearing, pleasant female not in acute distress, accompanied by husband  MOUTH: Moist and clear without thrush   NECK: Supple  LYMPH: No cervical or supraclavicular lymphadenopathy  CHEST: decreased BS L side, R lung clear to auscultation; no rhonchi or wheezes  CV: Regular rate and rhythm, prominent S1/S2  ABD: Soft, not tender, not distended, no palpable organomegaly  EXTREMITIES: Warm and well perfused without cyanosis, clubbing or edema  NEURO: No focal neurologic deficits, speech and gait within normal limits  AFFECT: Varied and appropriate to situation  DERM: No lesions or rashes noted    PATHOLOGY:  Addendum   - PD-L1 (16X0) IHC has been requested, results to be reported in an addendum.  ??   Addendum electronically signed by Lorayne Bender, MD on 05/14/2016 at 1004   Final Diagnosis   A:  Lung, left upper lobe, wedge resection  - Invasive mucinous adenocarcinoma, see synoptic report for additional information.  ??  B:  Lung, left upper lobe, completion lobectomy   - Invasive mucinous adenocarcinoma, see synoptic report for additional information.  - No tumor seen in 7 lymph nodes (0/7).  ??  C:  Lymph node, level 7, resection  - No tumor seen in multiple lymph node fragments (0/1).  ??  D:  Lymph node, level 5L, resection  - No tumor seen in multiple lymph node fragments (0/1).  ??  E:  Lymph node, level 6L, resection  - No tumor seen in multiple lymph node fragments (0/1).  ??  F:  Lymph node, level 10L, resection  - No tumor seen in multiple lymph node fragments (0/1).  ??  G:  Lymph node, level 11, resection  - No tumor seen in multiple lymph node fragments (0/1).  ??   Electronically signed by Lorayne Bender, MD on 04/10/2016 at 1706   Synoptic Report LUNG: Resection   Lung - All Specimens   SPECIMEN   Specimen  Lobe(s) of lung   Lobes of Lung  Upper lobe   Procedure  Other (specify): Wedge resection and completion lobectomy   Specimen Laterality  Left   TUMOR   Primary Tumor Site  Upper lobe   Histologic Type  Invasive mucinous adenocarcinoma   Histologic Grade  Not applicable  Tumor Size  Cannot be determined: Transected tumor in specimens A and B (See Comment)   Tumor Focality  Unifocal   Visceral Pleura Invasion  Not identified   Tumor Extension  Not identified   Lymph-Vascular Invasion  Not identified   MARGINS   Bronchial Margin  Uninvolved by invasive carcinoma and carcinoma in situ   Vascular Margin  Involved by invasive carcinoma   Parenchymal Margin  Not applicable: (See Comment)   LYMPH NODES   Regional Lymph Nodes     Number of Lymph Nodes Examined  At least: 12   Lymph Node Involvement  No nodes involved   STAGE (pTNM)   Primary Tumor (pT)  pT2a: Tumor greater than 3 cm, but 5 cm or less in greatest dimension surrounded by lung or visceral pleura without bronchoscopic evidence of invasion more proximal than the lobar bronchus (i.e., not in the main bronchus); or Tumor 5 cm or less in greatest dimension with any of the following features of extent: involves main bronchus, 2 cm or more distal to the carina; invades the visceral pleura; associated with atelectasis or obstructive pneumonitis that extends to the hilar region but does nt involve the entire lung   Regional Lymph Nodes (pN)  pN0: No regional lymph node metastasis   .      Comment        The tumor is present within specimens A and B, and appears to have been transected in the initial wedge resection, and contiguous with tumor identified in the completion lobectomy.  Because the tumor is present within 2 specimens, the maximum tumor size cannot be determined with certainty; however, the tumor is estimated to be at least pT2a, and likely approaches 5 cm in maximum dimension.  Tumor abuts the staple lines of specimens A and B; however, these staple lines are likely in continuity representing the initial wedge resection, and these likely do not represent a true parenchymal margin.  Frozen section FSB2, the en face staple line at stitch, may also represent tumor at the wedge resection line, and FSB2 does show extensive involvement by tumor in the en face section.  However, this orientation is not clear, and correlation with intraoperative findings is recommended.  ??  In the en face vascular margin of the completion lobectomy (slide B3), there is invasive carcinoma focally present in the perivascular soft tissue adjacent to the vessel, and involvement of the adjacent true stapled margin cannot be excluded. This tissue would be immediately subjacent to the stapled vascular margin, with tumor extending to within at least 2 mm of the true margin, cannot exclude focal involvement by invasive carcinoma.  ??  The diagnosis was called to Dr. Shela Commons. Long on 04/10/16 at 4:00pm by Dr. Katrinka Blazing, and the case was discussed.  ??  The case was also reviewed by Dr. Bo Mcclintock and he concurs with the diagnosis.  ??  The frozen section diagnoses are confirmed.  ??     06/06/17 Wedge resection  Final Diagnosis   A: Lung, left lower lobe, wedge resection   - Ill defined nodule of invasive mucinous adenocarcinoma with lepidic spread and spread through air spaces, spanning 1.6 cm in this wedge biopsy, tumor abutting but not definitively involving pleura, clinically recurrent/metastatic lung adenocarcinoma  - Parenchymal margin free of tumor, tumor is 12 mm from the parenchymal margin   - Two intrapulmonary lymph nodes with no tumor seen (0/2)   ??  B: Lung, left lower lobe, wedge #2  -  Three portions of lung parenchyma and pleura with no tumor seen, mild fibrous pleural adhesions present on pleural surface of these specimens     5/19 Pneumonectomy  Final Diagnosis   A: Lung, left lower lobe, lobectomy  - Invasive mucinous adenocarcinoma, 5.5 cm in greatest dimension, clinically recurrent  - Tumor spread through air spaces is identified  - Surgical margins free of tumor  ??  B: Lymph node, 9L, biopsy  - No tumor seen in one lymph node (0/1)  ??     LABS    Results for orders placed or performed in visit on 11/17/18   Hepatic Function Panel   Result Value Ref Range    Albumin 4.2 3.5 - 5.0 g/dL    Total Protein 7.0 6.5 - 8.3 g/dL    Total Bilirubin 0.3 0.0 - 1.2 mg/dL    Bilirubin, Direct <1.61 0.00 - 0.40 mg/dL    AST 23 14 - 38 U/L    ALT 19 <35 U/L    Alkaline Phosphatase 90 38 - 126 U/L   Comprehensive Metabolic Panel   Result Value Ref Range    Sodium 144 135 - 145 mmol/L    Potassium 4.3 3.5 - 5.0 mmol/L    Chloride 109 (H) 98 - 107 mmol/L    Anion Gap 8 7 - 15 mmol/L    CO2 27.0 22.0 - 30.0 mmol/L    BUN 20 7 - 21 mg/dL    Creatinine 0.96 (H) 0.60 - 1.00 mg/dL    BUN/Creatinine Ratio 19     EGFR CKD-EPI Non-African American, Female 56 (L) >=60 mL/min/1.87m2    EGFR CKD-EPI African American, Female 67 >=60 mL/min/1.47m2    Glucose 91 65 - 179 mg/dL    Calcium 04.5 8.5 - 40.9 mg/dL    Albumin 4.2 3.5 - 5.0 g/dL    Total Protein 7.0 6.5 - 8.3 g/dL    Total Bilirubin 0.3 0.0 - 1.2 mg/dL    AST 23 14 - 38 U/L    ALT 18 <35 U/L    Alkaline Phosphatase 87 38 - 126 U/L   CBC w/ Differential   Result Value Ref Range    WBC 6.8 4.5 - 11.0 10*9/L    RBC 4.44 4.00 - 5.20 10*12/L    HGB 12.9 (L) 13.5 - 16.0 g/dL    HCT 81.1 91.4 - 78.2 %    MCV 87.1 80.0 - 100.0 fL    MCH 29.1 26.0 - 34.0 pg    MCHC 33.4 31.0 - 37.0 g/dL    RDW 95.6 21.3 - 08.6 %    MPV 7.2 7.0 - 10.0 fL    Platelet 243 150 - 440 10*9/L    Neutrophils % 80.1 %    Lymphocytes % 8.7 %    Monocytes % 4.8 %    Eosinophils % 4.0 %    Basophils % 0.5 %    Absolute Neutrophils 5.4 2.0 - 7.5 10*9/L    Absolute Lymphocytes 0.6 (L) 1.5 - 5.0 10*9/L    Absolute Monocytes 0.3 0.2 - 0.8 10*9/L    Absolute Eosinophils 0.3 0.0 - 0.4 10*9/L    Absolute Basophils 0.0 0.0 - 0.1 10*9/L    Large Unstained Cells 2 0 - 4 %         RADIOLOGY  CT Chest 09/15/18:  Multiple right lung nodules with interval increase in size; leading differential consideration is for metastatic disease.        STRATA  ERBB2  p.V842I  CTNNB1 p.S37F     ASSESSMENT AND PLAN: Ms. Krista Pratt is a 67yoF with history of Stage IB (pT2a pN0 pMx) invasive mucinous adenocarcinoma s/p multiple surgeries, now on neratinib.     Stage IV NSCLC: ERBB2 mutation. Currently on neratinib 200 mg daily (dose-reduced from 240 mg daily 5 days ago). Initialy with poor tolerance on 240 mg daily dose. She seems to be tolerating dose reduction well, but we discussed that we still have room to optimize supportive care medications and can further dose reduce if needed. The patient asked many appropriate questions regarding the rationale for the neratinib treatment today and we answered these questions to her satisfaction. We will repeat a scan on 12/23/18 (8 weeks after starting therapy).    Nausea: Related to neratinib. Currently taking zofran 30 minutes prior to neratinib administration with modest benefit. Will start zyprexa 5 mg nightly.     Diarrhea: Imodium 4 mg tid for ongoing diarrhea associated with neratinib.    RTC in 4 weeks for scans and provider visit.     Patient seen and discussed with Dr. Janann August.     Starr Sinclair, MD  PGY-5  Hematology/Oncology Fellow

## 2018-11-20 MED ORDER — NERATINIB 40 MG TABLET
ORAL_TABLET | Freq: Every day | ORAL | 11 refills | 0.00000 days | Status: CP
Start: 2018-11-20 — End: ?

## 2018-11-20 NOTE — Unmapped (Signed)
Addended by: Vidal Schwalbe on: 11/20/2018 12:18 PM     Modules accepted: Orders

## 2018-11-20 NOTE — Unmapped (Addendum)
Clinical Pharmacist Practitioner: Oral Chemo Follow-Up    Patient Name: Krista Pratt  Patient Age: 68 y.o.  Encounter Date: 11/18/2018  Primary Oncologist: Eloise Harman, MD    Reason for visit: oral chemotherapy follow-up    Assessment & Plan:   1. ERBB2-Mutated NSCLC: Started neratinib 240 mg daily on 1/27. Required holding medication twice and dose reduction to 200 mg daily for diarrhea, fatigue, and nausea. Since dose decrease on 2/13, these have started to improve. LFT???s reviewed and WNL.    ?? Continue neratinib 200 mg daily with food.  ?? New script sent to [facility] specialty pharmacy    2. Diarrhea: improved after dose reduction on 2/13. She has had a couple episodes of diarrhea, likely due to only taking Imodium PRN. Reiterated importance of following Imodium schedule to prevent severe diarrhea.   ?? Continue loperamide 4 mg BID  ?? If constipation occurs, she may decrease to 4 mg daily or PRN    3. Nausea: ondansetron premedication has provided some benefit, however she still experiences some nausea at bedtime which impacts her ability to fall asleep. Recommended that she stop taking Klonopin qHS for nausea; will try olanzapine instead as this can help with both nausea and sleep.   ?? Start olanzapine 5 mg qHS  ?? Continue ondansetron 8 mg prior to neratinib    F/U: scans and clinic appt in 1 month    ------------------------------------------------------------------------------------------------------  HPI: Ms.Krista Pratt is a 68 y.o. female with ERBB2-mutated NSCLC who I am seeing today for monitoring of neratinib.    Oral chemotherapy: neratinib 200 mg daily (DR from 240 mg daily for diarrhea and fatigue)  Start date: 10/27/18  Specialty pharmacy: [facility]  Copay: $0 through manufacturer assistance    Interim History: Fatigue, nausea and diarrhea have all improved since restarting neratinib at a reduced dose of 200 mg daily on 2/13. However nausea continues to be bothersome despite taking Zofran prior to neratinib. Zofran provides some nausea prevention as nausea was the worst last night when she did not take Zofran premedication. She typically takes neratinib ~6 PM, which helps with fatigue but then experiences nausea ~8PM. She has started using Klonopin for evening nausea so she can sleep through it. Diarrhea is also improved, but she only took 4 mg of loperamide once yesterday (instead of TID) and had 2 loose stools. She thinks loperamide is causing some of her fatigue/nausea, and doesn???t like the way it makes her feel. Since the dose reduction her weight has started to increase back to baseline. Denies rash, abdominal pain, mucositis.     Adherence: appropriately taking 5 tablets of neratinib with food at the same time each day. No missed doses since recent dose reduction.    Drug-Drug Interactions: none    Oncology History:  - 08/2015 - persistent cough lead to a CT, which found a mass  - bronch-mediated Bx of LUL mass showed non-specific inflammation  - Followed with serial CT's  - 03/2016 - CT showed interval increase in size of LUL mass (extending from just SUP to L hilum to apex)  - Pt was asymptomatic at the time  - 03/2016 - PFT's nl  - PET showed FDG-avid lesion in LUL, no hot LN's or distant mets  - 04/04/16 (Dr. Jacqulyn Bath) - lobectomy and mediastinal LND   - Path showed invasive mucinous adenocarcinoma (~5 cm) with a SM+ at the stapled vascular margin. 0/12 LN's.  - completed adjuvant cis/pem and XRT in 1/18  - 7/18 local  regional recurrence s/p resection 06/06/17, PD-L1 0%. Path showed invasive mucinous adenocarcinoma 1.6cm with negative margins. STRATA with ERBB2 mutation.   - recommended active surveillance  - 12/23/17 PET concerning for recurrence  - 01/06/18 FNA showed atypical epithelial cells  - 02/06/18 L pneumonectomy 5.6cm invasive mucinous adenocarcinoma ([id number])  -10/27/18 started neratinib for PD in multiple right lung nodules    Problem List:   Patient Active Problem List   Diagnosis   ??? Allergic state   ??? Dysthymic disorder   ??? Carpal tunnel syndrome   ??? Osteoarthritis of both knees   ??? Endometriosis   ??? GERD (gastroesophageal reflux disease)   ??? Hypertension (RAF-HCC)   ??? Hypothyroidism   ??? Pure hypercholesterolemia   ??? Abnormal chest CT   ??? Mass of upper lobe of left lung   ??? Anxiety and depression   ??? Adenocarcinoma, lung (CMS-HCC)   ??? Allergic rhinitis   ??? Dyspnea   ??? Medicare annual wellness visit, initial   ??? Obesity (BMI 30-39.9)   ??? Right shoulder pain   ??? Screening for breast cancer   ??? On antineoplastic chemotherapy   ??? 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 Oral thrush   ??? Dysphonia   ??? Acute bronchitis   ??? Renal failure   ??? Allergy   ??? Tachycardia   ??? Encounter for Medicare annual wellness exam   ??? Sinusitis, acute, maxillary   ??? Status post L pneumonectomy   ??? Cough in adult   ??? UTI (urinary tract infection)   ??? Adenocarcinoma of lung, stage 1, left (CMS-HCC)   ??? Left carotid bruit   ??? Prediabetes       Medications:  Current Outpatient Medications   Medication Sig Dispense Refill   ??? acetaminophen (TYLENOL) 325 MG tablet Take 2 tablets (650 mg total) by mouth every four (4) hours as needed.  0   ??? azelastine (ASTELIN) 137 mcg nasal spray 1 spray by Each Nare route nightly. Use in each nostril as directed     ??? cholecalciferol, vitamin D3, (VITAMIN D3 ORAL) Take 1,000 Units by mouth daily.      ??? citalopram (CELEXA) 20 MG tablet Take 20 mg by mouth daily.     ??? clonazePAM (KLONOPIN) 0.5 MG tablet Take 0.5 mg by mouth two (2) times a day as needed.      ??? fluticasone (FLONASE) 50 mcg/actuation nasal spray 1 spray by Each Nare route daily as needed.      ??? LACTOBAC CMB #3/FOS/PANTETHINE (PROBIOTIC & ACIDOPHILUS ORAL) Take 1 packet by mouth daily at 0600.      ??? levothyroxine (SYNTHROID, LEVOTHROID) 50 MCG tablet Take 50 mcg by mouth daily at 0600.     ??? loperamide (IMODIUM A-D) 2 mg tablet 4 mg TID for 2 weeks, then 4 mg BID for 6 weeks, then 4 mg PRN after. 120 tablet 2   ??? loratadine (CLARITIN) 10 mg tablet Take 10 mg by mouth daily as needed.      ??? losartan (COZAAR) 100 MG tablet Take 100 mg by mouth once daily.  3   ??? metoprolol succinate (TOPROL-XL) 50 MG 24 hr tablet Take 75 mg by mouth daily.      ??? multivitamin (MULTIVITAMIN) per tablet Take 1 tablet by mouth daily.      ??? neratinib (NERLYNX) 40 mg tablet Take 6 tablets (240 mg total) by mouth daily. Take with food. 180 tablet 11   ??? OLANZapine (ZYPREXA) 5 MG tablet  Take 1 tablet (5 mg total) by mouth nightly. 90 tablet 3   ??? ondansetron (ZOFRAN) 8 MG tablet Take 1 tablet (8 mg total) by mouth every eight (8) hours as needed for nausea. 30 tablet 2     No current facility-administered medications for this visit.        Allergies:  Allergies   Allergen Reactions   ??? Amlodipine      edema   ??? Codeine Nausea And Vomiting     Nausea and vomiting       Personal and Social History:   Social History     Tobacco Use   ??? Smoking status: Former Smoker     Packs/day: 1.00     Years: 12.00     Pack years: 12.00     Types: Cigarettes     Last attempt to quit: 01/24/1982     Years since quitting: 36.8   ??? Smokeless tobacco: Never Used   Substance Use Topics   ??? Alcohol use: Yes     Alcohol/week: 0.0 standard drinks     Frequency: Monthly or less     Drinks per session: 1 or 2     Binge frequency: Never     Comment: ocaasionally   She reports no history of drug use.    Family History:  Her family history includes COPD in her mother; Diabetes in her maternal grandmother; Heart disease in her mother; Hypertension in her mother; No Known Problems in her brother, maternal aunt, maternal grandfather, maternal uncle, paternal aunt, paternal grandfather, paternal grandmother, paternal uncle, and sister; Osteoporosis in her mother; Prostate cancer in her father; Rheumatologic disease in her mother; Stroke in her mother.    Review of Systems: A complete review of systems was obtained including: Constitutional, Eyes, ENT, Cardiovascular, Respiratory, GI, GU, Musculoskeletal, Skin, Neurological, Psychiatric, Endocrine, Heme/Lymphatic, and Allergic/Immunologic systems. All other systems reviewed and are negative to the patient???s management except for what was mentioned in the interim history.     Vital Signs: BP 132/65  - Pulse 85  - Temp 36.5 ??C (97.7 ??F)  - Resp 16  - Wt 76.3 kg (168 lb 4.8 oz)  - LMP  (LMP Unknown)  - SpO2 98%  - BMI 32.87 kg/m??     Laboratory Data:  No visits with results within 1 Day(s) from this visit.   Latest known visit with results is:   Appointment on 11/17/2018   Component Date Value   ??? Albumin 11/17/2018 4.2    ??? Total Protein 11/17/2018 7.0    ??? Total Bilirubin 11/17/2018 0.3    ??? Bilirubin, Direct 11/17/2018 <0.10    ??? AST 11/17/2018 23    ??? ALT 11/17/2018 19    ??? Alkaline Phosphatase 11/17/2018 90    ??? Sodium 11/17/2018 144    ??? Potassium 11/17/2018 4.3    ??? Chloride 11/17/2018 109*   ??? Anion Gap 11/17/2018 8    ??? CO2 11/17/2018 27.0    ??? BUN 11/17/2018 20    ??? Creatinine 11/17/2018 1.03*   ??? BUN/Creatinine Ratio 11/17/2018 19    ??? EGFR CKD-EPI Non-African* 11/17/2018 56*   ??? EGFR CKD-EPI African Ame* 11/17/2018 64    ??? Glucose 11/17/2018 91    ??? Calcium 11/17/2018 10.0    ??? Albumin 11/17/2018 4.2    ??? Total Protein 11/17/2018 7.0    ??? Total Bilirubin 11/17/2018 0.3    ??? AST 11/17/2018 23    ??? ALT 11/17/2018 18    ???  Alkaline Phosphatase 11/17/2018 87    ??? WBC 11/17/2018 6.8    ??? RBC 11/17/2018 4.44    ??? HGB 11/17/2018 12.9*   ??? HCT 11/17/2018 38.7    ??? MCV 11/17/2018 87.1    ??? [facility] 11/17/2018 29.1    ??? MCHC 11/17/2018 33.4    ??? RDW 11/17/2018 13.7    ??? MPV 11/17/2018 7.2    ??? Platelet 11/17/2018 243    ??? Neutrophils % 11/17/2018 80.1    ??? Lymphocytes % 11/17/2018 8.7    ??? Monocytes % 11/17/2018 4.8    ??? Eosinophils % 11/17/2018 4.0    ??? Basophils % 11/17/2018 0.5    ??? Absolute Neutrophils 11/17/2018 5.4    ??? Absolute Lymphocytes 11/17/2018 0.6*   ??? Absolute Monocytes 11/17/2018 0.3    ??? Absolute Eosinophils 11/17/2018 0.3    ??? Absolute Basophils 11/17/2018 0.0 ??? Large Unstained Cells 11/17/2018 2         I spent 30 minutes with Ms.Dirden in direct patient care.    Kathleen Argue, PharmD   PGY-2 Hematology/Oncology Resident     Attestation: I have reviewed and agree with the assessment and plan as documented by the pharmacy resident.  Bobby Rumpf, PharmD, BCOP, CPP  Oncology Clinical Pharmacist Practitioner  Pager: [phone]

## 2018-11-26 ENCOUNTER — Ambulatory Visit: Admit: 2018-11-26 | Discharge: 2018-11-27 | Payer: MEDICARE

## 2018-11-26 DIAGNOSIS — Z9189 Other specified personal risk factors, not elsewhere classified: Principal | ICD-10-CM

## 2018-11-26 NOTE — Unmapped (Signed)
[facility]   Comprehensive Cancer Support Program       THIS IS A [name]      Service Date: November 26, 2018    Location of patient:  [facility]    Consulting Provider:  Perry Mount, [place]    Reason for contact:  Coping with disease progression      Krista Pratt is a 68 yo woman with stage IV NSCLC initially diagnosed in 03/2016. Pt is s/p lobectomy LUL and mediastinal LND in 03/2016 and adjuvant chemotherapy and XRT completed in 10/2016. Local regional recurrence found in 03/2017, s/p resection in 06/2017. Imaging in 11/2017 concerning for recurrence and FNA in 03/2018 revealed atypical epithelial cells, s/p pneumonectomy in 01/2018. In 08/2018 routine surveillance imaging detected new right lung pulmonary nodules concerning for metastatic disease versus possible pneumonitis / pneumonia process.  Following course of antibiotics, 09/16/2018 repeat PET CT showed increase in size in multiple right lung nodules concerning for metastatic disease, currently treated with neratinib.    Krista Pratt reported long h/o of depression for which she has taken Wellbutrin managed by her PCP. She began to feel that the Wellbutrin might be contributing to an increase in anxiety and she is following her PCP???s instructions in transitioning from Wellbutrin to Celexa and feels that Celexa is helping her mood. She is also prescribed clonazepam for anxiety. There were no concerns for pt safety identified during the interview.    Pt stated she has a high need for information and understanding of why a particular treatment is recommended. She met with Dr. Janann August earlier this month and was pleased with her conversation with him and that she was able to have her questions answered. She had significant side effects to neratinib initially but is tolerating better after dose reduction. She is also taking Zyprexa for nausea and has also found it helpful for her mood, allowing her to have difficult conversations with her family about her diagnosis and treatment.     Provided active listening and normalized pt's thoughts and feelings in her present circumstance. Provided supportive counseling. Krista Pratt desires ongoing supportive counseling and a follow-up appointment was scheduled for 3/5. She has my contact information.      Time spent: 60 minutes     Perry Mount, [place]  Counselor  Comprehensive Cancer Support Program  [phone]    THIS IS A NON-BILLABLE ENCOUNTER

## 2018-11-28 NOTE — Unmapped (Signed)
Clinical Pharmacist Practitioner: Telephone Note    HPI: Krista Pratt is a 68 y.o. female with ERBB-2 mutated adenoNSCLC who is currently on treatment with neratinib.    Subjective/Objective:  Krista Pratt called to report that she is no longer having nausea since starting olanzapine 5mg  nightly, but is now sleeping 12+ hours a day. She wanted to know if she could reduce the dose.     Her diarrhea has significantly improved with using imodium 4mg  TID. She is only having 1-2 loose bowel movements a day. She is also endorsing sensitivity to seasoning and spices in her foods, but denies any open sores or blisters in her mouth. She is not having any other ADRs from neratinib.     Assessment/Plan: Tolerating neratinib well with no significant toxicities at this time.   ?? Continue neratinib at reduced dose of 200 mg daily  ?? Continue Imodium 4 mg TID  ?? Decrease olanzapine to 2.5mg  qHS  ?? Start salt and soda rinse for stomatitis     Follow-Up: clinic appt on 3/24 with Dr. Janann August    Approximate time spent with patient: 10 minutes.    Krista Pratt, PharmD   PGY-2 Oncology Pharmacy Resident  Pager: [phone]    Attestation: I have reviewed and agree with the assessment and plan as documented by the pharmacy resident.  Krista Pratt, PharmD, BCOP, CPP  Oncology Clinical Pharmacist Practitioner  Pager: [phone]

## 2018-12-04 ENCOUNTER — Ambulatory Visit: Admit: 2018-12-04 | Discharge: 2018-12-05 | Payer: MEDICARE

## 2018-12-04 DIAGNOSIS — Z9189 Other specified personal risk factors, not elsewhere classified: Principal | ICD-10-CM

## 2018-12-08 NOTE — Unmapped (Signed)
[facility]   Comprehensive Cancer Support Program       THIS IS A [name]      Service Date: December 04, 2018    Location of patient:  [facility]    Consulting Provider:  Perry Mount, [place]    Reason for contact:  Depression and Anxiety      Krista Pratt is a 68 yo woman with stage IV NSCLC initially diagnosed in 03/2016. Pt is s/p lobectomy LUL and mediastinal LND in 03/2016 and adjuvant chemotherapy and XRT completed in 10/2016. Local regional recurrence found in 03/2017, s/p resection in 06/2017. Imaging in 11/2017 concerning for recurrence and FNA in 03/2018 revealed atypical epithelial cells, s/p pneumonectomy in 01/2018. In 08/2018 routine surveillance imaging detected new right lung pulmonary nodules concerning for metastatic disease versus possible pneumonitis / pneumonia process.  Following course of antibiotics, 09/16/2018 repeat PET CT showed increase in size in multiple right lung nodules concerning for metastatic disease, currently treated with neratinib.     Krista Pratt reported long h/o of depression for which she has taken Wellbutrin managed by her PCP. She began to feel that the Wellbutrin might be contributing to an increase in anxiety and she is following her PCP???s instructions in transitioning from Wellbutrin to Celexa and feels that Celexa is helping her mood. She is also prescribed clonazepam for anxiety. There were no concerns for pt safety identified during the interview.     Pt stated she has a high need for information and understanding of why a particular treatment is recommended. She met with Dr. Janann August earlier this month and was pleased with her conversation with him and that she was able to have her questions answered. She had significant side effects to neratinib initially but is tolerating better after dose reduction. She is also taking Zyprexa for nausea and has also found it helpful for her mood, allowing her to have difficult conversations with her family about her diagnosis and treatment. She has recently had one such conversation with her husband and is now making plans to have time with each of her sons to talk with them about her wishes.     Provided active listening and normalized pt's thoughts and feelings in her present circumstance. Provided supportive counseling. Krista Pratt desires ongoing supportive counseling and a follow-up appointment was scheduled for 3/11. She has my contact information.      Time spent: 60 minutes     Perry Mount, [place]  Counselor  Comprehensive Cancer Support Program  [phone]    THIS IS A NON-BILLABLE ENCOUNTER

## 2018-12-10 ENCOUNTER — Ambulatory Visit: Admit: 2018-12-10 | Discharge: 2018-12-11 | Payer: MEDICARE

## 2018-12-10 DIAGNOSIS — Z9189 Other specified personal risk factors, not elsewhere classified: Principal | ICD-10-CM

## 2018-12-11 NOTE — Unmapped (Signed)
[facility]   Comprehensive Cancer Support Program       THIS IS A [name]      Service Date: December 10, 2018    Location of patient:  [facility]    Consulting Provider:  Perry Mount, [place]    Reason for contact:  Coping with cancer diagnosis      Krista Pratt is a 68 yo woman with stage IV NSCLC initially diagnosed in 03/2016. Pt is s/p lobectomy LUL and mediastinal LND in 03/2016 and adjuvant chemotherapy and XRT completed in 10/2016. Local regional recurrence found in 03/2017, s/p resection in 06/2017. Imaging in 11/2017 concerning for recurrence and FNA in 03/2018 revealed atypical epithelial cells, s/p pneumonectomy in 01/2018. In 08/2018 routine surveillance imaging detected new right lung pulmonary nodules concerning for metastatic disease versus possible pneumonitis / pneumonia process.  Following course of antibiotics, 09/16/2018 repeat PET CT showed increase in size in multiple right lung nodules concerning for metastatic disease, currently treated with neratinib.     Krista Pratt reported long h/o of depression for which she has taken Wellbutrin managed by her PCP. She began to feel that the Wellbutrin might be contributing to an increase in anxiety and she is following her PCP???s instructions in transitioning from Wellbutrin to Celexa and feels that Celexa is helping her mood. She is also prescribed clonazepam for anxiety. She continues Zyprexa for nausea and has also found it helpful for her mood and sleep. She feels this has helped her have difficult conversations with her family about her diagnosis and treatment. She recently had one such conversation with her husband and has made plans to have time with each of her sons to talk with them about her wishes. There were no concerns for pt safety identified during the interview.     Provided active listening and normalized pt's thoughts and feelings in her present circumstance. Provided supportive counseling. Krista Pratt desires ongoing supportive counseling and a follow-up appointment was scheduled for 3/18. She has my contact information.      Time spent: 60 minutes     Perry Mount, [place]  Counselor  Comprehensive Cancer Support Program  [phone]    THIS IS A NON-BILLABLE ENCOUNTER

## 2018-12-15 NOTE — Unmapped (Signed)
[facility]   Comprehensive Cancer Support Program       THIS IS A [name]      Date: December 15, 2018        Krista Pratt was called and a voice message left informing her that due to COVID-19 all non-essential outpatient clinic appointments have been cancelled at least for the next two weeks. Therefore, our appointment scheduled for 3/18 is cancelled. She will be contacted to reschedule at an appropriate time.    Perry Mount, [place]  Counselor  Comprehensive Cancer Support Program  [phone]    THIS IS A NON-BILLABLE ENCOUNTER

## 2018-12-22 NOTE — Unmapped (Signed)
12/22/18    Travel Screening Questions Completed.    Travel Screening Questions/Answers:  1). Have you traveled within the last 14 days?: No  2). Do you have new or worsening respiratory symptoms (e.g. cough, difficulty breathing)?: No  3). Have you had close contact with a person with confirmed COVID-19 in the last 14 days before symptoms began?: No            The call was handled in the following manner:  Pt. advised on the visitor policy  Travel screening completed.

## 2018-12-23 ENCOUNTER — Ambulatory Visit: Admit: 2018-12-23 | Discharge: 2018-12-24 | Payer: MEDICARE

## 2018-12-23 ENCOUNTER — Institutional Professional Consult (permissible substitution)
Admit: 2018-12-23 | Discharge: 2018-12-24 | Payer: MEDICARE | Attending: Hematology & Oncology | Primary: Hematology & Oncology

## 2018-12-23 DIAGNOSIS — C3490 Malignant neoplasm of unspecified part of unspecified bronchus or lung: Principal | ICD-10-CM

## 2018-12-23 LAB — HEPATIC FUNCTION PANEL
ALBUMIN: 4.7 g/dL (ref 3.5–5.0)
ALKALINE PHOSPHATASE: 102 U/L (ref 38–126)
AST (SGOT): 25 U/L (ref 14–38)
BILIRUBIN TOTAL: 0.4 mg/dL (ref 0.0–1.2)
PROTEIN TOTAL: 7.7 g/dL (ref 6.5–8.3)

## 2018-12-23 LAB — CBC W/ AUTO DIFF
BASOPHILS ABSOLUTE COUNT: 0 10*9/L (ref 0.0–0.1)
BASOPHILS RELATIVE PERCENT: 0.5 %
EOSINOPHILS ABSOLUTE COUNT: 0.4 10*9/L (ref 0.0–0.4)
EOSINOPHILS RELATIVE PERCENT: 5.7 %
HEMATOCRIT: 38.4 % (ref 36.0–46.0)
HEMOGLOBIN: 12.8 g/dL — ABNORMAL LOW (ref 13.5–16.0)
LARGE UNSTAINED CELLS: 2 % (ref 0–4)
LYMPHOCYTES ABSOLUTE COUNT: 0.7 10*9/L — ABNORMAL LOW (ref 1.5–5.0)
LYMPHOCYTES RELATIVE PERCENT: 10.4 %
MEAN CORPUSCULAR HEMOGLOBIN CONC: 33.4 g/dL (ref 31.0–37.0)
MEAN CORPUSCULAR HEMOGLOBIN: 29.1 pg (ref 26.0–34.0)
MEAN PLATELET VOLUME: 6.9 fL — ABNORMAL LOW (ref 7.0–10.0)
MONOCYTES ABSOLUTE COUNT: 0.4 10*9/L (ref 0.2–0.8)
NEUTROPHILS ABSOLUTE COUNT: 5.2 10*9/L (ref 2.0–7.5)
NEUTROPHILS RELATIVE PERCENT: 76 %
NEUTROPHILS RELATIVE PERCENT: 76 % — ABNORMAL LOW (ref 4.00–5.20)
PLATELET COUNT: 276 10*9/L (ref 150–440)
RED BLOOD CELL COUNT: 4.41 10*12/L (ref 4.00–5.20)
RED CELL DISTRIBUTION WIDTH: 14.6 % (ref 12.0–15.0)
WBC ADJUSTED: 6.8 10*9/L (ref 4.5–11.0)

## 2018-12-23 LAB — COMPREHENSIVE METABOLIC PANEL
ALBUMIN: 4.7 g/dL (ref 3.5–5.0)
ALKALINE PHOSPHATASE: 101 U/L (ref 38–126)
ALT (SGPT): 19 U/L (ref <35)
ANION GAP: 13 mmol/L (ref 7–15)
AST (SGOT): 26 U/L (ref 14–38)
BILIRUBIN TOTAL: 0.4 mg/dL (ref 0.0–1.2)
BLOOD UREA NITROGEN: 21 mg/dL (ref 7–21)
BUN / CREAT RATIO: 18
CALCIUM: 10.1 mg/dL (ref 8.5–10.2)
CHLORIDE: 106 mmol/L (ref 98–107)
CO2: 24 mmol/L (ref 22.0–30.0)
CREATININE: 1.14 mg/dL — ABNORMAL HIGH (ref 0.60–1.00)
EGFR CKD-EPI AA FEMALE: 57 mL/min/{1.73_m2} — ABNORMAL LOW (ref >=60)
EGFR CKD-EPI NON-AA FEMALE: 49 mL/min/{1.73_m2} — ABNORMAL LOW (ref >=60)
GLUCOSE RANDOM: 104 mg/dL (ref 65–179)
GLUCOSE RANDOM: 104 mg/dL — ABNORMAL LOW (ref 65–179)
PROTEIN TOTAL: 7.7 g/dL (ref 6.5–8.3)

## 2018-12-23 MED ORDER — DIPHENOXYLATE-ATROPINE 2.5 MG-0.025 MG TABLET
ORAL_TABLET | Freq: Four times a day (QID) | ORAL | 1 refills | 0.00000 days | Status: CP | PRN
Start: 2018-12-23 — End: 2019-12-23

## 2018-12-23 NOTE — Unmapped (Signed)
Referring Physician: Referred Self  No address on file.   PCP: Sherlene Shams, MD  Medical Oncologist: Eloise Harman, MD  Radiation Oncologist: Rubye Beach, MD  Thoracic Surgeon: Merrie Roof, MD    Reason for visit: Krista Pratt was initially seen at the request of Dr. Jacqulyn Bath for a thoracic medical oncology opinion regarding new diagnosis of NSCLC.    DIAGNOSIS: NSCLC, adenocarcinoma    STAGE: pT2a N0, now likely stage IV    HPI:  Krista Pratt is a 68 y.o. female former smoker with a 12 pack-year smoking history and past medical history of HTN, hypothyroidism, OA, depression and glaucoma who was recently diagnosed with a pT2a N0.    - 08/2015 - persistent cough lead to a CT, which found a mass  - bronch-mediated Bx of LUL mass showed non-specific inflammation  - Followed with serial CT's  - 03/2016 - CT showed interval increase in size of LUL mass (extending from just SUP to L hilum to apex)  - Pt was asymptomatic at the time  - 03/2016 - PFT's nl  - PET showed FDG-avid lesion in LUL, no hot LN's or distant mets  - 04/04/16 (Dr. Jacqulyn Bath) - lobectomy and mediastinal LND   - Path showed invasive mucinous adenocarcinoma (~5 cm) with a SM+ at the stapled vascular margin. 0/12 LN's.  - completed adjuvant cis/pem and XRT in 1/18  - 7/18 local regional recurrence s/p resection 06/06/17, PD-L1 0%. Path showed invasive mucinous adenocarcinoma 1.6cm with negative margins. STRATA with ERBB2 mutation.   - recommended active surveillance  - 12/23/17 PET concerning for recurrence  - 01/06/18 FNA showed atypical epithelial cells  - 02/06/18 L pneumonectomy 5.6cm invasive mucinous adenocarcinoma ([id number])  -10/27/18 started neratinib for PD in multiple right lung nodules    Interim History:   Patient started neratinib on 1/27, now DR 2/2 diarrhea.  Having 2-3 loose stools per day.  Taking immodium x 2 in AM in 2 in evening.  Some days w/o diarrhea.  Nausea resolved.  Cough resolved.  Temperature normalized.    ROS:  10 systems were reviewed and found to be within normal limits except as described in the HPI.    PMH:  Past Medical History:   Diagnosis Date   ??? Allergic rhinitis    ??? Cancer (CMS-HCC)     Lung   ??? CTS (carpal tunnel syndrome)    ??? Depression    ??? Glaucoma     left eye   ??? Hypertension    ??? Hypothyroidism    ??? Osteoarthritis      PSH:  Past Surgical History:   Procedure Laterality Date   ??? APPENDECTOMY N/A 1963    ruptured w. complications   ??? CARPAL TUNNEL RELEASE Right 03/01/14   ??? EXPLORATORY LAPAROTOMY N/A 1989   ??? NASAL SINUS SURGERY N/A 2013   ??? PR BRNCHSC EBUS GUIDED SAMPL 1/2 NODE STATION/STRUX N/A 01/06/2018    Procedure: Bronch, Rigid Or Flexible, Inc Fluoro Guidance, When Performed; With Ebus Guided Transtracheal And/Or Transbronchial Sampling, One Or Two Mediastinal And/Or Hilar Lymph Node Stations Or Structures;  Surgeon: Mercy Moore, MD;  Location: MAIN OR [facility];  Service: Pulmonary   ??? PR BRONCHOSCOPY W/THER ASPIR TRACHBRNCL TREE 1ST N/A 06/06/2017    Procedure: Bronchoscpy, Rigid Or Flexible, W/Fluoro; With Therapeutic Aspiration Of Tracheobronchial Tree, Initial;  Surgeon: Cherie Dark, MD;  Location: MAIN OR [facility];  Service: Thoracic   ??? PR BRONCHOSCOPY W/THER ASPIR TRACHBRNCL TREE  1ST N/A 02/06/2018    Procedure: Bronchoscpy, Rigid Or Flexible, W/Fluoro; With Therapeutic Aspiration Of Tracheobronchial Tree, Initial;  Surgeon: Cherie Dark, MD;  Location: MAIN OR [facility];  Service: Thoracic   ??? PR BRONCHOSCOPY,COMPUTER ASSIST/IMAGE-GUIDED NAVIGATION N/A 08/31/2015    Procedure: Bronchoscopy, Flexible, Include Fluoro When Performed; W/Computer-Assist, Image-Guided Navigation;  Surgeon: Jerelyn Charles, MD;  Location: MAIN OR [facility];  Service: Pulmonary   ??? PR BRONCHOSCOPY,DIAGNOSTIC W LAVAGE Left 08/31/2015    Procedure: Bronchoscopy, Flexible, Include Fluoroscopic Guidance When Performed; W/Bronchial Alveolar Lavage;  Surgeon: Jerelyn Charles, MD;  Location: MAIN OR [facility];  Service: Pulmonary   ??? PR BRONCHOSCOPY,TRANSBRONCH BIOPSY Left 08/31/2015    Procedure: Bronchoscopy, Flexible, Include Fluoro Guidance When Performed; W/Transbronchial Lung Bx, Single Lobe;  Surgeon: Jerelyn Charles, MD;  Location: MAIN OR [facility];  Service: Pulmonary   ??? PR DECORTICATION,PULMONARY,TOTAL Left 02/06/2018    Procedure: Decortic Pulm (Separt Proc); Tot;  Surgeon: Cherie Dark, MD;  Location: [facility] OR [facility];  Service: Thoracic   ??? PR EXCIS INTERDIGITAL NEUROMA,EA Left 02/01/2015    Procedure: EXC INTERDIGITAL NEUROMA SNGL EA;  Surgeon: Christena Deem, MD;  Location: MAIN OR [facility];  Service: Orthopedics   ??? PR INJECT NERV BLCK,INTERCOST,MULTPL Left 06/06/2017    Procedure: Intercostal Nerve Block-Multi Levels;  Surgeon: Cherie Dark, MD;  Location: MAIN OR [facility];  Service: Thoracic   ??? PR LARYNGOPLASTY MEDIALIZATION UNLIATERAL Bilateral 04/30/2018    Procedure: LARYNGOPLASTY, MEDIALIZATION, UNILATERAL;  Surgeon: Hardie Pulley, MD;  Location: MAIN OR [facility];  Service: ENT   ??? PR MUSCLE-SKIN FLAP,TRUNK Left 02/06/2018    Procedure: Muscle, Myocutaneous, Or Fasciocutaneous Flap; Trunk;  Surgeon: Cherie Dark, MD;  Location: MAIN OR [facility];  Service: Thoracic   ??? PR REMOVAL OF LUNG Left 02/06/2018    Procedure: REMOVAL OF LUNG, PNEUMONECTOMY;  Surgeon: Cherie Dark, MD;  Location: MAIN OR [facility];  Service: Thoracic   ??? PR THORACOSCOPY W/DX WEDGE RESEXN ANATO LUNG RESEXN Left 04/04/2016    Procedure: ROBOTIC XI THORACOSCOPY, SURGICAL; WITH DIAGNOSTIC WEDGE RESECTION FOLLOWED BY ANATOMIC LUNG RESECTION;  Surgeon: Cherie Dark, MD;  Location: MAIN OR [facility];  Service: Cardiothoracic   ??? PR THORACOTOMY W/THERAPEUTIC WEDGE RESEXN INITIAL Left 06/06/2017    Procedure: Thoracotomy; With Therapeutic Wedge Resection (Eg, Mass, Nodule), Initial;  Surgeon: Cherie Dark, MD;  Location: MAIN OR [facility];  Service: Thoracic   ??? PR THORACOTOMY,LYSE ADHESIONS Left 06/06/2017    Procedure: THORACOTOMY; WITH OPEN INTRAPLEURAL PNEUMONOLYSIS;  Surgeon: Cherie Dark, MD;  Location: MAIN OR [facility];  Service: Thoracic   ??? PR WRIST ARTHROSCOP,RELEASE XVERS LIG Right 02/26/2014    Procedure: ENDOSCOPY WRIST SURG; [employer] LIGAMT;  Surgeon: Lorelee Cover, MD;  Location: [facility] OR [facility];  Service: Orthopedics   ??? TONSILLECTOMY Bilateral 1950's   ??? TUBAL LIGATION Bilateral 1991       MEDICATIONS:  has a current medication list which includes the following prescription(s): acetaminophen, azelastine, cholecalciferol (vitamin d3), citalopram, clonazepam, fluticasone propionate, lactobacillus 3/fos/pantethine, levothyroxine, loperamide, loratadine, losartan, metoprolol succinate, multivitamin, neratinib, olanzapine, and ondansetron.    ALLERGIES:   Allergies   Allergen Reactions   ??? Amlodipine      edema   ??? Codeine Nausea And Vomiting     Nausea and vomiting       SOCIAL HISTORY:  Social History     Social History Narrative    Quit smoking in 1980's. Previously smoked 1 ppd x 12 years.  reports that she quit smoking about 36 years ago. Her smoking use included cigarettes. She has a 12.00 pack-year smoking history. She has never used smokeless tobacco.    FAMILY HISTORY:  Cancer-related family history includes Prostate cancer in her father. There is no history of Cancer.    PHYSICAL EXAMINATION:  Phone visit    PATHOLOGY:  Addendum   - PD-L1 (16X0) IHC has been requested, results to be reported in an addendum.  ??   Addendum electronically signed by Lorayne Bender, MD on 05/14/2016 at 1004   Final Diagnosis   A:  Lung, left upper lobe, wedge resection  - Invasive mucinous adenocarcinoma, see synoptic report for additional information.  ??  B:  Lung, left upper lobe, completion lobectomy   - Invasive mucinous adenocarcinoma, see synoptic report for additional information.  - No tumor seen in 7 lymph nodes (0/7).  ??  C:  Lymph node, level 7, resection  - No tumor seen in multiple lymph node fragments (0/1).  ??  D:  Lymph node, level 5L, resection  - No tumor seen in multiple lymph node fragments (0/1).  ??  E:  Lymph node, level 6L, resection  - No tumor seen in multiple lymph node fragments (0/1).  ??  F:  Lymph node, level 10L, resection  - No tumor seen in multiple lymph node fragments (0/1).  ??  G:  Lymph node, level 11, resection  - No tumor seen in multiple lymph node fragments (0/1).  ??   Electronically signed by Lorayne Bender, MD on 04/10/2016 at 1706   Synoptic Report   LUNG: Resection   Lung - All Specimens   SPECIMEN   Specimen  Lobe(s) of lung   Lobes of Lung  Upper lobe   Procedure  Other (specify): Wedge resection and completion lobectomy   Specimen Laterality  Left   TUMOR   Primary Tumor Site  Upper lobe   Histologic Type  Invasive mucinous adenocarcinoma   Histologic Grade  Not applicable   Tumor Size  Cannot be determined: Transected tumor in specimens A and B (See Comment)   Tumor Focality  Unifocal   Visceral Pleura Invasion  Not identified   Tumor Extension  Not identified   Lymph-Vascular Invasion  Not identified   MARGINS   Bronchial Margin  Uninvolved by invasive carcinoma and carcinoma in situ   Vascular Margin  Involved by invasive carcinoma   Parenchymal Margin  Not applicable: (See Comment)   LYMPH NODES   Regional Lymph Nodes     Number of Lymph Nodes Examined  At least: 12   Lymph Node Involvement  No nodes involved   STAGE (pTNM)   Primary Tumor (pT)  pT2a: Tumor greater than 3 cm, but 5 cm or less in greatest dimension surrounded by lung or visceral pleura without bronchoscopic evidence of invasion more proximal than the lobar bronchus (i.e., not in the main bronchus); or Tumor 5 cm or less in greatest dimension with any of the following features of extent: involves main bronchus, 2 cm or more distal to the carina; invades the visceral pleura; associated with atelectasis or obstructive pneumonitis that extends to the hilar region but does nt involve the entire lung   Regional Lymph Nodes (pN)  pN0: No regional lymph node metastasis   .      Comment        The tumor is present within specimens A and B, and appears to have been transected in  the initial wedge resection, and contiguous with tumor identified in the completion lobectomy.  Because the tumor is present within 2 specimens, the maximum tumor size cannot be determined with certainty; however, the tumor is estimated to be at least pT2a, and likely approaches 5 cm in maximum dimension.  Tumor abuts the staple lines of specimens A and B; however, these staple lines are likely in continuity representing the initial wedge resection, and these likely do not represent a true parenchymal margin.  Frozen section FSB2, the en face staple line at stitch, may also represent tumor at the wedge resection line, and FSB2 does show extensive involvement by tumor in the en face section.  However, this orientation is not clear, and correlation with intraoperative findings is recommended.  ??  In the en face vascular margin of the completion lobectomy (slide B3), there is invasive carcinoma focally present in the perivascular soft tissue adjacent to the vessel, and involvement of the adjacent true stapled margin cannot be excluded. This tissue would be immediately subjacent to the stapled vascular margin, with tumor extending to within at least 2 mm of the true margin, cannot exclude focal involvement by invasive carcinoma.  ??  The diagnosis was called to Dr. Shela Commons. Long on 04/10/16 at 4:00pm by Dr. Katrinka Blazing, and the case was discussed.  ??  The case was also reviewed by Dr. Bo Mcclintock and he concurs with the diagnosis.  ??  The frozen section diagnoses are confirmed.  ??     06/06/17 Wedge resection  Final Diagnosis   A: Lung, left lower lobe, wedge resection   - Ill defined nodule of invasive mucinous adenocarcinoma with lepidic spread and spread through air spaces, spanning 1.6 cm in this wedge biopsy, tumor abutting but not definitively involving pleura, clinically recurrent/metastatic lung adenocarcinoma  - Parenchymal margin free of tumor, tumor is 12 mm from the parenchymal margin   - Two intrapulmonary lymph nodes with no tumor seen (0/2)   ??  B: Lung, left lower lobe, wedge #2  - Three portions of lung parenchyma and pleura with no tumor seen, mild fibrous pleural adhesions present on pleural surface of these specimens     5/19 Pneumonectomy  Final Diagnosis   A: Lung, left lower lobe, lobectomy  - Invasive mucinous adenocarcinoma, 5.5 cm in greatest dimension, clinically recurrent  - Tumor spread through air spaces is identified  - Surgical margins free of tumor  ??  B: Lymph node, 9L, biopsy  - No tumor seen in one lymph node (0/1)  ??     LABS    Results for orders placed or performed in visit on 12/23/18   Comprehensive Metabolic Panel   Result Value Ref Range    Sodium 143 135 - 145 mmol/L    Potassium 4.5 3.5 - 5.0 mmol/L    Chloride 106 98 - 107 mmol/L    Anion Gap 13 7 - 15 mmol/L    CO2 24.0 22.0 - 30.0 mmol/L    BUN 21 7 - 21 mg/dL    Creatinine 1.61 (H) 0.60 - 1.00 mg/dL    BUN/Creatinine Ratio 18     EGFR CKD-EPI Non-African American, Female 49 (L) >=60 mL/min/1.25m2    EGFR CKD-EPI African American, Female 57 (L) >=60 mL/min/1.59m2    Glucose 104 65 - 179 mg/dL    Calcium 09.6 8.5 - 04.5 mg/dL    Albumin 4.7 3.5 - 5.0 g/dL    Total Protein 7.7 6.5 - 8.3 g/dL  Total Bilirubin 0.4 0.0 - 1.2 mg/dL    AST 26 14 - 38 U/L    ALT 19 <35 U/L    Alkaline Phosphatase 101 38 - 126 U/L   Hepatic Function Panel   Result Value Ref Range    Albumin 4.7 3.5 - 5.0 g/dL    Total Protein 7.7 6.5 - 8.3 g/dL    Total Bilirubin 0.4 0.0 - 1.2 mg/dL    Bilirubin, Direct <1.61 0.00 - 0.40 mg/dL    AST 25 14 - 38 U/L    ALT 19 <35 U/L    Alkaline Phosphatase 102 38 - 126 U/L   CBC w/ Differential   Result Value Ref Range    WBC 6.8 4.5 - 11.0 10*9/L    RBC 4.41 4.00 - 5.20 10*12/L    HGB 12.8 (L) 13.5 - 16.0 g/dL    HCT 09.6 04.5 - 40.9 %    MCV 87.1 80.0 - 100.0 fL    MCH 29.1 26.0 - 34.0 pg    MCHC 33.4 31.0 - 37.0 g/dL    RDW 81.1 91.4 - 78.2 %    MPV 6.9 (L) 7.0 - 10.0 fL    Platelet 276 150 - 440 10*9/L    Neutrophils % 76.0 %    Lymphocytes % 10.4 %    Monocytes % 5.4 %    Eosinophils % 5.7 %    Basophils % 0.5 %    Absolute Neutrophils 5.2 2.0 - 7.5 10*9/L    Absolute Lymphocytes 0.7 (L) 1.5 - 5.0 10*9/L    Absolute Monocytes 0.4 0.2 - 0.8 10*9/L    Absolute Eosinophils 0.4 0.0 - 0.4 10*9/L    Absolute Basophils 0.0 0.0 - 0.1 10*9/L    Large Unstained Cells 2 0 - 4 %         RADIOLOGY  Multiple right lung nodular opacities have decreased in size compared to 09/15/2018; reference right apical 0.8 x 0.7 cm nodule (image 27, series 3) was previously 1.2 x 1.1 cm. Medial right basilar 1.3 x 0.7 cm nodule (image 49, series 3) was previously 1.8 x 1 cm. New right basilar 0.5 cm ill-defined nodule (image 57, series 2).    STRATA  ERBB2 p.V842I  CTNNB1 p.S37F     ASSESSMENT AND PLAN: [name] is a 67yoF with history of Stage IB (pT2a pN0 pMx) invasive mucinous adenocarcinoma s/p multiple surgeries, now on neratinib.     Stage IV NSCLC: ERBB2 mutation. Currently on neratinib 200 mg daily (dose-reduced from 240 mg ). Laboratory results were reviewed and found to be acceptable.  Images and imaging reports were reviewed and show disease control.  57m labs and scans ordered.    Diarrhea: Lomotil prescribed.      Nausea: Related to neratinib. Currently taking zofran 30 minutes prior to neratinib administration with modest benefit. Will start zyprexa 5 mg nightly.     She is aware that phone visits can have copays/coinsurance.  >20 min encounter.

## 2018-12-24 DIAGNOSIS — R197 Diarrhea, unspecified: Principal | ICD-10-CM

## 2018-12-24 DIAGNOSIS — C3490 Malignant neoplasm of unspecified part of unspecified bronchus or lung: Secondary | ICD-10-CM | POA: Diagnosis not present

## 2018-12-24 DIAGNOSIS — Z515 Encounter for palliative care: Secondary | ICD-10-CM | POA: Diagnosis not present

## 2018-12-24 NOTE — Unmapped (Signed)
OUTPATIENT ONCOLOGY PALLIATIVE CARE  REACH Palliative Care Study Note-Visit was done via phone on 12/24/18 due to Covid 19 virus. Pt consented for this phone visit.     Assessment: Krista Pratt is a 68 y.o. female with NSCLC, adenocarcinoma, now likely stage IV.   Plan:  1. Symptom Issues:   Diarrhea  -Patient is going to try Lomotil.    2.  Patient???s Illness Understanding/Prognostic Awareness:   -Patient is insightful into her disease process and is proactively speaking to her family to prepare them for when there is a change in her disease process.  -Patient has been practicing social distancing and not even allowing her 2 sons to visit her, due to the fact that they interact with the public.  NP encouraged this practice.    3.  Hopes and Worries:  -Patient is hopeful that this therapy can continue to work.  -She shared that she worries that her spouse will be alone when she dies.    4. Patient Coping Assessment and Needs:  -Patient is proactive in managing her mental health.  She also is a high Loss adjuster, chartered.  She shared with me that she will take advantage of the services that are offered to help her with her family navigate this time in her life.  -Patient has done a review of her life and shared that when the time comes for end-of-life, she feels that she has no regrets thus far in her life and shared that I do not think I will be in any trouble when end-of-life approaches me.  -Patient is requesting that the palliative care team help her understand what symptoms she may encountered toward end-of-life care.  She is requesting that we discussed this when we have a face-to-face meeting.  She also would prefer that this be sometime in the future.  NP shared with patient that I will ensure that in future conversations that we address this need.  -Patient's other request is that we prepare a handout for her spouse for when to call 911 or the ER.  She shared that her spouse prefers concrete guidance and how to manage her care in an objective format.  Patient states that she will start a list and at the next meeting if we could draft a document from our team to her with input.      5. Caregiver Coping Assessment and Needs:  -Caregiver was not available.    6.  Prognostic Information Discussed: yes    7.  End-of-Life Preferences/Planning:  --Healthcare Proxy: Spouse: Krista Pratt  --Code Status:  full  --POLST completed: no    8.  Controlled substances risk management.   - Patient does not have a signed pain medication agreement with our team.          F/u: per protocol   ----------------------------------------    Referring Provider: Dr. Janann August  Oncology Team: Dr. Janann August  PCP: Sherlene Shams, MD      HPI: Presented with symptoms of cough and cold 05/2015, work-up showed inflammation on the bronchoscopy.  Symptoms continued and dx with mid year 2017 with NSCLC that arises from the mucus and had tx with with sx, chemo and xrt. 12/2017 with a recurrence in the lung fields. In May 2019 with another lung sx of a complete left pneumonectomy. 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 July 2019 worked with ENT and has a silcone prosthesis to help with vocalization and this has helped. No difficultly with swallowing.  Feels that she has devoted much energy to having had surgery and knowing that the cancer is in her right lung has been difficult. She was on the wellbutrin, but stopped this and started the celexa with her PCP and this has helped.  She has shared that she had started working with Particia Nearing in January of this year and has found her to be very helpful.     Patient shared with NP that she had restaging scans done yesterday and showed that her current therapy is helping decrease her tumor load.    Current cancer-directed therapy: neratinib    Symptom Review:  Pain: States she has a twinge of pain intermittently on her left chest  Fatigue: Mild  Sleep: Sleeps well  Appetite: Good  Nausea: Controlled for now with the Zyprexa  Bowel function: Reports that her diarrhea varies.  She can have it just daily and then the next day can present with diarrhea every 2 hours.  She is going to start Lomotil tomorrow.  Patient shared that she has tried to see if there is a relationship between what she eats and the diarrhea and she cannot find a pattern.  She needs to use adult diapers.  Dyspnea: Does endorse some shortness of breath with heavy exertion.  It is relieved with rest.   Mobility: Patient states she is able to do the things that she wants to do.  She is independent with her ADLs and IADLs  Mood: She reports her anxiety is managed well with the Celexa and counseling.    Palliative Performance Scale: 90% - Ambulation: Full / Normal Activity, some evidence of disease / Self-Care:Full / Intake: Normal / Level of Conscious: Full    Coping: Pt patient describes herself as not being religious nor spiritual.  She states that she is not afraid of death.  She shared that she is trying to be mellow about this process and she is okay using medications to help her feel this way.  She states that she wants to be at home when she dies and that death doesn't scare her.    Goals of Care: To stay as active and engaged as long as she can.  No desire to prolong her life if she is not able to be alert and engaged.    Social History:   Name of primary support: Her spouse, Krista Pratt, they have been married since 1984.  It is a second marriage for the both of them.  She has a support of her sons.  They have 2 sons together, the oldest one lives in [place] and the other son lives in [place], his name is Krista Pratt and he is an Dentist.  Her third son lives in [place] and this is from her first marriage.  She also reports support from her sister in [place] and a brother and sister in [place].  She also has the support of her girlfriends.   Occupation: She is a retired Adult nurse  Hobbies: Did not ask  Current residence / distance from [facility]: Lives in [place].  40 minutes away.    Advance Care Planning:   HCPOA: Her primary agent is her spouse, Krista Pratt and if he is not available, Krista Pratt is her second surrogate.  Natural surrogate decision maker:  Living Will: Yes, patient states that she will bring this in when she is to follow-up in the hospital.  ACP note:       Opioid Risk Tool: not completed  today  ?? Female  Female    Family history of substance abuse      Alcohol  1  3    Illegal drugs  2  3    Rx drugs  4  4    Personal history of substance abuse      Alcohol  3  3    Illegal drugs  4  4    Rx drugs  5  5    Age between 15???45 years  1  1    History of preadolescent sexual abuse  3  0    Psychological disease      ADD, OCD, bipolar, schizophrenia  2  2    Depression  1  1    ??  Total: na  (<3 low risk, 4-7 moderate risk, >8 high risk)         Adenocarcinoma, lung (CMS-HCC)    04/11/2016 Initial Diagnosis     Adenocarcinoma, lung (CMS-HCC)      02/24/2018 -  Cancer Staged     Staging form: Lung, AJCC 7th Edition  - Pathologic stage from 02/24/2018: Stage IIB (rT3, N0, cM0) - Signed by Cherie Dark, MD on 02/24/2018           Patient Active Problem List   Diagnosis   ??? Allergic state   ??? Dysthymic disorder   ??? Carpal tunnel syndrome   ??? Osteoarthritis of both knees   ??? Endometriosis   ??? GERD (gastroesophageal reflux disease)   ??? Hypertension (RAF-HCC)   ??? Hypothyroidism   ??? Pure hypercholesterolemia   ??? Abnormal chest CT   ??? Mass of upper lobe of left lung   ??? Anxiety and depression   ??? Adenocarcinoma, lung (CMS-HCC)   ??? Allergic rhinitis   ??? Dyspnea   ??? Medicare annual wellness visit, initial   ??? Obesity (BMI 30-39.9)   ??? Right shoulder pain   ??? Screening for breast cancer   ??? On antineoplastic chemotherapy   ??? 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 Oral thrush   ??? Dysphonia   ??? Acute bronchitis   ??? Renal failure   ??? Allergy   ??? Tachycardia   ??? Encounter for Medicare annual wellness exam   ??? Sinusitis, acute, maxillary   ??? Status post L pneumonectomy   ??? Cough in adult   ??? UTI (urinary tract infection)   ??? Adenocarcinoma of lung, stage 1, left (CMS-HCC)   ??? Left carotid bruit   ??? Prediabetes       Past Medical History:   Diagnosis Date   ??? Allergic rhinitis    ??? Cancer (CMS-HCC)     Lung   ??? CTS (carpal tunnel syndrome)    ??? Depression    ??? Glaucoma     left eye   ??? Hypertension    ??? Hypothyroidism    ??? Osteoarthritis        Past Surgical History:   Procedure Laterality Date   ??? APPENDECTOMY N/A 1963    ruptured w. complications   ??? CARPAL TUNNEL RELEASE Right 03/01/14   ??? EXPLORATORY LAPAROTOMY N/A 1989   ??? NASAL SINUS SURGERY N/A 2013   ??? PR BRNCHSC EBUS GUIDED SAMPL 1/2 NODE STATION/STRUX N/A 01/06/2018    Procedure: Bronch, Rigid Or Flexible, Inc Fluoro Guidance, When Performed; With Ebus Guided Transtracheal And/Or Transbronchial Sampling, One Or Two Mediastinal And/Or Hilar Lymph Node Stations Or Structures;  Surgeon: Mercy Moore, MD;  Location: MAIN [facility];  Service: Pulmonary   ??? PR BRONCHOSCOPY W/THER ASPIR TRACHBRNCL TREE 1ST N/A 06/06/2017    Procedure: Bronchoscpy, Rigid Or Flexible, W/Fluoro; With Therapeutic Aspiration Of Tracheobronchial Tree, Initial;  Surgeon: Cherie Dark, MD;  Location: MAIN OR [facility];  Service: Thoracic   ??? PR BRONCHOSCOPY W/THER ASPIR TRACHBRNCL TREE 1ST N/A 02/06/2018    Procedure: Bronchoscpy, Rigid Or Flexible, W/Fluoro; With Therapeutic Aspiration Of Tracheobronchial Tree, Initial;  Surgeon: Cherie Dark, MD;  Location: MAIN OR [facility];  Service: Thoracic   ??? PR BRONCHOSCOPY,COMPUTER ASSIST/IMAGE-GUIDED NAVIGATION N/A 08/31/2015    Procedure: Bronchoscopy, Flexible, Include Fluoro When Performed; W/Computer-Assist, Image-Guided Navigation;  Surgeon: Jerelyn Charles, MD;  Location: MAIN OR [facility];  Service: Pulmonary   ??? PR BRONCHOSCOPY,DIAGNOSTIC W LAVAGE Left 08/31/2015    Procedure: Bronchoscopy, Flexible, Include Fluoroscopic Guidance When Performed; W/Bronchial Alveolar Lavage; Surgeon: Jerelyn Charles, MD;  Location: MAIN OR [facility];  Service: Pulmonary   ??? PR BRONCHOSCOPY,TRANSBRONCH BIOPSY Left 08/31/2015    Procedure: Bronchoscopy, Flexible, Include Fluoro Guidance When Performed; W/Transbronchial Lung Bx, Single Lobe;  Surgeon: Jerelyn Charles, MD;  Location: MAIN OR [facility];  Service: Pulmonary   ??? PR DECORTICATION,PULMONARY,TOTAL Left 02/06/2018    Procedure: Decortic Pulm (Separt Proc); Tot;  Surgeon: Cherie Dark, MD;  Location: MAIN OR [facility];  Service: Thoracic   ??? PR EXCIS INTERDIGITAL NEUROMA,EA Left 02/01/2015    Procedure: EXC INTERDIGITAL NEUROMA SNGL EA;  Surgeon: Christena Deem, MD;  Location: MAIN OR [facility];  Service: Orthopedics   ??? PR INJECT NERV BLCK,INTERCOST,MULTPL Left 06/06/2017    Procedure: Intercostal Nerve Block-Multi Levels;  Surgeon: Cherie Dark, MD;  Location: MAIN OR [facility];  Service: Thoracic   ??? PR LARYNGOPLASTY MEDIALIZATION UNLIATERAL Bilateral 04/30/2018    Procedure: LARYNGOPLASTY, MEDIALIZATION, UNILATERAL;  Surgeon: Hardie Pulley, MD;  Location: MAIN OR [facility];  Service: ENT   ??? PR MUSCLE-SKIN FLAP,TRUNK Left 02/06/2018    Procedure: Muscle, Myocutaneous, Or Fasciocutaneous Flap; Trunk;  Surgeon: Cherie Dark, MD;  Location: MAIN OR [facility];  Service: Thoracic   ??? PR REMOVAL OF LUNG Left 02/06/2018    Procedure: REMOVAL OF LUNG, PNEUMONECTOMY;  Surgeon: Cherie Dark, MD;  Location: MAIN OR [facility];  Service: Thoracic   ??? PR THORACOSCOPY W/DX WEDGE RESEXN ANATO LUNG RESEXN Left 04/04/2016    Procedure: ROBOTIC XI THORACOSCOPY, SURGICAL; WITH DIAGNOSTIC WEDGE RESECTION FOLLOWED BY ANATOMIC LUNG RESECTION;  Surgeon: Cherie Dark, MD;  Location: MAIN OR [facility];  Service: Cardiothoracic   ??? PR THORACOTOMY W/THERAPEUTIC WEDGE RESEXN INITIAL Left 06/06/2017    Procedure: Thoracotomy; With Therapeutic Wedge Resection (Eg, Mass, Nodule), Initial;  Surgeon: Cherie Dark, MD;  Location: MAIN OR [facility];  Service: Thoracic   ??? PR THORACOTOMY,LYSE ADHESIONS Left 06/06/2017    Procedure: THORACOTOMY; WITH OPEN INTRAPLEURAL PNEUMONOLYSIS;  Surgeon: Cherie Dark, MD;  Location: MAIN OR [facility];  Service: Thoracic   ??? PR WRIST ARTHROSCOP,RELEASE XVERS LIG Right 02/26/2014    Procedure: ENDOSCOPY WRIST SURG; [employer] TRANSVER LIGAMT;  Surgeon: Lorelee Cover, MD;  Location: ASC OR [facility];  Service: Orthopedics   ??? TONSILLECTOMY Bilateral 1950's   ??? TUBAL LIGATION Bilateral 1991       Current Outpatient Medications   Medication Sig Dispense Refill   ??? acetaminophen (TYLENOL) 325 MG tablet Take 2 tablets (650 mg total) by mouth every four (4) hours as needed.  0   ??? azelastine (ASTELIN) 137 mcg nasal spray 1 spray by Each Nare  route nightly. Use in each nostril as directed     ??? cholecalciferol, vitamin D3, (VITAMIN D3 ORAL) Take 1,000 Units by mouth daily.      ??? citalopram (CELEXA) 20 MG tablet Take 20 mg by mouth daily.     ??? clonazePAM (KLONOPIN) 0.5 MG tablet Take 0.5 mg by mouth two (2) times a day as needed.      ??? diphenoxylate-atropine (LOMOTIL) 2.5-0.025 mg per tablet Take 1 tablet by mouth 4 (four) times a day as needed for diarrhea. 120 tablet 1   ??? fluticasone (FLONASE) 50 mcg/actuation nasal spray 1 spray by Each Nare route daily as needed.      ??? LACTOBAC CMB #3/FOS/PANTETHINE (PROBIOTIC & ACIDOPHILUS ORAL) Take 1 packet by mouth daily at 0600.      ??? levothyroxine (SYNTHROID, LEVOTHROID) 50 MCG tablet Take 50 mcg by mouth daily at 0600.     ??? loperamide (IMODIUM A-D) 2 mg tablet 4 mg TID for 2 weeks, then 4 mg BID for 6 weeks, then 4 mg PRN after. 120 tablet 2   ??? loratadine (CLARITIN) 10 mg tablet Take 10 mg by mouth daily as needed.      ??? losartan (COZAAR) 100 MG tablet Take 100 mg by mouth once daily.  3   ??? metoprolol succinate (TOPROL-XL) 50 MG 24 hr tablet Take 75 mg by mouth daily.      ??? multivitamin (MULTIVITAMIN) per tablet Take 1 tablet by mouth daily.      ??? neratinib (NERLYNX) 40 mg tablet Take 5 tablets (200 mg total) by mouth daily. Take with food. 150 tablet 11   ??? OLANZapine (ZYPREXA) 5 MG tablet Take 1 tablet (5 mg total) by mouth nightly. 90 tablet 3   ??? ondansetron (ZOFRAN) 8 MG tablet Take 1 tablet (8 mg total) by mouth every eight (8) hours as needed for nausea. 30 tablet 2     No current facility-administered medications for this visit.        Allergies:   Allergies   Allergen Reactions   ??? Amlodipine      edema   ??? Codeine Nausea And Vomiting     Nausea and vomiting       Family History:  Cancer-related family history includes Prostate cancer in her father. There is no history of Cancer.  She indicated that the status of her mother is unknown. She indicated that the status of her father is unknown. She indicated that the status of her sister is unknown. She indicated that the status of her brother is unknown. She indicated that the status of her maternal grandmother is unknown. She indicated that the status of her maternal grandfather is unknown. She indicated that the status of her paternal grandmother is unknown. She indicated that the status of her paternal grandfather is unknown. She indicated that the status of her maternal aunt is unknown. She indicated that the status of her maternal uncle is unknown. She indicated that the status of her paternal aunt is unknown. She indicated that the status of her paternal uncle is unknown. She indicated that the status of her neg hx is unknown.      REVIEW OF SYSTEMS:  A comprehensive review of 10 systems was negative except for pertinent positives noted in HPI.    PHYSICAL EXAM:  ++TeleHealth visit - physical exam not performed      Lab Results   Component Value Date    CREATININE 1.14 (H) 12/23/2018  Lab Results   Component Value Date    ALKPHOS 101 12/23/2018    ALKPHOS 102 12/23/2018    BILITOT 0.4 12/23/2018    BILITOT 0.4 12/23/2018    BILIDIR <0.10 12/23/2018    PROT 7.7 12/23/2018    PROT 7.7 12/23/2018    ALBUMIN 4.7 12/23/2018    ALBUMIN 4.7 12/23/2018    ALT 19 12/23/2018    ALT 19 12/23/2018    AST 26 12/23/2018    AST 25 12/23/2018         I personally spent over half of a total 53 minutes in counseling and discussion with the patient as described above.    Pam Drown, FNP-BC, [facility]  Outpatient Oncology Palliative Care Service  [facility]  [address] [place], [place] [address]  [phone]    This patient visit was completed through the use of an audio/video or telephone encounter.      This patient encounter is appropriate and reasonable under the circumstances given the patient's particular presentation at this time. The patient has been advised of the potential risks and limitations of this mode of treatment (including, but not limited to, the absence of in-person examination) and has agreed to be treated in a remote fashion in spite of them. Any and all of the patient's/patient's family's questions on this issue have been answered.     The patient has also been advised to contact this office for worsening conditions or problems, and seek emergency medical treatment and/or call 911 if the patient deems either necessary.    I was the supervising physician in the delivery of the service. Hilda Blades, MD

## 2018-12-26 NOTE — Unmapped (Signed)
[facility]   Comprehensive Cancer Support Program       THIS IS A [name]      Service Date: December 17, 2018    Location of patient:  Patient's home    Consulting Provider:  Perry Mount, [place]    Reason for contact:  Depression and Anxiety      Krista Pratt is a 68 yo woman with stage IV NSCLC initially diagnosed in 03/2016. Pt is s/p lobectomy LUL and mediastinal LND in 03/2016 and adjuvant chemotherapy and XRT completed in 10/2016. Local regional recurrence found in 03/2017, s/p resection in 06/2017. Imaging in 11/2017 concerning for recurrence and FNA in 03/2018 revealed atypical epithelial cells, s/p pneumonectomy in 01/2018. In 08/2018 routine surveillance imaging detected new right lung pulmonary nodules concerning for metastatic disease versus possible pneumonitis / pneumonia process.  Following course of antibiotics, 09/16/2018 repeat PET CT showed increase in size in multiple right lung nodules concerning for metastatic disease, currently treated with neratinib.     [name] reported long h/o of depression for which she has taken Wellbutrin managed by her PCP. She began to feel that the Wellbutrin might be contributing to an increase in anxiety and she is following her PCP???s instructions in transitioning from Wellbutrin to Celexa and feels that Celexa is helping her mood. She is also prescribed clonazepam for anxiety. She continues Zyprexa for nausea and has also found it helpful for her mood and sleep. She feels this has helped her have difficult conversations with her family about her diagnosis and treatment. Pt reports that she is ???in a good place??? at present. Admitted some anxiety about upcoming scans that she expects will reveal whether her disease is responding to neratinib. There were no concerns for pt safety identified during the interview.     Provided active listening and normalized pt's thoughts and feelings in her present circumstance. Provided supportive counseling. [name] desires ongoing supportive counseling and a follow-up telephone appointment was scheduled for 3/26. She has my contact information.     Time spent: 40 minutes    Perry Mount, [place]  Counselor  Comprehensive Cancer Support Program  [phone]    THIS IS A NON-BILLABLE ENCOUNTER

## 2018-12-29 NOTE — Unmapped (Signed)
[facility]   Comprehensive Cancer Support Program   TELEPHONE  ENCOUNTER    THIS IS A [name]      Service Date: December 25, 2018    Location of patient:  Patient's home    Consulting Provider:  Perry Mount, [place]    Reason for contact:  Depression and Anxiety      [name] is a 68 yo woman with stage IV NSCLC initially diagnosed in 03/2016. Pt is s/p lobectomy LUL and mediastinal LND in 03/2016 and adjuvant chemotherapy and XRT completed in 10/2016. Local regional recurrence found in 03/2017, s/p resection in 06/2017. Imaging in 11/2017 concerning for recurrence and FNA in 03/2018 revealed atypical epithelial cells, s/p pneumonectomy in 01/2018. In 08/2018 routine surveillance imaging detected new right lung pulmonary nodules concerning for metastatic disease versus possible pneumonitis / pneumonia process.  Following course of antibiotics, 09/16/2018 repeat PET CT showed increase in size in multiple right lung nodules concerning for metastatic disease, currently treated with neratinib.     [name] reported long h/o of depression for which she has taken Wellbutrin managed by her PCP. She began to feel that the Wellbutrin might be contributing to an increase in anxiety and she is following her PCP???s instructions in transitioning from Wellbutrin to Celexa and feels that Celexa is helping her mood. She is also prescribed clonazepam for anxiety. She continues Zyprexa for nausea and has also found it helpful for her mood and sleep.     Pt had appointment with Dr. Janann August on 3/24 and was relieved to learn recent imaging revealed interval decrease in size of multiple right lung nodules compared to imaging 08/2018. Side effects of neratinib are improving. She continues to feel her mood is ???in a good place???.  There were no concerns for pt safety identified during the interview.     Provided active listening and normalized pt's thoughts and feelings in her present circumstance. Provided supportive counseling. [name] desires ongoing supportive counseling and a follow-up telephone appointment was scheduled for 4/9. She has my contact information and will contact me if she wishes to move appointment up.      Time spent: 40 minutes    Perry Mount, [place]  Counselor  Comprehensive Cancer Support Program  [phone]    THIS IS A NON-BILLABLE ENCOUNTER

## 2019-01-01 NOTE — Unmapped (Signed)
Per 04/02 inbasket to schduled CT/Lab in [facility] with phone visit with MD on 06/23. Left voicemail for Pt to return call.

## 2019-01-08 ENCOUNTER — Telehealth: Admit: 2019-01-08 | Discharge: 2019-01-09 | Payer: MEDICARE

## 2019-01-08 NOTE — Unmapped (Signed)
[facility]  Comprehensive Cancer Support Program  Video Visit  New / Established Patient       THIS IS A NON-BILLABLE ENCOUNTER      Encounter Description: This encounter was conducted via video visit in the setting of State of Emergency due to COVID-19 Pandemic. Krista Pratt was located in [place]. Patient interviewed in a private place in her home.    Assessment:  Krista Pratt is a 68 yo woman with stage IV NSCLC initially diagnosed in 03/2016. Pt is s/p lobectomy LUL and mediastinal LND in 03/2016 and adjuvant chemotherapy and XRT completed in 10/2016. Local regional recurrence found in 03/2017, s/p resection in 06/2017. Imaging in 11/2017 concerning for recurrence and FNA in 03/2018 revealed atypical epithelial cells, s/p pneumonectomy in 01/2018. In 08/2018 routine surveillance imaging detected new right lung pulmonary nodules concerning for metastatic disease versus possible pneumonitis / pneumonia process.  Following course of antibiotics, 09/16/2018 repeat PET CT showed increase in size in multiple right lung nodules concerning for metastatic disease, currently treated with neratinib. [name] is an established patient with [facility] and is participating in telehealth follow-up by video conferencing.      Risk Assessment:  There were no concerns for pt safety identified during the interview.      While future psychiatric events cannot be accurately predicted, the patient does not currently require acute inpatient psychiatric care and does not currently meet [facility] involuntary commitment criteria.     Plan:  Pt has my contact information. Continuing supportive counseling was offered and a follow-up appointment is scheduled for 4/23.      Subjective:     Krista Pratt reported long h/o of depression for which she has taken Wellbutrin managed by her PCP. She began to feel that the Wellbutrin might be contributing to an increase in anxiety and she is following her PCP???s instructions in transitioning from Wellbutrin to Celexa and feels that Celexa is helping her mood. She is also prescribed clonazepam for anxiety. She was taking Zyprexa for nausea and also found it helpful for her mood and sleep but she has recently discontinued because she felt hungover the next day. We discussed whether she noticed a change in her mood after discontinuing. [name]   ??  Provided active listening and normalized pt's thoughts and feelings in her present circumstance. Provided supportive counseling. Krista Pratt desires ongoing supportive counseling and a follow-up telehealth appointment was scheduled for 4/23. She has my contact information and will contact me if she wishes to move appointment up.??      Objective:    Mental Status Exam:  Appearance:    Appears stated age, Well nourished and Well developed   Motor:   No abnormal movements   Speech/Language:    Normal rate, volume, tone, fluency   Mood:   unmotivated   Affect:   Calm and Cooperative   Thought process and Associations:   Logical, linear, clear, coherent, goal directed   Abnormal/psychotic thought content:     Denies SI, HI, self harm   Perceptual disturbances:     Does not endorse auditory or visual hallucinations     Orientation:   Oriented to person, place, time, and general circumstances   Attention and Concentration:   Able to fully concentrate and attend   Memory:   Immediate, short-term, long-term, and recall grossly intact    Fund of knowledge:    Consistent with level of education and development   Insight:  Intact   Judgment:    Intact   Impulse Control:   Intact     Time spent: 35 minutes    Perry Mount, MS  Counselor  Comprehensive Cancer Support Program  [phone] / [phone]

## 2019-01-09 NOTE — Unmapped (Signed)
Pt is aware of PHONE visit on 01/12/2019.  meds were reviewed

## 2019-01-12 ENCOUNTER — Institutional Professional Consult (permissible substitution): Admit: 2019-01-12 | Discharge: 2019-01-13 | Payer: MEDICARE

## 2019-01-12 DIAGNOSIS — R197 Diarrhea, unspecified: Secondary | ICD-10-CM | POA: Diagnosis not present

## 2019-01-12 DIAGNOSIS — I10 Essential (primary) hypertension: Secondary | ICD-10-CM | POA: Diagnosis not present

## 2019-01-12 DIAGNOSIS — C3490 Malignant neoplasm of unspecified part of unspecified bronchus or lung: Secondary | ICD-10-CM | POA: Diagnosis not present

## 2019-01-12 NOTE — Unmapped (Signed)
OUTPATIENT ONCOLOGY PALLIATIVE CARE  REACH Palliative Care Study Note-Visit was done via phone on 01/12/19 due to Covid 19 virus. Pt consented for this phone visit.     Assessment: Krista Pratt is a 68 y.o. female with NSCLC, adenocarcinoma, now likely stage IV.   Plan:  1. Symptom Issues:   Diarrhea  -Overall controlled with imodium and using usually about 3 per day.       2.  Patient???s Illness Understanding/Prognostic Awareness:   -not addressed today    -At prior visits: patient is insightful into her disease process and is proactively speaking to her family to prepare them for when there is a change in her disease process.  -Patient has been practicing social distancing and not even allowing her 2 sons to visit her, due to the fact that they interact with the public.  NP encouraged this practice.    3.  Hopes and Worries:  -Concerned that she may not be drinking enough fluid and that it may affect her kidney function.  She is looking forward to getting her labs checked next week to see if her creatinine remains stable.    -At prior visits: Patient is hopeful that this therapy can continue to work.  -She shared that she worries that her spouse will be alone when she dies.    4. Patient Coping Assessment and Needs:    -Remains active with Particia Nearing, counselor.  She is staying busy with reading and sewing.  Verbalizing that she is tired of being at home, but has been able to be engaged.    -At prior visits: Patient is proactive in managing her mental health.  She also is a high Loss adjuster, chartered.  She shared with me that she will take advantage of the services that are offered to help her with her family navigate this time in her life.  -Patient has done a review of her life and shared that when the time comes for end-of-life, she feels that she has no regrets thus far in her life and shared that I do not think I will be in any trouble when end-of-life approaches me.  -Patient is requesting that the palliative care team help her understand what symptoms she may encountered toward end-of-life care.  She is requesting that we discussed this when we have a face-to-face meeting.  She also would prefer that this be sometime in the future.  NP shared with patient that I will ensure that in future conversations that we address this need.  -Patient's other request is that we prepare a handout for her spouse for when to call 911 or the ER.  She shared that her spouse prefers concrete guidance and how to manage her care in an objective format.  Patient states that she will start a list and at the next meeting if we could draft a document from our team to her with input.      5. Caregiver Coping Assessment and Needs:  -Caregiver was not available.    6.  Prognostic Information Discussed: Not today  At prior visits: yes    7.  End-of-Life Preferences/Planning:  --Healthcare Proxy: Spouse: Molly [place]uro [place]  --Code Status:  full  --POLST completed: no    8.  Controlled substances risk management.   - Patient does not have a signed pain medication agreement with our team.          F/u: per protocol   ----------------------------------------    Referring Provider: Dr. Janann August  Oncology  Team: Dr. Janann August  PCP: Sherlene Shams, MD      HPI: Presented with symptoms of cough and cold 05/2015, work-up showed inflammation on the bronchoscopy.  Symptoms continued and dx with mid year 2017 with NSCLC that arises from the mucus and had tx with with sx, chemo and xrt. 12/2017 with a recurrence in the lung fields. In May 2019 with another lung sx of a complete left pneumonectomy. 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 July 2019 worked with ENT and has a silcone prosthesis to help with vocalization and this has helped. No difficultly with swallowing. Feels that she has devoted much energy to having had surgery and knowing that the cancer is in her right lung has been difficult. She was on the wellbutrin, but stopped this and started the celexa with her PCP and this has helped.  She has shared that she had started working with Particia Nearing in January of this year and has found her to be very helpful.     Patient shared with NP that she had restaging scans done yesterday and showed that her current therapy is helping decrease her tumor load.    Current cancer-directed therapy: neratinib    Interval history, 01/12/2019: CK  Patient reporting overall she is weathering well.  She shares that she is tired of being at home.  She misses going to the grocery store. Has been focusing on trying to drink enough fluid.  She has canceled her trips for the month of June and July and shares some sadness and not being able to take advantage of this precious time where she does have the energy and feels well enough to travel, but events are not allowing her to do so.       Symptom Review:  Fatigue: Good  Sleep: Sleeps well  Appetite: Good, trying to eat bland food  Nausea: Her nausea is much better and she is no longer using the Zyprexa.  She had some sedation with the Zyprexa and found that she does not really need it when she stopped taking it.  Bowel function: Continues to have the water-like stools predominantly at at bedtime.  She reports that most days are bowel movements of 3 to 4/day and that is taking 3 Imodium per day.  She does have days when she averages 8-9 stools a day.  She reports no incontinence at at bedtime.  Dyspnea: Her breathing is at its baseline not appreciating any worsening shortness of breath with exertion..   Mobility: She remains independent with her ADLs and IADLs  Mood: Stable.    Palliative Performance Scale: 90% - Ambulation: Full / Normal Activity, some evidence of disease / Self-Care:Full / Intake: Normal / Level of Conscious: Full    Coping:     At prior visits: Pt patient describes herself as not being religious nor spiritual.  She states that she is not afraid of death.  She shared that she is trying to be mellow about this process and she is okay using medications to help her feel this way.  She states that she wants to be at home when she dies and that death doesn't scare her.    Goals of Care: To stay as active and engaged as long as she can.  No desire to prolong her life if she is not able to be alert and engaged.    Social History:   Name of primary support: Her spouse, Robert [place], they have been married since  1984.  It is a second marriage for the both of them.  She has a support of her sons.  They have 2 sons together, the oldest one lives in [place] and the other son lives in [place], his name is [name] and he is an Dentist.  Her third son lives in [place] and this is from her first marriage.  She also reports support from her sister in [place] and a brother and sister in [place].  She also has the support of her girlfriends.   Occupation: She is a retired Adult nurse  Hobbies: Did not ask  Current residence / distance from [facility]: Lives in [place].  40 minutes away.    Advance Care Planning:   HCPOA: Her primary agent is her spouse, Robert [place] and if he is not available, [name] is her second surrogate.  Natural surrogate decision maker:  Living Will: Yes, patient states that she will bring this in when she is to follow-up in the hospital.  ACP note:       Opioid Risk Tool: not completed today  ?? Female  Female    Family history of substance abuse      Alcohol  1  3    Illegal drugs  2  3    Rx drugs  4  4    Personal history of substance abuse      Alcohol  3  3    Illegal drugs  4  4    Rx drugs  5  5    Age between 34???45 years  1  1    History of preadolescent sexual abuse  3  0    Psychological disease      ADD, OCD, bipolar, schizophrenia  2  2    Depression  1  1    ??  Total: na  (<3 low risk, 4-7 moderate risk, >8 high risk)         Adenocarcinoma, lung (CMS-HCC)    04/11/2016 Initial Diagnosis     Adenocarcinoma, lung (CMS-HCC)      02/24/2018 -  Cancer Staged     Staging form: Lung, AJCC 7th Edition  - Pathologic stage from 02/24/2018: Stage IIB (rT3, N0, cM0) - Signed by Cherie Dark, MD on 02/24/2018           Patient Active Problem List   Diagnosis   ??? Allergic state   ??? Dysthymic disorder   ??? Carpal tunnel syndrome   ??? Osteoarthritis of both knees   ??? Endometriosis   ??? GERD (gastroesophageal reflux disease)   ??? Hypertension (RAF-HCC)   ??? Hypothyroidism   ??? Pure hypercholesterolemia   ??? Abnormal chest CT   ??? Mass of upper lobe of left lung   ??? Anxiety and depression   ??? Adenocarcinoma, lung (CMS-HCC)   ??? Allergic rhinitis   ??? Dyspnea   ??? Medicare annual wellness visit, initial   ??? Obesity (BMI 30-39.9)   ??? Right shoulder pain   ??? Screening for breast cancer   ??? On antineoplastic chemotherapy   ??? 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 Oral thrush   ??? Dysphonia   ??? Acute bronchitis   ??? Renal failure   ??? Allergy   ??? Tachycardia   ??? Encounter for Medicare annual wellness exam   ??? Sinusitis, acute, maxillary   ??? Status post L pneumonectomy   ??? Cough in adult   ??? UTI (urinary tract infection)   ???  Adenocarcinoma of lung, stage 1, left (CMS-HCC)   ??? Left carotid bruit   ??? Prediabetes       Past Medical History:   Diagnosis Date   ??? Allergic rhinitis    ??? Cancer (CMS-HCC)     Lung   ??? CTS (carpal tunnel syndrome)    ??? Depression    ??? Glaucoma     left eye   ??? Hypertension    ??? Hypothyroidism    ??? Osteoarthritis        Past Surgical History:   Procedure Laterality Date   ??? APPENDECTOMY N/A 1963    ruptured w. complications   ??? CARPAL TUNNEL RELEASE Right 03/01/14   ??? EXPLORATORY LAPAROTOMY N/A 1989   ??? NASAL SINUS SURGERY N/A 2013   ??? PR BRNCHSC EBUS GUIDED SAMPL 1/2 NODE STATION/STRUX N/A 01/06/2018    Procedure: Bronch, Rigid Or Flexible, Inc Fluoro Guidance, When Performed; With Ebus Guided Transtracheal And/Or Transbronchial Sampling, One Or Two Mediastinal And/Or Hilar Lymph Node Stations Or Structures;  Surgeon: Mercy Moore, MD;  Location: MAIN OR [facility];  Service: Pulmonary   ??? PR BRONCHOSCOPY W/THER ASPIR TRACHBRNCL TREE 1ST N/A 06/06/2017    Procedure: Bronchoscpy, Rigid Or Flexible, W/Fluoro; With Therapeutic Aspiration Of Tracheobronchial Tree, Initial;  Surgeon: Cherie Dark, MD;  Location: MAIN [facility];  Service: Thoracic   ??? PR BRONCHOSCOPY W/THER ASPIR TRACHBRNCL TREE 1ST N/A 02/06/2018    Procedure: Bronchoscpy, Rigid Or Flexible, W/Fluoro; With Therapeutic Aspiration Of Tracheobronchial Tree, Initial;  Surgeon: Cherie Dark, MD;  Location: MAIN OR [facility];  Service: Thoracic   ??? PR BRONCHOSCOPY,COMPUTER ASSIST/IMAGE-GUIDED NAVIGATION N/A 08/31/2015    Procedure: Bronchoscopy, Flexible, Include Fluoro When Performed; W/Computer-Assist, Image-Guided Navigation;  Surgeon: Jerelyn Charles, MD;  Location: MAIN OR [facility];  Service: Pulmonary   ??? PR BRONCHOSCOPY,DIAGNOSTIC W LAVAGE Left 08/31/2015    Procedure: Bronchoscopy, Flexible, Include Fluoroscopic Guidance When Performed; W/Bronchial Alveolar Lavage;  Surgeon: Jerelyn Charles, MD;  Location: MAIN OR [facility];  Service: Pulmonary   ??? PR BRONCHOSCOPY,TRANSBRONCH BIOPSY Left 08/31/2015    Procedure: Bronchoscopy, Flexible, Include Fluoro Guidance When Performed; W/Transbronchial Lung Bx, Single Lobe;  Surgeon: Jerelyn Charles, MD;  Location: MAIN OR [facility];  Service: Pulmonary   ??? PR DECORTICATION,PULMONARY,TOTAL Left 02/06/2018    Procedure: Decortic Pulm (Separt Proc); Tot;  Surgeon: Cherie Dark, MD;  Location: MAIN OR [facility];  Service: Thoracic   ??? PR EXCIS INTERDIGITAL NEUROMA,EA Left 02/01/2015    Procedure: EXC INTERDIGITAL NEUROMA SNGL EA;  Surgeon: Christena Deem, MD;  Location: MAIN OR [facility];  Service: Orthopedics   ??? PR INJECT NERV BLCK,INTERCOST,MULTPL Left 06/06/2017    Procedure: Intercostal Nerve Block-Multi Levels;  Surgeon: Cherie Dark, MD;  Location: MAIN OR [facility];  Service: Thoracic   ??? PR LARYNGOPLASTY MEDIALIZATION UNLIATERAL Bilateral 04/30/2018    Procedure: LARYNGOPLASTY, MEDIALIZATION, UNILATERAL;  Surgeon: Hardie Pulley, MD;  Location: MAIN OR [facility];  Service: ENT   ??? PR MUSCLE-SKIN FLAP,TRUNK Left 02/06/2018    Procedure: Muscle, Myocutaneous, Or Fasciocutaneous Flap; Trunk;  Surgeon: Cherie Dark, MD;  Location: MAIN OR [facility];  Service: Thoracic   ??? PR REMOVAL OF LUNG Left 02/06/2018    Procedure: REMOVAL OF LUNG, PNEUMONECTOMY;  Surgeon: Cherie Dark, MD;  Location: MAIN OR [facility];  Service: Thoracic   ??? PR THORACOSCOPY W/DX WEDGE RESEXN ANATO LUNG RESEXN Left 04/04/2016    Procedure: ROBOTIC XI THORACOSCOPY, SURGICAL; WITH DIAGNOSTIC WEDGE RESECTION FOLLOWED BY ANATOMIC  LUNG RESECTION;  Surgeon: Cherie Dark, MD;  Location: MAIN OR [facility];  Service: Cardiothoracic   ??? PR THORACOTOMY W/THERAPEUTIC WEDGE RESEXN INITIAL Left 06/06/2017    Procedure: Thoracotomy; With Therapeutic Wedge Resection (Eg, Mass, Nodule), Initial;  Surgeon: Cherie Dark, MD;  Location: MAIN OR [facility];  Service: Thoracic   ??? PR THORACOTOMY,LYSE ADHESIONS Left 06/06/2017    Procedure: THORACOTOMY; WITH OPEN INTRAPLEURAL PNEUMONOLYSIS;  Surgeon: Cherie Dark, MD;  Location: MAIN OR [facility];  Service: Thoracic   ??? PR WRIST ARTHROSCOP,RELEASE XVERS LIG Right 02/26/2014    Procedure: ENDOSCOPY WRIST SURG; [employer] TRANSVER LIGAMT;  Surgeon: Lorelee Cover, MD;  Location: ASC OR [facility]  Service: Orthopedics   ??? TONSILLECTOMY Bilateral 1950's   ??? TUBAL LIGATION Bilateral 1991       Current Outpatient Medications   Medication Sig Dispense Refill   ??? acetaminophen (TYLENOL) 325 MG tablet Take 2 tablets (650 mg total) by mouth every four (4) hours as needed.  0   ??? azelastine (ASTELIN) 137 mcg nasal spray 1 spray by Each Nare route nightly. Use in each nostril as directed     ??? cholecalciferol, vitamin D3, (VITAMIN D3 ORAL) Take 1,000 Units by mouth daily.      ??? citalopram (CELEXA) 20 MG tablet Take 20 mg by mouth daily.     ??? clonazePAM (KLONOPIN) 0.5 MG tablet Take 0.5 mg by mouth two (2) times a day as needed.      ??? diphenoxylate-atropine (LOMOTIL) 2.5-0.025 mg per tablet Take 1 tablet by mouth 4 (four) times a day as needed for diarrhea. 120 tablet 1   ??? fluticasone (FLONASE) 50 mcg/actuation nasal spray 1 spray by Each Nare route daily as needed.      ??? LACTOBAC CMB #3/FOS/PANTETHINE (PROBIOTIC & ACIDOPHILUS ORAL) Take 1 packet by mouth daily at 0600.      ??? levothyroxine (SYNTHROID, LEVOTHROID) 50 MCG tablet Take 50 mcg by mouth daily at 0600.     ??? loperamide (IMODIUM A-D) 2 mg tablet 4 mg TID for 2 weeks, then 4 mg BID for 6 weeks, then 4 mg PRN after. 120 tablet 2   ??? loratadine (CLARITIN) 10 mg tablet Take 10 mg by mouth daily as needed.      ??? losartan (COZAAR) 100 MG tablet Take 100 mg by mouth once daily.  3   ??? metoprolol succinate (TOPROL-XL) 50 MG 24 hr tablet Take 75 mg by mouth daily.      ??? multivitamin (MULTIVITAMIN) per tablet Take 1 tablet by mouth daily.      ??? neratinib (NERLYNX) 40 mg tablet Take 5 tablets (200 mg total) by mouth daily. Take with food. 150 tablet 11   ??? OLANZapine (ZYPREXA) 5 MG tablet Take 1 tablet (5 mg total) by mouth nightly. 90 tablet 3   ??? ondansetron (ZOFRAN) 8 MG tablet Take 1 tablet (8 mg total) by mouth every eight (8) hours as needed for nausea. 30 tablet 2     No current facility-administered medications for this visit.        Allergies:   Allergies   Allergen Reactions   ??? Amlodipine      edema   ??? Codeine Nausea And Vomiting     Nausea and vomiting       Family History:  Cancer-related family history includes Prostate cancer in her father. There is no history of Cancer.  She indicated that the status of her mother is unknown. She indicated that the  status of her father is unknown. She indicated that the status of her sister is unknown. She indicated that the status of her brother is unknown. She indicated that the status of her maternal grandmother is unknown. She indicated that the status of her maternal grandfather is unknown. She indicated that the status of her paternal grandmother is unknown. She indicated that the status of her paternal grandfather is unknown. She indicated that the status of her maternal aunt is unknown. She indicated that the status of her maternal uncle is unknown. She indicated that the status of her paternal aunt is unknown. She indicated that the status of her paternal uncle is unknown. She indicated that the status of her neg hx is unknown.      REVIEW OF SYSTEMS:  A comprehensive review of 10 systems was negative except for pertinent positives noted in HPI.    PHYSICAL EXAM:  ++TeleHealth visit - physical exam not performed      Lab Results   Component Value Date    CREATININE 1.14 (H) 12/23/2018     Lab Results   Component Value Date    ALKPHOS 101 12/23/2018    ALKPHOS 102 12/23/2018    BILITOT 0.4 12/23/2018    BILITOT 0.4 12/23/2018    BILIDIR <0.10 12/23/2018    PROT 7.7 12/23/2018    PROT 7.7 12/23/2018    ALBUMIN 4.7 12/23/2018    ALBUMIN 4.7 12/23/2018    ALT 19 12/23/2018    ALT 19 12/23/2018    AST 26 12/23/2018    AST 25 12/23/2018         I personally spent over half of a total 22  minutes in counseling and discussion with the patient as described above.    Pam Drown, FNP-BC, [facility]  Outpatient Oncology Palliative Care Service  [facility]  [address], [place], [place] [address]  [phone]    This patient visit was completed through the use of a telephone encounter.      This patient encounter is appropriate and reasonable under the circumstances given the patient's particular presentation at this time. The patient has been advised of the potential risks and limitations of this mode of treatment (including, but not limited to, the absence of in-person examination) and has agreed to be treated in a remote fashion in spite of them. Any and all of the patient's/patient's family's questions on this issue have been answered.     The patient has also been advised to contact this office for worsening conditions or problems, and seek emergency medical treatment and/or call 911 if the patient deems either necessary.    I was the supervising physician in the delivery of the service. Hilda Blades, MD

## 2019-01-15 ENCOUNTER — Other Ambulatory Visit: Payer: Self-pay | Admitting: Internal Medicine

## 2019-01-16 ENCOUNTER — Other Ambulatory Visit: Payer: Self-pay

## 2019-01-16 MED ORDER — CITALOPRAM HYDROBROMIDE 20 MG PO TABS: 20.0000 mg | ORAL_TABLET | Freq: Every day | ORAL | 1 refills | Status: DC

## 2019-01-19 NOTE — Unmapped (Signed)
Phone Visit  Patient approved appointment change: yes  Patient demographics and insurance updated: yes  MSPQ complete: N/A  Instructions provided for phone visit: yes

## 2019-01-20 ENCOUNTER — Ambulatory Visit: Admit: 2019-01-20 | Discharge: 2019-01-21 | Payer: MEDICARE

## 2019-01-20 DIAGNOSIS — C3490 Malignant neoplasm of unspecified part of unspecified bronchus or lung: Secondary | ICD-10-CM | POA: Diagnosis not present

## 2019-01-20 LAB — CBC W/ AUTO DIFF
BASOPHILS ABSOLUTE COUNT: 0.1 10*9/L (ref 0.0–0.1)
BASOPHILS RELATIVE PERCENT: 0.7 %
EOSINOPHILS RELATIVE PERCENT: 4.2 %
HEMATOCRIT: 39.6 % (ref 36.0–46.0)
HEMOGLOBIN: 13.5 g/dL (ref 13.5–16.0)
LARGE UNSTAINED CELLS: 2 % (ref 0–4)
LYMPHOCYTES ABSOLUTE COUNT: 0.9 10*9/L — ABNORMAL LOW (ref 1.5–5.0)
LYMPHOCYTES RELATIVE PERCENT: 12.6 %
MEAN CORPUSCULAR HEMOGLOBIN CONC: 34.2 g/dL (ref 31.0–37.0)
MEAN CORPUSCULAR HEMOGLOBIN: 29.8 pg (ref 26.0–34.0)
MEAN CORPUSCULAR VOLUME: 87.2 fL (ref 80.0–100.0)
MEAN PLATELET VOLUME: 6.9 fL — ABNORMAL LOW (ref 7.0–10.0)
MONOCYTES ABSOLUTE COUNT: 0.4 10*9/L (ref 0.2–0.8)
MONOCYTES RELATIVE PERCENT: 5.8 %
NEUTROPHILS ABSOLUTE COUNT: 5.5 10*9/L (ref 2.0–7.5)
NEUTROPHILS RELATIVE PERCENT: 74.7 %
PLATELET COUNT: 324 10*9/L (ref 150–440)
RED CELL DISTRIBUTION WIDTH: 14.7 % (ref 12.0–15.0)
RED CELL DISTRIBUTION WIDTH: 14.7 % — ABNORMAL LOW (ref 12.0–15.0)
WBC ADJUSTED: 7.4 10*9/L (ref 4.5–11.0)

## 2019-01-20 LAB — HEPATIC FUNCTION PANEL
ALBUMIN: 4.9 g/dL (ref 3.5–5.0)
ALT (SGPT): 17 U/L (ref <35)
AST (SGOT): 23 U/L (ref 14–38)
AST (SGOT): 23 U/L (ref 14–38)
PROTEIN TOTAL: 8 g/dL (ref 6.5–8.3)

## 2019-01-20 LAB — COMPREHENSIVE METABOLIC PANEL
ALBUMIN: 4.9 g/dL (ref 3.5–5.0)
ALKALINE PHOSPHATASE: 97 U/L (ref 38–126)
ALT (SGPT): 18 U/L (ref <35)
ANION GAP: 16 mmol/L — ABNORMAL HIGH (ref 7–15)
AST (SGOT): 22 U/L (ref 14–38)
BILIRUBIN TOTAL: 0.5 mg/dL (ref 0.0–1.2)
BLOOD UREA NITROGEN: 21 mg/dL (ref 7–21)
CALCIUM: 10.5 mg/dL — ABNORMAL HIGH (ref 8.5–10.2)
CHLORIDE: 106 mmol/L (ref 98–107)
CO2: 22 mmol/L (ref 22.0–30.0)
CREATININE: 1.12 mg/dL — ABNORMAL HIGH (ref 0.60–1.00)
EGFR CKD-EPI AA FEMALE: 58 mL/min/{1.73_m2} — ABNORMAL LOW (ref >=60)
EGFR CKD-EPI NON-AA FEMALE: 50 mL/min/{1.73_m2} — ABNORMAL LOW (ref >=60)
GLUCOSE RANDOM: 87 mg/dL (ref 65–179)
GLUCOSE RANDOM: 87 mg/dL — ABNORMAL LOW (ref 65–179)
POTASSIUM: 4 mmol/L (ref 3.5–5.0)
PROTEIN TOTAL: 8 g/dL (ref 6.5–8.3)
SODIUM: 144 mmol/L (ref 135–145)

## 2019-01-20 NOTE — Unmapped (Signed)
Pt is aware of PHONE visit with medley and meds were reviewed

## 2019-01-21 ENCOUNTER — Institutional Professional Consult (permissible substitution): Admit: 2019-01-21 | Discharge: 2019-01-22 | Payer: MEDICARE | Attending: Adult Health | Primary: Adult Health

## 2019-01-21 DIAGNOSIS — C3490 Malignant neoplasm of unspecified part of unspecified bronchus or lung: Secondary | ICD-10-CM

## 2019-01-21 DIAGNOSIS — K521 Toxic gastroenteritis and colitis: Principal | ICD-10-CM

## 2019-01-21 DIAGNOSIS — R110 Nausea: Secondary | ICD-10-CM | POA: Diagnosis not present

## 2019-01-21 DIAGNOSIS — R21 Rash and other nonspecific skin eruption: Secondary | ICD-10-CM | POA: Diagnosis not present

## 2019-01-21 DIAGNOSIS — Z923 Personal history of irradiation: Secondary | ICD-10-CM | POA: Diagnosis not present

## 2019-01-21 DIAGNOSIS — R197 Diarrhea, unspecified: Secondary | ICD-10-CM | POA: Diagnosis not present

## 2019-01-21 DIAGNOSIS — C3412 Malignant neoplasm of upper lobe, left bronchus or lung: Secondary | ICD-10-CM | POA: Diagnosis not present

## 2019-01-21 DIAGNOSIS — T50995A Adverse effect of other drugs, medicaments and biological substances, initial encounter: Secondary | ICD-10-CM | POA: Diagnosis not present

## 2019-01-21 DIAGNOSIS — Z9221 Personal history of antineoplastic chemotherapy: Secondary | ICD-10-CM | POA: Diagnosis not present

## 2019-01-21 MED ORDER — OCTREOTIDE ACETATE 100 MCG/ML INJECTION SOLUTION
SUBCUTANEOUS | 0 refills | 0 days | Status: CP
Start: 2019-01-21 — End: 2019-01-27

## 2019-01-21 NOTE — Unmapped (Signed)
Referring Physician: Reita Chard, Agnp  [address]  [place], [place] [address].   PCP: Sherlene Shams, MD  Medical Oncologist: Eloise Harman, MD  Radiation Oncologist: Rubye Beach, MD  Thoracic Surgeon: Merrie Roof, MD    Reason for visit: Krista Pratt was initially seen at the request of Dr. Jacqulyn Bath for a thoracic medical oncology opinion regarding new diagnosis of NSCLC.    DIAGNOSIS: NSCLC, adenocarcinoma    STAGE: pT2a N0, now likely stage IV    HPI:  Krista Pratt is a 68 y.o. female former smoker with a 12 pack-year smoking history and past medical history of HTN, hypothyroidism, OA, depression and glaucoma who was recently diagnosed with a pT2a N0.    - 08/2015 - persistent cough lead to a CT, which found a mass  - bronch-mediated Bx of LUL mass showed non-specific inflammation  - Followed with serial CT's  - 03/2016 - CT showed interval increase in size of LUL mass (extending from just SUP to L hilum to apex)  - Pt was asymptomatic at the time  - 03/2016 - PFT's nl  - PET showed FDG-avid lesion in LUL, no hot LN's or distant mets  - 04/04/16 (Dr. Jacqulyn Bath) - lobectomy and mediastinal LND   - Path showed invasive mucinous adenocarcinoma (~5 cm) with a SM+ at the stapled vascular margin. 0/12 LN's.  - completed adjuvant cis/pem and XRT in 1/18  - 7/18 local regional recurrence s/p resection 06/06/17, PD-L1 0%. Path showed invasive mucinous adenocarcinoma 1.6cm with negative margins. STRATA with ERBB2 mutation.   - recommended active surveillance  - 12/23/17 PET concerning for recurrence  - 01/06/18 FNA showed atypical epithelial cells  - 02/06/18 L pneumonectomy 5.6cm invasive mucinous adenocarcinoma ([id number])  -10/27/18 started neratinib for PD in multiple right lung nodules    Interim History:   Continues neratinib with DR to 200mg /day. Still with persistent diarrhea, a good day 2-3 stool and bad day 6 -7 stools per day. Continues taking lomotil and immodium. Her n Rolly Pancake has resolved and she is not taking zyprexa because it leaves her feeling hungover and lethargic. She notes some dry skin, developing patches of raised scaly areas on her hands and face. Using triamcinolone cream but without improvement. She has started a different antidepressant and is feeling much better. No other complaints.     She denies headaches, dizziness, chest pain, SOB, cough, N/V/D, swelling, neuropathy, infectious symptoms.         ROS:  10 systems were reviewed and found to be within normal limits except as described in the HPI.    PMH:  Past Medical History:   Diagnosis Date   ??? Allergic rhinitis    ??? Cancer (CMS-HCC)     Lung   ??? CTS (carpal tunnel syndrome)    ??? Depression    ??? Glaucoma     left eye   ??? Hypertension    ??? Hypothyroidism    ??? Osteoarthritis      PSH:  Past Surgical History:   Procedure Laterality Date   ??? APPENDECTOMY N/A 1963    ruptured w. complications   ??? CARPAL TUNNEL RELEASE Right 03/01/14   ??? EXPLORATORY LAPAROTOMY N/A 1989   ??? NASAL SINUS SURGERY N/A 2013   ??? PR BRNCHSC EBUS GUIDED SAMPL 1/2 NODE STATION/STRUX N/A 01/06/2018    Procedure: Bronch, Rigid Or Flexible, Inc Fluoro Guidance, When Performed; With Ebus Guided Transtracheal And/Or Transbronchial Sampling, One Or Two Mediastinal And/Or Hilar  Lymph Node Stations Or Structures;  Surgeon: Mercy Moore, MD;  Location: MAIN OR [facility];  Service: Pulmonary   ??? PR BRONCHOSCOPY W/THER ASPIR TRACHBRNCL TREE 1ST N/A 06/06/2017    Procedure: Bronchoscpy, Rigid Or Flexible, W/Fluoro; With Therapeutic Aspiration Of Tracheobronchial Tree, Initial;  Surgeon: Cherie Dark, MD;  Location: MAIN OR [facility];  Service: Thoracic   ??? PR BRONCHOSCOPY W/THER ASPIR TRACHBRNCL TREE 1ST N/A 02/06/2018    Procedure: Bronchoscpy, Rigid Or Flexible, W/Fluoro; With Therapeutic Aspiration Of Tracheobronchial Tree, Initial;  Surgeon: Cherie Dark, MD;  Location: MAIN OR [facility];  Service: Thoracic   ??? PR BRONCHOSCOPY,COMPUTER ASSIST/IMAGE-GUIDED NAVIGATION N/A 08/31/2015    Procedure: Bronchoscopy, Flexible, Include Fluoro When Performed; W/Computer-Assist, Image-Guided Navigation;  Surgeon: Jerelyn Charles, MD;  Location: MAIN OR [facility];  Service: Pulmonary   ??? PR BRONCHOSCOPY,DIAGNOSTIC W LAVAGE Left 08/31/2015    Procedure: Bronchoscopy, Flexible, Include Fluoroscopic Guidance When Performed; W/Bronchial Alveolar Lavage;  Surgeon: Jerelyn Charles, MD;  Location: MAIN OR [facility];  Service: Pulmonary   ??? PR BRONCHOSCOPY,TRANSBRONCH BIOPSY Left 08/31/2015    Procedure: Bronchoscopy, Flexible, Include Fluoro Guidance When Performed; W/Transbronchial Lung Bx, Single Lobe;  Surgeon: Jerelyn Charles, MD;  Location: MAIN OR [facility];  Service: Pulmonary   ??? PR DECORTICATION,PULMONARY,TOTAL Left 02/06/2018    Procedure: Decortic Pulm (Separt Proc); Tot;  Surgeon: Cherie Dark, MD;  Location: MAIN OR [facility];  Service: Thoracic   ??? PR EXCIS INTERDIGITAL NEUROMA,EA Left 02/01/2015    Procedure: EXC INTERDIGITAL NEUROMA SNGL EA;  Surgeon: Christena Deem, MD;  Location: MAIN OR [facility];  Service: Orthopedics   ??? PR INJECT NERV BLCK,INTERCOST,MULTPL Left 06/06/2017    Procedure: Intercostal Nerve Block-Multi Levels;  Surgeon: Cherie Dark, MD;  Location: MAIN OR [facility];  Service: Thoracic   ??? PR LARYNGOPLASTY MEDIALIZATION UNLIATERAL Bilateral 04/30/2018    Procedure: LARYNGOPLASTY, MEDIALIZATION, UNILATERAL;  Surgeon: Hardie Pulley, MD;  Location: MAIN OR [facility];  Service: ENT   ??? PR MUSCLE-SKIN FLAP,TRUNK Left 02/06/2018    Procedure: Muscle, Myocutaneous, Or Fasciocutaneous Flap; Trunk;  Surgeon: Cherie Dark, MD;  Location: MAIN OR [facility];  Service: Thoracic   ??? PR REMOVAL OF LUNG Left 02/06/2018    Procedure: REMOVAL OF LUNG, PNEUMONECTOMY;  Surgeon: Cherie Dark, MD;  Location: MA[facility] OR [facility];  Service: Thoracic   ??? PR THORACOSCOPY W/DX WEDGE RESEXN ANATO LUNG RESEXN Left 04/04/2016    Procedure: ROBOTIC XI THORACOSCOPY, SURGICAL; WITH DIAGNOSTIC WEDGE RESECTION FOLLOWED BY ANATOMIC LUNG RESECTION;  Surgeon: Cherie Dark, MD;  Location: MAIN OR [facility];  Service: Cardiothoracic   ??? PR THORACOTOMY W/THERAPEUTIC WEDGE RESEXN INITIAL Left 06/06/2017    Procedure: Thoracotomy; With Therapeutic Wedge Resection (Eg, Mass, Nodule), Initial;  Surgeon: Cherie Dark, MD;  Location: MAIN OR [facility];  Service: Thoracic   ??? PR THORACOTOMY,LYSE ADHESIONS Left 06/06/2017    Procedure: THORACOTOMY; WITH OPEN INTRAPLEURAL PNEUMONOLYSIS;  Surgeon: Cherie Dark, MD;  Location: MAIN OR [facility];  Service: Thoracic   ??? PR WRIST ARTHROSCOP,RELEASE XVERS LIG Right 02/26/2014    Procedure: ENDOSCOPY WRIST SURG; [employer] TRAN[employer]ER LIGAMT;  Surgeon: Lorelee Cover, MD;  Location: ASC OR [facility];  Service: Orthopedics   ??? TONSILLECTOMY Bilateral 1950's   ??? TUBAL LIGATION Bilateral 1991       MEDICATIONS:  has a current medication list which includes the following prescription(s): acetaminophen, azelastine, cholecalciferol (vitamin d3), citalopram, clonazepam, diphenoxylate-atropine, fluticasone propionate, lactobacillus 3/fos/pantethine, levothyroxine, loperamide, loratadine, losartan, metoprolol succinate, multivitamin, neratinib, olanzapine, ondansetron,  and octreotide.    ALLERGIES:   Allergies   Allergen Reactions   ??? Amlodipine      edema   ??? Codeine Nausea And Vomiting     Nausea and vomiting       SOCIAL HISTORY:  Social History     Social History Narrative    Quit smoking in 1980's. Previously smoked 1 ppd x 12 years.       reports that she quit smoking about 37 years ago. Her smoking use included cigarettes. She has a 12.00 pack-year smoking history. She has never used smokeless tobacco.    FAMILY HISTORY:  Cancer-related family history includes Prostate cancer in her father. There is no history of Cancer.    PHYSICAL EXAMINATION:  Phone visit    PATHOLOGY:  Addendum   - PD-L1 (16X0) IHC has been requested, results to be reported in an addendum.  ??   Addendum electronically signed by Lorayne Bender, MD on 05/14/2016 at 1004   Final Diagnosis   A:  Lung, left upper lobe, wedge resection  - Invasive mucinous adenocarcinoma, see synoptic report for additional information.  ??  B:  Lung, left upper lobe, completion lobectomy   - Invasive mucinous adenocarcinoma, see synoptic report for additional information.  - No tumor seen in 7 lymph nodes (0/7).  ??  C:  Lymph node, level 7, resection  - No tumor seen in multiple lymph node fragments (0/1).  ??  D:  Lymph node, level 5L, resection  - No tumor seen in multiple lymph node fragments (0/1).  ??  E:  Lymph node, level 6L, resection  - No tumor seen in multiple lymph node fragments (0/1).  ??  F:  Lymph node, level 10L, resection  - No tumor seen in multiple lymph node fragments (0/1).  ??  G:  Lymph node, level 11, resection  - No tumor seen in multiple lymph node fragments (0/1).  ??   Electronically signed by Lorayne Bender, MD on 04/10/2016 at 1706   Synoptic Report   LUNG: Resection   Lung - All Specimens   SPECIMEN   Specimen  Lobe(s) of lung   Lobes of Lung  Upper lobe   Procedure  Other (specify): Wedge resection and completion lobectomy   Specimen Laterality  Left   TUMOR   Primary Tumor Site  Upper lobe   Histologic Type  Invasive mucinous adenocarcinoma   Histologic Grade  Not applicable   Tumor Size  Cannot be determined: Transected tumor in specimens A and B (See Comment)   Tumor Focality  Unifocal   Visceral Pleura Invasion  Not identified   Tumor Extension  Not identified   Lymph-Vascular Invasion  Not identified   MARGINS   Bronchial Margin  Uninvolved by invasive carcinoma and carcinoma in situ   Vascular Margin  Involved by invasive carcinoma   Parenchymal Margin  Not applicable: (See Comment)   LYMPH NODES   Regional Lymph Nodes     Number of Lymph Nodes Examined  At least: 12   Lymph Node Involvement  No nodes involved   STAGE (pTNM)   Primary Tumor (pT)  pT2a: Tumor greater than 3 cm, but 5 cm or less in greatest dimension surrounded by lung or visceral pleura without bronchoscopic evidence of invasion more proximal than the lobar bronchus (i.e., not in the main bronchus); or Tumor 5 cm or less in greatest dimension with any of the following features of extent: involves main bronchus, 2  cm or more distal to the carina; invades the visceral pleura; associated with atelectasis or obstructive pneumonitis that extends to the hilar region but does nt involve the entire lung   Regional Lymph Nodes (pN)  pN0: No regional lymph node metastasis   .      Comment        The tumor is present within specimens A and B, and appears to have been transected in the initial wedge resection, and contiguous with tumor identified in the completion lobectomy.  Because the tumor is present within 2 specimens, the maximum tumor size cannot be determined with certainty; however, the tumor is estimated to be at least pT2a, and likely approaches 5 cm in maximum dimension.  Tumor abuts the staple lines of specimens A and B; however, these staple lines are likely in continuity representing the initial wedge resection, and these likely do not represent a true parenchymal margin.  Frozen section FSB2, the en face staple line at stitch, may also represent tumor at the wedge resection line, and FSB2 does show extensive involvement by tumor in the en face section.  However, this orientation is not clear, and correlation with intraoperative findings is recommended.  ??  In the en face vascular margin of the completion lobectomy (slide B3), there is invasive carcinoma focally present in the perivascular soft tissue adjacent to the vessel, and involvement of the adjacent true stapled margin cannot be excluded. This tissue would be immediately subjacent to the stapled vascular margin, with tumor extending to within at least 2 mm of the true margin, cannot exclude focal involvement by invasive carcinoma.  ??  The diagnosis was called to Dr. Shela Commons. Long on 04/10/16 at 4:00pm by Dr. Katrinka Blazing, and the case was discussed.  ??  The case was also reviewed by Dr. Bo Mcclintock and he concurs with the diagnosis.  ??  The frozen section diagnoses are confirmed.  ??     06/06/17 Wedge resection  Final Diagnosis   A: Lung, left lower lobe, wedge resection   - Ill defined nodule of invasive mucinous adenocarcinoma with lepidic spread and spread through air spaces, spanning 1.6 cm in this wedge biopsy, tumor abutting but not definitively involving pleura, clinically recurrent/metastatic lung adenocarcinoma  - Parenchymal margin free of tumor, tumor is 12 mm from the parenchymal margin   - Two intrapulmonary lymph nodes with no tumor seen (0/2)   ??  B: Lung, left lower lobe, wedge #2  - Three portions of lung parenchyma and pleura with no tumor seen, mild fibrous pleural adhesions present on pleural surface of these specimens     5/19 Pneumonectomy  Final Diagnosis   A: Lung, left lower lobe, lobectomy  - Invasive mucinous adenocarcinoma, 5.5 cm in greatest dimension, clinically recurrent  - Tumor spread through air spaces is identified  - Surgical margins free of tumor  ??  B: Lymph node, 9L, biopsy  - No tumor seen in one lymph node (0/1)  ??     LABS    Results for orders placed or performed in visit on 01/20/19   Hepatic Function Panel   Result Value Ref Range    Albumin 4.9 3.5 - 5.0 g/dL    Total Protein 8.0 6.5 - 8.3 g/dL    Total Bilirubin 0.4 0.0 - 1.2 mg/dL    Bilirubin, Direct <2.13 0.00 - 0.40 mg/dL    AST 23 14 - 38 U/L    ALT 17 <35 U/L    Alkaline Phosphatase  95 38 - 126 U/L   Comprehensive Metabolic Panel   Result Value Ref Range    Sodium 144 135 - 145 mmol/L    Potassium 4.0 3.5 - 5.0 mmol/L    Chloride 106 98 - 107 mmol/L    Anion Gap 16 (H) 7 - 15 mmol/L    CO2 22.0 22.0 - 30.0 mmol/L    BUN 21 7 - 21 mg/dL    Creatinine 1.61 (H) 0.60 - 1.00 mg/dL    BUN/Creatinine Ratio 19     EGFR CKD-EPI Non-African American, Female 50 (L) >=60 mL/min/1.65m2    EGFR CKD-EPI African American, Female 58 (L) >=60 mL/min/1.45m2 Glucose 87 65 - 179 mg/dL    Calcium 09.6 (H) 8.5 - 10.2 mg/dL    Albumin 4.9 3.5 - 5.0 g/dL    Total Protein 8.0 6.5 - 8.3 g/dL    Total Bilirubin 0.5 0.0 - 1.2 mg/dL    AST 22 14 - 38 U/L    ALT 18 <35 U/L    Alkaline Phosphatase 97 38 - 126 U/L   CBC w/ Differential   Result Value Ref Range    WBC 7.4 4.5 - 11.0 10*9/L    RBC 4.54 4.00 - 5.20 10*12/L    HGB 13.5 13.5 - 16.0 g/dL    HCT 04.5 40.9 - 81.1 %    MCV 87.2 80.0 - 100.0 fL    MCH 29.8 26.0 - 34.0 pg    MCHC 34.2 31.0 - 37.0 g/dL    RDW 91.4 78.2 - 95.6 %    MPV 6.9 (L) 7.0 - 10.0 fL    Platelet 324 150 - 440 10*9/L    Neutrophils % 74.7 %    Lymphocytes % 12.6 %    Monocytes % 5.8 %    Eosinophils % 4.2 %    Basophils % 0.7 %    Absolute Neutrophils 5.5 2.0 - 7.5 10*9/L    Absolute Lymphocytes 0.9 (L) 1.5 - 5.0 10*9/L    Absolute Monocytes 0.4 0.2 - 0.8 10*9/L    Absolute Eosinophils 0.3 0.0 - 0.4 10*9/L    Absolute Basophils 0.1 0.0 - 0.1 10*9/L    Large Unstained Cells 2 0 - 4 %         RADIOLOGY  Multiple right lung nodular opacities have decreased in size compared to 09/15/2018; reference right apical 0.8 x 0.7 cm nodule (image 27, series 3) was previously 1.2 x 1.1 cm. Medial right basilar 1.3 x 0.7 cm nodule (image 49, series 3) was previously 1.8 x 1 cm. New right basilar 0.5 cm ill-defined nodule (image 57, series 2).    STRATA  ERBB2 p.V842I  CTNNB1 p.S37F     ASSESSMENT AND PLAN: [name] is a 68yoF with history of Stage IB (pT2a pN0 pMx) invasive mucinous adenocarcinoma s/p multiple surgeries, now on neratinib.     Stage IV NSCLC: ERBB2 mutation. Currently on neratinib 200 mg daily (dose-reduced from 240 mg ). RTC 6 weeks for labs, scans, provider visit.     Diarrhea: Lomotil prescribed but still with multiple loose stools. Already max dose of lomotil and PRN immodium.   -- octreotide sq TID for three days, then sq TID three days. Follow up with Tammy Allred after 6 day dosing period for update. Favor this over tincture of opium given previous sedation with zyprexa and codeine allergy.    Nausea: Related to neratinib - resolved. No longer taking zyprexa because of sedating effects.  Skin rash: mild, resembling eczema per patient.   -- using previously acquired triamcinolone cream with little improvement - discontinue   -- instructed to use OTC emollient such as eucerin instead.    I spent 25 minutes on the phone with the patient. I spent an additional 25 minutes on pre- and post-visit activities.     The patient was physically located in [place] or a state in which I am permitted to provide care. The patient understood that s/he may incur co-pays and cost sharing, and agreed to the telemedicine visit. The visit was completed via phone and/or video, which was appropriate and reasonable under the circumstances given the patient's presentation at the time.    The patient has been advised of the potential risks and limitations of this mode of treatment (including, but not limited to, the absence of in-person examination) and has agreed to be treated using telemedicine. The patient's/patient's family's questions regarding telemedicine have been answered.     If the phone/video visit was completed in an ambulatory setting, the patient has also been advised to contact their provider???s office for worsening conditions, and seek emergency medical treatment and/or call 911 if the patient deems either necessary.

## 2019-01-22 ENCOUNTER — Telehealth: Admit: 2019-01-22 | Discharge: 2019-01-23 | Payer: MEDICARE

## 2019-01-22 MED ORDER — EMPTY CONTAINER
3 refills | 0 days
Start: 2019-01-22 — End: ?

## 2019-01-22 NOTE — Unmapped (Signed)
[facility]  Comprehensive Cancer Support Program  Video Visit  New / Established Patient       THIS IS A NON-BILLABLE ENCOUNTER      Encounter Description: This encounter was conducted via video visit in the setting of State of Emergency due to COVID-19 Pandemic. Krista Pratt was located in her home in [place], [place]. Patient interviewed in a private place in her home.     Assessment:  Krista Pratt is a 68 yo woman with stage IV NSCLC initially diagnosed in 03/2016. Pt is s/p lobectomy LUL and mediastinal LND in 03/2016 and adjuvant chemotherapy and XRT completed in 10/2016. Local regional recurrence found in 03/2017, s/p resection in 06/2017. Imaging in 11/2017 concerning for recurrence and FNA in 03/2018 revealed atypical epithelial cells, s/p pneumonectomy in 01/2018. In 08/2018 routine surveillance imaging detected new right lung pulmonary nodules concerning for metastatic disease versus possible pneumonitis / pneumonia process.  Following course of antibiotics, 09/16/2018 repeat PET CT showed increase in size in multiple right lung nodules concerning for metastatic disease, currently treated with neratinib. Krista Pratt is an established patient with [facility] and is participating in telehealth follow-up supportive counseling by video conferencing.        Risk Assessment:  There were no concerns for pt safety identified during the interview.  While future psychiatric events cannot be accurately predicted, the patient does not currently require acute inpatient psychiatric care and does not currently meet [facility] involuntary commitment criteria.     Plan:  Pt has my contact information. Continuing supportive counseling was offered and a follow-up appointment is scheduled for 5/21. Pt was encouraged to contact me if she wishes to change appointment for earlier visit.        Subjective:      Ms. Krista Pratt reported long h/o of depression for which she has taken Wellbutrin managed by her PCP. She began to feel that the Wellbutrin might be contributing to an increase in anxiety and she is following her PCP???s instructions in transitioning from Wellbutrin to Celexa and feels that Celexa is helping her mood. She is also prescribed clonazepam for anxiety. She was taking Zyprexa for nausea and found it helpful for her mood and sleep but she has recently discontinued because she feels hung over the next day.      Provided active listening and normalized pt's thoughts and feelings in her present circumstance. Provided supportive counseling. Ms. Krista Pratt desires ongoing supportive counseling but feels monthly visits would be adequate at this time. A follow-up appointment is scheduled for 02/19/19. She has my contact information and will contact me if she wishes to move appointment up.       Objective:    Mental Status Exam:  Appearance:    Appears stated age   Motor:   No abnormal movements   Speech/Language:    Normal rate, volume, tone, fluency   Mood:   Okay   Affect:   Calm, Cooperative and Euthymic   Thought process and Associations:   Logical, linear, clear, coherent, goal directed   Abnormal/psychotic thought content:     Denies SI, HI, self harm   Perceptual disturbances:     Does not endorse auditory or visual hallucinations     Orientation:   Oriented to person, place, time, and general circumstances   Attention and Concentration:   Able to fully concentrate and attend   Memory:   Immediate, short-term, long-term, and recall grossly intact    Fund of knowledge:  Consistent with level of education and development   Insight:     Intact   Judgment:    Intact   Impulse Control:   Intact     Time spent: 40 minutes    Perry Mount, MS  Counselor  Comprehensive Cancer Support Program  [phone] / [phone]      THIS IS A NON-BILLABLE ENCOUNTER

## 2019-01-22 NOTE — Unmapped (Signed)
[facility]   Patient Onboarding/Medication Counseling    Krista Pratt is a 68 y.o. female with diarrhea due to drug who I am counseling today on initiation of therapy.  I am speaking to the patient.    Verified patient's date of birth / HIPAA.    Specialty medication(s) to be sent: Hematology/Oncology: Sandostatin      Non-specialty medications/supplies to be sent: syringes, sharps container      Medications not needed at this time: n/a       Octreotide (Sandostatin) 100 mcg/ml    Medication & Administration     Dosage: Inject 1 mL (100 mcg total) under the skin Three (3) times a day for 3 days, THEN 0.5 mL (50 mcg total) Three (3) times a day for 3 days. Vials are SINGLE use. Discard any remainder.    Administration:    All short-acting injection products:  ???It is given as a shot into the fatty part of the skin.  ???If you will be giving yourself the shot, your doctor or nurse will teach you how to give the shot.  ???Wash your hands before and after use.  ???Before giving the shot, let it come to room temperature. Do not heat this drug.  ???Move the site where you give the shot with each shot.  ???Do not give into skin within 2 inches of the last injection.  ???Do not give into skin within 2 inches of the belly button.  ???Do not use if the solution is cloudy, leaking, or has particles.  ???Do not use if solution changes color.  ???Throw away needles in a needle/sharp disposal box. Do not reuse needles or other items.      Adherence/Missed dose instructions: Take a missed dose as soon as you think about it.  If it is close to the time for your next dose, skip the missed dose and go back to your normal time.  Do not take 2 doses at the same time or extra doses.    Goals of Therapy     To decrease and prevent diarrhea associated with drug-related side effects.    Side Effects & Monitoring Parameters   Commonly reported side effects:  ? Gas.  ? Flu-like signs.  ? Headache.  ? Constipation, diarrhea, stomach pain, upset stomach, or throwing up.  ? Feeling dizzy, tired, or weak.  ? Pain where the shot was given.  ? Hair loss.  ? Back, muscle, or joint pain.  ? Nose or throat irritation.      The following side effects should be reported to the provider:  ? Signs of an allergic reaction, like rash; hives; itching; red, swollen, blistered, or peeling skin with or without fever; wheezing; tightness in the chest or throat; trouble breathing, swallowing, or talking; unusual hoarseness; or swelling of the mouth, face, lips, tongue, or throat.  ? Signs of low thyroid levels like constipation; not able to handle cold; memory problems; mood changes; or a burning, numbness, or tingling feeling that is not normal.  ? Signs of gallbladder problems like pain in the upper right belly area, right shoulder area, or between the shoulder blades; yellow skin or eyes; fever with chills; bloating; or very upset stomach or throwing up.  ? Signs of high blood pressure like very bad headache or dizziness, passing out, or change in eyesight.  ? Signs of a pancreas problem (pancreatitis) like very bad stomach pain, very bad back pain, or very bad upset stomach or throwing  up.  ? Slow heartbeat or a heartbeat that does not feel normal.  ? Bloating or swelling of belly.  ? Feeling very tired or weak.  ? Signs of high or low blood sugar like breath that smells like fruit, dizziness, fast breathing, fast heartbeat, feeling confused, feeling sleepy, feeling weak, flushing, headache, more thirsty or hungry, passing urine more often, shaking, or sweating.    Monitoring Parameters: Obtain thyroid function tests, vitamin B12 level, blood glucose, cardiac function (heart rate, ECG), and zinc level (TPN patients) in patients on chronic therapy. Obtain growth hormone, and somatomedin C in patients with acromegaly. Obtain vasoactive intestinal peptide in patients with VIPomas. Obtain 5-HIAA, plasma serotonin, and plasma substance P in patients with carcinoid tumors. Dosage adjustment may be needed in patients on dialysis or with cirrhosis, and geriatric patients. Assess other medicines patient may be taking; alternate therapy or dosage adjustments may be needed. Assess for signs of pancreatitis and cholelithiasis.      Contraindications, Warnings, & Precautions   Contraindications:   ? Hypersensitivity to octreotide or any component of the formulation    Warnings/Precautions    ??? Abnormal Schillings test: Chronic treatment has been associated with abnormal Schillings test; monitor vitamin B12 levels.  ??? Cholelithiasis: May impair gallbladder function (inhibits gallbladder contractility and decreases bile secretion); monitor patients for cholelithiasis.   ??? Glucose regulation: Somatostatin analogs may affect glucose regulation. In type I diabetes, severe hypoglycemia may occur; in type II diabetes or patients without diabetes, hyperglycemia             may occur. Insulin and other hypoglycemic medication requirements may change. Octreotide may worsen hypoglycemia in patients with insulinomas; use with caution.  ??? Local reactions: Mild to moderate injection-site pain (usually lasting 1 hour) may occur with the depot formulation.  ??? Hypothyroidism: Suppresses secretion of TSH; monitor for hypothyroidism.  ??? Pancreatitis: May alter absorption of dietary fats; monitor for pancreatitis.  ??? Cardiovascular disease: Use with caution in patients with heart failure or concomitant medications that alter heart rate or rhythm; bradycardia, conduction abnormalities, and arrhythmia      have been observed in acromegalic and carcinoid syndrome patients. Cardiovascular medication requirements may change.  ??? Excessive fluid loss: May reduce excessive fluid loss in patients with conditions that cause such a loss  ??? Hepatic impairment: Use caution in patients with hepatic impairment; dosage adjustment may be required in patients with established cirrhosis.  ??? Renal impairment: Use with caution in patients with renal impairment; dosage adjustment may be required in patients receiving dialysis.  ??? Elderly: Dosage adjustment may be necessary; significant increases in elimination half-life have been observed in older adults.  ??? Females: Therapy may restore fertility; females of childbearing potential should use adequate contraception.    Drug/Food Interactions     ? Medication list reviewed in Epic. The patient was instructed to inform the care team before taking any new medications or supplements. Citalopram may enhance the hypoglycemia effects of octreotide.  Monitoring required.  Metoprolol may enhance the bradycardic effects of octreotide.  Monitoring required..   ? QTc-prolonging agents: Octreotide may enhance the adverse/toxic effects of other QTc-prolonging agents.    Storage, Handling Precautions, & Disposal   ? Store this medication in the refrigerator . Do not freeze  ? Store in the original container to protect from light.  ? Keep all drugs in a safe place. Keep all drugs out of the reach of children and pets.  ? Throw away unused or expired drugs.  Do not flush down a toilet or pour down a drain unless you are told to do so. Check with your pharmacist if you have questions about the best way to throw out drugs. There may be drug take-back programs in your area.      Current Medications (including OTC/herbals), Comorbidities and Allergies     Current Outpatient Medications   Medication Sig Dispense Refill   ??? acetaminophen (TYLENOL) 325 MG tablet Take 2 tablets (650 mg total) by mouth every four (4) hours as needed.  0   ??? azelastine (ASTELIN) 137 mcg nasal spray 1 spray by Each Nare route nightly. Use in each nostril as directed     ??? cholecalciferol, vitamin D3, (VITAMIN D3 ORAL) Take 1,000 Units by mouth daily.      ??? citalopram (CELEXA) 20 MG tablet Take 20 mg by mouth daily.     ??? clonazePAM (KLONOPIN) 0.5 MG tablet Take 0.5 mg by mouth two (2) times a day as needed.      ??? diphenoxylate-atropine (LOMOTIL) 2.5-0.025 mg per tablet Take 1 tablet by mouth 4 (four) times a day as needed for diarrhea. 120 tablet 1   ??? fluticasone (FLONASE) 50 mcg/actuation nasal spray 1 spray by Each Nare route daily as needed.      ??? LACTOBAC CMB #3/FOS/PANTETHINE (PROBIOTIC & ACIDOPHILUS ORAL) Take 1 packet by mouth daily at 0600.      ??? levothyroxine (SYNTHROID, LEVOTHROID) 50 MCG tablet Take 50 mcg by mouth daily at 0600.     ??? loperamide (IMODIUM A-D) 2 mg tablet 4 mg TID for 2 weeks, then 4 mg BID for 6 weeks, then 4 mg PRN after. 120 tablet 2   ??? loratadine (CLARITIN) 10 mg tablet Take 10 mg by mouth daily as needed.      ??? losartan (COZAAR) 100 MG tablet Take 100 mg by mouth once daily.  3   ??? metoprolol succinate (TOPROL-XL) 50 MG 24 hr tablet Take 75 mg by mouth daily.      ??? multivitamin (MULTIVITAMIN) per tablet Take 1 tablet by mouth daily.      ??? neratinib (NERLYNX) 40 mg tablet Take 5 tablets (200 mg total) by mouth daily. Take with food. 150 tablet 11   ??? octreotide (SANDOSTATIN) 100 mcg/mL Soln Inject 1 mL (100 mcg total) under the skin Three (3) times a day for 3 days, THEN 0.5 mL (50 mcg total) Three (3) times a day for 3 days. Vials are SINGLE use. Discard any remainder. 18 mL 0   ??? OLANZapine (ZYPREXA) 5 MG tablet Take 1 tablet (5 mg total) by mouth nightly. 90 tablet 3   ??? ondansetron (ZOFRAN) 8 MG tablet Take 1 tablet (8 mg total) by mouth every eight (8) hours as needed for nausea. 30 tablet 2     No current facility-administered medications for this visit.        Allergies   Allergen Reactions   ??? Amlodipine      edema   ??? Codeine Nausea And Vomiting     Nausea and vomiting       Patient Active Problem List   Diagnosis   ??? Allergic state   ??? Dysthymic disorder   ??? Carpal tunnel syndrome   ??? Osteoarthritis of both knees   ??? Endometriosis   ??? GERD (gastroesophageal reflux disease)   ??? Hypertension (RAF-HCC)   ??? Hypothyroidism   ??? Pure hypercholesterolemia   ??? Abnormal chest CT   ??? Mass of  upper lobe of left lung   ??? Anxiety and depression   ??? Adenocarcinoma, lung (CMS-HCC)   ??? Allergic rhinitis   ??? Dyspnea   ??? Medicare annual wellness visit, initial   ??? Obesity (BMI 30-39.9)   ??? Right shoulder pain   ??? Screening for breast cancer   ??? On antineoplastic chemotherapy   ??? 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 Oral thrush   ??? Dysphonia   ??? Acute bronchitis   ??? Renal failure   ??? Allergy   ??? Tachycardia   ??? Encounter for Medicare annual wellness exam   ??? Sinusitis, acute, maxillary   ??? Status post L pneumonectomy   ??? Cough in adult   ??? UTI (urinary tract infection)   ??? Adenocarcinoma of lung, stage 1, left (CMS-HCC)   ??? Left carotid bruit   ??? Prediabetes       Reviewed and up to date in Epic.    Appropriateness of Therapy     Is medication and dose appropriate based on diagnosis? Yes    Baseline Quality of Life Assessment      How many days over the past month did your diarrhea keep you from your normal activities? 0    Financial Information     Medication Assistance provided: Prior Authorization    Anticipated copay of $105.06 reviewed with patient. Verified delivery address.    Delivery Information     Scheduled delivery date: 01/23/19    Expected start date: 01/23/19    Medication will be delivered via Same Day Courier to the home address in [place].  This shipment will not require a signature.      Explained the services we provide at [facility] and that each month we would call to set up refills.  Stressed importance of returning phone calls so that we could ensure they receive their medications in time each month.  Informed patient that we should be setting up refills 7-10 days prior to when they will run out of medication.  A pharmacist will reach out to perform a clinical assessment periodically.  Informed patient that a welcome packet and a drug information handout will be sent.      Patient verbalized understanding of the above information as well as how to contact the pharmacy at [phone] option 4 with any questions/concerns.  The pharmacy is open Monday through Friday 8:30am-4:30pm.  A pharmacist is available 24/7 via pager to answer any clinical questions they may have.    Patient Specific Needs     ? Does the patient have any physical, cognitive, or cultural barriers? No    ? Patient prefers to have medications discussed with  Patient     ? Is the patient able to read and understand education materials at a high school level or above? No    ? Patient's primary language is  English     ? Is the patient high risk? No     ? Does the patient require a Care Management Plan? No     ? Does the patient require physician intervention or other additional services (i.e. nutrition, smoking cessation, social work)? No      Scout Guyett  Anders Grant  [facility] Specialty Pharmacist

## 2019-01-22 NOTE — Unmapped (Signed)
I have ordered octreotide to be administered subcutaneously (under the skin) for 6 days. The first three days with be three times daily and the next three days will be three times per day. Please touch base with Tammy after 6 days so we can follow up on your diarrheal symptoms.     Please call with concerns or questions.     For appointments & questions Monday through Friday 8 AM???5 PM      Please call [phone] or Toll free [phone]     For urgent clinical needs on Nights, Weekends or Holidays  Call [phone] and ask for the oncologist on call.      For appointment changes please contact during normal business hours.      Please visit PrivacyFever.cz, a resource created just for family members and caregivers.  This website lists support services, how and where to ask for help. It has tools to assist you as you help [place] care for your loved one.     [facility]  [address]  [place], [place] [address]  [email or web address]

## 2019-01-22 NOTE — Unmapped (Signed)
After counseling and setting up delivery, patient called back and said she reviewed the medication on the Internet and would like to hold off on delivery until she is able to speak with her provider again.  She has some hesitations after reading information online.

## 2019-01-27 NOTE — Unmapped (Signed)
Patient has decided not to start therapy.  Dis-enrolling.

## 2019-02-02 ENCOUNTER — Telehealth: Admit: 2019-02-02 | Discharge: 2019-02-03 | Payer: MEDICARE

## 2019-02-02 DIAGNOSIS — K521 Toxic gastroenteritis and colitis: Principal | ICD-10-CM

## 2019-02-02 DIAGNOSIS — C3490 Malignant neoplasm of unspecified part of unspecified bronchus or lung: Secondary | ICD-10-CM | POA: Diagnosis not present

## 2019-02-19 ENCOUNTER — Telehealth: Admit: 2019-02-19 | Discharge: 2019-02-20 | Payer: MEDICARE

## 2019-02-20 ENCOUNTER — Encounter: Payer: Self-pay | Admitting: Internal Medicine

## 2019-02-23 ENCOUNTER — Other Ambulatory Visit: Payer: Self-pay

## 2019-02-23 ENCOUNTER — Ambulatory Visit (INDEPENDENT_AMBULATORY_CARE_PROVIDER_SITE_OTHER): Payer: Medicare Other | Admitting: Internal Medicine

## 2019-02-23 DIAGNOSIS — F329 Major depressive disorder, single episode, unspecified: Secondary | ICD-10-CM | POA: Diagnosis not present

## 2019-02-23 DIAGNOSIS — Z7189 Other specified counseling: Secondary | ICD-10-CM | POA: Diagnosis not present

## 2019-02-23 DIAGNOSIS — F419 Anxiety disorder, unspecified: Secondary | ICD-10-CM

## 2019-02-23 MED ORDER — BUPROPION HCL ER (XL) 150 MG PO TB24: 150.0000 mg | ORAL_TABLET | Freq: Every day | ORAL | 1 refills | Status: DC

## 2019-02-23 NOTE — Patient Instructions (Signed)
Start back on wellbutrin 150 mg daily  Continue celexa 20 mg daily for week one.  Reduce to every other day ,, or 1/2 celexa daily for week 2, then stop  Your previous dose of wellbutrin was 300 mg ,  So we may need to increase the dose to 300 mg at some point  My chart me in 3 weeks

## 2019-02-24 DIAGNOSIS — Z7189 Other specified counseling: Secondary | ICD-10-CM | POA: Insufficient documentation

## 2019-02-24 NOTE — Progress Notes (Signed)
Virtual Visit via [email or web address] Note  This visit type was conducted due to national recommendations for restrictions regarding the COVID-19 pandemic (e.g. social distancing).  This format is felt to be most appropriate for this patient at this time.  All issues noted in this document were discussed and addressed.  No physical exam was performed (except for noted visual exam findings with Video Visits).   I connected with@ on 02/24/19 at  9:00 AM EDT by a video enabled telemedicine application and verified that I am speaking with the correct person using two identifiers. Location patient: home Location provider: home office Persons participating in the virtual visit: patient, provider  I discussed the limitations, risks, security and privacy concerns of performing an evaluation and management service by telephone and the availability of in person appointments. I also discussed with the patient that there may be a patient responsible charge related to this service. The patient expressed understanding and agreed to proceed.  Reason for visit: depression/anxiety  HPI:  68 yr old woman with metastatic lung Ca under treatment at [facility] presents with increased anhedonia, lack of motivation , fatigue.  She has a history of depression controlled for years with wellbutrin  But medication was changed from welbutrin to celexa in January 2020 due to increased anxiety , hypervigilance, and irritability on the higher dose of wellbutrin (increased to 300 mg in November).   Patient now feels she needs the extra "boost" that the wellbutrin gave her as opposed to the calming effect of the celexa.   Metastatic lung CA: her cancer is responding to the current chemotherapy and she is tolerating it pretty well.  She is scheduled for a CT in June.    The patient has no signs or symptoms of COVID 19 infection (fever, cough, sore throat  or shortness of breath beyond what is typical for patient).  Patient denies contact with  other persons with the above mentioned symptoms or with anyone confirmed to have COVID 19 .  She has been minimizing her contact with the public and using a mask and hand sanitizer when she comes into any contact with the public.    ROS: See pertinent positives and negatives per HPI.  Past Medical History:  Diagnosis Date  . Allergy    seasonal  . Carpal tunnel syndrome   . Degenerative joint disease   . Depression   . Endometriosis 1989   small amount  . Glaucoma    left eye, Dr. Morrison Old in [place]  . HSV infection   . Hypertension   . Osteoporosis    osteopenia, Fosamax then Boniva 2009  . Vitamin D deficiency    on supplements    Past Surgical History:  Procedure Laterality Date  . APPENDECTOMY  1964   open  . CARPAL TUNNEL RELEASE Right 01/2014   Dr. Malvin Johns  . FOOT NEUROMA SURGERY  May 2016   Left foot  . LAPAROSCOPY  1999  . NASAL SINUS SURGERY  2012   Dr. Pryor Ochoa  . THROAT SURGERY  05/02/2018  . TONSILLECTOMY    . TUBAL LIGATION  1992    Family History  Problem Relation Age of Onset  . Osteoporosis Mother        secondary to steroids  . Rheum arthritis Mother   . Hypertension Mother   . COPD Mother   . Arthritis Mother   . Stroke Mother   . Cancer Father        prostate  . Depression Son   .  Anxiety disorder Son   . ADD / ADHD Son   . ADD / ADHD Son   . Hypertrophic cardiomyopathy Sister   . Schizophrenia Paternal Aunt   . Heart disease Maternal Grandmother   . COPD Maternal Grandfather   . Depression Maternal Uncle     SOCIAL HX:  reports that she quit smoking about 35 years ago. Her smoking use included cigarettes. She has a 16.00 pack-year smoking history. She has never used smokeless tobacco. She reports current alcohol use. She reports that she does not use drugs.   Current Outpatient Medications:  .  azelastine (ASTELIN) 0.1 % nasal spray, PLACE 1 SPRAY INTO BOTH NOSTRILS AT BEDTIME AS NEEDED, Disp: 30 mL, Rfl: 6 .  citalopram (CELEXA) 20 MG  tablet, Take 20 mg by mouth daily., Disp: , Rfl:  .  clonazePAM (KLONOPIN) 0.5 MG tablet, Take 1 tablet (0.5 mg total) by mouth 2 (two) times daily as needed for anxiety., Disp: 60 tablet, Rfl: 5 .  diazepam (VALIUM) 5 MG tablet, Take 1 tablet (5 mg total) by mouth every 12 (twelve) hours as needed for anxiety., Disp: 30 tablet, Rfl: 0 .  diphenoxylate-atropine (LOMOTIL) 2.5-0.025 MG tablet, TK 1 T PO QID PRF DH, Disp: , Rfl:  .  levothyroxine (SYNTHROID, LEVOTHROID) 50 MCG tablet, TAKE 1 TABLET(50 MCG) BY MOUTH DAILY, Disp: 90 tablet, Rfl: 1 .  loperamide (IMODIUM) 2 MG capsule, , Disp: , Rfl:  .  loratadine (CLARITIN) 10 MG tablet, Take 10 mg by mouth daily.  , Disp: , Rfl:  .  losartan (COZAAR) 100 MG tablet, TAKE 1 TABLET(100 MG) BY MOUTH DAILY, Disp: 90 tablet, Rfl: 1 .  metoprolol succinate (TOPROL-XL) 25 MG 24 hr tablet, Take 3 tablets (75 mg total) by mouth daily. Take with or immediately following a meal., Disp: 270 tablet, Rfl: 3 .  Multiple Vitamin (MULTIVITAMIN) tablet, Take 1 tablet by mouth daily., Disp: , Rfl:  .  triamcinolone (NASACORT) 55 MCG/ACT AERO nasal inhaler, Place into the nose., Disp: , Rfl:  .  buPROPion (WELLBUTRIN XL) 150 MG 24 hr tablet, Take 1 tablet (150 mg total) by mouth daily., Disp: 90 tablet, Rfl: 1 .  buPROPion (WELLBUTRIN XL) 300 MG 24 hr tablet, Take 1 tablet (300 mg total) by mouth daily. (Patient not taking: Reported on 02/20/2019), Disp: 90 tablet, Rfl: 1 .  NERLYNX 40 MG tablet, , Disp: , Rfl:   EXAM:  VITALS per patient if applicable:  GENERAL: alert, oriented, appears well and in no acute distress  HEENT: atraumatic, conjunttiva clear, no obvious abnormalities on inspection of external nose and ears  NECK: normal movements of the head and neck  LUNGS: on inspection no signs of respiratory distress, breathing rate appears normal, no obvious gross SOB, gasping or wheezing  CV: no obvious cyanosis  MS: moves all visible extremities without  noticeable abnormality  PSYCH/NEURO: pleasant and cooperative, no obvious depression or anxiety, speech and thought processing grossly intact  ASSESSMENT AND PLAN:  Discussed the following assessment and plan:  Anxiety and depression - Plan: buPROPion (WELLBUTRIN XL) 150 MG 24 hr tablet  Educated About Covid-19 Virus Infection  Anxiety and depression Resuming wellbutrin at the 150 mg XL dose.  Will overlap with celexa for one week,  Then reduce celexa to qod for one week ,  Then stop   Educated About Covid-19 Virus Infection Educated patient on the newly broadened list of signs and symptoms of COVID-19 infection and ways to avoid the viral  infection including washing hands frequently with soap and water,  using hand sanitizer if unable to wash, avoiding touching face,  staying at home and limiting visitors,  and avoiding contact with people coming in and out of home.  Discussed the potential ineffectiveness of hand sanitizer if left in environments > 110 degrees (ie , the car).  Reminded patient to call office with questions/concerns.  The importance of social distancing was discussed today    I discussed the assessment and treatment plan with the patient. The patient was provided an opportunity to ask questions and all were answered. The patient agreed with the plan and demonstrated an understanding of the instructions.   The patient was advised to call back or seek an in-person evaluation if the symptoms worsen or if the condition fails to improve as anticipated.  I provided 25  minutes of non-face-to-face time during this encounter.   Crecencio Mc, MD

## 2019-02-24 NOTE — Assessment & Plan Note (Signed)

## 2019-02-24 NOTE — Assessment & Plan Note (Signed)
Resuming wellbutrin at the 150 mg XL dose.  Will overlap with celexa for one week,  Then reduce celexa to qod for one week ,  Then stop

## 2019-03-02 ENCOUNTER — Telehealth: Admit: 2019-03-02 | Discharge: 2019-03-03 | Payer: MEDICARE

## 2019-03-02 DIAGNOSIS — R197 Diarrhea, unspecified: Principal | ICD-10-CM

## 2019-03-02 DIAGNOSIS — Z515 Encounter for palliative care: Secondary | ICD-10-CM | POA: Diagnosis not present

## 2019-03-20 ENCOUNTER — Ambulatory Visit: Admit: 2019-03-20 | Discharge: 2019-03-21 | Payer: MEDICARE

## 2019-03-20 DIAGNOSIS — Z1231 Encounter for screening mammogram for malignant neoplasm of breast: Secondary | ICD-10-CM | POA: Diagnosis not present

## 2019-03-20 LAB — HM MAMMOGRAPHY

## 2019-03-24 ENCOUNTER — Ambulatory Visit: Admit: 2019-03-24 | Discharge: 2019-03-25 | Payer: MEDICARE

## 2019-03-24 ENCOUNTER — Telehealth
Admit: 2019-03-24 | Discharge: 2019-03-25 | Payer: MEDICARE | Attending: Hematology & Oncology | Primary: Hematology & Oncology

## 2019-03-24 DIAGNOSIS — C3490 Malignant neoplasm of unspecified part of unspecified bronchus or lung: Principal | ICD-10-CM

## 2019-03-24 DIAGNOSIS — I10 Essential (primary) hypertension: Secondary | ICD-10-CM | POA: Diagnosis not present

## 2019-03-24 DIAGNOSIS — Z79899 Other long term (current) drug therapy: Secondary | ICD-10-CM | POA: Diagnosis not present

## 2019-03-24 DIAGNOSIS — M1990 Unspecified osteoarthritis, unspecified site: Secondary | ICD-10-CM | POA: Diagnosis not present

## 2019-03-24 DIAGNOSIS — E039 Hypothyroidism, unspecified: Secondary | ICD-10-CM | POA: Diagnosis not present

## 2019-03-24 DIAGNOSIS — F329 Major depressive disorder, single episode, unspecified: Secondary | ICD-10-CM | POA: Diagnosis not present

## 2019-03-24 DIAGNOSIS — Z87891 Personal history of nicotine dependence: Secondary | ICD-10-CM | POA: Diagnosis not present

## 2019-03-26 ENCOUNTER — Telehealth: Admit: 2019-03-26 | Discharge: 2019-03-27 | Payer: MEDICARE | Attending: Otolaryngology | Primary: Otolaryngology

## 2019-03-26 DIAGNOSIS — J3800 Paralysis of vocal cords and larynx, unspecified: Secondary | ICD-10-CM | POA: Diagnosis not present

## 2019-03-27 ENCOUNTER — Telehealth: Admit: 2019-03-27 | Discharge: 2019-03-28 | Payer: MEDICARE

## 2019-03-27 DIAGNOSIS — Z515 Encounter for palliative care: Secondary | ICD-10-CM | POA: Diagnosis not present

## 2019-03-27 DIAGNOSIS — F419 Anxiety disorder, unspecified: Secondary | ICD-10-CM | POA: Diagnosis not present

## 2019-03-27 DIAGNOSIS — Z79899 Other long term (current) drug therapy: Secondary | ICD-10-CM | POA: Diagnosis not present

## 2019-03-27 DIAGNOSIS — E039 Hypothyroidism, unspecified: Secondary | ICD-10-CM | POA: Diagnosis not present

## 2019-03-27 DIAGNOSIS — I10 Essential (primary) hypertension: Secondary | ICD-10-CM | POA: Diagnosis not present

## 2019-04-06 MED ORDER — LOPERAMIDE 2 MG CAPSULE
ORAL_CAPSULE | 0 refills | 0 days | Status: CP
Start: 2019-04-06 — End: 2019-05-26

## 2019-04-21 ENCOUNTER — Ambulatory Visit: Admit: 2019-04-21 | Discharge: 2019-04-22 | Payer: MEDICARE

## 2019-04-21 DIAGNOSIS — C3490 Malignant neoplasm of unspecified part of unspecified bronchus or lung: Secondary | ICD-10-CM | POA: Diagnosis not present

## 2019-04-23 ENCOUNTER — Telehealth: Admit: 2019-04-23 | Discharge: 2019-04-24 | Payer: MEDICARE

## 2019-04-24 ENCOUNTER — Telehealth: Admit: 2019-04-24 | Discharge: 2019-04-25 | Payer: MEDICARE

## 2019-04-24 DIAGNOSIS — Z515 Encounter for palliative care: Secondary | ICD-10-CM | POA: Diagnosis not present

## 2019-04-24 DIAGNOSIS — R197 Diarrhea, unspecified: Secondary | ICD-10-CM | POA: Diagnosis not present

## 2019-04-30 ENCOUNTER — Other Ambulatory Visit: Payer: Self-pay

## 2019-05-20 ENCOUNTER — Institutional Professional Consult (permissible substitution): Admit: 2019-05-20 | Discharge: 2019-05-21 | Payer: MEDICARE

## 2019-05-20 DIAGNOSIS — Z515 Encounter for palliative care: Secondary | ICD-10-CM | POA: Diagnosis not present

## 2019-05-20 DIAGNOSIS — R197 Diarrhea, unspecified: Secondary | ICD-10-CM | POA: Diagnosis not present

## 2019-05-25 ENCOUNTER — Ambulatory Visit (INDEPENDENT_AMBULATORY_CARE_PROVIDER_SITE_OTHER): Payer: Medicare Other | Admitting: Internal Medicine

## 2019-05-25 ENCOUNTER — Other Ambulatory Visit: Payer: Self-pay

## 2019-05-25 ENCOUNTER — Encounter: Payer: Self-pay | Admitting: Internal Medicine

## 2019-05-25 VITALS — Ht 62.0 in | Wt 174.0 lb

## 2019-05-25 DIAGNOSIS — R7303 Prediabetes: Secondary | ICD-10-CM | POA: Diagnosis not present

## 2019-05-25 DIAGNOSIS — E7800 Pure hypercholesterolemia, unspecified: Secondary | ICD-10-CM

## 2019-05-25 DIAGNOSIS — J309 Allergic rhinitis, unspecified: Secondary | ICD-10-CM | POA: Diagnosis not present

## 2019-05-25 DIAGNOSIS — J3481 Nasal mucositis (ulcerative): Secondary | ICD-10-CM

## 2019-05-25 DIAGNOSIS — I10 Essential (primary) hypertension: Secondary | ICD-10-CM | POA: Diagnosis not present

## 2019-05-25 DIAGNOSIS — E039 Hypothyroidism, unspecified: Secondary | ICD-10-CM

## 2019-05-25 DIAGNOSIS — R5383 Other fatigue: Secondary | ICD-10-CM | POA: Diagnosis not present

## 2019-05-25 DIAGNOSIS — F419 Anxiety disorder, unspecified: Secondary | ICD-10-CM

## 2019-05-25 DIAGNOSIS — F32A Depression, unspecified: Secondary | ICD-10-CM

## 2019-05-25 DIAGNOSIS — F329 Major depressive disorder, single episode, unspecified: Secondary | ICD-10-CM

## 2019-05-25 MED ORDER — MUPIROCIN 2 % EX OINT: 1.0000 "application " | TOPICAL_OINTMENT | Freq: Two times a day (BID) | CUTANEOUS | 0 refills | Status: DC

## 2019-05-25 MED ORDER — CEPHALEXIN 500 MG PO CAPS: 500.0000 mg | ORAL_CAPSULE | Freq: Four times a day (QID) | ORAL | 0 refills | Status: DC

## 2019-05-25 NOTE — Assessment & Plan Note (Signed)
Well controlled on current regimen. Previous reports of orthostasis now resolved Renal function assessment is duey.

## 2019-05-25 NOTE — Assessment & Plan Note (Signed)
Improved with  wellbutrin at the 150 mg XL dose.

## 2019-05-25 NOTE — Assessment & Plan Note (Addendum)
Prescribing topical bactroban and cephalexin .  Warm compresses

## 2019-05-25 NOTE — Assessment & Plan Note (Signed)
Managed with azelastine  .  Avoiding steroids

## 2019-05-25 NOTE — Progress Notes (Signed)
Virtual Visit via Doxy Note  This visit type was conducted due to national recommendations for restrictions regarding the COVID-19 pandemic (e.g. social distancing).  This format is felt to be most appropriate for this patient at this time.  All issues noted in this document were discussed and addressed.  No physical exam was performed (except for noted visual exam findings with Video Visits).   I connected with@ on 05/25/19 at  9:30 AM EDT by a video enabled telemedicine application and verified  that I am speaking with the correct person using two identifiers. Location patient: home Location provider: work or home office Persons participating in the virtual visit: patient, provider  I discussed the limitations, risks, security and privacy concerns of performing an evaluation and management service by telephone and the availability of in person appointments. I also discussed with the patient that there may be a patient responsible charge related to this service. The patient expressed understanding and agreed to proceed.  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  She feels generally well except for diarrhea which is improving with prn use of Imodium and nausea.   Mood is good but the stress of isolation is starting to bother her .  She has been keeping her tow adults sons at a distance given their potential COVID exposures through their work.   Hypertension: patient checks blood pressure twice weekly at home.  Readings have been for the most part <130/80 at rest . Patient is following a reduce salt diet most days and is taking medications as prescribed. Denies orthostasis   Nasal abscess: developed painful swelling inside right nostril, thinks she has an abscess.   The  patient has no signs or symptoms of COVID 19 infection (fever, cough, sore throat  or shortness of breath beyond what is typical for patient).  Patient denies contact with other persons with the above mentioned symptoms or with anyone confirmed to have COVID 19    ROS: See pertinent positives and negatives per HPI.  Past Medical History:  Diagnosis Date  . Allergy    seasonal  . Carpal tunnel syndrome   . Degenerative joint disease   . Depression   . Endometriosis 1989   small amount  . Glaucoma    left eye, Dr. Morrison Old in [place]  . HSV infection   . Hypertension   . Osteoporosis    osteopenia, Fosamax then Boniva 2009  . Vitamin D deficiency    on supplements    Past Surgical History:  Procedure Laterality Date  . APPENDECTOMY  1964   open  . CARPAL TUNNEL RELEASE Right 01/2014   Dr. Malvin Johns  . FOOT NEUROMA SURGERY  May 2016   Left foot  . LAPAROSCOPY  1999  . NASAL SINUS SURGERY  2012   Dr. Pryor Ochoa  . THROAT SURGERY  05/02/2018  . TONSILLECTOMY    . TUBAL LIGATION  1992    Family History  Problem Relation Age of Onset  . Osteoporosis Mother        secondary to steroids  . Rheum arthritis Mother   . Hypertension Mother   . COPD Mother   . Arthritis Mother   . Stroke Mother   . Cancer Father        prostate  .  Depression Son   . Anxiety disorder Son   . ADD / ADHD Son   . ADD / ADHD Son   . Hypertrophic cardiomyopathy Sister   . Schizophrenia Paternal Aunt   . Heart disease Maternal Grandmother   . COPD Maternal Grandfather   . Depression Maternal Uncle     SOCIAL HX:  reports that she quit smoking about 35 years ago. Her smoking use included cigarettes. She has a 16.00 pack-year smoking history. She has never used smokeless tobacco. She reports current alcohol use. She reports that she does not use drugs.   Current Outpatient Medications:  .  azelastine (ASTELIN) 0.1 % nasal spray, PLACE 1 SPRAY INTO BOTH NOSTRILS AT BEDTIME AS NEEDED, Disp: 30 mL, Rfl: 6 .   buPROPion (WELLBUTRIN XL) 150 MG 24 hr tablet, Take 1 tablet (150 mg total) by mouth daily., Disp: 90 tablet, Rfl: 1 .  clonazePAM (KLONOPIN) 0.5 MG tablet, Take 1 tablet (0.5 mg total) by mouth 2 (two) times daily as needed for anxiety., Disp: 60 tablet, Rfl: 5 .  diazepam (VALIUM) 5 MG tablet, Take 1 tablet (5 mg total) by mouth every 12 (twelve) hours as needed for anxiety., Disp: 30 tablet, Rfl: 0 .  diphenoxylate-atropine (LOMOTIL) 2.5-0.025 MG tablet, TK 1 T PO QID PRF DH, Disp: , Rfl:  .  levothyroxine (SYNTHROID, LEVOTHROID) 50 MCG tablet, TAKE 1 TABLET(50 MCG) BY MOUTH DAILY, Disp: 90 tablet, Rfl: 1 .  loperamide (IMODIUM) 2 MG capsule, , Disp: , Rfl:  .  loratadine (CLARITIN) 10 MG tablet, Take 10 mg by mouth daily.  , Disp: , Rfl:  .  losartan (COZAAR) 100 MG tablet, TAKE 1 TABLET(100 MG) BY MOUTH DAILY, Disp: 90 tablet, Rfl: 1 .  metoprolol succinate (TOPROL-XL) 25 MG 24 hr tablet, Take 3 tablets (75 mg total) by mouth daily. Take with or immediately following a meal., Disp: 270 tablet, Rfl: 3 .  Multiple Vitamin (MULTIVITAMIN) tablet, Take 1 tablet by mouth daily., Disp: , Rfl:  .  NERLYNX 40 MG tablet, , Disp: , Rfl:  .  triamcinolone (NASACORT) 55 MCG/ACT AERO nasal inhaler, Place into the nose., Disp: , Rfl:  .  cephALEXin (KEFLEX) 500 MG capsule, Take 1 capsule (500 mg total) by mouth 4 (four) times daily., Disp: 28 capsule, Rfl: 0 .  mupirocin ointment (BACTROBAN) 2 %, Place 1 application into the nose 2 (two) times daily., Disp: 22 g, Rfl: 0  EXAM:  VITALS per patient if applicable:  GENERAL: alert, oriented, appears well and in no acute distress  HEENT: atraumatic, conjunttiva clear, no obvious abnormalities on inspection of external nose and ears  NECK: normal movements of the head and neck  LUNGS: on inspection no signs of respiratory distress, breathing rate appears normal, no obvious gross SOB, gasping or wheezing  CV: no obvious cyanosis  MS: moves all visible  extremities without noticeable abnormality  PSYCH/NEURO: pleasant and cooperative, no obvious depression or anxiety, speech and thought processing grossly intact  ASSESSMENT AND PLAN:  Discussed the following assessment and plan:   Nasal mucositis (ulcerative) Prescribing topical bactroban and cephalexin .  Warm compresses  Hypertension Well controlled on current regimen. Previous reports of orthostasis now resolved Renal function assessment is duey.   Allergic rhinitis Managed with azelastine  .  Avoiding steroids   Anxiety and depression Improved with  wellbutrin at the 150 mg XL dose.      I discussed the assessment and treatment plan with the patient. The patient  was provided an opportunity to ask questions and all were answered. The patient agreed with the plan and demonstrated an understanding of the instructions.   The patient was advised to call back or seek an in-person evaluation if the symptoms worsen or if the condition fails to improve as anticipated.  I provided  25 minutes of non-face-to-face time during this encounter.   Crecencio Mc, MD

## 2019-05-26 MED ORDER — LOPERAMIDE 2 MG CAPSULE
ORAL_CAPSULE | 0 refills | 0 days | Status: CP
Start: 2019-05-26 — End: ?

## 2019-05-28 ENCOUNTER — Ambulatory Visit: Payer: Medicare Other

## 2019-05-28 ENCOUNTER — Other Ambulatory Visit (INDEPENDENT_AMBULATORY_CARE_PROVIDER_SITE_OTHER): Payer: Medicare Other

## 2019-05-28 ENCOUNTER — Other Ambulatory Visit: Payer: Self-pay

## 2019-05-28 DIAGNOSIS — Z23 Encounter for immunization: Secondary | ICD-10-CM

## 2019-05-28 DIAGNOSIS — R7303 Prediabetes: Secondary | ICD-10-CM | POA: Diagnosis not present

## 2019-05-28 DIAGNOSIS — I10 Essential (primary) hypertension: Secondary | ICD-10-CM

## 2019-05-28 DIAGNOSIS — E039 Hypothyroidism, unspecified: Secondary | ICD-10-CM

## 2019-05-28 DIAGNOSIS — E7800 Pure hypercholesterolemia, unspecified: Secondary | ICD-10-CM | POA: Diagnosis not present

## 2019-05-28 DIAGNOSIS — R5383 Other fatigue: Secondary | ICD-10-CM | POA: Diagnosis not present

## 2019-05-28 LAB — COMPREHENSIVE METABOLIC PANEL
ALT: 9 U/L (ref 0–35)
AST: 13 U/L (ref 0–37)
Albumin: 4.5 g/dL (ref 3.5–5.2)
Alkaline Phosphatase: 88 U/L (ref 39–117)
BUN: 22 mg/dL (ref 6–23)
CO2: 24 mEq/L (ref 19–32)
Calcium: 10 mg/dL (ref 8.4–10.5)
Chloride: 105 mEq/L (ref 96–112)
Creatinine, Ser: 1.31 mg/dL — ABNORMAL HIGH (ref 0.40–1.20)
GFR: 40.1 mL/min — ABNORMAL LOW (ref >60.00)
Glucose, Bld: 78 mg/dL (ref 70–99)
Potassium: 3.8 mEq/L (ref 3.5–5.1)
Sodium: 138 mEq/L (ref 135–145)
Total Bilirubin: 0.3 mg/dL (ref 0.2–1.2)
Total Protein: 7.5 g/dL (ref 6.0–8.3)

## 2019-05-28 LAB — CBC WITH DIFFERENTIAL/PLATELET
Basophils Absolute: 0.1 10*3/uL (ref 0.0–0.1)
Basophils Relative: 0.8 % (ref 0.0–3.0)
Eosinophils Absolute: 0.3 10*3/uL (ref 0.0–0.7)
Eosinophils Relative: 4.2 % (ref 0.0–5.0)
HCT: 36.7 % (ref 36.0–46.0)
Hemoglobin: 12.6 g/dL (ref 12.0–15.0)
Lymphocytes Relative: 11.3 % — ABNORMAL LOW (ref 12.0–46.0)
Lymphs Abs: 0.8 10*3/uL (ref 0.7–4.0)
MCHC: 34.2 g/dL (ref 30.0–36.0)
MCV: 87.1 fl (ref 78.0–100.0)
Monocytes Absolute: 0.6 10*3/uL (ref 0.1–1.0)
Monocytes Relative: 8.1 % (ref 3.0–12.0)
Neutro Abs: 5.4 10*3/uL (ref 1.4–7.7)
Neutrophils Relative %: 75.6 % (ref 43.0–77.0)
Platelets: 316 10*3/uL (ref 150.0–400.0)
RBC: 4.22 Mil/uL (ref 3.87–5.11)
RDW: 13.2 % (ref 11.5–15.5)
WBC: 7.1 10*3/uL (ref 4.0–10.5)

## 2019-05-28 LAB — HEMOGLOBIN A1C: Hgb A1c MFr Bld: 5.7 % (ref 4.6–6.5)

## 2019-05-28 LAB — TSH: TSH: 3.26 u[IU]/mL (ref 0.35–4.50)

## 2019-05-28 LAB — LIPID PANEL
Cholesterol: 141 mg/dL (ref 0–200)
HDL: 42.4 mg/dL (ref >39.00)
LDL Cholesterol: 63 mg/dL (ref 0–99)
NonHDL: 99.05
Total CHOL/HDL Ratio: 3
Triglycerides: 180 mg/dL — ABNORMAL HIGH (ref 0.0–149.0)
VLDL: 36 mg/dL (ref 0.0–40.0)

## 2019-05-31 ENCOUNTER — Other Ambulatory Visit: Payer: Self-pay | Admitting: Internal Medicine

## 2019-05-31 DIAGNOSIS — R944 Abnormal results of kidney function studies: Secondary | ICD-10-CM

## 2019-06-17 ENCOUNTER — Institutional Professional Consult (permissible substitution): Admit: 2019-06-17 | Discharge: 2019-06-18 | Payer: MEDICARE

## 2019-06-17 DIAGNOSIS — C3490 Malignant neoplasm of unspecified part of unspecified bronchus or lung: Secondary | ICD-10-CM | POA: Diagnosis not present

## 2019-06-17 DIAGNOSIS — Z515 Encounter for palliative care: Secondary | ICD-10-CM | POA: Diagnosis not present

## 2019-06-17 DIAGNOSIS — R197 Diarrhea, unspecified: Secondary | ICD-10-CM | POA: Diagnosis not present

## 2019-06-30 ENCOUNTER — Ambulatory Visit: Admit: 2019-06-30 | Discharge: 2019-06-30 | Payer: MEDICARE

## 2019-06-30 ENCOUNTER — Ambulatory Visit
Admit: 2019-06-30 | Discharge: 2019-06-30 | Payer: MEDICARE | Attending: Hematology & Oncology | Primary: Hematology & Oncology

## 2019-06-30 DIAGNOSIS — M1990 Unspecified osteoarthritis, unspecified site: Secondary | ICD-10-CM

## 2019-06-30 DIAGNOSIS — R197 Diarrhea, unspecified: Secondary | ICD-10-CM

## 2019-06-30 DIAGNOSIS — E039 Hypothyroidism, unspecified: Secondary | ICD-10-CM

## 2019-06-30 DIAGNOSIS — N179 Acute kidney failure, unspecified: Secondary | ICD-10-CM

## 2019-06-30 DIAGNOSIS — Z902 Acquired absence of lung [part of]: Secondary | ICD-10-CM

## 2019-06-30 DIAGNOSIS — C3412 Malignant neoplasm of upper lobe, left bronchus or lung: Secondary | ICD-10-CM

## 2019-06-30 DIAGNOSIS — C3490 Malignant neoplasm of unspecified part of unspecified bronchus or lung: Secondary | ICD-10-CM

## 2019-06-30 DIAGNOSIS — F329 Major depressive disorder, single episode, unspecified: Secondary | ICD-10-CM | POA: Diagnosis not present

## 2019-06-30 DIAGNOSIS — I10 Essential (primary) hypertension: Secondary | ICD-10-CM | POA: Diagnosis not present

## 2019-06-30 DIAGNOSIS — R21 Rash and other nonspecific skin eruption: Secondary | ICD-10-CM | POA: Diagnosis not present

## 2019-06-30 DIAGNOSIS — Z87891 Personal history of nicotine dependence: Secondary | ICD-10-CM | POA: Diagnosis not present

## 2019-06-30 DIAGNOSIS — R9389 Abnormal findings on diagnostic imaging of other specified body structures: Secondary | ICD-10-CM | POA: Diagnosis not present

## 2019-07-01 ENCOUNTER — Telehealth: Admit: 2019-07-01 | Discharge: 2019-07-02 | Payer: MEDICARE

## 2019-07-14 ENCOUNTER — Institutional Professional Consult (permissible substitution): Admit: 2019-07-14 | Discharge: 2019-07-15 | Payer: MEDICARE

## 2019-07-14 DIAGNOSIS — Z515 Encounter for palliative care: Principal | ICD-10-CM

## 2019-07-14 DIAGNOSIS — C3490 Malignant neoplasm of unspecified part of unspecified bronchus or lung: Secondary | ICD-10-CM | POA: Diagnosis not present

## 2019-07-14 DIAGNOSIS — R197 Diarrhea, unspecified: Secondary | ICD-10-CM | POA: Diagnosis not present

## 2019-07-14 DIAGNOSIS — Z872 Personal history of diseases of the skin and subcutaneous tissue: Secondary | ICD-10-CM | POA: Diagnosis not present

## 2019-07-16 MED ORDER — DIPHENOXYLATE-ATROPINE 2.5 MG-0.025 MG TABLET: tablet | 0 refills | 0 days | Status: AC

## 2019-07-16 MED ORDER — LOPERAMIDE 2 MG CAPSULE: capsule | 0 refills | 0 days | Status: AC

## 2019-07-27 ENCOUNTER — Ambulatory Visit (INDEPENDENT_AMBULATORY_CARE_PROVIDER_SITE_OTHER): Payer: Medicare Other | Admitting: Internal Medicine

## 2019-07-27 ENCOUNTER — Other Ambulatory Visit: Payer: Self-pay

## 2019-07-27 ENCOUNTER — Telehealth: Payer: Self-pay

## 2019-07-27 ENCOUNTER — Encounter: Payer: Self-pay | Admitting: Internal Medicine

## 2019-07-27 VITALS — Ht 62.0 in | Wt 174.0 lb

## 2019-07-27 DIAGNOSIS — Z22322 Carrier or suspected carrier of Methicillin resistant Staphylococcus aureus: Secondary | ICD-10-CM | POA: Insufficient documentation

## 2019-07-27 DIAGNOSIS — L89322 Pressure ulcer of left buttock, stage 2: Secondary | ICD-10-CM | POA: Diagnosis not present

## 2019-07-27 MED ORDER — HYDROCODONE-ACETAMINOPHEN 10-325 MG PO TABS: 1.0000 | ORAL_TABLET | Freq: Four times a day (QID) | ORAL | 0 refills | Status: AC | PRN

## 2019-07-27 MED ORDER — FLUCONAZOLE 150 MG PO TABS: 150.0000 mg | ORAL_TABLET | Freq: Every day | ORAL | 0 refills | Status: DC

## 2019-07-27 MED ORDER — CEPHALEXIN 500 MG PO CAPS: 500.0000 mg | ORAL_CAPSULE | Freq: Four times a day (QID) | ORAL | 0 refills | Status: DC

## 2019-07-27 NOTE — Assessment & Plan Note (Signed)
Presumed given recurrent skin boils/abscesses.    Change to dial soap in showed and weekly hibiclens wash

## 2019-07-27 NOTE — Telephone Encounter (Signed)
Pt is scheduled this morning for a virtual visit.

## 2019-07-27 NOTE — Progress Notes (Signed)
. Virtual Visit via Doxy.me  This visit type was conducted due to national recommendations for restrictions regarding the COVID-19 pandemic (e.g. social distancing).  This format is felt to be most appropriate for this patient at this time.  All issues noted in this document were discussed and addressed.  No physical exam was performed (except for noted visual exam findings with Video Visits).   I connected with@ on 07/27/19 at  8:30 AM EDT by a video enabled telemedicine applicationand verified that I am speaking with the correct person using two identifiers. Location patient: home Location provider: work or home office Persons participating in the virtual visit: patient, provider  I discussed the limitations, risks, security and privacy concerns of performing an evaluation and management service by telephone and the availability of in person appointments. I also discussed with the patient that there may be a patient responsible charge related to this service. The patient expressed understanding and agreed to proceed.  Reason for visit: pressure ulcer,  Sacral ischial  HPI:  68 yr old female on maintenance chemotherapy (neratinib 200 mg daily) for invasive adenocarcinoma of lung Stage IV, chronic iatrogenic diarrhea  Presents with a pressure ulcer of the  Left ischial tuberosity first noticed about 10 days ago which progressed from a painful boil on Wed Oct 21.  She applied warm compresses and tried several times to express pus from 3 enlarging pores, which she did.  However the boil enlarged and became a painful draining open wound with slough and eschar noted in wound  Bed.  Has been applying dressings using incontinence pads .    Patient denies fevers,  Chills and body aches.  Protein stores normal per Sept 29 labs done by [facility] per patient.  Cr slightly elevated at 1.27.  Has been using ibuprofen prn pain.     ROS: See pertinent positives and negatives per HPI.  Past Medical History:   Diagnosis Date  . Allergy    seasonal  . Carpal tunnel syndrome   . Degenerative joint disease   . Depression   . Endometriosis 1989   small amount  . Glaucoma    left eye, Dr. Morrison Old in [place]  . HSV infection   . Hypertension   . Osteoporosis    osteopenia, Fosamax then Boniva 2009  . Vitamin D deficiency    on supplements    Past Surgical History:  Procedure Laterality Date  . APPENDECTOMY  1964   open  . CARPAL TUNNEL RELEASE Right 01/2014   Dr. Malvin Johns  . FOOT NEUROMA SURGERY  May 2016   Left foot  . LAPAROSCOPY  1999  . NASAL SINUS SURGERY  2012   Dr. Pryor Ochoa  . THROAT SURGERY  05/02/2018  . TONSILLECTOMY    . TUBAL LIGATION  1992    Family History  Problem Relation Age of Onset  . Osteoporosis Mother        secondary to steroids  . Rheum arthritis Mother   . Hypertension Mother   . COPD Mother   . Arthritis Mother   . Stroke Mother   . Cancer Father        prostate  . Depression Son   . Anxiety disorder Son   . ADD / ADHD Son   . ADD / ADHD Son   . Hypertrophic cardiomyopathy Sister   . Schizophrenia Paternal Aunt   . Heart disease Maternal Grandmother   . COPD Maternal Grandfather   . Depression Maternal Uncle  SOCIAL HX:  reports that she quit smoking about 35 years ago. Her smoking use included cigarettes. She has a 16.00 pack-year smoking history. She has never used smokeless tobacco. She reports current alcohol use. She reports that she does not use drugs.   Current Outpatient Medications:  .  azelastine (ASTELIN) 0.1 % nasal spray, PLACE 1 SPRAY INTO BOTH NOSTRILS AT BEDTIME AS NEEDED, Disp: 30 mL, Rfl: 6 .  buPROPion (WELLBUTRIN XL) 150 MG 24 hr tablet, Take 1 tablet (150 mg total) by mouth daily., Disp: 90 tablet, Rfl: 1 .  cephALEXin (KEFLEX) 500 MG capsule, Take 1 capsule (500 mg total) by mouth 4 (four) times daily., Disp: 28 capsule, Rfl: 0 .  clonazePAM (KLONOPIN) 0.5 MG tablet, Take 1 tablet (0.5 mg total) by mouth 2 (two) times daily  as needed for anxiety., Disp: 60 tablet, Rfl: 5 .  diazepam (VALIUM) 5 MG tablet, Take 1 tablet (5 mg total) by mouth every 12 (twelve) hours as needed for anxiety., Disp: 30 tablet, Rfl: 0 .  diphenoxylate-atropine (LOMOTIL) 2.5-0.025 MG tablet, TK 1 T PO QID PRF DH, Disp: , Rfl:  .  levothyroxine (SYNTHROID, LEVOTHROID) 50 MCG tablet, TAKE 1 TABLET(50 MCG) BY MOUTH DAILY, Disp: 90 tablet, Rfl: 1 .  loperamide (IMODIUM) 2 MG capsule, , Disp: , Rfl:  .  loratadine (CLARITIN) 10 MG tablet, Take 10 mg by mouth daily.  , Disp: , Rfl:  .  losartan (COZAAR) 100 MG tablet, TAKE 1 TABLET(100 MG) BY MOUTH DAILY, Disp: 90 tablet, Rfl: 1 .  metoprolol succinate (TOPROL-XL) 25 MG 24 hr tablet, Take 3 tablets (75 mg total) by mouth daily. Take with or immediately following a meal., Disp: 270 tablet, Rfl: 3 .  Multiple Vitamin (MULTIVITAMIN) tablet, Take 1 tablet by mouth daily., Disp: , Rfl:  .  mupirocin ointment (BACTROBAN) 2 %, Place 1 application into the nose 2 (two) times daily., Disp: 22 g, Rfl: 0 .  NERLYNX 40 MG tablet, , Disp: , Rfl:  .  triamcinolone (NASACORT) 55 MCG/ACT AERO nasal inhaler, Place into the nose., Disp: , Rfl:  .  fluconazole (DIFLUCAN) 150 MG tablet, Take 1 tablet (150 mg total) by mouth daily., Disp: 2 tablet, Rfl: 0 .  HYDROcodone-acetaminophen (NORCO) 10-325 MG tablet, Take 1 tablet by mouth every 6 (six) hours as needed for up to 7 days for severe pain., Disp: 30 tablet, Rfl: 0  EXAM:  VITALS per patient if applicable:  GENERAL: alert, oriented, appears well and in no acute distress  HEENT: atraumatic, conjunttiva clear, no obvious abnormalities on inspection of external nose and ears  NECK: normal movements of the head and neck  LUNGS: on inspection no signs of respiratory distress, breathing rate appears normal, no obvious gross SOB, gasping or wheezing  CV: no obvious cyanosis  MS: moves all visible extremities without noticeable abnormality  Skin:  3 cm stage  2/3 pressure ulcer left IT with slough in wound bed.   PSYCH/NEURO: pleasant and cooperative, no obvious depression or anxiety, speech and thought processing grossly intact  ASSESSMENT AND PLAN:  Discussed the following assessment and plan:  Pressure injury of left buttock, stage 2 ([place]) - Plan: AMB referral to wound care center  MRSA colonization  Pressure injury of left buttock, stage 2 ([facility]) Continue absorbent dressing changes . Off loading a problems when sleeping due to left pneumonectomy.  Repeat Keflex x 7 days , urgent wound care referral made to [facility]  MRSA colonization Presumed given recurrent  skin boils/abscesses.    Change to dial soap in showed and weekly hibiclens wash    I discussed the assessment and treatment plan with the patient. The patient was provided an opportunity to ask questions and all were answered. The patient agreed with the plan and demonstrated an understanding of the instructions.   The patient was advised to call back or seek an in-person evaluation if the symptoms worsen or if the condition fails to improve as anticipated.  I provided  25 minutes of non-face-to-face time during this encounter reviewing patient's current problems and post surgeries.  Providing counseling on the above mentioned problems , and coordination  of care .   Crecencio Mc, MD

## 2019-07-27 NOTE — Telephone Encounter (Signed)
Copied from [place] [id number]. Topic: General - Other >> Jul 27, 2019  8:11 AM Keene Breath wrote: Reason for CRM: Patient would like to schedule a virtual appt. For today to have the doctor look at a boyle on her bottom.  Patient thinks that it is infectious.  Please call patient back to schedule appt at [phone]

## 2019-07-27 NOTE — Assessment & Plan Note (Signed)
Continue absorbent dressing changes . Off loading a problems when sleeping due to left pneumonectomy.  Repeat Keflex x 7 days , urgent wound care referral made to [facility]

## 2019-07-28 ENCOUNTER — Other Ambulatory Visit: Payer: Self-pay

## 2019-07-28 MED ORDER — METOPROLOL SUCCINATE ER 25 MG PO TB24: 75.0000 mg | ORAL_TABLET | Freq: Every day | ORAL | 3 refills | Status: DC

## 2019-07-30 ENCOUNTER — Telehealth: Admit: 2019-07-30 | Discharge: 2019-07-31 | Payer: MEDICARE

## 2019-07-31 DIAGNOSIS — C3490 Malignant neoplasm of unspecified part of unspecified bronchus or lung: Principal | ICD-10-CM

## 2019-08-02 NOTE — Telephone Encounter (Signed)
MELISSA CAN YOU CHANGE THE REFERRAL FOR WOUND CARE CLINIC TO [facility] WITHout A NEW ORDER?  PATENT CANNOT GET IN AT [facility]  (see my chart messgaesC

## 2019-08-03 NOTE — Telephone Encounter (Signed)
I have changed it to [facility] and sent her a reply stating that it was sent.

## 2019-08-11 ENCOUNTER — Institutional Professional Consult (permissible substitution): Admit: 2019-08-11 | Discharge: 2019-08-12 | Payer: MEDICARE

## 2019-08-11 DIAGNOSIS — Z515 Encounter for palliative care: Secondary | ICD-10-CM | POA: Diagnosis not present

## 2019-08-11 DIAGNOSIS — R197 Diarrhea, unspecified: Secondary | ICD-10-CM | POA: Diagnosis not present

## 2019-08-12 ENCOUNTER — Telehealth: Admit: 2019-08-12 | Discharge: 2019-08-13 | Payer: MEDICARE

## 2019-08-14 ENCOUNTER — Encounter: Payer: Medicare Other | Attending: Physician Assistant | Admitting: Physician Assistant

## 2019-08-14 ENCOUNTER — Other Ambulatory Visit: Payer: Self-pay

## 2019-08-14 DIAGNOSIS — L0231 Cutaneous abscess of buttock: Secondary | ICD-10-CM | POA: Diagnosis not present

## 2019-08-14 DIAGNOSIS — Z902 Acquired absence of lung [part of]: Secondary | ICD-10-CM | POA: Diagnosis not present

## 2019-08-14 DIAGNOSIS — L98412 Non-pressure chronic ulcer of buttock with fat layer exposed: Secondary | ICD-10-CM | POA: Insufficient documentation

## 2019-08-14 DIAGNOSIS — Z8249 Family history of ischemic heart disease and other diseases of the circulatory system: Secondary | ICD-10-CM | POA: Diagnosis not present

## 2019-08-14 DIAGNOSIS — Z809 Family history of malignant neoplasm, unspecified: Secondary | ICD-10-CM | POA: Diagnosis not present

## 2019-08-14 DIAGNOSIS — Z85118 Personal history of other malignant neoplasm of bronchus and lung: Secondary | ICD-10-CM | POA: Diagnosis not present

## 2019-08-14 DIAGNOSIS — I10 Essential (primary) hypertension: Secondary | ICD-10-CM | POA: Diagnosis not present

## 2019-08-17 NOTE — Progress Notes (Signed)
Stacy Santana, Stacy Santana ([id number]) Visit Report for 08/14/2019 Allergy List Details Patient Name: Stacy Santana, Stacy Santana. Date of Service: 08/14/2019 11:30 AM Medical Record Number: [id number] Patient Account Number: [id number] Date of Birth/Sex: 1951-08-20 (68 y.o. F) Treating RN: Army Melia Primary Care Beckey Polkowski: Deborra Medina Other Clinician: Referring Hermes Wafer: Deborra Medina Treating Tywanna Seifer/Extender: STONE III, HOYT Weeks in Treatment: 0 Allergies Active Allergies codine Reaction: nausea Severity: Moderate Allergy Notes Electronic Signature(s) Signed: 08/17/2019 4:22:40 PM By: Army Melia Entered By: Army Melia on 08/14/2019 11:36:00 Stacy Santana ([id number]) -------------------------------------------------------------------------------- Arrival Information Details Patient Name: Stacy Santana. Date of Service: 08/14/2019 11:30 AM Medical Record Number: [id number] Patient Account Number: [id number] Date of Birth/Sex: 1951/06/20 (68 y.o. F) Treating RN: Army Melia Primary Care Hafsa Lohn: Deborra Medina Other Clinician: Referring Katheleen Stella: Deborra Medina Treating Allyne Hebert/Extender: Melburn Hake, HOYT Weeks in Treatment: 0 Visit Information Patient Arrived: Ambulatory Arrival Time: 11:28 Accompanied By: self Transfer Assistance: None Patient Identification Verified: Yes Electronic Signature(s) Signed: 08/17/2019 4:22:40 PM By: Army Melia Entered By: Army Melia on 08/14/2019 11:28:18 Stacy Santana ([id number]) -------------------------------------------------------------------------------- Clinic Level of Care Assessment Details Patient Name: Stacy Santana. Date of Service: 08/14/2019 11:30 AM Medical Record Number: [id number] Patient Account Number: [id number] Date of Birth/Sex: 12/29/1950 (68 y.o. F) Treating RN: Montey Hora Primary Care Khloe Hunkele: Deborra Medina Other Clinician: Referring Esther Bradstreet: Deborra Medina Treating Tamon Parkerson/Extender: Melburn Hake, HOYT Weeks  in Treatment: 0 Clinic Level of Care Assessment Items TOOL 1 Quantity Score []  - Use when EandM and Procedure is performed on INITIAL visit 0 ASSESSMENTS - Nursing Assessment / Reassessment X - General Physical Exam (combine w/ comprehensive assessment (listed just below) when 1 20 performed on new pt. evals) X- 1 25 Comprehensive Assessment (HX, ROS, Risk Assessments, Wounds Hx, etc.) ASSESSMENTS - Wound and Skin Assessment / Reassessment []  - Dermatologic / Skin Assessment (not related to wound area) 0 ASSESSMENTS - Ostomy and/or Continence Assessment and Care []  - Incontinence Assessment and Management 0 []  - 0 Ostomy Care Assessment and Management (repouching, etc.) PROCESS - Coordination of Care X - Simple Patient / Family Education for ongoing care 1 15 []  - 0 Complex (extensive) Patient / Family Education for ongoing care X- 1 10 Staff obtains Programmer, systems, Records, Test Results / Process Orders []  - 0 Staff telephones HHA, Nursing Homes / Clarify orders / etc []  - 0 Routine Transfer to another Facility (non-emergent condition) []  - 0 Routine Hospital Admission (non-emergent condition) X- 1 15 New Admissions / Biomedical engineer / Ordering NPWT, Apligraf, etc. []  - 0 Emergency Hospital Admission (emergent condition) PROCESS - Special Needs []  - Pediatric / Minor Patient Management 0 []  - 0 Isolation Patient Management []  - 0 Hearing / Language / Visual special needs []  - 0 Assessment of Community assistance (transportation, D/C planning, etc.) []  - 0 Additional assistance / Altered mentation X- 1 15 Support Surface(s) Assessment (bed, cushion, seat, etc.) [place], Tanija M. ([id number]) INTERVENTIONS - Miscellaneous []  - External ear exam 0 []  - 0 Patient Transfer (multiple staff / Civil Service fast streamer / Similar devices) []  - 0 Simple Staple / Suture removal (25 or less) []  - 0 Complex Staple / Suture removal (26 or more) []  - 0 Hypo/Hyperglycemic Management (do not  check if billed separately) []  - 0 Ankle / Brachial Index (ABI) - do not check if billed separately Has the patient been seen at the hospital within the last three years: Yes Total Score: 100 Level Of Care: New/Established - Level 3 Electronic Signature(s) Signed: 08/14/2019  1:05:04 PM By: Montey Hora Entered By: Montey Hora on 08/14/2019 12:07:54 Stacy Santana ([id number]) -------------------------------------------------------------------------------- Encounter Discharge Information Details Patient Name: Stacy Santana. Date of Service: 08/14/2019 11:30 AM Medical Record Number: [id number] Patient Account Number: [id number] Date of Birth/Sex: Jan 30, 1951 (68 y.o. F) Treating RN: Montey Hora Primary Care Aundra Espin: Deborra Medina Other Clinician: Referring Meyer Arora: Deborra Medina Treating Lonn Im/Extender: Melburn Hake, HOYT Weeks in Treatment: 0 Encounter Discharge Information Items Post Procedure Vitals Discharge Condition: Stable Temperature (F): 98.8 Ambulatory Status: Ambulatory Pulse (bpm): 101 Discharge Destination: Home Respiratory Rate (breaths/min): 16 Transportation: Private Auto Blood Pressure (mmHg): 144/84 Accompanied By: self Schedule Follow-up Appointment: Yes Clinical Summary of Care: Electronic Signature(s) Signed: 08/14/2019 1:05:04 PM By: Montey Hora Entered By: Montey Hora on 08/14/2019 12:17:43 Stacy Santana ([id number]) -------------------------------------------------------------------------------- Lower Extremity Assessment Details Patient Name: Stacy Santana. Date of Service: 08/14/2019 11:30 AM Medical Record Number: [id number] Patient Account Number: [id number] Date of Birth/Sex: 02/11/1951 (68 y.o. F) Treating RN: Army Melia Primary Care Rickiya Picariello: Deborra Medina Other Clinician: Referring Dailen Mcclish: Deborra Medina Treating Fareed Fung/Extender: Melburn Hake, HOYT Weeks in Treatment: 0 Electronic Signature(s) Signed: 08/17/2019  4:22:40 PM By: Army Melia Entered By: Army Melia on 08/14/2019 11:49:39 Stacy Santana ([id number]) -------------------------------------------------------------------------------- Multi Wound Chart Details Patient Name: Stacy Santana. Date of Service: 08/14/2019 11:30 AM Medical Record Number: [id number] Patient Account Number: [id number] Date of Birth/Sex: 1950-12-07 (68 y.o. F) Treating RN: Montey Hora Primary Care Ebb Carelock: Deborra Medina Other Clinician: Referring Kimani Bedoya: Deborra Medina Treating Narjis Mira/Extender: Melburn Hake, HOYT Weeks in Treatment: 0 Vital Signs Height(in): 61 Pulse(bpm): 101 Weight(lbs): 148 Blood Pressure(mmHg): 144/84 Body Mass Index(BMI): 28 Temperature(F): 98.8 Respiratory Rate 16 (breaths/min): Photos: [N/A:N/A] Wound Location: Left Gluteus N/A N/A Wounding Event: Gradually Appeared N/A N/A Primary Etiology: Abscess N/A N/A Comorbid History: Received Chemotherapy, N/A N/A Received Radiation Date Acquired: 07/23/2019 N/A N/A Weeks of Treatment: 0 N/A N/A Wound Status: Open N/A N/A Measurements L x W x D 2.5x3.4x0.5 N/A N/A (cm) Area (cm) : 6.676 N/A N/A Volume (cm) : 3.338 N/A N/A Classification: Partial Thickness N/A N/A Exudate Amount: Medium N/A N/A Exudate Type: Serosanguineous N/A N/A Exudate Color: red, brown N/A N/A Wound Margin: Flat and Intact N/A N/A Granulation Amount: Small (1-33%) N/A N/A Granulation Quality: Red N/A N/A Necrotic Amount: Large (67-100%) N/A N/A Necrotic Tissue: Eschar, Adherent Slough N/A N/A Exposed Structures: Fat Layer (Subcutaneous N/A N/A Tissue) Exposed: Yes Fascia: No Tendon: No Muscle: No Joint: No Bone: No Epithelialization: None N/A N/A GAIGE, FUSSNER ([id number]) Treatment Notes Electronic Signature(s) Signed: 08/14/2019 1:05:04 PM By: Montey Hora Entered By: Montey Hora on 08/14/2019 12:02:03 Stacy Santana  ([id number]) -------------------------------------------------------------------------------- [place] Details Patient Name: Stacy Santana. Date of Service: 08/14/2019 11:30 AM Medical Record Number: [id number] Patient Account Number: [id number] Date of Birth/Sex: [birth year] (68 y.o. F) Treating RN: Montey Hora Primary Care Dorthea Maina: Deborra Medina Other Clinician: Referring Momoko Slezak: Deborra Medina Treating Beverly Ferner/Extender: Melburn Hake, HOYT Weeks in Treatment: 0 Active Inactive Abuse / Safety / Falls / Self Care Management Nursing Diagnoses: Potential for falls Goals: Patient will remain injury free related to falls Date Initiated: 08/14/2019 Target Resolution Date: 11/07/2019 Goal Status: Active Interventions: Assess fall risk on admission and as needed Notes: Orientation to the Wound Care Program Nursing Diagnoses: Knowledge deficit related to the wound healing center program Goals: Patient/caregiver will verbalize understanding of the [place] Program Date Initiated: 08/14/2019 Target Resolution Date: 11/07/2019 Goal Status: Active Interventions: Provide education on orientation to the  wound center Notes: Pressure Nursing Diagnoses: Potential for impaired tissue integrity related to pressure, friction, moisture, and shear Goals: Patient will remain free from development of additional pressure ulcers Date Initiated: 08/14/2019 Target Resolution Date: 11/07/2019 Goal Status: Active Interventions: Assess potential for pressure ulcer upon admission and as needed MAIZE, [place]. ([id number]) Notes: Wound/Skin Impairment Nursing Diagnoses: Impaired tissue integrity Goals: Ulcer/skin breakdown will heal within 14 weeks Date Initiated: 08/14/2019 Target Resolution Date: 11/07/2019 Goal Status: Active Interventions: Assess patient/caregiver ability to obtain necessary supplies Assess patient/caregiver ability to perform ulcer/skin care  regimen upon admission and as needed Assess ulceration(s) every visit Notes: Electronic Signature(s) Signed: 08/14/2019 1:05:04 PM By: Montey Hora Entered By: Montey Hora on 08/14/2019 12:01:45 Stacy Santana ([id number]) -------------------------------------------------------------------------------- Pain Assessment Details Patient Name: Stacy Santana. Date of Service: 08/14/2019 11:30 AM Medical Record Number: [id number] Patient Account Number: [id number] Date of Birth/Sex: 07/10/1951 (68 y.o. F) Treating RN: Army Melia Primary Care Prisilla Kocsis: Deborra Medina Other Clinician: Referring Kathyjo Briere: Deborra Medina Treating Leon Montoya/Extender: Melburn Hake, HOYT Weeks in Treatment: 0 Active Problems Location of Pain Severity and Description of Pain Patient Has Paino No Site Locations Pain Management and Medication Current Pain Management: Electronic Signature(s) Signed: 08/17/2019 4:22:40 PM By: Army Melia Entered By: Army Melia on 08/14/2019 11:28:26 Stacy Santana ([id number]) -------------------------------------------------------------------------------- Patient/Caregiver Education Details Patient Name: Stacy Santana. Date of Service: 08/14/2019 11:30 AM Medical Record Number: [id number] Patient Account Number: [id number] Date of Birth/Gender: [birth year] (68 y.o. F) Treating RN: Montey Hora Primary Care Physician: Deborra Medina Other Clinician: Referring Physician: Deborra Medina Treating Physician/Extender: Melburn Hake, HOYT Weeks in Treatment: 0 Education Assessment Education Provided To: Patient Education Topics Provided Pressure: Handouts: Preventing Pressure Ulcers Methods: Explain/Verbal Responses: State content correctly Wound/Skin Impairment: Handouts: Other: wound care as ordered Methods: Demonstration, Explain/Verbal Responses: State content correctly Electronic Signature(s) Signed: 08/14/2019 1:05:04 PM By: Montey Hora Entered By: Montey Hora on 08/14/2019 12:06:30 Stacy Santana ([id number]) -------------------------------------------------------------------------------- Wound Assessment Details Patient Name: Stacy Santana. Date of Service: 08/14/2019 11:30 AM Medical Record Number: [id number] Patient Account Number: [id number] Date of Birth/Sex: 05-Mar-1951 (68 y.o. F) Treating RN: Army Melia Primary Care Rogina Schiano: Deborra Medina Other Clinician: Referring Rayquon Uselman: Deborra Medina Treating Donalyn Schneeberger/Extender: Melburn Hake, HOYT Weeks in Treatment: 0 Wound Status Wound Number: 1 Primary Etiology: Abscess Wound Location: Left Gluteus Wound Status: Open Wounding Event: Gradually Appeared Comorbid Received Chemotherapy, Received History: Radiation Date Acquired: 07/23/2019 Weeks Of Treatment: 0 Clustered Wound: No Photos Wound Measurements Length: (cm) 2.5 Width: (cm) 3.4 Depth: (cm) 0.5 Area: (cm) 6.676 Volume: (cm) 3.338 % Reduction in Area: 0% % Reduction in Volume: 0% Epithelialization: None Tunneling: No Undermining: No Wound Description Full Thickness Without Exposed Support Foul O Classification: Structures Slough Wound Margin: Flat and Intact Exudate Medium Amount: Exudate Type: Serosanguineous Exudate Color: red, brown dor After Cleansing: No /Fibrino Yes Wound Bed Granulation Amount: Small (1-33%) Exposed Structure Granulation Quality: Red Fascia Exposed: No Necrotic Amount: Large (67-100%) Fat Layer (Subcutaneous Tissue) Exposed: Yes Necrotic Quality: Eschar, Adherent Slough Tendon Exposed: No Muscle Exposed: No Joint Exposed: No Bone Exposed: No Hopple, Maurie M. ([id number]) Treatment Notes Wound #1 (Left Gluteus) Notes santyl, saline moistened gauze, BFD Electronic Signature(s) Signed: 08/14/2019 12:37:03 PM By: Montey Hora Signed: 08/17/2019 4:22:40 PM By: Army Melia Entered By: Montey Hora on 08/14/2019 12:37:03 Stacy Santana  ([id number]) -------------------------------------------------------------------------------- Vitals Details Patient Name: Stacy Santana. Date of Service: 08/14/2019 11:30 AM Medical Record Number: [id number] Patient Account Number: [id number] Date of Birth/Sex:  1950/10/19 (68 y.o. F) Treating RN: Army Melia Primary Care Kassie Keng: Deborra Medina Other Clinician: Referring Hillari Zumwalt: Deborra Medina Treating Lamiah Marmol/Extender: Melburn Hake, HOYT Weeks in Treatment: 0 Vital Signs Time Taken: 11:28 Temperature (F): 98.8 Height (in): 61 Pulse (bpm): 101 Source: Stated Respiratory Rate (breaths/min): 16 Weight (lbs): 148 Blood Pressure (mmHg): 144/84 Source: Stated Reference Range: 80 - 120 mg / dl Body Mass Index (BMI): 28 Electronic Signature(s) Signed: 08/17/2019 4:22:40 PM By: Army Melia Entered By: Army Melia on 08/14/2019 11:29:39

## 2019-08-17 NOTE — Progress Notes (Signed)
LAPORSCHE, HOEGER ([id number]) Visit Report for 08/14/2019 Chief Complaint Document Details Patient Name: Stacy Santana, Stacy Santana. Date of Service: 08/14/2019 11:30 AM Medical Record Number: [id number] Patient Account Number: [id number] Date of Birth/Sex: 05/19/1951 (68 y.o. F) Treating RN: Montey Hora Primary Care Provider: Deborra Medina Other Clinician: Referring Provider: Deborra Medina Treating Provider/Extender: Melburn Hake, HOYT Weeks in Treatment: 0 Information Obtained from: Patient Chief Complaint Left buttock ulcer and abscess Electronic Signature(s) Signed: 08/14/2019 11:56:57 AM By: Worthy Keeler PA-C Entered By: Worthy Keeler on 08/14/2019 11:56:57 [place], Stacy Donath ([id number]) -------------------------------------------------------------------------------- Debridement Details Patient Name: Stacy Santana. Date of Service: 08/14/2019 11:30 AM Medical Record Number: [id number] Patient Account Number: [id number] Date of Birth/Sex: 1950/11/29 (68 y.o. F) Treating RN: Montey Hora Primary Care Provider: Deborra Medina Other Clinician: Referring Provider: Deborra Medina Treating Provider/Extender: Melburn Hake, HOYT Weeks in Treatment: 0 Debridement Performed for Wound #1 Left Gluteus Assessment: Performed By: Physician STONE III, HOYT E., PA-C Debridement Type: Debridement Level of Consciousness (Pre- Awake and Alert procedure): Pre-procedure Verification/Time Yes - 12:08 Out Taken: Start Time: 12:08 Pain Control: Lidocaine 4% Topical Solution Total Area Debrided (L x W): 2.5 (cm) x 3.4 (cm) = 8.5 (cm) Tissue and other material Viable, Non-Viable, Slough, Subcutaneous, Slough debrided: Level: Skin/Subcutaneous Tissue Debridement Description: Excisional Instrument: Curette Bleeding: Minimum Hemostasis Achieved: Pressure End Time: 12:11 Procedural Pain: 0 Post Procedural Pain: 0 Response to Treatment: Procedure was tolerated well Level of Consciousness Awake and  Alert (Post-procedure): Post Debridement Measurements of Total Wound Length: (cm) 2.5 Width: (cm) 3.4 Depth: (cm) 0.8 Volume: (cm) 5.341 Character of Wound/Ulcer Post Debridement: Improved Post Procedure Diagnosis Same as Pre-procedure Electronic Signature(s) Signed: 08/14/2019 1:05:04 PM By: Montey Hora Signed: 08/14/2019 3:30:31 PM By: Worthy Keeler PA-C Entered By: Montey Hora on 08/14/2019 12:11:38 Stacy Santana ([id number]) -------------------------------------------------------------------------------- HPI Details Patient Name: Stacy Santana. Date of Service: 08/14/2019 11:30 AM Medical Record Number: [id number] Patient Account Number: [id number] Date of Birth/Sex: 1951/02/02 (68 y.o. F) Treating RN: Montey Hora Primary Care Provider: Deborra Medina Other Clinician: Referring Provider: Deborra Medina Treating Provider/Extender: Melburn Hake, HOYT Weeks in Treatment: 0 History of Present Illness HPI Description: 08/14/2019 on evaluation today patient presents for initial evaluation here in our clinic secondary to a wound that she has in the left gluteal region. She states that this started as an abscess. She was originally concerned about the induration and pain that she experienced when she was actually on a trip for her [place] to the mountains. She states that this subsequently ended up opening up in the night right on the day that they plan to come home the next morning. She had a lot of bleeding and it even got on the sheets because it drained so much. Since that time she has been trying to manage this she actually did get a prescription for Santyl prescribed for her but unfortunately it took some time for her to get it she has just been using it for a few days. Subsequently she does feel like that is helped a little bit but she still has a lot of necrotic tissue she feels like needs to be removed off of the surface of the wound in order to allow this to heal.  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 Unfortunately a year ago they also noted that she  had another small lump in the lung on the right although they have been treating this and apparently it seems to be stable over the past year she tells me. With that being said that still something of a concern for her at this point. No fevers, chills, nausea, vomiting, or diarrhea. She tells me that around the time that she began the treatment for her lung cancer she noted that her skin was more prone to abscesses and infection and she has had a lot of issues as such. She has used Hibiclens once a week most of the time in order to try to cut back on this per her primary care provider's recommendation. Electronic Signature(s) Signed: 08/14/2019 3:03:33 PM By: Worthy Keeler PA-C Entered By: Worthy Keeler on 08/14/2019 15:03:33 Stacy Santana ([id number]) -------------------------------------------------------------------------------- Physical Exam Details Patient Name: Stacy Santana. Date of Service: 08/14/2019 11:30 AM Medical Record Number: [id number] Patient Account Number: [id number] Date of Birth/Sex: 1951/08/01 (68 y.o. F) Treating RN: Montey Hora Primary Care Provider: Deborra Medina Other Clinician: Referring Provider: Deborra Medina Treating Provider/Extender: Melburn Hake, HOYT Weeks in Treatment: 0 Constitutional patient is hypertensive.. pulse regular and within target range for patient.[name] respirations regular, non-labored and within target range for patient.[name] temperature within target range for patient.. Well-nourished and well-hydrated in no acute distress. Eyes conjunctiva clear no eyelid edema noted. pupils equal round and reactive to light and accommodation. Ears, Nose, Mouth, and Throat no gross abnormality of ear auricles or  external auditory canals. normal hearing noted during conversation. mucus membranes moist. Respiratory normal breathing without difficulty. clear to auscultation bilaterally. Cardiovascular regular rate and rhythm with normal S1, S2. no clubbing, cyanosis, significant edema, <3 sec cap refill. Gastrointestinal (GI) soft, non-tender, non-distended, +BS. no ventral hernia noted. Musculoskeletal normal gait and posture. no significant deformity or arthritic changes, no loss or range of motion, no clubbing. Psychiatric this patient is able to make decisions and demonstrates good insight into disease process. Alert and Oriented x 3. pleasant and cooperative. Notes With regard to the patient's wound bed there was necrotic tissue noted on the surface of the wound which actually did require sharp debridement today to remove this in preparation for application of appropriate dressings. With that being said she did have some discomfort with sharp debridement but fortunately this was nothing to significant at this point. I was able to clean away most of necrotic tissue although there was a little left and it became somewhat tender so we did opt to continue with the Santyl for 1 additional week. At that point I really feel like will be ready to switch to something else possibly a silver alginate dressing or potentially even Hydrofera Blue depending on if there is still some hyper granulation noted at that point. Electronic Signature(s) Signed: 08/14/2019 3:04:29 PM By: Worthy Keeler PA-C Entered By: Worthy Keeler on 08/14/2019 15:04:28 Stacy Santana ([id number]) -------------------------------------------------------------------------------- Physician Orders Details Patient Name: Stacy Santana. Date of Service: 08/14/2019 11:30 AM Medical Record Number: [id number] Patient Account Number: [id number] Date of Birth/Sex: July 01, 1951 (68 y.o. F) Treating RN: Montey Hora Primary Care Provider:  Deborra Medina Other Clinician: Referring Provider: Deborra Medina Treating Provider/Extender: Melburn Hake, HOYT Weeks in Treatment: 0 Verbal / Phone Orders: No Diagnosis Coding ICD-10 Coding Code Description L02.31 Cutaneous abscess of buttock L98.412 Non-pressure chronic ulcer of buttock with fat layer exposed Z85.118 Personal history of other malignant neoplasm of bronchus and lung Wound Cleansing Wound #1 Left Gluteus o Clean wound  with Normal Saline. o Dial antibacterial soap, wash wounds, rinse and pat dry prior to dressing wounds - or Hibiclense o May Shower, gently pat wound dry prior to applying new dressing. Anesthetic (add to Medication List) Wound #1 Left Gluteus o Topical Lidocaine 4% cream applied to wound bed prior to debridement (In Clinic Only). Primary Wound Dressing Wound #1 Left Gluteus o Santyl Ointment Secondary Dressing Wound #1 Left Gluteus o Saline moistened gauze o Boardered Foam Dressing Dressing Change Frequency Wound #1 Left Gluteus o Change dressing every day. Follow-up Appointments Wound #1 Left Gluteus o Return Appointment in 1 week. Electronic Signature(s) Signed: 08/14/2019 1:05:04 PM By: Montey Hora Signed: 08/14/2019 3:30:31 PM By: Worthy Keeler PA-C Entered By: Montey Hora on 08/14/2019 12:15:25 Stacy Santana ([id number]) -------------------------------------------------------------------------------- Problem List Details Patient Name: Stacy Santana. Date of Service: 08/14/2019 11:30 AM Medical Record Number: [id number] Patient Account Number: [id number] Date of Birth/Sex: [birth year] (68 y.o. F) Treating RN: Montey Hora Primary Care Provider: Deborra Medina Other Clinician: Referring Provider: Deborra Medina Treating Provider/Extender: Melburn Hake, HOYT Weeks in Treatment: 0 Active Problems ICD-10 Evaluated Encounter Code Description Active Date Today Diagnosis L02.31 Cutaneous abscess of buttock  08/14/2019 No Yes L98.412 Non-pressure chronic ulcer of buttock with fat layer exposed 08/14/2019 No Yes Z85.118 Personal history of other malignant neoplasm of bronchus 08/14/2019 No Yes and lung Inactive Problems Resolved Problems Electronic Signature(s) Signed: 08/14/2019 11:56:23 AM By: Worthy Keeler PA-C Entered By: Worthy Keeler on 08/14/2019 11:56:22 Santana, Stacy Donath ([id number]) -------------------------------------------------------------------------------- Progress Note Details Patient Name: Stacy Santana. Date of Service: 08/14/2019 11:30 AM Medical Record Number: [id number] Patient Account Number: [id number] Date of Birth/Sex: Dec 17, 1950 (68 y.o. F) Treating RN: Montey Hora Primary Care Provider: Deborra Medina Other Clinician: Referring Provider: Deborra Medina Treating Provider/Extender: Melburn Hake, HOYT Weeks in Treatment: 0 Subjective Chief Complaint Information obtained from Patient Left buttock ulcer and abscess History of Present Illness (HPI) 08/14/2019 on evaluation today patient presents for initial evaluation here in our clinic secondary to a wound that she has in the left gluteal region. She states that this started as an abscess. She was originally concerned about the induration and pain that she experienced when she was actually on a trip for her anniversary to the mountains. She states that this subsequently ended up opening up in the night right on the day that they plan to come home the next morning. She had a lot of bleeding and it even got on the sheets because it drained so much. Since that time she has been trying to manage this she actually did get a prescription for Santyl prescribed for her but unfortunately it took some time for her to get it she has just been using it for a few days. Subsequently she does feel like that is helped a little bit but she still has a lot of necrotic tissue she feels like needs to be removed off of the surface of  the wound in order to allow this to heal. 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 Unfortunately a year ago they also noted that she had another small lump in the lung on the right although they have been treating this and apparently it seems to be stable over the past year she tells me. With that being said that still something of a concern  for her at this point. No fevers, chills, nausea, vomiting, or diarrhea. She tells me that around the time that she began the treatment for her lung cancer she noted that her skin was more prone to abscesses and infection and she has had a lot of issues as such. She has used Hibiclens once a week most of the time in order to try to cut back on this per her primary care provider's recommendation. Patient History Information obtained from Patient. Allergies codine (Severity: Moderate, Reaction: nausea) Family History Cancer - Father, Hypertension - Mother,Maternal Grandparents, Lung Disease - Mother, Stroke - Mother, No family history of Diabetes, Heart Disease, Kidney Disease, Seizures, Thyroid Problems, Tuberculosis. Social History Never smoker, Marital Status - Married, Alcohol Use - Rarely, Drug Use - No History, Caffeine Use - Daily. Medical History Eyes Denies history of Cataracts, Glaucoma, Optic Neuritis Ear/Nose/Mouth/Throat Denies history of Chronic sinus problems/congestion, Middle ear problems Hematologic/Lymphatic Denies history of Anemia, Hemophilia, Human Immunodeficiency Virus, Lymphedema, Sickle Cell Disease Respiratory Denies history of Aspiration, Asthma, Chronic Obstructive Pulmonary Disease (COPD), Pneumothorax, Sleep Apnea, Tuberculosis Santana, Stacy M. ([id number]) Cardiovascular Denies history of Angina, Arrhythmia, Congestive Heart Failure, Coronary Artery Disease, Deep Vein  Thrombosis, Hypertension, Hypotension, Myocardial Infarction, Peripheral Arterial Disease, Peripheral Venous Disease, Phlebitis, Vasculitis Gastrointestinal Denies history of Cirrhosis , Colitis, Crohn s, Hepatitis A, Hepatitis B, Hepatitis C Endocrine Denies history of Type I Diabetes, Type II Diabetes Genitourinary Denies history of End Stage Renal Disease Immunological Denies history of Lupus Erythematosus, Raynaud s, Scleroderma Integumentary (Skin) Denies history of History of Burn, History of pressure wounds Musculoskeletal Denies history of Gout, Rheumatoid Arthritis, Osteoarthritis, Osteomyelitis Neurologic Denies history of Dementia, Neuropathy, Quadriplegia, Paraplegia, Seizure Disorder Oncologic Patient has history of Received Chemotherapy, Received Radiation Psychiatric Denies history of Anorexia/bulimia, Confinement Anxiety Medical And Surgical History Notes Oncologic 30 treatments Review of Systems (ROS) Constitutional Symptoms (General Health) Denies complaints or symptoms of Fatigue, Fever, Chills, Marked Weight Change. Eyes Denies complaints or symptoms of Dry Eyes, Vision Changes, Glasses / Contacts. Ear/Nose/Mouth/Throat Denies complaints or symptoms of Difficult clearing ears, Sinusitis, Lung cancer Left lung removed Hematologic/Lymphatic Denies complaints or symptoms of Bleeding / Clotting Disorders, Human Immunodeficiency Virus. Respiratory Denies complaints or symptoms of Chronic or frequent coughs, Shortness of Breath, Lung cancer 2017 Cardiovascular Denies complaints or symptoms of Chest pain, LE edema. Gastrointestinal Denies complaints or symptoms of Frequent diarrhea, Nausea, Vomiting. Endocrine Complains or has symptoms of Thyroid disease. Denies complaints or symptoms of Hepatitis, Polydypsia (Excessive Thirst), thyroxine Genitourinary Denies complaints or symptoms of Kidney failure/ Dialysis, Incontinence/dribbling, creatinine issues after  chemo Immunological Denies complaints or symptoms of Hives, Itching. Integumentary (Skin) Complains or has symptoms of Wounds. Denies complaints or symptoms of Bleeding or bruising tendency, Breakdown, Swelling. Musculoskeletal Denies complaints or symptoms of Muscle Pain, Muscle Weakness. Neurologic Denies complaints or symptoms of Numbness/parasthesias, Focal/Weakness. Oncologic Lung cancer 2017 Psychiatric Denies complaints or symptoms of Anxiety, Claustrophobia. Stacy Santana, Stacy M. ([id number]) Objective Constitutional patient is hypertensive.. pulse regular and within target range for patient.[name] respirations regular, non-labored and within target range for patient.[name] temperature within target range for patient.. Well-nourished and well-hydrated in no acute distress. Vitals Time Taken: 11:28 AM, Height: 61 in, Source: Stated, Weight: 148 lbs, Source: Stated, BMI: 28, Temperature: 98.8 F, Pulse: 101 bpm, Respiratory Rate: 16 breaths/min, Blood Pressure: 144/84 mmHg. Eyes conjunctiva clear no eyelid edema noted. pupils equal round and reactive to light and accommodation. Ears, Nose, Mouth, and Throat no gross abnormality of ear auricles or external  auditory canals. normal hearing noted during conversation. mucus membranes moist. Respiratory normal breathing without difficulty. clear to auscultation bilaterally. Cardiovascular regular rate and rhythm with normal S1, S2. no clubbing, cyanosis, significant edema, Gastrointestinal (GI) soft, non-tender, non-distended, +BS. no ventral hernia noted. Musculoskeletal normal gait and posture. no significant deformity or arthritic changes, no loss or range of motion, no clubbing. Psychiatric this patient is able to make decisions and demonstrates good insight into disease process. Alert and Oriented x 3. pleasant and cooperative. General Notes: With regard to the patient's wound bed there was necrotic tissue noted on the surface of the wound  which actually did require sharp debridement today to remove this in preparation for application of appropriate dressings. With that being said she did have some discomfort with sharp debridement but fortunately this was nothing to significant at this point. I was able to clean away most of necrotic tissue although there was a little left and it became somewhat tender so we did opt to continue with the Santyl for 1 additional week. At that point I really feel like will be ready to switch to something else possibly a silver alginate dressing or potentially even Hydrofera Blue depending on if there is still some hyper granulation noted at that point. Integumentary (Hair, Skin) Wound #1 status is Open. Original cause of wound was Gradually Appeared. The wound is located on the Left Gluteus. The wound measures 2.5cm length x 3.4cm width x 0.5cm depth; 6.676cm^2 area and 3.338cm^3 volume. There is Fat Layer (Subcutaneous Tissue) Exposed exposed. There is no tunneling or undermining noted. There is a medium amount of serosanguineous drainage noted. The wound margin is flat and intact. There is small (1-33%) red granulation within the wound bed. There is a large (67-100%) amount of necrotic tissue within the wound bed including Eschar and Adherent Slough. SARIA, HARAN M. ([id number]) Assessment Active Problems ICD-10 Cutaneous abscess of buttock Non-pressure chronic ulcer of buttock with fat layer exposed Personal history of other malignant neoplasm of bronchus and lung Procedures Wound #1 Pre-procedure diagnosis of Wound #1 is an Abscess located on the Left Gluteus . There was a Excisional Skin/Subcutaneous Tissue Debridement with a total area of 8.5 sq cm performed by STONE III, HOYT E., PA-C. With the following instrument(s): Curette to remove Viable and Non-Viable tissue/material. Material removed includes Subcutaneous Tissue and Slough and after achieving pain control using Lidocaine 4%  Topical Solution. No specimens were taken. A time out was conducted at 12:08, prior to the start of the procedure. A Minimum amount of bleeding was controlled with Pressure. The procedure was tolerated well with a pain level of 0 throughout and a pain level of 0 following the procedure. Post Debridement Measurements: 2.5cm length x 3.4cm width x 0.8cm depth; 5.341cm^3 volume. Character of Wound/Ulcer Post Debridement is improved. Post procedure Diagnosis Wound #1: Same as Pre-Procedure Plan Wound Cleansing: Wound #1 Left Gluteus: Clean wound with Normal Saline. Dial antibacterial soap, wash wounds, rinse and pat dry prior to dressing wounds - or Hibiclense May Shower, gently pat wound dry prior to applying new dressing. Anesthetic (add to Medication List): Wound #1 Left Gluteus: Topical Lidocaine 4% cream applied to wound bed prior to debridement (In Clinic Only). Primary Wound Dressing: Wound #1 Left Gluteus: Santyl Ointment Secondary Dressing: Wound #1 Left Gluteus: Saline moistened gauze Boardered Foam Dressing Dressing Change Frequency: Wound #1 Left Gluteus: Change dressing every day. Follow-up Appointments: Wound #1 Left Gluteus: Return Appointment in 1 week. [place] Santana, Stacy M. ([id number]) 1.  My suggestion at this time is good to be that we continue with Santyl with a saline moistened gauze packed in behind this and then covered with a bordered foam dressing. I am also going to recommend that the patient change this daily which she has been doing anyway. 2. I am also going to suggest for the patient at this point based on what we are seeing that we go ahead and have her continue with appropriate offloading she has done an excellent job with that she is very familiar with the practice and therefore I feel like there is really no evidence of pressure injury at this point nor any concerns or issues in my opinion. 3. I do not see any evidence of infection at this point I do not  believe she requires any antibiotics currently although if any of that changes we will definitely address it as we need to. We will see patient back for reevaluation in 1 week here in the clinic. If anything worsens or changes patient will contact our office for additional recommendations. Electronic Signature(s) Signed: 08/14/2019 3:05:57 PM By: Worthy Keeler PA-C Entered By: Worthy Keeler on 08/14/2019 15:05:57 Stacy Santana ([id number]) -------------------------------------------------------------------------------- ROS/PFSH Details Patient Name: Stacy Santana. Date of Service: 08/14/2019 11:30 AM Medical Record Number: [id number] Patient Account Number: [id number] Date of Birth/Sex: May 23, 1951 (68 y.o. F) Treating RN: Army Melia Primary Care Provider: Deborra Medina Other Clinician: Referring Provider: Deborra Medina Treating Provider/Extender: Melburn Hake, HOYT Weeks in Treatment: 0 Information Obtained From Patient Constitutional Symptoms (General Health) Complaints and Symptoms: Negative for: Fatigue; Fever; Chills; Marked Weight Change Eyes Complaints and Symptoms: Negative for: Dry Eyes; Vision Changes; Glasses / Contacts Medical History: Negative for: Cataracts; Glaucoma; Optic Neuritis Ear/Nose/Mouth/Throat Complaints and Symptoms: Negative for: Difficult clearing ears; Sinusitis Review of System Notes: Lung cancer Left lung removed Medical History: Negative for: Chronic sinus problems/congestion; Middle ear problems Hematologic/Lymphatic Complaints and Symptoms: Negative for: Bleeding / Clotting Disorders; Human Immunodeficiency Virus Medical History: Negative for: Anemia; Hemophilia; Human Immunodeficiency Virus; Lymphedema; Sickle Cell Disease Respiratory Complaints and Symptoms: Negative for: Chronic or frequent coughs; Shortness of Breath Review of System Notes: Lung cancer 2017 Medical History: Negative for: Aspiration; Asthma; Chronic Obstructive  Pulmonary Disease (COPD); Pneumothorax; Sleep Apnea; Tuberculosis Cardiovascular Complaints and Symptoms: Negative for: Chest pain; LE edema Stacy Santana, LABARBERA. ([id number]) Medical History: Negative for: Angina; Arrhythmia; Congestive Heart Failure; Coronary Artery Disease; Deep Vein Thrombosis; Hypertension; Hypotension; Myocardial Infarction; Peripheral Arterial Disease; Peripheral Venous Disease; Phlebitis; Vasculitis Gastrointestinal Complaints and Symptoms: Negative for: Frequent diarrhea; Nausea; Vomiting Medical History: Negative for: Cirrhosis ; Colitis; Crohnos; Hepatitis A; Hepatitis B; Hepatitis C Endocrine Complaints and Symptoms: Positive for: Thyroid disease Negative for: Hepatitis; Polydypsia (Excessive Thirst) Review of System Notes: thyroxine Medical History: Negative for: Type I Diabetes; Type II Diabetes Genitourinary Complaints and Symptoms: Negative for: Kidney failure/ Dialysis; Incontinence/dribbling Review of System Notes: creatinine issues after chemo Medical History: Negative for: End Stage Renal Disease Immunological Complaints and Symptoms: Negative for: Hives; Itching Medical History: Negative for: Lupus Erythematosus; Raynaudos; Scleroderma Integumentary (Skin) Complaints and Symptoms: Positive for: Wounds Negative for: Bleeding or bruising tendency; Breakdown; Swelling Medical History: Negative for: History of Burn; History of pressure wounds Musculoskeletal Complaints and Symptoms: Negative for: Muscle Pain; Muscle Weakness Medical History: Negative for: Gout; Rheumatoid Arthritis; Osteoarthritis; Osteomyelitis Santana, Stacy M. ([id number]) Neurologic Complaints and Symptoms: Negative for: Numbness/parasthesias; Focal/Weakness Medical History: Negative for: Dementia; Neuropathy; Quadriplegia; Paraplegia; Seizure Disorder Psychiatric Complaints and Symptoms: Negative for: Anxiety;  Claustrophobia Medical History: Negative for:  Anorexia/bulimia; Confinement Anxiety Oncologic Complaints and Symptoms: Review of System Notes: Lung cancer 2017 Medical History: Positive for: Received Chemotherapy; Received Radiation Past Medical History Notes: 30 treatments Immunizations Pneumococcal Vaccine: Received Pneumococcal Vaccination: Yes Implantable Devices None Family and Social History Cancer: Yes - Father; Diabetes: No; Heart Disease: No; Hypertension: Yes - Mother,Maternal Grandparents; Kidney Disease: No; Lung Disease: Yes - Mother; Seizures: No; Stroke: Yes - Mother; Thyroid Problems: No; Tuberculosis: No; Never smoker; Marital Status - Married; Alcohol Use: Rarely; Drug Use: No History; Caffeine Use: Daily; Financial Concerns: No; Food, Clothing or Shelter Needs: No; Support System Lacking: No; Transportation Concerns: No Electronic Signature(s) Signed: 08/14/2019 3:30:31 PM By: Worthy Keeler PA-C Signed: 08/17/2019 4:22:40 PM By: Army Melia Entered By: Army Melia on 08/14/2019 11:43:12 Stacy Santana ([id number]) -------------------------------------------------------------------------------- [place] Details Patient Name: Stacy Santana. Date of Service: 08/14/2019 Medical Record Number: [id number] Patient Account Number: [id number] Date of Birth/Sex: 06-28-1951 (68 y.o. F) Treating RN: Montey Hora Primary Care Provider: Deborra Medina Other Clinician: Referring Provider: Deborra Medina Treating Provider/Extender: Melburn Hake, HOYT Weeks in Treatment: 0 Diagnosis Coding ICD-10 Codes Code Description L02.31 Cutaneous abscess of buttock L98.412 Non-pressure chronic ulcer of buttock with fat layer exposed Z85.118 Personal history of other malignant neoplasm of bronchus and lung Facility Procedures CPT4 Code: 13244010 Description: [place] VISIT-LEV 3 EST PT Modifier: Quantity: 1 CPT4 Code: 27253664 Description: 11042 - DEB SUBQ TISSUE 20 SQ CM/< ICD-10 Diagnosis Description L98.412  Non-pressure chronic ulcer of buttock with fat layer expos Modifier: ed Quantity: 1 Physician Procedures CPT4 Code: 4034742 Description: WC PHYS LEVEL 3 o NEW PT ICD-10 Diagnosis Description L02.31 Cutaneous abscess of buttock L98.412 Non-pressure chronic ulcer of buttock with fat layer expose Z85.118 Personal history of other malignant neoplasm of bronchus an Modifier: 25 d d lung Quantity: 1 CPT4 Code: 5956387 Description: 56433 - WC PHYS SUBQ TISS 20 SQ CM ICD-10 Diagnosis Description 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

## 2019-08-17 NOTE — Progress Notes (Signed)
QUINETTE, HENTGES ([id number]) Visit Report for 08/14/2019 Abuse/Suicide Risk Screen Details Patient Name: Stacy Santana, Stacy Santana. Date of Service: 08/14/2019 11:30 AM Medical Record Number: [id number] Patient Account Number: [id number] Date of Birth/Sex: 1951-08-03 (68 y.o. F) Treating RN: Army Melia Primary Care Mariena Meares: Deborra Medina Other Clinician: Referring Quinne Pires: Deborra Medina Treating Shellene Sweigert/Extender: Melburn Hake, HOYT Weeks in Treatment: 0 Abuse/Suicide Risk Screen Items Answer ABUSE RISK SCREEN: Has anyone close to you tried to hurt or harm you recentlyo No Do you feel uncomfortable with anyone in your familyo No Has anyone forced you do things that you didnot want to doo No Electronic Signature(s) Signed: 08/17/2019 4:22:40 PM By: Army Melia Entered By: Army Melia on 08/14/2019 11:43:19 Hinckley, Stacy Santana ([id number]) -------------------------------------------------------------------------------- Activities of Daily Living Details Patient Name: Stacy Santana. Date of Service: 08/14/2019 11:30 AM Medical Record Number: [id number] Patient Account Number: [id number] Date of Birth/Sex: [birth year] (68 y.o. F) Treating RN: Army Melia Primary Care Rease Swinson: Deborra Medina Other Clinician: Referring Danyela Posas: Deborra Medina Treating Marion Rosenberry/Extender: Melburn Hake, HOYT Weeks in Treatment: 0 Activities of Daily Living Items Answer Activities of Daily Living (Please select one for each item) Drive Automobile Completely Able Take Medications Completely Able Use Telephone Completely Able Care for Appearance Completely Able Use Toilet Completely Able Bath / Shower Completely Able Dress Self Completely Able Feed Self Completely Able Walk Completely Able Get In / Out Bed Completely Able Housework Completely Able Prepare Meals Completely Able Handle Money Completely Able Shop for Self Completely Able Electronic Signature(s) Signed: 08/17/2019 4:22:40 PM By: Army Melia Entered By: Army Melia on 08/14/2019 11:43:29 Stacy Santana ([id number]) -------------------------------------------------------------------------------- Education Screening Details Patient Name: Stacy Santana. Date of Service: 08/14/2019 11:30 AM Medical Record Number: [id number] Patient Account Number: [id number] Date of Birth/Sex: May 11, 1951 (68 y.o. F) Treating RN: Army Melia Primary Care Tahje Borawski: Deborra Medina Other Clinician: Referring Jarin Cornfield: Deborra Medina Treating Misbah Hornaday/Extender: Melburn Hake, HOYT Weeks in Treatment: 0 Primary Learner Assessed: Patient Learning Preferences/Education Level/Primary Language Learning Preference: Explanation, Demonstration Highest Education Level: College or Above Preferred Language: English Cognitive Barrier Language Barrier: No Translator Needed: No Memory Deficit: No Emotional Barrier: No Cultural/Religious Beliefs Affecting Medical Care: No Physical Barrier Impaired Vision: No Impaired Hearing: No Decreased Hand dexterity: No Knowledge/Comprehension Knowledge Level: High Comprehension Level: High Ability to understand written High instructions: Ability to understand verbal High instructions: Motivation Anxiety Level: Calm Cooperation: Cooperative Education Importance: Acknowledges Need Interest in Health Problems: Asks Questions Perception: Coherent Willingness to Engage in Self- High Management Activities: Readiness to Engage in Self- High Management Activities: Electronic Signature(s) Signed: 08/17/2019 4:22:40 PM By: Army Melia Entered By: Army Melia on 08/14/2019 11:43:49 Stacy Santana ([id number]) -------------------------------------------------------------------------------- Fall Risk Assessment Details Patient Name: Stacy Santana. Date of Service: 08/14/2019 11:30 AM Medical Record Number: [id number] Patient Account Number: [id number] Date of Birth/Sex: Apr 25, 1951 (68 y.o. F) Treating  RN: Army Melia Primary Care Brylin [name]: Deborra Medina Other Clinician: Referring Colie Josten: Deborra Medina Treating Judithann Villamar/Extender: Melburn Hake, HOYT Weeks in Treatment: 0 Fall Risk Assessment Items Have you had 2 or more falls in the last 12 monthso 0 No Have you had any fall that resulted in injury in the last 12 monthso 0 No FALLS RISK SCREEN History of falling - immediate or within 3 months 0 No Secondary diagnosis (Do you have 2 or more medical diagnoseso) 0 No Ambulatory aid None/bed rest/wheelchair/nurse 0 No Crutches/cane/walker 0 No Furniture 0 No Intravenous therapy Access/Saline/Heparin Lock 0 No Gait/Transferring Normal/ bed rest/ wheelchair 0 No  Weak (short steps with or without shuffle, stooped but able to lift head while 0 No walking, may seek support from furniture) Impaired (short steps with shuffle, may have difficulty arising from chair, head 0 No down, impaired balance) Mental Status Oriented to own ability 0 No Electronic Signature(s) Signed: 08/17/2019 4:22:40 PM By: Army Melia Entered By: Army Melia on 08/14/2019 11:43:54 Gallicchio, Stacy Santana ([id number]) -------------------------------------------------------------------------------- Nutrition Risk Screening Details Patient Name: Stacy Santana. Date of Service: 08/14/2019 11:30 AM Medical Record Number: [id number] Patient Account Number: [id number] Date of Birth/Sex: 09-12-1951 (68 y.o. F) Treating RN: Army Melia Primary Care Sora Olivo: Deborra Medina Other Clinician: Referring Jannat Rosemeyer: Deborra Medina Treating Genesia Caslin/Extender: Melburn Hake, HOYT Weeks in Treatment: 0 Height (in): 61 Weight (lbs): 148 Body Mass Index (BMI): 28 Nutrition Risk Screening Items Score Screening NUTRITION RISK SCREEN: I have an illness or condition that made me change the kind and/or amount of 0 No food I eat I eat fewer than two meals per day 0 No I eat few fruits and vegetables, or milk products 0 No I have three or  more drinks of beer, liquor or wine almost every day 0 No I have tooth or mouth problems that make it hard for me to eat 0 No I don't always have enough money to buy the food I need 0 No I eat alone most of the time 0 No I take three or more different prescribed or over-the-counter drugs a day 0 No Without wanting to, I have lost or gained 10 pounds in the last six months 0 No I am not always physically able to shop, cook and/or feed myself 0 No Nutrition Protocols Good Risk Protocol 0 No interventions needed Moderate Risk Protocol High Risk Proctocol Risk Level: Good Risk Score: 0 Electronic Signature(s) Signed: 08/17/2019 4:22:40 PM By: Army Melia Entered By: Army Melia on 08/14/2019 11:44:01

## 2019-08-18 ENCOUNTER — Ambulatory Visit
Admit: 2019-08-18 | Discharge: 2019-08-19 | Payer: MEDICARE | Attending: Hematology & Oncology | Primary: Hematology & Oncology

## 2019-08-18 DIAGNOSIS — C3492 Malignant neoplasm of unspecified part of left bronchus or lung: Secondary | ICD-10-CM | POA: Diagnosis not present

## 2019-08-18 MED ORDER — MINOCYCLINE 100 MG TABLET
ORAL_TABLET | Freq: Two times a day (BID) | ORAL | 0 refills | 60 days | Status: CP
Start: 2019-08-18 — End: ?

## 2019-08-18 MED ORDER — ALCLOMETASONE 0.05 % TOPICAL CREAM
Freq: Two times a day (BID) | TOPICAL | 0 refills | 0 days | Status: CP
Start: 2019-08-18 — End: 2020-08-17

## 2019-08-19 DIAGNOSIS — Z79899 Other long term (current) drug therapy: Principal | ICD-10-CM

## 2019-08-19 DIAGNOSIS — C3492 Malignant neoplasm of unspecified part of left bronchus or lung: Principal | ICD-10-CM

## 2019-08-19 DIAGNOSIS — L270 Generalized skin eruption due to drugs and medicaments taken internally: Principal | ICD-10-CM

## 2019-08-19 DIAGNOSIS — R918 Other nonspecific abnormal finding of lung field: Principal | ICD-10-CM

## 2019-08-19 MED ORDER — MINOCYCLINE 100 MG CAPSULE
ORAL_CAPSULE | Freq: Two times a day (BID) | ORAL | 2 refills | 60 days | Status: CP
Start: 2019-08-19 — End: 2019-10-18

## 2019-08-21 ENCOUNTER — Other Ambulatory Visit: Payer: Self-pay

## 2019-08-21 ENCOUNTER — Encounter: Payer: Medicare Other | Admitting: Physician Assistant

## 2019-08-21 DIAGNOSIS — L98412 Non-pressure chronic ulcer of buttock with fat layer exposed: Secondary | ICD-10-CM | POA: Diagnosis not present

## 2019-08-21 DIAGNOSIS — Z902 Acquired absence of lung [part of]: Secondary | ICD-10-CM | POA: Diagnosis not present

## 2019-08-21 DIAGNOSIS — L0231 Cutaneous abscess of buttock: Secondary | ICD-10-CM | POA: Diagnosis not present

## 2019-08-21 DIAGNOSIS — Z8249 Family history of ischemic heart disease and other diseases of the circulatory system: Secondary | ICD-10-CM | POA: Diagnosis not present

## 2019-08-21 DIAGNOSIS — Z85118 Personal history of other malignant neoplasm of bronchus and lung: Secondary | ICD-10-CM | POA: Diagnosis not present

## 2019-08-21 DIAGNOSIS — I10 Essential (primary) hypertension: Secondary | ICD-10-CM | POA: Diagnosis not present

## 2019-08-21 DIAGNOSIS — Z809 Family history of malignant neoplasm, unspecified: Secondary | ICD-10-CM | POA: Diagnosis not present

## 2019-08-21 NOTE — Progress Notes (Addendum)
STEPHANINE, REAS ([id number]) Visit Report for 08/21/2019 Chief Complaint Document Details Patient Name: Stacy Santana, Stacy Santana. Date of Service: 08/21/2019 9:00 AM Medical Record Number: [id number] Patient Account Number: [id number] Date of Birth/Sex: January 14, 1951 (68 y.o. F) Treating RN: Montey Hora Primary Care Provider: Deborra Medina Other Clinician: Referring Provider: Deborra Medina Treating Provider/Extender: Melburn Hake, Oma Alpert Weeks in Treatment: 1 Information Obtained from: Patient Chief Complaint Left buttock ulcer and abscess Electronic Signature(s) Signed: 08/21/2019 9:12:36 AM By: Worthy Keeler PA-C Entered By: Worthy Keeler on 08/21/2019 09:12:35 Stacy Santana ([id number]) -------------------------------------------------------------------------------- Debridement Details Patient Name: Stacy Santana. Date of Service: 08/21/2019 9:00 AM Medical Record Number: [id number] Patient Account Number: [id number] Date of Birth/Sex: [birth year] (68 y.o. F) Treating RN: Montey Hora Primary Care Provider: Deborra Medina Other Clinician: Referring Provider: Deborra Medina Treating Provider/Extender: Melburn Hake, Rayshawn Visconti Weeks in Treatment: 1 Debridement Performed for Wound #1 Left Gluteus Assessment: Performed By: Physician STONE III, Raoul Ciano E., PA-C Debridement Type: Debridement Level of Consciousness (Pre- Awake and Alert procedure): Pre-procedure Verification/Time Yes - 09:19 Out Taken: Start Time: 09:19 Pain Control: Lidocaine 4% Topical Solution Total Area Debrided (L x W): 2.3 (cm) x 3.2 (cm) = 7.36 (cm) Tissue and other material Viable, Non-Viable, Slough, Subcutaneous, Slough debrided: Level: Skin/Subcutaneous Tissue Debridement Description: Excisional Instrument: Curette Bleeding: Minimum Hemostasis Achieved: Pressure End Time: 09:21 Procedural Pain: 0 Post Procedural Pain: 0 Response to Treatment: Procedure was tolerated well Level of Consciousness Awake and  Alert (Post-procedure): Post Debridement Measurements of Total Wound Length: (cm) 2.3 Width: (cm) 3.2 Depth: (cm) 0.4 Volume: (cm) 2.312 Character of Wound/Ulcer Post Debridement: Improved Post Procedure Diagnosis Same as Pre-procedure Electronic Signature(s) Signed: 08/21/2019 1:21:18 PM By: Montey Hora Signed: 08/21/2019 1:53:04 PM By: Worthy Keeler PA-C Entered By: Montey Hora on 08/21/2019 09:21:49 Stacy Santana ([id number]) -------------------------------------------------------------------------------- HPI Details Patient Name: Stacy Santana. Date of Service: 08/21/2019 9:00 AM Medical Record Number: [id number] Patient Account Number: [id number] Date of Birth/Sex: [birth year] (68 y.o. F) Treating RN: Montey Hora Primary Care Provider: Deborra Medina Other Clinician: Referring Provider: Deborra Medina Treating Provider/Extender: Melburn Hake, Garima Chronis Weeks in Treatment: 1 History of Present Illness HPI Description: 08/14/2019 on evaluation today patient presents for initial evaluation here in our clinic secondary to a wound that she has in the left gluteal region. She states that this started as an abscess. She was originally concerned about the induration and pain that she experienced when she was actually on a trip for her [place] to the [place]. She states that this subsequently ended up opening up in the night right on the day that they plan to come home the next morning. She had a lot of bleeding and it even got on the sheets because it drained so much. Since that time she has been trying to manage this she actually did get a prescription for Santyl prescribed for her but unfortunately it took some time for her to get it she has just been using it for a few days. Subsequently she does feel like that is helped a little bit but she still has a lot of necrotic tissue she feels like needs to be removed off of the surface of the wound in order to allow this to heal.  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 Unfortunately a year ago they also noted that she  had another small lump in the lung on the right although they have been treating this and apparently it seems to be stable over the past year she tells me. With that being said that still something of a concern for her at this point. No fevers, chills, nausea, vomiting, or diarrhea. She tells me that around the time that she began the treatment for her lung cancer she noted that her skin was more prone to abscesses and infection and she has had a lot of issues as such. She has used Hibiclens once a week most of the time in order to try to cut back on this per her primary care provider's recommendation. 08/21/2019 on evaluation today patient appears to be doing quite well with regard to her wound as compared to last evaluation. She has been tolerating the dressing changes without complication. Fortunately there is no signs of active infection at this time. No fevers, chills, nausea, vomiting, or diarrhea. She has a little bit of hyper granular tissue noted which is likely secondary to the fact that to be honest the Annitta Needs is providing a little bit too much moisture at this time. Electronic Signature(s) Signed: 08/21/2019 10:33:28 AM By: Worthy Keeler PA-C Entered By: Worthy Keeler on 08/21/2019 10:33:27 Stacy Santana ([id number]) -------------------------------------------------------------------------------- Physical Exam Details Patient Name: Stacy Santana. Date of Service: 08/21/2019 9:00 AM Medical Record Number: [id number] Patient Account Number: [id number] Date of Birth/Sex: [birth year] (68 y.o. F) Treating RN: Montey Hora Primary Care Provider: Deborra Medina Other Clinician: Referring Provider: Deborra Medina Treating Provider/Extender:  STONE III, Clydie Dillen Weeks in Treatment: 1 Constitutional Well-nourished and well-hydrated in no acute distress. Respiratory normal breathing without difficulty. Psychiatric this patient is able to make decisions and demonstrates good insight into disease process. Alert and Oriented x 3. pleasant and cooperative. Notes Upon inspection today patient's wound bed actually showed signs of good granulation other than the slight hyper granular tissue I did have to perform sharp debridement clear away some of this necrotic tissue from the surface of the wound as well as the poorly viable tissue that was hyper granular. She tolerated that without complication post debridement the wound bed actually appears to be doing much better which is great news. I think that Hydrofera Blue will be a much better dressing for her at this time. Electronic Signature(s) Signed: 08/21/2019 10:33:55 AM By: Worthy Keeler PA-C Entered By: Worthy Keeler on 08/21/2019 10:33:55 Stacy Santana ([id number]) -------------------------------------------------------------------------------- Physician Orders Details Patient Name: Stacy Santana. Date of Service: 08/21/2019 9:00 AM Medical Record Number: [id number] Patient Account Number: [id number] Date of Birth/Sex: 30-Jun-1951 (68 y.o. F) Treating RN: Montey Hora Primary Care Provider: Deborra Medina Other Clinician: Referring Provider: Deborra Medina Treating Provider/Extender: Melburn Hake, Declynn Lopresti Weeks in Treatment: 1 Verbal / Phone Orders: No Diagnosis Coding ICD-10 Coding Code Description L02.31 Cutaneous abscess of buttock L98.412 Non-pressure chronic ulcer of buttock with fat layer exposed Z85.118 Personal history of other malignant neoplasm of bronchus and lung Wound Cleansing Wound #1 Left Gluteus o Clean wound with Normal Saline. o Dial antibacterial soap, wash wounds, rinse and pat dry prior to dressing wounds - or Hibiclense o May Shower, gently pat  wound dry prior to applying new dressing. Anesthetic (add to Medication List) Wound #1 Left Gluteus o Topical Lidocaine 4% cream applied to wound bed prior to debridement (In Clinic Only). Primary Wound Dressing Wound #1 Left Gluteus o Hydrafera Blue Ready Transfer Secondary Dressing Wound #1 Left Gluteus o Boardered  Foam Dressing Dressing Change Frequency Wound #1 Left Gluteus o Change dressing every other day. Follow-up Appointments Wound #1 Left Gluteus o Return Appointment in 2 weeks. Electronic Signature(s) Signed: 08/21/2019 1:21:18 PM By: Montey Hora Signed: 08/21/2019 1:53:04 PM By: Worthy Keeler PA-C Entered By: Montey Hora on 08/21/2019 09:18:32 Stacy Santana ([id number]) -------------------------------------------------------------------------------- Problem List Details Patient Name: Stacy Santana. Date of Service: 08/21/2019 9:00 AM Medical Record Number: [id number] Patient Account Number: [id number] Date of Birth/Sex: [birth year] (68 y.o. F) Treating RN: Montey Hora Primary Care Provider: Deborra Medina Other Clinician: Referring Provider: Deborra Medina Treating Provider/Extender: Melburn Hake, Nahuel Wilbert Weeks in Treatment: 1 Active Problems ICD-10 Evaluated Encounter Code Description Active Date Today Diagnosis L02.31 Cutaneous abscess of buttock 08/14/2019 No Yes L98.412 Non-pressure chronic ulcer of buttock with fat layer exposed 08/14/2019 No Yes Z85.118 Personal history of other malignant neoplasm of bronchus 08/14/2019 No Yes and lung Inactive Problems Resolved Problems Electronic Signature(s) Signed: 08/21/2019 9:12:28 AM By: Worthy Keeler PA-C Entered By: Worthy Keeler on 08/21/2019 09:12:28 Stacy Santana ([id number]) -------------------------------------------------------------------------------- Progress Note Details Patient Name: Stacy Santana. Date of Service: 08/21/2019 9:00 AM Medical Record Number: [id number] Patient  Account Number: [id number] Date of Birth/Sex: 1951-05-24 (68 y.o. F) Treating RN: Montey Hora Primary Care Provider: Deborra Medina Other Clinician: Referring Provider: Deborra Medina Treating Provider/Extender: Melburn Hake, [facility] Weeks in Treatment: 1 Subjective Chief Complaint Information obtained from Patient Left buttock ulcer and abscess History of Present Illness (HPI) 08/14/2019 on evaluation today patient presents for initial evaluation here in our clinic secondary to a wound that she has in the left gluteal region. She states that this started as an abscess. She was originally concerned about the induration and pain that she experienced when she was actually on a trip for her anniversary to the mountains. She states that this subsequently ended up opening up in the night right on the day that they plan to come home the next morning. She had a lot of bleeding and it even got on the sheets because it drained so much. Since that time she has been trying to manage this she actually did get a prescription for Santyl prescribed for her but unfortunately it took some time for her to get it she has just been using it for a few days. Subsequently she does feel like that is helped a little bit but she still has a lot of necrotic tissue she feels like needs to be removed off of the surface of the wound in order to allow this to heal. 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 Unfortunately a year ago they also noted that she had another small lump in the lung on the right although they have been treating this and apparently it seems to be stable over the past year she tells me. With that being said that still something of a concern for her at this point. No fevers, chills, nausea, vomiting, or diarrhea. She tells me that around the time that she  began the treatment for her lung cancer she noted that her skin was more prone to abscesses and infection and she has had a lot of issues as such. She has used Hibiclens once a week most of the time in order to try to cut back on this per her primary care provider's recommendation. 08/21/2019  on evaluation today patient appears to be doing quite well with regard to her wound as compared to last evaluation. She has been tolerating the dressing changes without complication. Fortunately there is no signs of active infection at this time. No fevers, chills, nausea, vomiting, or diarrhea. She has a little bit of hyper granular tissue noted which is likely secondary to the fact that to be honest the Annitta Needs is providing a little bit too much moisture at this time. Patient History Information obtained from Patient. Family History Cancer - Father, Hypertension - Mother,Maternal Grandparents, Lung Disease - Mother, Stroke - Mother, No family history of Diabetes, Heart Disease, Kidney Disease, Seizures, Thyroid Problems, Tuberculosis. Social History Never smoker, Marital Status - Married, Alcohol Use - Rarely, Drug Use - No History, Caffeine Use - Daily. Medical History Eyes Denies history of Cataracts, Glaucoma, Optic Neuritis Ear/Nose/Mouth/Throat Denies history of Chronic sinus problems/congestion, Middle ear problems Hematologic/Lymphatic Denies history of Anemia, Hemophilia, Human Immunodeficiency Virus, Lymphedema, Sickle Cell Disease Respiratory Denies history of Aspiration, Asthma, Chronic Obstructive Pulmonary Disease (COPD), Pneumothorax, Sleep Apnea, Mcgough, Stacy M. ([id number]) Tuberculosis Cardiovascular Denies history of Angina, Arrhythmia, Congestive Heart Failure, Coronary Artery Disease, Deep Vein Thrombosis, Hypertension, Hypotension, Myocardial Infarction, Peripheral Arterial Disease, Peripheral Venous Disease, Phlebitis, Vasculitis Gastrointestinal Denies history of Cirrhosis ,  Colitis, Crohn s, Hepatitis A, Hepatitis B, Hepatitis C Endocrine Denies history of Type I Diabetes, Type II Diabetes Genitourinary Denies history of End Stage Renal Disease Immunological Denies history of Lupus Erythematosus, Raynaud s, Scleroderma Integumentary (Skin) Denies history of History of Burn, History of pressure wounds Musculoskeletal Denies history of Gout, Rheumatoid Arthritis, Osteoarthritis, Osteomyelitis Neurologic Denies history of Dementia, Neuropathy, Quadriplegia, Paraplegia, Seizure Disorder Oncologic Patient has history of Received Chemotherapy, Received Radiation Psychiatric Denies history of Anorexia/bulimia, Confinement Anxiety Medical And Surgical History Notes Oncologic 30 treatments Review of Systems (ROS) Constitutional Symptoms (General Health) Denies complaints or symptoms of Fatigue, Fever, Chills, Marked Weight Change. Respiratory Denies complaints or symptoms of Chronic or frequent coughs, Shortness of Breath. Cardiovascular Denies complaints or symptoms of Chest pain, LE edema. Psychiatric Denies complaints or symptoms of Anxiety, Claustrophobia. Objective Constitutional Well-nourished and well-hydrated in no acute distress. Vitals Time Taken: 9:06 AM, Height: 61 in, Weight: 148 lbs, BMI: 28, Temperature: 98.4 F, Pulse: 83 bpm, Respiratory Rate: 16 breaths/min, Blood Pressure: 136/75 mmHg. Respiratory normal breathing without difficulty. Psychiatric Stacy Santana, Stacy Santana ([id number]) this patient is able to make decisions and demonstrates good insight into disease process. Alert and Oriented x 3. pleasant and cooperative. General Notes: Upon inspection today patient's wound bed actually showed signs of good granulation other than the slight hyper granular tissue I did have to perform sharp debridement clear away some of this necrotic tissue from the surface of the wound as well as the poorly viable tissue that was hyper granular. She tolerated  that without complication post debridement the wound bed actually appears to be doing much better which is great news. I think that Hydrofera Blue will be a much better dressing for her at this time. Integumentary (Hair, Skin) Wound #1 status is Open. Original cause of wound was Gradually Appeared. The wound is located on the Left Gluteus. The wound measures 2.3cm length x 3.2cm width x 0.2cm depth; 5.781cm^2 area and 1.156cm^3 volume. There is Fat Layer (Subcutaneous Tissue) Exposed exposed. There is no tunneling or undermining noted. There is a medium amount of serosanguineous drainage noted. The wound margin is flat and intact. There is large (67-100%) red granulation within the  wound bed. There is a small (1-33%) amount of necrotic tissue within the wound bed including Adherent Slough. Assessment Active Problems ICD-10 Cutaneous abscess of buttock Non-pressure chronic ulcer of buttock with fat layer exposed Personal history of other malignant neoplasm of bronchus and lung Procedures Wound #1 Pre-procedure diagnosis of Wound #1 is an Abscess located on the Left Gluteus . There was a Excisional Skin/Subcutaneous Tissue Debridement with a total area of 7.36 sq cm performed by STONE III, Zarin Knupp E., PA-C. With the following instrument (s): Curette to remove Viable and Non-Viable tissue/material. Material removed includes Subcutaneous Tissue and Slough and after achieving pain control using Lidocaine 4% Topical Solution. No specimens were taken. A time out was conducted at 09:19, prior to the start of the procedure. A Minimum amount of bleeding was controlled with Pressure. The procedure was tolerated well with a pain level of 0 throughout and a pain level of 0 following the procedure. Post Debridement Measurements: 2.3cm length x 3.2cm width x 0.4cm depth; 2.312cm^3 volume. Character of Wound/Ulcer Post Debridement is improved. Post procedure Diagnosis Wound #1: Same as  Pre-Procedure Plan Wound Cleansing: Wound #1 Left Gluteus: Clean wound with Normal Saline. Dial antibacterial soap, wash wounds, rinse and pat dry prior to dressing wounds - or Hibiclense Stacy Santana, Stacy Santana ([id number]) May Shower, gently pat wound dry prior to applying new dressing. Anesthetic (add to Medication List): Wound #1 Left Gluteus: Topical Lidocaine 4% cream applied to wound bed prior to debridement (In Clinic Only). Primary Wound Dressing: Wound #1 Left Gluteus: Hydrafera Blue Ready Transfer Secondary Dressing: Wound #1 Left Gluteus: Boardered Foam Dressing Dressing Change Frequency: Wound #1 Left Gluteus: Change dressing every other day. Follow-up Appointments: Wound #1 Left Gluteus: Return Appointment in 2 weeks. 1. My suggestion currently is that we switch to [facility] dressing the patient is in agreement with that plan. 2. I would also recommend that we go ahead and have her change this dressing every 2 to 3 days as opposed to daily that will be much easier on her in general as well. 3. I am also going to recommend that we have her continue with appropriate offloading to prevent anything from worsening. We will see patient back for reevaluation in 2 weeks here in the clinic. If anything worsens or changes patient will contact our office for additional recommendations. Electronic Signature(s) Signed: 08/21/2019 10:34:28 AM By: Worthy Keeler PA-C Entered By: Worthy Keeler on 08/21/2019 10:34:28 Stacy Santana ([id number]) -------------------------------------------------------------------------------- ROS/PFSH Details Patient Name: Stacy Santana. Date of Service: 08/21/2019 9:00 AM Medical Record Number: [id number] Patient Account Number: [id number] Date of Birth/Sex: Jul 22, 1951 (68 y.o. F) Treating RN: Montey Hora Primary Care Provider: Deborra Medina Other Clinician: Referring Provider: Deborra Medina Treating Provider/Extender: Melburn Hake, Donja Tipping Weeks  in Treatment: 1 Information Obtained From Patient Constitutional Symptoms (General Health) Complaints and Symptoms: Negative for: Fatigue; Fever; Chills; Marked Weight Change Respiratory Complaints and Symptoms: Negative for: Chronic or frequent coughs; Shortness of Breath Medical History: Negative for: Aspiration; Asthma; Chronic Obstructive Pulmonary Disease (COPD); Pneumothorax; Sleep Apnea; Tuberculosis Cardiovascular Complaints and Symptoms: Negative for: Chest pain; LE edema Medical History: Negative for: Angina; Arrhythmia; Congestive Heart Failure; Coronary Artery Disease; Deep Vein Thrombosis; Hypertension; Hypotension; Myocardial Infarction; Peripheral Arterial Disease; Peripheral Venous Disease; Phlebitis; Vasculitis Psychiatric Complaints and Symptoms: Negative for: Anxiety; Claustrophobia Medical History: Negative for: Anorexia/bulimia; Confinement Anxiety Eyes Medical History: Negative for: Cataracts; Glaucoma; Optic Neuritis Ear/Nose/Mouth/Throat Medical History: Negative for: Chronic sinus problems/congestion; Middle ear problems Hematologic/Lymphatic Medical History:  Negative for: Anemia; Hemophilia; Human Immunodeficiency Virus; Lymphedema; Sickle Cell Disease Stacy Santana, MASTERS. ([id number]) Gastrointestinal Medical History: Negative for: Cirrhosis ; Colitis; Crohnos; Hepatitis A; Hepatitis B; Hepatitis C Endocrine Medical History: Negative for: Type I Diabetes; Type II Diabetes Genitourinary Medical History: Negative for: End Stage Renal Disease Immunological Medical History: Negative for: Lupus Erythematosus; Raynaudos; Scleroderma Integumentary (Skin) Medical History: Negative for: History of Burn; History of pressure wounds Musculoskeletal Medical History: Negative for: Gout; Rheumatoid Arthritis; Osteoarthritis; Osteomyelitis Neurologic Medical History: Negative for: Dementia; Neuropathy; Quadriplegia; Paraplegia; Seizure  Disorder Oncologic Medical History: Positive for: Received Chemotherapy; Received Radiation Past Medical History Notes: 30 treatments Immunizations Pneumococcal Vaccine: Received Pneumococcal Vaccination: Yes Implantable Devices None Family and Social History Cancer: Yes - Father; Diabetes: No; Heart Disease: No; Hypertension: Yes - Mother,Maternal Grandparents; Kidney Disease: No; Lung Disease: Yes - Mother; Seizures: No; Stroke: Yes - Mother; Thyroid Problems: No; Tuberculosis: No; Never smoker; Marital Status - Married; Alcohol Use: Rarely; Drug Use: No History; Caffeine Use: Daily; Financial Concerns: No; Food, Clothing or Shelter Needs: No; Support System Lacking: No; Transportation Concerns: No Physician Affirmation I have reviewed and agree with the above information. Electronic Signature(s) Stacy Santana, Stacy Santana [place]. ([id number]) Signed: 08/21/2019 1:21:18 PM By: Montey Hora Signed: 08/21/2019 1:53:04 PM By: Worthy Keeler PA-C Entered By: Worthy Keeler on 08/21/2019 10:33:42 Stacy Santana ([id number]) -------------------------------------------------------------------------------- SuperBill Details Patient Name: Stacy Santana. Date of Service: 08/21/2019 Medical Record Number: [id number] Patient Account Number: [id number] Date of Birth/Sex: 06/29/1951 (68 y.o. F) Treating RN: Montey Hora Primary Care Provider: Deborra Medina Other Clinician: Referring Provider: Deborra Medina Treating Provider/Extender: Melburn Hake, Deivi Huckins Weeks in Treatment: 1 Diagnosis Coding ICD-10 Codes Code Description L02.31 Cutaneous abscess of buttock L98.412 Non-pressure chronic ulcer of buttock with fat layer exposed Z85.118 Personal history of other malignant neoplasm of bronchus and lung Facility Procedures CPT4 Code: [id number] Description: 24097 - DEB SUBQ TISSUE 20 SQ CM/< ICD-10 Diagnosis Description L98.412 Non-pressure chronic ulcer of buttock with fat layer expo Modifier: sed Quantity:  1 Physician Procedures CPT4 Code: 3532992 Description: 42683 - WC PHYS SUBQ TISS 20 SQ CM ICD-10 Diagnosis Description L98.412 Non-pressure chronic ulcer of buttock with fat layer expo Modifier: sed Quantity: 1 Electronic Signature(s) Signed: 08/21/2019 10:34:37 AM By: Worthy Keeler PA-C Entered By: Worthy Keeler on 08/21/2019 10:34:37

## 2019-08-25 DIAGNOSIS — C3490 Malignant neoplasm of unspecified part of unspecified bronchus or lung: Principal | ICD-10-CM

## 2019-08-25 MED ORDER — NERATINIB 40 MG TABLET
ORAL_TABLET | Freq: Every day | ORAL | 9 refills | 36 days | Status: CP
Start: 2019-08-25 — End: ?

## 2019-09-01 DIAGNOSIS — H2513 Age-related nuclear cataract, bilateral: Secondary | ICD-10-CM | POA: Diagnosis not present

## 2019-09-02 ENCOUNTER — Telehealth: Admit: 2019-09-02 | Discharge: 2019-09-03 | Payer: MEDICARE

## 2019-09-03 ENCOUNTER — Encounter: Payer: Medicare Other | Attending: Physician Assistant | Admitting: Physician Assistant

## 2019-09-03 ENCOUNTER — Other Ambulatory Visit: Payer: Self-pay

## 2019-09-03 DIAGNOSIS — L98412 Non-pressure chronic ulcer of buttock with fat layer exposed: Secondary | ICD-10-CM | POA: Diagnosis not present

## 2019-09-03 DIAGNOSIS — L0231 Cutaneous abscess of buttock: Secondary | ICD-10-CM | POA: Diagnosis not present

## 2019-09-03 DIAGNOSIS — F419 Anxiety disorder, unspecified: Secondary | ICD-10-CM

## 2019-09-03 DIAGNOSIS — Z85118 Personal history of other malignant neoplasm of bronchus and lung: Secondary | ICD-10-CM | POA: Insufficient documentation

## 2019-09-03 MED ORDER — BUPROPION HCL ER (XL) 150 MG PO TB24: 150.0000 mg | ORAL_TABLET | Freq: Every day | ORAL | 1 refills | Status: DC

## 2019-09-03 NOTE — Progress Notes (Addendum)
Stacy Santana, Stacy Santana ([id number]) Visit Report for 09/03/2019 Chief Complaint Document Details Patient Name: Stacy Santana, Stacy Santana. Date of Service: 09/03/2019 8:45 AM Medical Record Number: [id number] Patient Account Number: [id number] Date of Birth/Sex: Sep 30, 1951 (68 y.o. F) Treating RN: Army Melia Primary Care Provider: Deborra Medina Other Clinician: Referring Provider: Deborra Medina Treating Provider/Extender: Melburn Hake, HOYT Weeks in Treatment: 2 Information Obtained from: Patient Chief Complaint Left buttock ulcer and abscess Electronic Signature(s) Signed: 09/03/2019 9:10:59 AM By: Worthy Keeler PA-C Entered By: Worthy Keeler on 09/03/2019 09:10:59 Stacy Santana ([id number]) -------------------------------------------------------------------------------- Debridement Details Patient Name: Stacy Santana. Date of Service: 09/03/2019 8:45 AM Medical Record Number: [id number] Patient Account Number: [id number] Date of Birth/Sex: July 20, 1951 (68 y.o. F) Treating RN: Army Melia Primary Care Provider: Deborra Medina Other Clinician: Referring Provider: Deborra Medina Treating Provider/Extender: Melburn Hake, HOYT Weeks in Treatment: 2 Debridement Performed for Wound #1 Left Gluteus Assessment: Performed By: Physician STONE III, HOYT E., PA-C Debridement Type: Debridement Level of Consciousness (Pre- Awake and Alert procedure): Pre-procedure Verification/Time Yes - 09:15 Out Taken: Start Time: 09:16 Pain Control: Lidocaine Total Area Debrided (L x W): 0.9 (cm) x 1.6 (cm) = 1.44 (cm) Tissue and other material Viable, Non-Viable, Subcutaneous, Biofilm debrided: Level: Skin/Subcutaneous Tissue Debridement Description: Excisional Instrument: Curette Bleeding: Minimum Hemostasis Achieved: Pressure End Time: 09:17 Response to Treatment: Procedure was tolerated well Level of Consciousness Awake and Alert (Post-procedure): Post Debridement Measurements of Total Wound Length: (cm)  0.9 Width: (cm) 1.6 Depth: (cm) 0.2 Volume: (cm) 0.226 Character of Wound/Ulcer Post Debridement: Stable Post Procedure Diagnosis Same as Pre-procedure Electronic Signature(s) Signed: 09/03/2019 12:37:56 PM By: Army Melia Signed: 09/03/2019 5:16:54 PM By: Worthy Keeler PA-C Entered By: Army Melia on 09/03/2019 09:17:33 Stacy Santana ([id number]) -------------------------------------------------------------------------------- HPI Details Patient Name: Stacy Santana. Date of Service: 09/03/2019 8:45 AM Medical Record Number: [id number] Patient Account Number: [id number] Date of Birth/Sex: 1951-03-20 (68 y.o. F) Treating RN: Army Melia Primary Care Provider: Deborra Medina Other Clinician: Referring Provider: Deborra Medina Treating Provider/Extender: Melburn Hake, HOYT Weeks in Treatment: 2 History of Present Illness HPI Description: 08/14/2019 on evaluation today patient presents for initial evaluation here in our clinic secondary to a wound that she has in the left gluteal region. She states that this started as an abscess. She was originally concerned about the induration and pain that she experienced when she was actually on a trip for her [place] to the [place]. She states that this subsequently ended up opening up in the night right on the day that they plan to come home the next morning. She had a lot of bleeding and it even got on the sheets because it drained so much. Since that time she has been trying to manage this she actually did get a prescription for Santyl prescribed for her but unfortunately it took some time for her to get it she has just been using it for a few days. Subsequently she does feel like that is helped a little bit but she still has a lot of necrotic tissue she feels like needs to be removed off of the surface of the wound in order to allow this to heal. The patient is actually quite healthy for the most part although she did unfortunately have a  somewhat rare form of lung cancer which ended with her having the left lung removed in its entirety. Unfortunately a year ago they also noted that she had another small lump in the lung on the right although  they have been treating this and apparently it seems to be stable over the past year she tells me. With that being said that still something of a concern for her at this point. No fevers, chills, nausea, vomiting, or diarrhea. She tells me that around the time that she began the treatment for her lung cancer she noted that her skin was more prone to abscesses and infection and she has had a lot of issues as such. She has used Hibiclens once a week most of the time in order to try to cut back on this per her primary care provider's recommendation. 08/21/2019 on evaluation today patient appears to be doing quite well with regard to her wound as compared to last evaluation. She has been tolerating the dressing changes without complication. Fortunately there is no signs of active infection at this time. No fevers, chills, nausea, vomiting, or diarrhea. She has a little bit of hyper granular tissue noted which is likely secondary to the fact that to be honest the [employer] is providing a little bit too much moisture at this time. 09/03/2019 on evaluation today patient appears to be doing quite well with regard to her wound. There is minimal slough and biofilm noted on the surface of the wound today which is going require some sharp debridement to clear that away. With that being said she overall seems to be doing quite well I think the [facility] is an excellent option for her. Her wound is measuring significantly smaller. Electronic Signature(s) Signed: 09/14/2019 10:18:39 AM By: Worthy Keeler PA-C Previous Signature: 09/03/2019 9:20:39 AM Version By: Worthy Keeler PA-C Entered By: Worthy Keeler on 09/14/2019 10:18:39 Stacy Santana  ([id number]) -------------------------------------------------------------------------------- Physical Exam Details Patient Name: Stacy Santana. Date of Service: 09/03/2019 8:45 AM Medical Record Number: [id number] Patient Account Number: [id number] Date of Birth/Sex: 1950-12-25 (68 y.o. F) Treating RN: Army Melia Primary Care Provider: Deborra Medina Other Clinician: Referring Provider: Deborra Medina Treating Provider/Extender: Melburn Hake, HOYT Weeks in Treatment: 2 Constitutional Well-nourished and well-hydrated in no acute distress. Respiratory normal breathing without difficulty. Psychiatric this patient is able to make decisions and demonstrates good insight into disease process. Alert and Oriented x 3. pleasant and cooperative. Notes Patient's wound bed again currently did have some necrotic tissue noted on the surface of the wound. There was mainly biofilm and I was able to remove both of these without complication. Post debridement the wound bed appears to be doing much better which is great news overall very pleased at this point. Electronic Signature(s) Signed: 09/03/2019 9:21:09 AM By: Worthy Keeler PA-C Entered By: Worthy Keeler on 09/03/2019 09:21:09 Stacy Santana ([id number]) -------------------------------------------------------------------------------- Physician Orders Details Patient Name: Stacy Santana. Date of Service: 09/03/2019 8:45 AM Medical Record Number: [id number] Patient Account Number: [id number] Date of Birth/Sex: 07/06/1951 (68 y.o. F) Treating RN: Army Melia Primary Care Provider: Deborra Medina Other Clinician: Referring Provider: Deborra Medina Treating Provider/Extender: Melburn Hake, HOYT Weeks in Treatment: 2 Verbal / Phone Orders: No Diagnosis Coding ICD-10 Coding Code Description L02.31 Cutaneous abscess of buttock L98.412 Non-pressure chronic ulcer of buttock with fat layer exposed Z85.118 Personal history of other malignant neoplasm  of bronchus and lung Wound Cleansing Wound #1 Left Gluteus o Clean wound with Normal Saline. o Dial antibacterial soap, wash wounds, rinse and pat dry prior to dressing wounds - or Hibiclense o May Shower, gently pat wound dry prior to applying new dressing. Anesthetic (add to Medication List) Wound #1 Left  Gluteus o Topical Lidocaine 4% cream applied to wound bed prior to debridement (In Clinic Only). Primary Wound Dressing Wound #1 Left Gluteus o Hydrafera Blue Ready Transfer Secondary Dressing Wound #1 Left Gluteus o Boardered Foam Dressing Dressing Change Frequency Wound #1 Left Gluteus o Change dressing every other day. Follow-up Appointments Wound #1 Left Gluteus o Return Appointment in 2 weeks. Electronic Signature(s) Signed: 09/03/2019 12:37:56 PM By: Army Melia Signed: 09/03/2019 5:16:54 PM By: Worthy Keeler PA-C Entered By: Army Melia on 09/03/2019 09:17:53 Stacy Santana ([id number]) -------------------------------------------------------------------------------- Problem List Details Patient Name: Stacy Santana. Date of Service: 09/03/2019 8:45 AM Medical Record Number: [id number] Patient Account Number: [id number] Date of Birth/Sex: 10/11/1950 (68 y.o. F) Treating RN: Army Melia Primary Care Provider: Deborra Medina Other Clinician: Referring Provider: Deborra Medina Treating Provider/Extender: Melburn Hake, HOYT Weeks in Treatment: 2 Active Problems ICD-10 Evaluated Encounter Code Description Active Date Today Diagnosis L02.31 Cutaneous abscess of buttock 08/14/2019 No Yes L98.412 Non-pressure chronic ulcer of buttock with fat layer exposed 08/14/2019 No Yes Z85.118 Personal history of other malignant neoplasm of bronchus 08/14/2019 No Yes and lung Inactive Problems Resolved Problems Electronic Signature(s) Signed: 09/03/2019 9:10:54 AM By: Worthy Keeler PA-C Entered By: Worthy Keeler on 09/03/2019 09:10:54 Stacy Santana  ([id number]) -------------------------------------------------------------------------------- Progress Note Details Patient Name: Stacy Santana. Date of Service: 09/03/2019 8:45 AM Medical Record Number: [id number] Patient Account Number: [id number] Date of Birth/Sex: [birth year] (68 y.o. F) Treating RN: Army Melia Primary Care Provider: Deborra Medina Other Clinician: Referring Provider: Deborra Medina Treating Provider/Extender: Melburn Hake, HOYT Weeks in Treatment: 2 Subjective Chief Complaint Information obtained from Patient Left buttock ulcer and abscess History of Present Illness (HPI) 08/14/2019 on evaluation today patient presents for initial evaluation here in our clinic secondary to a wound that she has in the left gluteal region. She states that this started as an abscess. She was originally concerned about the induration and pain that she experienced when she was actually on a trip for her anniversary to the mountains. She states that this subsequently ended up opening up in the night right on the day that they plan to come home the next morning. She had a lot of bleeding and it even got on the sheets because it drained so much. Since that time she has been trying to manage this she actually did get a prescription for Santyl prescribed for her but unfortunately it took some time for her to get it she has just been using it for a few days. Subsequently she does feel like that is helped a little bit but she still has a lot of necrotic tissue she feels like needs to be removed off of the surface of the wound in order to allow this to heal. 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 Unfortunately a year ago they also noted that she had another small lump in the lung on the right although they have been treating this and apparently it  seems to be stable over the past year she tells me. With that being said that still something of a concern for her at this point. No fevers, chills, nausea, vomiting, or diarrhea. She tells me that around the time that she began the treatment for her lung cancer she noted that her skin was more prone to abscesses and infection and  she has had a lot of issues as such. She has used Hibiclens once a week most of the time in order to try to cut back on this per her primary care provider's recommendation. 08/21/2019 on evaluation today patient appears to be doing quite well with regard to her wound as compared to last evaluation. She has been tolerating the dressing changes without complication. Fortunately there is no signs of active infection at this time. No fevers, chills, nausea, vomiting, or diarrhea. She has a little bit of hyper granular tissue noted which is likely secondary to the fact that to be honest the [employer] is providing a little bit too much moisture at this time. 09/03/2019 on evaluation today patient appears to be doing quite well with regard to her wound. There is minimal slough and biofilm noted on the surface of the wound today which is going require some sharp debridement to clear that away. With that being said she overall seems to be doing quite well I think the [facility] is an excellent option for her. Her wound is measuring significantly smaller. Patient History Information obtained from Patient. Family History Cancer - Father, Hypertension - Mother,Maternal Grandparents, Lung Disease - Mother, Stroke - Mother, No family history of Diabetes, Heart Disease, Kidney Disease, Seizures, Thyroid Problems, Tuberculosis. Social History Never smoker, Marital Status - Married, Alcohol Use - Rarely, Drug Use - No History, Caffeine Use - Daily. Medical History Eyes Denies history of Cataracts, Glaucoma, Optic Neuritis Ear/Nose/Mouth/Throat Stacy Santana, Stacy Santana ([id number]) Denies  history of Chronic sinus problems/congestion, Middle ear problems Hematologic/Lymphatic Denies history of Anemia, Hemophilia, Human Immunodeficiency Virus, Lymphedema, Sickle Cell Disease Respiratory Denies history of Aspiration, Asthma, Chronic Obstructive Pulmonary Disease (COPD), Pneumothorax, Sleep Apnea, Tuberculosis Cardiovascular Denies history of Angina, Arrhythmia, Congestive Heart Failure, Coronary Artery Disease, Deep Vein Thrombosis, Hypertension, Hypotension, Myocardial Infarction, Peripheral Arterial Disease, Peripheral Venous Disease, Phlebitis, Vasculitis Gastrointestinal Denies history of Cirrhosis , Colitis, Crohn s, Hepatitis A, Hepatitis B, Hepatitis C Endocrine Denies history of Type I Diabetes, Type II Diabetes Genitourinary Denies history of End Stage Renal Disease Immunological Denies history of Lupus Erythematosus, Raynaud s, Scleroderma Integumentary (Skin) Denies history of History of Burn, History of pressure wounds Musculoskeletal Denies history of Gout, Rheumatoid Arthritis, Osteoarthritis, Osteomyelitis Neurologic Denies history of Dementia, Neuropathy, Quadriplegia, Paraplegia, Seizure Disorder Oncologic Patient has history of Received Chemotherapy, Received Radiation Psychiatric Denies history of Anorexia/bulimia, Confinement Anxiety Medical And Surgical History Notes Oncologic 30 treatments Review of Systems (ROS) Constitutional Symptoms (General Health) Denies complaints or symptoms of Fatigue, Fever, Chills, Marked Weight Change. Respiratory Denies complaints or symptoms of Chronic or frequent coughs, Shortness of Breath. Cardiovascular Denies complaints or symptoms of Chest pain, LE edema. Psychiatric Denies complaints or symptoms of Anxiety, Claustrophobia. Objective Constitutional Well-nourished and well-hydrated in no acute distress. Vitals Time Taken: 9:02 AM, Height: 61 in, Weight: 148 lbs, BMI: 28, Temperature: 98.4 F, Pulse: 90  bpm, Respiratory Rate: 16 breaths/min, Blood Pressure: 136/74 mmHg. [place], Steve M. ([id number]) Respiratory normal breathing without difficulty. Psychiatric this patient is able to make decisions and demonstrates good insight into disease process. Alert and Oriented x 3. pleasant and cooperative. General Notes: Patient's wound bed again currently did have some necrotic tissue noted on the surface of the wound. There was mainly biofilm and I was able to remove both of these without complication. Post debridement the wound bed appears to be doing much better which is great news overall very pleased at this point. Integumentary (Hair, Skin) Wound #1 status  is Open. Original cause of wound was Gradually Appeared. The wound is located on the Left Gluteus. The wound measures 0.9cm length x 1.6cm width x 0.1cm depth; 1.131cm^2 area and 0.113cm^3 volume. There is Fat Layer (Subcutaneous Tissue) Exposed exposed. There is no tunneling or undermining noted. There is a medium amount of serosanguineous drainage noted. The wound margin is flat and intact. There is large (67-100%) red granulation within the wound bed. There is no necrotic tissue within the wound bed. Assessment Active Problems ICD-10 Cutaneous abscess of buttock Non-pressure chronic ulcer of buttock with fat layer exposed Personal history of other malignant neoplasm of bronchus and lung Procedures Wound #1 Pre-procedure diagnosis of Wound #1 is an Abscess located on the Left Gluteus . There was a Excisional Skin/Subcutaneous Tissue Debridement with a total area of 1.44 sq cm performed by STONE III, HOYT E., PA-C. With the following instrument (s): Curette to remove Viable and Non-Viable tissue/material. Material removed includes Subcutaneous Tissue and Biofilm and after achieving pain control using Lidocaine. A time out was conducted at 09:15, prior to the start of the procedure. A Minimum amount of bleeding was controlled with  Pressure. The procedure was tolerated well. Post Debridement Measurements: 0.9cm length x 1.6cm width x 0.2cm depth; 0.226cm^3 volume. Character of Wound/Ulcer Post Debridement is stable. Post procedure Diagnosis Wound #1: Same as Pre-Procedure Plan Wound Cleansing: Wound #1 Left Gluteus: Stacy Santana, Stacy Santana. ([id number]) Clean wound with Normal Saline. Dial antibacterial soap, wash wounds, rinse and pat dry prior to dressing wounds - or Hibiclense May Shower, gently pat wound dry prior to applying new dressing. Anesthetic (add to Medication List): Wound #1 Left Gluteus: Topical Lidocaine 4% cream applied to wound bed prior to debridement (In Clinic Only). Primary Wound Dressing: Wound #1 Left Gluteus: Hydrafera Blue Ready Transfer Secondary Dressing: Wound #1 Left Gluteus: Boardered Foam Dressing Dressing Change Frequency: Wound #1 Left Gluteus: Change dressing every other day. Follow-up Appointments: Wound #1 Left Gluteus: Return Appointment in 2 weeks. 1. My suggestion at this time is good to be that we go ahead and continue with the [facility] as that seems to be doing very well for her. 2. I am in a suggest as well that the patient continue to ensure there is no pressure getting to the area there does not seem to be and I think she is doing excellent in this regard. 3. We will plan to reevaluate in a couple weeks to see where things stand at that point. We will see patient back for reevaluation in 2 weeks here in the clinic. If anything worsens or changes patient will contact our office for additional recommendations. Electronic Signature(s) Signed: 09/14/2019 10:18:52 AM By: Worthy Keeler PA-C Previous Signature: 09/03/2019 9:21:30 AM Version By: Worthy Keeler PA-C Entered By: Worthy Keeler on 09/14/2019 10:18:52 Stacy Santana ([id number]) -------------------------------------------------------------------------------- ROS/PFSH Details Patient Name: Stacy Santana. Date of Service: 09/03/2019 8:45 AM Medical Record Number: [id number] Patient Account Number: [id number] Date of Birth/Sex: 12/03/1950 (67 y.o. F) Treating RN: Army Melia Primary Care Provider: Deborra Medina Other Clinician: Referring Provider: Deborra Medina Treating Provider/Extender: Melburn Hake, HOYT Weeks in Treatment: 2 Information Obtained From Patient Constitutional Symptoms (General Health) Complaints and Symptoms: Negative for: Fatigue; Fever; Chills; Marked Weight Change Respiratory Complaints and Symptoms: Negative for: Chronic or frequent coughs; Shortness of Breath Medical History: Negative for: Aspiration; Asthma; Chronic Obstructive Pulmonary Disease (COPD); Pneumothorax; Sleep Apnea; Tuberculosis Cardiovascular Complaints and Symptoms: Negative for: Chest pain; LE edema Medical  History: Negative for: Angina; Arrhythmia; Congestive Heart Failure; Coronary Artery Disease; Deep Vein Thrombosis; Hypertension; Hypotension; Myocardial Infarction; Peripheral Arterial Disease; Peripheral Venous Disease; Phlebitis; Vasculitis Psychiatric Complaints and Symptoms: Negative for: Anxiety; Claustrophobia Medical History: Negative for: Anorexia/bulimia; Confinement Anxiety Eyes Medical History: Negative for: Cataracts; Glaucoma; Optic Neuritis Ear/Nose/Mouth/Throat Medical History: Negative for: Chronic sinus problems/congestion; Middle ear problems Hematologic/Lymphatic Medical History: Negative for: Anemia; Hemophilia; Human Immunodeficiency Virus; Lymphedema; Sickle Cell Disease Stacy Santana, Stacy Santana. ([id number]) Gastrointestinal Medical History: Negative for: Cirrhosis ; Colitis; Crohnos; Hepatitis A; Hepatitis B; Hepatitis C Endocrine Medical History: Negative for: Type I Diabetes; Type II Diabetes Genitourinary Medical History: Negative for: End Stage Renal Disease Immunological Medical History: Negative for: Lupus Erythematosus; Raynaudos;  Scleroderma Integumentary (Skin) Medical History: Negative for: History of Burn; History of pressure wounds Musculoskeletal Medical History: Negative for: Gout; Rheumatoid Arthritis; Osteoarthritis; Osteomyelitis Neurologic Medical History: Negative for: Dementia; Neuropathy; Quadriplegia; Paraplegia; Seizure Disorder Oncologic Medical History: Positive for: Received Chemotherapy; Received Radiation Past Medical History Notes: 30 treatments Immunizations Pneumococcal Vaccine: Received Pneumococcal Vaccination: Yes Implantable Devices None Family and Social History Cancer: Yes - Father; Diabetes: No; Heart Disease: No; Hypertension: Yes - Mother,Maternal Grandparents; Kidney Disease: No; Lung Disease: Yes - Mother; Seizures: No; Stroke: Yes - Mother; Thyroid Problems: No; Tuberculosis: No; Never smoker; Marital Status - Married; Alcohol Use: Rarely; Drug Use: No History; Caffeine Use: Daily; Financial Concerns: No; Food, Clothing or Shelter Needs: No; Support System Lacking: No; Transportation Concerns: No Physician Affirmation I have reviewed and agree with the above information. Electronic Signature(s) Stacy Santana, Stacy Santana [place]. ([id number]) Signed: 09/03/2019 12:37:56 PM By: Army Melia Signed: 09/03/2019 5:16:54 PM By: Worthy Keeler PA-C Entered By: Worthy Keeler on 09/03/2019 09:20:56 Stacy Santana, Stacy Santana ([id number]) -------------------------------------------------------------------------------- SuperBill Details Patient Name: Stacy Santana. Date of Service: 09/03/2019 Medical Record Number: [id number] Patient Account Number: [id number] Date of Birth/Sex: 1950/12/18 (68 y.o. F) Treating RN: Army Melia Primary Care Provider: Deborra Medina Other Clinician: Referring Provider: Deborra Medina Treating Provider/Extender: Melburn Hake, HOYT Weeks in Treatment: 2 Diagnosis Coding ICD-10 Codes Code Description L02.31 Cutaneous abscess of buttock L98.412 Non-pressure chronic ulcer of  buttock with fat layer exposed Z85.118 Personal history of other malignant neoplasm of bronchus and lung Facility Procedures CPT4 Code: [id number] Description: 32919 - DEB SUBQ TISSUE 20 SQ CM/< ICD-10 Diagnosis Description L98.412 Non-pressure chronic ulcer of buttock with fat layer expo Modifier: sed Quantity: 1 Physician Procedures CPT4 Code: 1660600 Description: 45997 - WC PHYS SUBQ TISS 20 SQ CM ICD-10 Diagnosis Description L98.412 Non-pressure chronic ulcer of buttock with fat layer expo Modifier: sed Quantity: 1 Electronic Signature(s) Signed: 09/03/2019 9:21:37 AM By: Worthy Keeler PA-C Entered By: Worthy Keeler on 09/03/2019 09:21:37

## 2019-09-03 NOTE — Progress Notes (Signed)
ANAYLA, GIANNETTI ([id number]) Visit Report for 09/03/2019 Arrival Information Details Patient Name: Stacy Santana, Stacy Santana. Date of Service: 09/03/2019 8:45 AM Medical Record Number: [id number] Patient Account Number: [id number] Date of Birth/Sex: [birth year] (68 y.o. F) Treating RN: Montey Hora Primary Care Yobana Culliton: Deborra Medina Other Clinician: Referring Lamika Connolly: Deborra Medina Treating Kamoni Depree/Extender: Melburn Hake, HOYT Weeks in Treatment: 2 Visit Information History Since Last Visit Added or deleted any medications: No Patient Arrived: Ambulatory Any new allergies or adverse reactions: No Arrival Time: 09:00 Had a fall or experienced change in No Accompanied By: self activities of daily living that may affect Transfer Assistance: None risk of falls: Patient Identification Verified: Yes Signs or symptoms of abuse/neglect since last visito No Secondary Verification Process Completed: Yes Hospitalized since last visit: No Implantable device outside of the clinic excluding No cellular tissue based products placed in the center since last visit: Has Dressing in Place as Prescribed: Yes Pain Present Now: No Electronic Signature(s) Signed: 09/03/2019 5:00:08 PM By: Montey Hora Entered By: Montey Hora on 09/03/2019 09:01:09 [name] ([id number]) -------------------------------------------------------------------------------- Encounter Discharge Information Details Patient Name: [name]. Date of Service: 09/03/2019 8:45 AM Medical Record Number: [id number] Patient Account Number: [id number] Date of Birth/Sex: [birth year] (68 y.o. F) Treating RN: Army Melia Primary Care Stasha Naraine: Deborra Medina Other Clinician: Referring Leone Mobley: Deborra Medina Treating Veasna Santibanez/Extender: Melburn Hake, HOYT Weeks in Treatment: 2 Encounter Discharge Information Items Post Procedure Vitals Discharge Condition: Stable Temperature (F): 98.4 Ambulatory Status: Ambulatory Pulse (bpm):  90 Discharge Destination: Home Respiratory Rate (breaths/min): 16 Transportation: Private Auto Blood Pressure (mmHg): 136/74 Accompanied By: self Schedule Follow-up Appointment: Yes Clinical Summary of Care: Electronic Signature(s) Signed: 09/03/2019 12:37:56 PM By: Army Melia Entered By: Army Melia on 09/03/2019 09:18:53 [name] ([id number]) -------------------------------------------------------------------------------- Lower Extremity Assessment Details Patient Name: [name]. Date of Service: 09/03/2019 8:45 AM Medical Record Number: [id number] Patient Account Number: [id number] Date of Birth/Sex: 1951-05-23 (68 y.o. F) Treating RN: Montey Hora Primary Care Missie Gehrig: Deborra Medina Other Clinician: Referring Antonette Hendricks: Deborra Medina Treating Ketih Goodie/Extender: Melburn Hake, HOYT Weeks in Treatment: 2 Electronic Signature(s) Signed: 09/03/2019 5:00:08 PM By: Montey Hora Entered By: Montey Hora on 09/03/2019 09:03:56 [name] ([id number]) -------------------------------------------------------------------------------- Multi Wound Chart Details Patient Name: [name]. Date of Service: 09/03/2019 8:45 AM Medical Record Number: [id number] Patient Account Number: [id number] Date of Birth/Sex: 05-04-1951 (68 y.o. F) Treating RN: Army Melia Primary Care Xin Klawitter: Deborra Medina Other Clinician: Referring Lenzy Kerschner: Deborra Medina Treating Camera Krienke/Extender: Melburn Hake, HOYT Weeks in Treatment: 2 Vital Signs Height(in): 61 Pulse(bpm): 90 Weight(lbs): 148 Blood Pressure(mmHg): 136/74 Body Mass Index(BMI): 28 Temperature(F): 98.4 Respiratory Rate 16 (breaths/min): Photos: [N/A:N/A] Wound Location: Left Gluteus N/A N/A Wounding Event: Gradually Appeared N/A N/A Primary Etiology: Abscess N/A N/A Comorbid History: Received Chemotherapy, N/A N/A Received Radiation Date Acquired: 07/23/2019 N/A N/A Weeks of Treatment: 2 N/A N/A Wound Status:  Open N/A N/A Measurements L x W x D 0.9x1.6x0.1 N/A N/A (cm) Area (cm) : 1.131 N/A N/A Volume (cm) : 0.113 N/A N/A % Reduction in Area: 83.10% N/A N/A % Reduction in Volume: 96.60% N/A N/A Classification: Full Thickness Without N/A N/A Exposed Support Structures Exudate Amount: Medium N/A N/A Exudate Type: Serosanguineous N/A N/A Exudate Color: red, brown N/A N/A Wound Margin: Flat and Intact N/A N/A Granulation Amount: Large (67-100%) N/A N/A Granulation Quality: Red N/A N/A Necrotic Amount: None Present (0%) N/A N/A Exposed Structures: Fat Layer (Subcutaneous N/A N/A Tissue) Exposed: Yes Fascia: No Tendon: No Muscle: No  SHARLENA, KRISTENSEN Brinkley. ([id number]) Joint: No Bone: No Epithelialization: Small (1-33%) N/A N/A Treatment Notes Electronic Signature(s) Signed: 09/03/2019 12:37:56 PM By: Army Melia Entered By: Army Melia on 09/03/2019 09:11:58 [name] ([id number]) -------------------------------------------------------------------------------- [place] Details Patient Name: [name]. Date of Service: 09/03/2019 8:45 AM Medical Record Number: [id number] Patient Account Number: [id number] Date of Birth/Sex: 02/16/1951 (68 y.o. F) Treating RN: Army Melia Primary Care Rykar Lebleu: Deborra Medina Other Clinician: Referring Yon Schiffman: Deborra Medina Treating Dani Danis/Extender: Melburn Hake, HOYT Weeks in Treatment: 2 Active Inactive Abuse / Safety / Falls / Self Care Management Nursing Diagnoses: Potential for falls Goals: Patient will remain injury free related to falls Date Initiated: 08/14/2019 Target Resolution Date: 11/07/2019 Goal Status: Active Interventions: Assess fall risk on admission and as needed Notes: Orientation to the Wound Care Program Nursing Diagnoses: Knowledge deficit related to the wound healing center program Goals: Patient/caregiver will verbalize understanding of the [place] Program Date Initiated:  08/14/2019 Target Resolution Date: 11/07/2019 Goal Status: Active Interventions: Provide education on orientation to the wound center Notes: Pressure Nursing Diagnoses: Potential for impaired tissue integrity related to pressure, friction, moisture, and shear Goals: Patient will remain free from development of additional pressure ulcers Date Initiated: 08/14/2019 Target Resolution Date: 11/07/2019 Goal Status: Active Interventions: Assess potential for pressure ulcer upon admission and as needed CHELAN, HERINGER. ([id number]) Notes: Wound/Skin Impairment Nursing Diagnoses: Impaired tissue integrity Goals: Ulcer/skin breakdown will heal within 14 weeks Date Initiated: 08/14/2019 Target Resolution Date: 11/07/2019 Goal Status: Active Interventions: Assess patient/caregiver ability to obtain necessary supplies Assess patient/caregiver ability to perform ulcer/skin care regimen upon admission and as needed Assess ulceration(s) every visit Notes: Electronic Signature(s) Signed: 09/03/2019 12:37:56 PM By: Army Melia Entered By: Army Melia on 09/03/2019 09:11:50 [name] ([id number]) -------------------------------------------------------------------------------- Pain Assessment Details Patient Name: [name]. Date of Service: 09/03/2019 8:45 AM Medical Record Number: [id number] Patient Account Number: [id number] Date of Birth/Sex: 12/31/1950 (68 y.o. F) Treating RN: Montey Hora Primary Care Telisha Zawadzki: Deborra Medina Other Clinician: Referring Jamesen Stahnke: Deborra Medina Treating Naleyah Ohlinger/Extender: Melburn Hake, HOYT Weeks in Treatment: 2 Active Problems Location of Pain Severity and Description of Pain Patient Has Paino No Site Locations Pain Management and Medication Current Pain Management: Electronic Signature(s) Signed: 09/03/2019 5:00:08 PM By: Montey Hora Entered By: Montey Hora on 09/03/2019 09:02:00 [name]  ([id number]) -------------------------------------------------------------------------------- Patient/Caregiver Education Details Patient Name: [name]. Date of Service: 09/03/2019 8:45 AM Medical Record Number: [id number] Patient Account Number: [id number] Date of Birth/Gender: 1951-02-17 (68 y.o. F) Treating RN: Army Melia Primary Care Physician: Deborra Medina Other Clinician: Referring Physician: Deborra Medina Treating Physician/Extender: Sharalyn Ink in Treatment: 2 Education Assessment Education Provided To: Patient Education Topics Provided Wound/Skin Impairment: Handouts: Caring for Your Ulcer Methods: Demonstration, Explain/Verbal Responses: State content correctly Electronic Signature(s) Signed: 09/03/2019 12:37:56 PM By: Army Melia Entered By: Army Melia on 09/03/2019 09:18:10 [name] ([id number]) -------------------------------------------------------------------------------- Wound Assessment Details Patient Name: [name]. Date of Service: 09/03/2019 8:45 AM Medical Record Number: [id number] Patient Account Number: [id number] Date of Birth/Sex: 1950-12-11 (68 y.o. F) Treating RN: Montey Hora Primary Care Latica Hohmann: Deborra Medina Other Clinician: Referring Arville Postlewaite: Deborra Medina Treating Azalee Weimer/Extender: Melburn Hake, HOYT Weeks in Treatment: 2 Wound Status Wound Number: 1 Primary Etiology: Abscess Wound Location: Left Gluteus Wound Status: Open Wounding Event: Gradually Appeared Comorbid Received Chemotherapy, Received History: Radiation Date Acquired: 07/23/2019 Weeks Of Treatment: 2 Clustered Wound: No Photos Wound Measurements Length: (cm) 0.9 Width: (cm) 1.6  Depth: (cm) 0.1 Area: (cm) 1.131 Volume: (cm) 0.113 % Reduction in Area: 83.1% % Reduction in Volume: 96.6% Epithelialization: Small (1-33%) Tunneling: No Undermining: No Wound Description Full Thickness Without Exposed Support Foul  Odo Classification: Structures Slough/F Wound Margin: Flat and Intact Exudate Medium Amount: Exudate Type: Serosanguineous Exudate Color: red, brown r After Cleansing: No ibrino No Wound Bed Granulation Amount: Large (67-100%) Exposed Structure Granulation Quality: Red Fascia Exposed: No Necrotic Amount: None Present (0%) Fat Layer (Subcutaneous Tissue) Exposed: Yes Tendon Exposed: No Muscle Exposed: No Joint Exposed: No Bone Exposed: No Aaberg, Angelika M. ([id number]) Treatment Notes Wound #1 (Left Gluteus) Notes hydrofera blue, BFD Electronic Signature(s) Signed: 09/03/2019 5:00:08 PM By: Montey Hora Entered By: Montey Hora on 09/03/2019 09:07:51 [name] ([id number]) -------------------------------------------------------------------------------- Vitals Details Patient Name: [name]. Date of Service: 09/03/2019 8:45 AM Medical Record Number: [id number] Patient Account Number: [id number] Date of Birth/Sex: 10/10/1950 (68 y.o. F) Treating RN: Montey Hora Primary Care Justan Gaede: Deborra Medina Other Clinician: Referring Cleon Signorelli: Deborra Medina Treating Lindzey Zent/Extender: Melburn Hake, HOYT Weeks in Treatment: 2 Vital Signs Time Taken: 09:02 Temperature (F): 98.4 Height (in): 61 Pulse (bpm): 90 Weight (lbs): 148 Respiratory Rate (breaths/min): 16 Body Mass Index (BMI): 28 Blood Pressure (mmHg): 136/74 Reference Range: 80 - 120 mg / dl Electronic Signature(s) Signed: 09/03/2019 5:00:08 PM By: Montey Hora Entered By: Montey Hora on 09/03/2019 09:03:00

## 2019-09-08 ENCOUNTER — Institutional Professional Consult (permissible substitution): Admit: 2019-09-08 | Discharge: 2019-09-09 | Payer: MEDICARE

## 2019-09-08 DIAGNOSIS — R197 Diarrhea, unspecified: Secondary | ICD-10-CM | POA: Diagnosis not present

## 2019-09-08 DIAGNOSIS — K611 Rectal abscess: Secondary | ICD-10-CM | POA: Diagnosis not present

## 2019-09-08 DIAGNOSIS — T364X5A Adverse effect of tetracyclines, initial encounter: Secondary | ICD-10-CM | POA: Diagnosis not present

## 2019-09-08 DIAGNOSIS — C3490 Malignant neoplasm of unspecified part of unspecified bronchus or lung: Secondary | ICD-10-CM | POA: Diagnosis not present

## 2019-09-08 DIAGNOSIS — Z515 Encounter for palliative care: Secondary | ICD-10-CM | POA: Diagnosis not present

## 2019-09-08 DIAGNOSIS — R110 Nausea: Secondary | ICD-10-CM | POA: Diagnosis not present

## 2019-09-16 MED ORDER — LOPERAMIDE 2 MG CAPSULE
ORAL_CAPSULE | 0 refills | 0 days | Status: CP
Start: 2019-09-16 — End: ?

## 2019-09-17 ENCOUNTER — Other Ambulatory Visit: Payer: Self-pay

## 2019-09-17 ENCOUNTER — Encounter: Payer: Medicare Other | Admitting: Physician Assistant

## 2019-09-17 DIAGNOSIS — L98412 Non-pressure chronic ulcer of buttock with fat layer exposed: Secondary | ICD-10-CM | POA: Diagnosis not present

## 2019-09-17 NOTE — Progress Notes (Signed)
Stacy, Santana ([id number]) Visit Report for 09/17/2019 Arrival Information Details Patient Name: Stacy Santana, Stacy Santana. Date of Service: 09/17/2019 9:00 AM Medical Record Number: [id number] Patient Account Number: [id number] Date of Birth/Sex: [birth year] (68 y.o. F) Treating RN: Harold Barban Primary Care Loys Shugars: Deborra Medina Other Clinician: Referring Adyan Palau: Deborra Medina Treating Janara Klett/Extender: Melburn Hake, HOYT Weeks in Treatment: 4 Visit Information History Since Last Visit Added or deleted any medications: No Patient Arrived: Ambulatory Any new allergies or adverse reactions: No Arrival Time: 08:59 Had a fall or experienced change in No Accompanied By: self activities of daily living that may affect Transfer Assistance: None risk of falls: Patient Identification Verified: Yes Signs or symptoms of abuse/neglect since last visito No Secondary Verification Process Completed: Yes Hospitalized since last visit: No Has Dressing in Place as Prescribed: Yes Pain Present Now: No Electronic Signature(s) Signed: 09/17/2019 4:33:53 PM By: Harold Barban Entered By: Harold Barban on 09/17/2019 08:59:20 Stacy Santana ([id number]) -------------------------------------------------------------------------------- Clinic Level of Care Assessment Details Patient Name: Stacy Santana. Date of Service: 09/17/2019 9:00 AM Medical Record Number: [id number] Patient Account Number: [id number] Date of Birth/Sex: 08-Sep-1951 (68 y.o. F) Treating RN: Army Melia Primary Care Lak[place] Schweppe: Deborra Medina Other Clinician: Referring Jinx Gilden: Deborra Medina Treating Melynda Krzywicki/Extender: Melburn Hake, HOYT Weeks in Treatment: 4 Clinic Level of Care Assessment Items TOOL 4 Quantity Score []  - Use when only an EandM is performed on FOLLOW-UP visit 0 ASSESSMENTS - Nursing Assessment / Reassessment X - Reassessment of Co-morbidities (includes updates in patient status) 1 10 X- 1 5 Reassessment of  Adherence to Treatment Plan ASSESSMENTS - Wound and Skin Assessment / Reassessment X - Simple Wound Assessment / Reassessment - one wound 1 5 []  - 0 Complex Wound Assessment / Reassessment - multiple wounds []  - 0 Dermatologic / Skin Assessment (not related to wound area) ASSESSMENTS - Focused Assessment []  - Circumferential Edema Measurements - multi extremities 0 []  - 0 Nutritional Assessment / Counseling / Intervention []  - 0 Lower Extremity Assessment (monofilament, tuning fork, pulses) []  - 0 Peripheral Arterial Disease Assessment (using hand held doppler) ASSESSMENTS - Ostomy and/or Continence Assessment and Care []  - Incontinence Assessment and Management 0 []  - 0 Ostomy Care Assessment and Management (repouching, etc.) PROCESS - Coordination of Care X - Simple Patient / Family Education for ongoing care 1 15 []  - 0 Complex (extensive) Patient / Family Education for ongoing care []  - 0 Staff obtains Programmer, systems, Records, Test Results / Process Orders []  - 0 Staff telephones HHA, Nursing Homes / Clarify orders / etc []  - 0 Routine Transfer to another Facility (non-emergent condition) []  - 0 Routine Hospital Admission (non-emergent condition) []  - 0 New Admissions / Biomedical engineer / Ordering NPWT, Apligraf, etc. []  - 0 Emergency Hospital Admission (emergent condition) X- 1 10 Simple Discharge Coordination JILLANE, PO. ([id number]) []  - 0 Complex (extensive) Discharge Coordination PROCESS - Special Needs []  - Pediatric / Minor Patient Management 0 []  - 0 Isolation Patient Management []  - 0 Hearing / Language / Visual special needs []  - 0 Assessment of Community assistance (transportation, D/C planning, etc.) []  - 0 Additional assistance / Altered mentation []  - 0 Support Surface(s) Assessment (bed, cushion, seat, etc.) INTERVENTIONS - Wound Cleansing / Measurement X - Simple Wound Cleansing - one wound 1 5 []  - 0 Complex Wound Cleansing - multiple  wounds X- 1 5 Wound Imaging (photographs - any number of wounds) []  - 0 Wound Tracing (instead of photographs) X- 1 5 Simple Wound  Measurement - one wound []  - 0 Complex Wound Measurement - multiple wounds INTERVENTIONS - Wound Dressings []  - Small Wound Dressing one or multiple wounds 0 X- 1 15 Medium Wound Dressing one or multiple wounds []  - 0 Large Wound Dressing one or multiple wounds []  - 0 Application of Medications - topical []  - 0 Application of Medications - injection INTERVENTIONS - Miscellaneous []  - External ear exam 0 []  - 0 Specimen Collection (cultures, biopsies, blood, body fluids, etc.) []  - 0 Specimen(s) / Culture(s) sent or taken to Lab for analysis []  - 0 Patient Transfer (multiple staff / Civil Service fast streamer / Similar devices) []  - 0 Simple Staple / Suture removal (25 or less) []  - 0 Complex Staple / Suture removal (26 or more) []  - 0 Hypo / Hyperglycemic Management (close monitor of Blood Glucose) []  - 0 Ankle / Brachial Index (ABI) - do not check if billed separately X- 1 5 Vital Signs Janowiak, Mateya M. ([id number]) Has the patient been seen at the hospital within the last three years: Yes Total Score: 80 Level Of Care: New/Established - Level 3 Electronic Signature(s) Signed: 09/17/2019 11:07:02 AM By: Army Melia Entered By: Army Melia on 09/17/2019 09:23:43 Stacy Santana ([id number]) -------------------------------------------------------------------------------- Encounter Discharge Information Details Patient Name: Stacy Santana. Date of Service: 09/17/2019 9:00 AM Medical Record Number: [id number] Patient Account Number: [id number] Date of Birth/Sex: 20-Apr-1951 (68 y.o. F) Treating RN: Army Melia Primary Care Forever Arechiga: Deborra Medina Other Clinician: Referring Abdurahman Rugg: Deborra Medina Treating Genella Bas/Extender: Melburn Hake, HOYT Weeks in Treatment: 4 Encounter Discharge Information Items Discharge Condition: Stable Ambulatory Status:  Ambulatory Discharge Destination: Home Transportation: Private Auto Accompanied By: self Schedule Follow-up Appointment: Yes Clinical Summary of Care: Electronic Signature(s) Signed: 09/17/2019 11:07:02 AM By: Army Melia Entered By: Army Melia on 09/17/2019 09:24:22 Stacy Santana ([id number]) -------------------------------------------------------------------------------- Lower Extremity Assessment Details Patient Name: Stacy Santana. Date of Service: 09/17/2019 9:00 AM Medical Record Number: [id number] Patient Account Number: [id number] Date of Birth/Sex: [birth year] (68 y.o. F) Treating RN: Harold Barban Primary Care Eldonna Neuenfeldt: Deborra Medina Other Clinician: Referring Kennadi Albany: Deborra Medina Treating Perkins Molina/Extender: Melburn Hake, HOYT Weeks in Treatment: 4 Electronic Signature(s) Signed: 09/17/2019 4:33:53 PM By: Harold Barban Entered By: Harold Barban on 09/17/2019 08:59:40 Stacy Santana ([id number]) -------------------------------------------------------------------------------- Multi Wound Chart Details Patient Name: Stacy Santana. Date of Service: 09/17/2019 9:00 AM Medical Record Number: [id number] Patient Account Number: [id number] Date of Birth/Sex: October 08, 1950 (67 y.o. F) Treating RN: Army Melia Primary Care Sophiah Rolin: Deborra Medina Other Clinician: Referring Asyah Candler: Deborra Medina Treating Swannie Milius/Extender: Melburn Hake, HOYT Weeks in Treatment: 4 Photos: [N/A:N/A] Wound Location: Left Gluteus N/A N/A Wounding Event: Gradually Appeared N/A N/A Primary Etiology: Abscess N/A N/A Comorbid History: Received Chemotherapy, N/A N/A Received Radiation Date Acquired: 07/23/2019 N/A N/A Weeks of Treatment: 4 N/A N/A Wound Status: Open N/A N/A Measurements L x W x D 0.4x0.3x0.1 N/A N/A (cm) Area (cm) : 0.094 N/A N/A Volume (cm) : 0.009 N/A N/A % Reduction in Area: 98.60% N/A N/A % Reduction in Volume: 99.70% N/A N/A Classification: Full Thickness  Without N/A N/A Exposed Support Structures Exudate Amount: Medium N/A N/A Exudate Type: Serosanguineous N/A N/A Exudate Color: red, brown N/A N/A Wound Margin: Flat and Intact N/A N/A Granulation Amount: Large (67-100%) N/A N/A Granulation Quality: Red N/A N/A Necrotic Amount: None Present (0%) N/A N/A Exposed Structures: Fat Layer (Subcutaneous N/A N/A Tissue) Exposed: Yes Fascia: No Tendon: No Muscle: No Joint: No Bone: No Epithelialization: Small (1-33%) N/A N/A Treatment  Notes Electronic Signature(s) Signed: 09/17/2019 11:07:02 AM By: Primitivo Gauze, Lannette Donath ([id number]) Entered By: Army Melia on 09/17/2019 09:18:57 Stacy Santana ([id number]) -------------------------------------------------------------------------------- [place] Details Patient Name: ZHANIA, SHAHEEN. Date of Service: 09/17/2019 9:00 AM Medical Record Number: [id number] Patient Account Number: [id number] Date of Birth/Sex: November 25, 1950 (68 y.o. F) Treating RN: Army Melia Primary Care Krista Godsil: Deborra Medina Other Clinician: Referring Guillermo Difrancesco: Deborra Medina Treating Elzabeth Mcquerry/Extender: Melburn Hake, HOYT Weeks in Treatment: 4 Active Inactive Abuse / Safety / Falls / Self Care Management Nursing Diagnoses: Potential for falls Goals: Patient will remain injury free related to falls Date Initiated: 08/14/2019 Target Resolution Date: 11/07/2019 Goal Status: Active Interventions: Assess fall risk on admission and as needed Notes: Orientation to the Wound Care Program Nursing Diagnoses: Knowledge deficit related to the wound healing center program Goals: Patient/caregiver will verbalize understanding of the [facility] Program Date Initiated: 08/14/2019 Target Resolution Date: 11/07/2019 Goal Status: Active Interventions: Provide education on orientation to the wound center Notes: Pressure Nursing Diagnoses: Potential for impaired tissue integrity related to  pressure, friction, moisture, and shear Goals: Patient will remain free from development of additional pressure ulcers Date Initiated: 08/14/2019 Target Resolution Date: 11/07/2019 Goal Status: Active Interventions: Assess potential for pressure ulcer upon admission and as needed POSIE, LILLIBRIDGE. ([id number]) Notes: Wound/Skin Impairment Nursing Diagnoses: Impaired tissue integrity Goals: Ulcer/skin breakdown will heal within 14 weeks Date Initiated: 08/14/2019 Target Resolution Date: 11/07/2019 Goal Status: Active Interventions: Assess patient/caregiver ability to obtain necessary supplies Assess patient/caregiver ability to perform ulcer/skin care regimen upon admission and as needed Assess ulceration(s) every visit Notes: Electronic Signature(s) Signed: 09/17/2019 11:07:02 AM By: Army Melia Entered By: Army Melia on 09/17/2019 09:18:48 Stacy Santana ([id number]) -------------------------------------------------------------------------------- Pain Assessment Details Patient Name: Stacy Santana. Date of Service: 09/17/2019 9:00 AM Medical Record Number: [id number] Patient Account Number: [id number] Date of Birth/Sex: [birth year] (68 y.o. F) Treating RN: Harold Barban Primary Care Versa Craton: Deborra Medina Other Clinician: Referring Samanthamarie Ezzell: Deborra Medina Treating Jansen Goodpasture/Extender: Melburn Hake, HOYT Weeks in Treatment: 4 Active Problems Location of Pain Severity and Description of Pain Patient Has Paino No Site Locations Pain Management and Medication Current Pain Management: Electronic Signature(s) Signed: 09/17/2019 4:33:53 PM By: Harold Barban Entered By: Harold Barban on 09/17/2019 08:59:26 Stacy Santana ([id number]) -------------------------------------------------------------------------------- Patient/Caregiver Education Details Patient Name: Stacy Santana. Date of Service: 09/17/2019 9:00 AM Medical Record Number: [id number] Patient Account Number:  [id number] Date of Birth/Gender: 1950/10/16 (68 y.o. F) Treating RN: Army Melia Primary Care Physician: Deborra Medina Other Clinician: Referring Physician: Deborra Medina Treating Physician/Extender: Sharalyn Ink in Treatment: 4 Education Assessment Education Provided To: Patient Education Topics Provided Wound/Skin Impairment: Handouts: Caring for Your Ulcer Methods: Demonstration, Explain/Verbal Responses: State content correctly Electronic Signature(s) Signed: 09/17/2019 11:07:02 AM By: Army Melia Entered By: Army Melia on 09/17/2019 09:23:53 Stacy Santana ([id number]) -------------------------------------------------------------------------------- Wound Assessment Details Patient Name: Stacy Santana. Date of Service: 09/17/2019 9:00 AM Medical Record Number: [id number] Patient Account Number: [id number] Date of Birth/Sex: Jun 09, 1951 (68 y.o. F) Treating RN: Harold Barban Primary Care Stephaniemarie Stoffel: Deborra Medina Other Clinician: Referring Mikie Misner: Deborra Medina Treating Stephanieann Popescu/Extender: Melburn Hake, HOYT Weeks in Treatment: 4 Wound Status Wound Number: 1 Primary Etiology: Abscess Wound Location: Left Gluteus Wound Status: Open Wounding Event: Gradually Appeared Comorbid Received Chemotherapy, Received History: Radiation Date Acquired: 07/23/2019 Weeks Of Treatment: 4 Clustered Wound: No Photos Wound Measurements Length: (cm) 0.4 Width: (cm) 0.3 Depth: (cm) 0.1 Area: (cm) 0.094 Volume: (cm) 0.009 %  Reduction in Area: 98.6% % Reduction in Volume: 99.7% Epithelialization: Small (1-33%) Tunneling: No Undermining: No Wound Description Full Thickness Without Exposed Support Foul Od Classification: Structures Slough/ Wound Margin: Flat and Intact Exudate Medium Amount: Exudate Type: Serosanguineous Exudate Color: red, brown or After Cleansing: No Fibrino No Wound Bed Granulation Amount: Large (67-100%) Exposed Structure Granulation Quality:  Red Fascia Exposed: No Necrotic Amount: None Present (0%) Fat Layer (Subcutaneous Tissue) Exposed: Yes Tendon Exposed: No Muscle Exposed: No Joint Exposed: No Bone Exposed: No Isley, Meganne M. ([id number]) Treatment Notes Wound #1 (Left Gluteus) Notes TCA, BFD Electronic Signature(s) Signed: 09/17/2019 4:33:53 PM By: Harold Barban Entered By: Harold Barban on 09/17/2019 09:03:49

## 2019-09-17 NOTE — Progress Notes (Addendum)
KEVIN, MARIO ([id number]) Visit Report for 09/17/2019 Chief Complaint Document Details Patient Name: Stacy, Santana. Date of Service: 09/17/2019 9:00 AM Medical Record Number: [id number] Patient Account Number: [id number] Date of Birth/Sex: [birth year] (68 y.o. F) Treating RN: Army Melia Primary Care Provider: Deborra Medina Other Clinician: Referring Provider: Deborra Medina Treating Provider/Extender: Melburn Hake, HOYT Weeks in Treatment: 4 Information Obtained from: Patient Chief Complaint Left buttock ulcer and abscess Electronic Signature(s) Signed: 09/17/2019 9:06:48 AM By: Worthy Keeler PA-C Entered By: Worthy Keeler on 09/17/2019 09:06:48 Stacy Santana ([id number]) -------------------------------------------------------------------------------- HPI Details Patient Name: Stacy Santana. Date of Service: 09/17/2019 9:00 AM Medical Record Number: [id number] Patient Account Number: [id number] Date of Birth/Sex: 02/28/1951 (68 y.o. F) Treating RN: Army Melia Primary Care Provider: Deborra Medina Other Clinician: Referring Provider: Deborra Medina Treating Provider/Extender: Melburn Hake, HOYT Weeks in Treatment: 4 History of Present Illness HPI Description: 08/14/2019 on evaluation today patient presents for initial evaluation here in our clinic secondary to a wound that she has in the left gluteal region. She states that this started as an abscess. She was originally concerned about the induration and pain that she experienced when she was actually on a trip [place]. She states that this subsequently ended up opening up in the night right on the day that they plan to come home the next morning. She had a lot of bleeding and it even got on the sheets because it drained so much. Since that time she has been trying to manage this she actually did get a prescription for Santyl prescribed for her but unfortunately it took some time for her to get  it she has just been using it for a few days. Subsequently she does feel like that is helped a little bit but she still has a lot of necrotic tissue she feels like needs to be removed off of the surface of the wound in order to allow this to heal. 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 Unfortunately a year ago they also noted that she had another small lump in the lung on the right although they have been treating this and apparently it seems to be stable over the past year she tells me. With that being said that still something of a concern for her at this point. No fevers, chills, nausea, vomiting, or diarrhea. She tells me that around the time that she began the treatment for her lung cancer she noted that her skin was more prone to abscesses and infection and she has had a lot of issues as such. She has used Hibiclens once a week most of the time in order to try to cut back on this per her primary care provider's recommendation. 08/21/2019 on evaluation today patient appears to be doing quite well with regard to her wound as compared to last evaluation. She has been tolerating the dressing changes without complication. Fortunately there is no signs of active infection at this time. No fevers, chills, nausea, vomiting, or diarrhea. She has a little bit of hyper granular tissue noted which is likely secondary to the fact that to be honest the Annitta Needs is providing a little bit too much moisture at this time. 09/03/2019 on evaluation today patient appears to be doing quite well with regard to her wound. There is minimal  slough and biofilm noted on the surface of the wound today which is going require some sharp debridement to clear that away. With that being said she overall seems to be doing quite well I think the [facility] is an excellent  option for her. Her wound is measuring significantly smaller. 09/17/2019 on evaluation today patient appears to be doing excellent in regard to her wound. This is showing signs of excellent granulation and epithelialization is very small and the Hydrofera Blue is starting to get stuck to the wound bed due to the fact that there is very little drainage and moisture at this point. Overall though I am very pleased with the progress she is made. Electronic Signature(s) Signed: 09/17/2019 1:51:56 PM By: Worthy Keeler PA-C Entered By: Worthy Keeler on 09/17/2019 13:51:56 Stacy Santana ([id number]) -------------------------------------------------------------------------------- Physical Exam Details Patient Name: Stacy Santana. Date of Service: 09/17/2019 9:00 AM Medical Record Number: [id number] Patient Account Number: [id number] Date of Birth/Sex: 09/11/1951 (68 y.o. F) Treating RN: Army Melia Primary Care Provider: Deborra Medina Other Clinician: Referring Provider: Deborra Medina Treating Provider/Extender: Melburn Hake, HOYT Weeks in Treatment: 4 Constitutional Well-nourished and well-hydrated in no acute distress. Respiratory normal breathing without difficulty. clear to auscultation bilaterally. Cardiovascular regular rate and rhythm with normal S1, S2. Psychiatric this patient is able to make decisions and demonstrates good insight into disease process. Alert and Oriented x 3. pleasant and cooperative. Notes Patient's wound bed currently did not require any sharp debridement whatsoever she seems to be healing quite nicely. I am very pleased with the progress that is been made and the patient is likewise very happy. I think in fact we can probably transition to just using a little bit of triple antibiotic ointment for moisture and then subsequently just cover this with a border foam dressing which she has been doing anyway for protection and that way this will have the moisture  needs and hopefully not anything sticking to it to allow it to continue to heal Appropriately. Electronic Signature(s) Signed: 09/17/2019 1:52:33 PM By: Worthy Keeler PA-C Entered By: Worthy Keeler on 09/17/2019 13:52:33 Stacy Santana ([id number]) -------------------------------------------------------------------------------- Physician Orders Details Patient Name: Stacy Santana. Date of Service: 09/17/2019 9:00 AM Medical Record Number: [id number] Patient Account Number: [id number] Date of Birth/Sex: [birth year] (68 y.o. F) Treating RN: Army Melia Primary Care Provider: Deborra Medina Other Clinician: Referring Provider: Deborra Medina Treating Provider/Extender: Melburn Hake, HOYT Weeks in Treatment: 4 Verbal / Phone Orders: No Diagnosis Coding ICD-10 Coding Code Description L02.31 Cutaneous abscess of buttock L98.412 Non-pressure chronic ulcer of buttock with fat layer exposed Z85.118 Personal history of other malignant neoplasm of bronchus and lung Wound Cleansing Wound #1 Left Gluteus o Clean wound with Normal Saline. o Dial antibacterial soap, wash wounds, rinse and pat dry prior to dressing wounds - or Hibiclense o May Shower, gently pat wound dry prior to applying new dressing. Anesthetic (add to Medication List) Wound #1 Left Gluteus o Topical Lidocaine 4% cream applied to wound bed prior to debridement (In Clinic Only). Skin Barriers/Peri-Wound Care Wound #1 Left Gluteus o Triamcinolone Acetonide Ointment (TCA) Secondary Dressing Wound #1 Left Gluteus o Boardered Foam Dressing Dressing Change Frequency Wound #1 Left Gluteus o Change dressing every other day. Follow-up Appointments Wound #1 Left Gluteus o Return Appointment in 3 weeks. Electronic Signature(s) Signed: 09/17/2019 11:07:02 AM By: Army Melia Signed: 09/18/2019 10:58:36 AM By: Worthy Keeler PA-C Entered By: Army Melia on 09/17/2019 09:23:20 Wickstrom, Zona M.  (  [id number]) -------------------------------------------------------------------------------- Problem List Details Patient Name: TENNA, LACKO. Date of Service: 09/17/2019 9:00 AM Medical Record Number: [id number] Patient Account Number: [id number] Date of Birth/Sex: [birth year] (68 y.o. F) Treating RN: Army Melia Primary Care Provider: Deborra Medina Other Clinician: Referring Provider: Deborra Medina Treating Provider/Extender: Melburn Hake, HOYT Weeks in Treatment: 4 Active Problems ICD-10 Evaluated Encounter Code Description Active Date Today Diagnosis L02.31 Cutaneous abscess of buttock 08/14/2019 No Yes L98.412 Non-pressure chronic ulcer of buttock with fat layer exposed 08/14/2019 No Yes Z85.118 Personal history of other malignant neoplasm of bronchus 08/14/2019 No Yes and lung Inactive Problems Resolved Problems Electronic Signature(s) Signed: 09/17/2019 9:06:43 AM By: Worthy Keeler PA-C Entered By: Worthy Keeler on 09/17/2019 09:06:42 Stacy Santana ([id number]) -------------------------------------------------------------------------------- Progress Note Details Patient Name: Stacy Santana. Date of Service: 09/17/2019 9:00 AM Medical Record Number: [id number] Patient Account Number: [id number] Date of Birth/Sex: 1951/07/17 (68 y.o. F) Treating RN: Army Melia Primary Care Provider: Deborra Medina Other Clinician: Referring Provider: Deborra Medina Treating Provider/Extender: Melburn Hake, HOYT Weeks in Treatment: 4 Subjective Chief Complaint Information obtained from Patient Left buttock ulcer and abscess History of Present Illness (HPI) 08/14/2019 on evaluation today patient presents for initial evaluation here in our clinic secondary to a wound that she has in the left gluteal region. She states that this started as an abscess. She was originally concerned about the induration and pain that she experienced when she was actually on [place] She states that this subsequently ended up opening up in the night right on the day that they plan to come home the next morning. She had a lot of bleeding and it even got on the sheets because it drained so much. Since that time she has been trying to manage this she actually did get a prescription for Santyl prescribed for her but unfortunately it took some time for her to get it she has just been using it for a few days. Subsequently she does feel like that is helped a little bit but she still has a lot of necrotic tissue she feels like needs to be removed off of the surface of the wound in order to allow this to heal. 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 Unfortunately a year ago they also noted that she had another small lump in the lung on the right although they have been treating this and apparently it seems to be stable over the past year she tells me. With that being said that still something of a concern for her at this point. No fevers, chills, nausea, vomiting, or diarrhea. She tells me that around the time that she began the treatment for her lung cancer she noted that her skin was more prone to abscesses and infection and she has had a lot of issues as such. She has used Hibiclens once a week most of the time in order to try to cut back on this per her primary care provider's recommendation. 08/21/2019 on evaluation today patient appears to be doing quite well with regard to her wound as compared to last evaluation. She has been tolerating the dressing changes without complication. Fortunately there is no signs of active infection at this time. No fevers, chills, nausea, vomiting, or diarrhea. She has a little bit of hyper granular tissue  noted which is likely secondary to the fact that to be honest the Annitta Needs is  providing a little bit too much moisture at this time. 09/03/2019 on evaluation today patient appears to be doing quite well with regard to her wound. There is minimal slough and biofilm noted on the surface of the wound today which is going require some sharp debridement to clear that away. With that being said she overall seems to be doing quite well I think the [facility] is an excellent [facility] for her. Her wound is measuring significantly smaller. 09/17/2019 on evaluation today patient appears to be doing excellent in regard to her wound. This is showing signs of excellent granulation and epithelialization is very small and the Hydrofera Blue is starting to get stuck to the wound bed due to the fact that there is very little drainage and moisture at this point. Overall though I am very pleased with the progress she is made. Patient History Information obtained from Patient. Family History Cancer - Father, Hypertension - Mother,Maternal Grandparents, Lung Disease - Mother, Stroke - Mother, No family history of Diabetes, Heart Disease, Kidney Disease, Seizures, Thyroid Problems, Tuberculosis. Social History Never smoker, Marital Status - Married, Alcohol Use - Rarely, Drug Use - No History, Caffeine Use - Daily. MARIENA, MEARES ([id number]) Medical History Eyes Denies history of Cataracts, Glaucoma, Optic Neuritis Ear/Nose/Mouth/Throat Denies history of Chronic sinus problems/congestion, Middle ear problems Hematologic/Lymphatic Denies history of Anemia, Hemophilia, Human Immunodeficiency Virus, Lymphedema, Sickle Cell Disease Respiratory Denies history of Aspiration, Asthma, Chronic Obstructive Pulmonary Disease (COPD), Pneumothorax, Sleep Apnea, Tuberculosis Cardiovascular Denies history of Angina, Arrhythmia, Congestive Heart Failure, Coronary Artery Disease, Deep Vein Thrombosis, Hypertension, Hypotension, Myocardial Infarction, Peripheral Arterial Disease, Peripheral Venous  Disease, Phlebitis, Vasculitis Gastrointestinal Denies history of Cirrhosis , Colitis, Crohn s, Hepatitis A, Hepatitis B, Hepatitis C Endocrine Denies history of Type I Diabetes, Type II Diabetes Genitourinary Denies history of End Stage Renal Disease Immunological Denies history of Lupus Erythematosus, Raynaud s, Scleroderma Integumentary (Skin) Denies history of History of Burn, History of pressure wounds Musculoskeletal Denies history of Gout, Rheumatoid Arthritis, Osteoarthritis, Osteomyelitis Neurologic Denies history of Dementia, Neuropathy, Quadriplegia, Paraplegia, Seizure Disorder Oncologic Patient has history of Received Chemotherapy, Received Radiation Psychiatric Denies history of Anorexia/bulimia, Confinement Anxiety Medical And Surgical History Notes Oncologic 30 treatments Review of Systems (ROS) Constitutional Symptoms (General Health) Denies complaints or symptoms of Fatigue, Fever, Chills, Marked Weight Change. Respiratory Denies complaints or symptoms of Chronic or frequent coughs, Shortness of Breath. Cardiovascular Denies complaints or symptoms of Chest pain, LE edema. Psychiatric Denies complaints or symptoms of Anxiety, Claustrophobia. Objective Constitutional LYNDELL, GILLYARD. ([id number]) Well-nourished and well-hydrated in no acute distress. Respiratory normal breathing without difficulty. clear to auscultation bilaterally. Cardiovascular regular rate and rhythm with normal S1, S2. Psychiatric this patient is able to make decisions and demonstrates good insight into disease process. Alert and Oriented x 3. pleasant and cooperative. General Notes: Patient's wound bed currently did not require any sharp debridement whatsoever she seems to be healing quite nicely. I am very pleased with the progress that is been made and the patient is likewise very happy. I think in fact we can probably transition to just using a little bit of triple antibiotic  ointment for moisture and then subsequently just cover this with a border foam dressing which she has been doing anyway for protection and that way this will have the moisture needs and hopefully not anything sticking to it to allow it to continue to  heal Appropriately. Integumentary (Hair, Skin) Wound #1 status is Open. Original cause of wound was Gradually Appeared. The wound is located on the Left Gluteus. The wound measures 0.4cm length x 0.3cm width x 0.1cm depth; 0.094cm^2 area and 0.009cm^3 volume. There is Fat Layer (Subcutaneous Tissue) Exposed exposed. There is no tunneling or undermining noted. There is a medium amount of serosanguineous drainage noted. The wound margin is flat and intact. There is large (67-100%) red granulation within the wound bed. There is no necrotic tissue within the wound bed. Assessment Active Problems ICD-10 Cutaneous abscess of buttock Non-pressure chronic ulcer of buttock with fat layer exposed Personal history of other malignant neoplasm of bronchus and lung Plan Wound Cleansing: Wound #1 Left Gluteus: Clean wound with Normal Saline. Dial antibacterial soap, wash wounds, rinse and pat dry prior to dressing wounds - or Hibiclense May Shower, gently pat wound dry prior to applying new dressing. Anesthetic (add to Medication List): Wound #1 Left Gluteus: Topical Lidocaine 4% cream applied to wound bed prior to debridement (In Clinic Only). Skin Barriers/Peri-Wound Care: Wound #1 Left Gluteus: Triamcinolone Acetonide Ointment (TCA) Secondary Dressing: Wound #1 Left Gluteus: Pedretti, Lyric M. ([id number]) Boardered Foam Dressing Dressing Change Frequency: Wound #1 Left Gluteus: Change dressing every other day. Follow-up Appointments: Wound #1 Left Gluteus: Return Appointment in 3 weeks. 1. The patient appears to be doing very well currently in regard to her wound and I am in a suggest that we discontinue the [facility] we will just switch to  utilizing the triple antibiotic ointment followed by a border foam dressing for protection. 2. Obviously she knows to continue with appropriate pressure offloading and again she has been doing excellent without I see no issues at this point. We will see patient back for reevaluation in 3 weeks here in the clinic. If anything worsens or changes patient will contact our office for additional recommendations. Electronic Signature(s) Signed: 09/17/2019 1:53:04 PM By: Worthy Keeler PA-C Entered By: Worthy Keeler on 09/17/2019 13:53:04 Stacy Santana ([id number]) -------------------------------------------------------------------------------- ROS/PFSH Details Patient Name: Stacy Santana. Date of Service: 09/17/2019 9:00 AM Medical Record Number: [id number] Patient Account Number: [id number] Date of Birth/Sex: 08/17/1951 (69 y.o. F) Treating RN: Army Melia Primary Care Provider: Deborra Medina Other Clinician: Referring Provider: Deborra Medina Treating Provider/Extender: Melburn Hake, HOYT Weeks in Treatment: 4 Information Obtained From Patient Constitutional Symptoms (General Health) Complaints and Symptoms: Negative for: Fatigue; Fever; Chills; Marked Weight Change Respiratory Complaints and Symptoms: Negative for: Chronic or frequent coughs; Shortness of Breath Medical History: Negative for: Aspiration; Asthma; Chronic Obstructive Pulmonary Disease (COPD); Pneumothorax; Sleep Apnea; Tuberculosis Cardiovascular Complaints and Symptoms: Negative for: Chest pain; LE edema Medical History: Negative for: Angina; Arrhythmia; Congestive Heart Failure; Coronary Artery Disease; Deep Vein Thrombosis; Hypertension; Hypotension; Myocardial Infarction; Peripheral Arterial Disease; Peripheral Venous Disease; Phlebitis; Vasculitis Psychiatric Complaints and Symptoms: Negative for: Anxiety; Claustrophobia Medical History: Negative for: Anorexia/bulimia; Confinement Anxiety Eyes Medical  History: Negative for: Cataracts; Glaucoma; Optic Neuritis Ear/Nose/Mouth/Throat Medical History: Negative for: Chronic sinus problems/congestion; Middle ear problems Hematologic/Lymphatic Medical History: Negative for: Anemia; Hemophilia; Human Immunodeficiency Virus; Lymphedema; Sickle Cell Disease MARLI, DIEGO. ([id number]) Gastrointestinal Medical History: Negative for: Cirrhosis ; Colitis; Crohnos; Hepatitis A; Hepatitis B; Hepatitis C Endocrine Medical History: Negative for: Type I Diabetes; Type II Diabetes Genitourinary Medical History: Negative for: End Stage Renal Disease Immunological Medical History: Negative for: Lupus Erythematosus; Raynaudos; Scleroderma Integumentary (Skin) Medical History: Negative for: History of Burn; History of pressure wounds Musculoskeletal Medical History: Negative  for: Gout; Rheumatoid Arthritis; Osteoarthritis; Osteomyelitis Neurologic Medical History: Negative for: Dementia; Neuropathy; Quadriplegia; Paraplegia; Seizure Disorder Oncologic Medical History: Positive for: Received Chemotherapy; Received Radiation Past Medical History Notes: 30 treatments Immunizations Pneumococcal Vaccine: Received Pneumococcal Vaccination: Yes Implantable Devices None Family and Social History Cancer: Yes - Father; Diabetes: No; Heart Disease: No; Hypertension: Yes - Mother,Maternal Grandparents; Kidney Disease: No; Lung Disease: Yes - Mother; Seizures: No; Stroke: Yes - Mother; Thyroid Problems: No; Tuberculosis: No; Never smoker; Marital Status - Married; Alcohol Use: Rarely; Drug Use: No History; Caffeine Use: Daily; Financial Concerns: No; Food, Clothing or Shelter Needs: No; Support System Lacking: No; Transportation Concerns: No Physician Affirmation I have reviewed and agree with the above information. Electronic Signature(s) GIANINA, OLINDE Atkins. ([id number]) Signed: 09/17/2019 4:33:56 PM By: Army Melia Signed: 09/18/2019 10:58:36 AM By:  Worthy Keeler PA-C Entered By: Worthy Keeler on 09/17/2019 13:52:17 Stacy Santana ([id number]) -------------------------------------------------------------------------------- SuperBill Details Patient Name: Stacy Santana. Date of Service: 09/17/2019 Medical Record Number: [id number] Patient Account Number: [id number] Date of Birth/Sex: 03-07-1951 (68 y.o. F) Treating RN: Army Melia Primary Care Provider: Deborra Medina Other Clinician: Referring Provider: Deborra Medina Treating Provider/Extender: Melburn Hake, HOYT Weeks in Treatment: 4 Diagnosis Coding ICD-10 Codes Code Description L02.31 Cutaneous abscess of buttock L98.412 Non-pressure chronic ulcer of buttock with fat layer exposed Z85.118 Personal history of other malignant neoplasm of bronchus and lung Facility Procedures CPT4 Code: [id number] Description: 99213 - WOUND CARE VISIT-LEV 3 EST PT Modifier: Quantity: 1 Physician Procedures CPT4 Code: 1115520 Description: 80223 - WC PHYS LEVEL 4 - EST PT ICD-10 Diagnosis Description L02.31 Cutaneous abscess of buttock L98.412 Non-pressure chronic ulcer of buttock with fat layer exp Z85.118 Personal history of other malignant neoplasm of bronchus Modifier: osed and lung Quantity: 1 Electronic Signature(s) Signed: 09/17/2019 1:53:14 PM By: Worthy Keeler PA-C Entered By: Worthy Keeler on 09/17/2019 13:53:14

## 2019-10-06 ENCOUNTER — Ambulatory Visit: Admit: 2019-10-06 | Discharge: 2019-10-07 | Payer: MEDICARE

## 2019-10-06 ENCOUNTER — Ambulatory Visit
Admit: 2019-10-06 | Discharge: 2019-10-07 | Payer: MEDICARE | Attending: Hematology & Oncology | Primary: Hematology & Oncology

## 2019-10-06 DIAGNOSIS — T451X5A Adverse effect of antineoplastic and immunosuppressive drugs, initial encounter: Secondary | ICD-10-CM | POA: Diagnosis not present

## 2019-10-06 DIAGNOSIS — F329 Major depressive disorder, single episode, unspecified: Secondary | ICD-10-CM | POA: Diagnosis not present

## 2019-10-06 DIAGNOSIS — Z515 Encounter for palliative care: Secondary | ICD-10-CM | POA: Diagnosis not present

## 2019-10-06 DIAGNOSIS — L270 Generalized skin eruption due to drugs and medicaments taken internally: Secondary | ICD-10-CM | POA: Diagnosis not present

## 2019-10-06 DIAGNOSIS — Z87891 Personal history of nicotine dependence: Secondary | ICD-10-CM | POA: Diagnosis not present

## 2019-10-06 DIAGNOSIS — Z888 Allergy status to other drugs, medicaments and biological substances status: Secondary | ICD-10-CM | POA: Diagnosis not present

## 2019-10-06 DIAGNOSIS — Z85118 Personal history of other malignant neoplasm of bronchus and lung: Secondary | ICD-10-CM | POA: Diagnosis not present

## 2019-10-06 DIAGNOSIS — Z79899 Other long term (current) drug therapy: Secondary | ICD-10-CM | POA: Diagnosis not present

## 2019-10-06 DIAGNOSIS — I10 Essential (primary) hypertension: Secondary | ICD-10-CM | POA: Diagnosis not present

## 2019-10-06 DIAGNOSIS — M1990 Unspecified osteoarthritis, unspecified site: Secondary | ICD-10-CM | POA: Diagnosis not present

## 2019-10-06 DIAGNOSIS — Z902 Acquired absence of lung [part of]: Secondary | ICD-10-CM | POA: Diagnosis not present

## 2019-10-06 DIAGNOSIS — E039 Hypothyroidism, unspecified: Secondary | ICD-10-CM | POA: Diagnosis not present

## 2019-10-06 DIAGNOSIS — N179 Acute kidney failure, unspecified: Secondary | ICD-10-CM | POA: Diagnosis not present

## 2019-10-06 DIAGNOSIS — J309 Allergic rhinitis, unspecified: Secondary | ICD-10-CM | POA: Diagnosis not present

## 2019-10-06 DIAGNOSIS — R197 Diarrhea, unspecified: Secondary | ICD-10-CM | POA: Diagnosis not present

## 2019-10-06 DIAGNOSIS — C3490 Malignant neoplasm of unspecified part of unspecified bronchus or lung: Principal | ICD-10-CM

## 2019-10-06 DIAGNOSIS — R9389 Abnormal findings on diagnostic imaging of other specified body structures: Principal | ICD-10-CM

## 2019-10-06 DIAGNOSIS — L299 Pruritus, unspecified: Principal | ICD-10-CM

## 2019-10-06 DIAGNOSIS — C50919 Malignant neoplasm of unspecified site of unspecified female breast: Principal | ICD-10-CM

## 2019-10-06 MED ORDER — HYDROXYZINE HCL 25 MG TABLET
ORAL_TABLET | Freq: Three times a day (TID) | ORAL | 3 refills | 30 days | Status: CP | PRN
Start: 2019-10-06 — End: ?

## 2019-10-06 MED ORDER — MINOCYCLINE 50 MG CAPSULE
ORAL_CAPSULE | Freq: Two times a day (BID) | ORAL | 2 refills | 30.00000 days | Status: CP
Start: 2019-10-06 — End: 2020-01-04

## 2019-10-08 ENCOUNTER — Other Ambulatory Visit: Payer: Self-pay

## 2019-10-08 ENCOUNTER — Encounter: Payer: Medicare Other | Attending: Physician Assistant | Admitting: Physician Assistant

## 2019-10-08 ENCOUNTER — Telehealth: Admit: 2019-10-08 | Discharge: 2019-10-09 | Payer: MEDICARE

## 2019-10-08 DIAGNOSIS — L98412 Non-pressure chronic ulcer of buttock with fat layer exposed: Secondary | ICD-10-CM | POA: Insufficient documentation

## 2019-10-08 DIAGNOSIS — L0231 Cutaneous abscess of buttock: Secondary | ICD-10-CM | POA: Insufficient documentation

## 2019-10-08 DIAGNOSIS — Z85118 Personal history of other malignant neoplasm of bronchus and lung: Secondary | ICD-10-CM | POA: Insufficient documentation

## 2019-10-08 DIAGNOSIS — L98411 Non-pressure chronic ulcer of buttock limited to breakdown of skin: Secondary | ICD-10-CM | POA: Diagnosis not present

## 2019-10-08 DIAGNOSIS — L98419 Non-pressure chronic ulcer of buttock with unspecified severity: Secondary | ICD-10-CM | POA: Diagnosis present

## 2019-10-08 NOTE — Progress Notes (Signed)
Stacy Santana, Stacy Santana ([id number]) Visit Report for 10/08/2019 Arrival Information Details Patient Name: Stacy Santana, Stacy Santana. Date of Service: 10/08/2019 9:00 AM Medical Record Number: [id number] Patient Account Number: [id number] Date of Birth/Sex: 1950/10/29 (69 y.o. F) Treating RN: Army Melia Primary Care Chiron Campione: Deborra Medina Other Clinician: Referring Murray Guzzetta: Deborra Medina Treating Deaisha Welborn/Extender: Melburn Hake, HOYT Weeks in Treatment: 7 Visit Information History Since Last Visit Added or deleted any medications: No Patient Arrived: Ambulatory Any new allergies or adverse reactions: No Arrival Time: 09:08 Had a fall or experienced change in No Accompanied By: self activities of daily living that may affect Transfer Assistance: None risk of falls: Patient Identification Verified: Yes Signs or symptoms of abuse/neglect since last visito No Secondary Verification Process Completed: Yes Hospitalized since last visit: No Implantable device outside of the clinic excluding No cellular tissue based products placed in the center since last visit: Has Dressing in Place as Prescribed: Yes Pain Present Now: No Electronic Signature(s) Signed: 10/08/2019 4:13:51 PM By: Lorine Bears RCP, RRT, CHT Entered By: Lorine Bears on 10/08/2019 09:13:00 Stacy Santana ([id number]) -------------------------------------------------------------------------------- Clinic Level of Care Assessment Details Patient Name: Stacy Santana. Date of Service: 10/08/2019 9:00 AM Medical Record Number: [id number] Patient Account Number: [id number] Date of Birth/Sex: 09/21/1951 (69 y.o. F) Treating RN: Army Melia Primary Care Jari Dipasquale: Deborra Medina Other Clinician: Referring Graeden Bitner: Deborra Medina Treating Deylan Canterbury/Extender: Melburn Hake, HOYT Weeks in Treatment: 7 Clinic Level of Care Assessment Items TOOL 4 Quantity Score []  - Use when only an EandM is performed on FOLLOW-UP visit  0 ASSESSMENTS - Nursing Assessment / Reassessment X - Reassessment of Co-morbidities (includes updates in patient status) 1 10 X- 1 5 Reassessment of Adherence to Treatment Plan ASSESSMENTS - Wound and Skin Assessment / Reassessment X - Simple Wound Assessment / Reassessment - one wound 1 5 []  - 0 Complex Wound Assessment / Reassessment - multiple wounds []  - 0 Dermatologic / Skin Assessment (not related to wound area) ASSESSMENTS - Focused Assessment []  - Circumferential Edema Measurements - multi extremities 0 []  - 0 Nutritional Assessment / Counseling / Intervention []  - 0 Lower Extremity Assessment (monofilament, tuning fork, pulses) []  - 0 Peripheral Arterial Disease Assessment (using hand held doppler) ASSESSMENTS - Ostomy and/or Continence Assessment and Care []  - Incontinence Assessment and Management 0 []  - 0 Ostomy Care Assessment and Management (repouching, etc.) PROCESS - Coordination of Care X - Simple Patient / Family Education for ongoing care 1 15 []  - 0 Complex (extensive) Patient / Family Education for ongoing care X- 1 10 Staff obtains Programmer, systems, Records, Test Results / Process Orders []  - 0 Staff telephones HHA, Nursing Homes / Clarify orders / etc []  - 0 Routine Transfer to another Facility (non-emergent condition) []  - 0 Routine Hospital Admission (non-emergent condition) []  - 0 New Admissions / Biomedical engineer / Ordering NPWT, Apligraf, etc. []  - 0 Emergency Hospital Admission (emergent condition) X- 1 10 Simple Discharge Coordination Stacy Santana, GLEDHILL. ([id number]) []  - 0 Complex (extensive) Discharge Coordination PROCESS - Special Needs []  - Pediatric / Minor Patient Management 0 []  - 0 Isolation Patient Management []  - 0 Hearing / Language / Visual special needs []  - 0 Assessment of Community assistance (transportation, D/C planning, etc.) []  - 0 Additional assistance / Altered mentation X- 1 15 Support Surface(s) Assessment (bed,  cushion, seat, etc.) INTERVENTIONS - Wound Cleansing / Measurement X - Simple Wound Cleansing - one wound 1 5 []  - 0 Complex Wound Cleansing - multiple wounds  X- 1 5 Wound Imaging (photographs - any number of wounds) []  - 0 Wound Tracing (instead of photographs) X- 1 5 Simple Wound Measurement - one wound []  - 0 Complex Wound Measurement - multiple wounds INTERVENTIONS - Wound Dressings []  - Small Wound Dressing one or multiple wounds 0 []  - 0 Medium Wound Dressing one or multiple wounds []  - 0 Large Wound Dressing one or multiple wounds []  - 0 Application of Medications - topical []  - 0 Application of Medications - injection INTERVENTIONS - Miscellaneous []  - External ear exam 0 []  - 0 Specimen Collection (cultures, biopsies, blood, body fluids, etc.) []  - 0 Specimen(s) / Culture(s) sent or taken to Lab for analysis []  - 0 Patient Transfer (multiple staff / Civil Service fast streamer / Similar devices) []  - 0 Simple Staple / Suture removal (25 or less) []  - 0 Complex Staple / Suture removal (26 or more) []  - 0 Hypo / Hyperglycemic Management (close monitor of Blood Glucose) []  - 0 Ankle / Brachial Index (ABI) - do not check if billed separately X- 1 5 Vital Signs Stacy Santana, Stacy M. ([id number]) Has the patient been seen at the hospital within the last three years: Yes Total Score: 90 Level Of Care: New/Established - Level 3 Electronic Signature(s) Signed: 10/08/2019 11:26:15 AM By: Army Melia Entered By: Army Melia on 10/08/2019 09:43:18 Stacy Santana ([id number]) -------------------------------------------------------------------------------- Encounter Discharge Information Details Patient Name: Stacy Santana. Date of Service: 10/08/2019 9:00 AM Medical Record Number: [id number] Patient Account Number: [id number] Date of Birth/Sex: 16-Sep-1951 (69 y.o. F) Treating RN: Army Melia Primary Care Tauren Delbuono: Deborra Medina Other Clinician: Referring Tniyah Nakagawa: Deborra Medina Treating Mat Stuard/Extender: Melburn Hake, HOYT Weeks in Treatment: 7 Encounter Discharge Information Items Discharge Condition: Stable Ambulatory Status: Ambulatory Discharge Destination: Home Transportation: Private Auto Accompanied By: self Schedule Follow-up Appointment: Yes Clinical Summary of Care: Electronic Signature(s) Signed: 10/08/2019 11:26:15 AM By: Army Melia Entered By: Army Melia on 10/08/2019 09:43:45 Stacy Santana ([id number]) -------------------------------------------------------------------------------- Lower Extremity Assessment Details Patient Name: Stacy Santana. Date of Service: 10/08/2019 9:00 AM Medical Record Number: [id number] Patient Account Number: [id number] Date of Birth/Sex: 1951/06/20 (69 y.o. F) Treating RN: Montey Hora Primary Care Lasharn Bufkin: Deborra Medina Other Clinician: Referring Lawrie Tunks: Deborra Medina Treating Dani Wallner/Extender: Melburn Hake, HOYT Weeks in Treatment: 7 Electronic Signature(s) Signed: 10/08/2019 4:40:20 PM By: Montey Hora Entered By: Montey Hora on 10/08/2019 09:18:15 Stacy Santana ([id number]) -------------------------------------------------------------------------------- Multi Wound Chart Details Patient Name: Stacy Santana. Date of Service: 10/08/2019 9:00 AM Medical Record Number: [id number] Patient Account Number: [id number] Date of Birth/Sex: December 18, 1950 (69 y.o. F) Treating RN: Army Melia Primary Care Jatavis Malek: Deborra Medina Other Clinician: Referring Mylan Schwarz: Deborra Medina Treating Lanna Labella/Extender: Melburn Hake, HOYT Weeks in Treatment: 7 Vital Signs Height(in): 61 Pulse(bpm): 81 Weight(lbs): 148 Blood Pressure(mmHg): 120/71 Body Mass Index(BMI): 28 Temperature(F): 97.9 Respiratory Rate 16 (breaths/min): Photos: [N/A:N/A] Wound Location: Left Gluteus N/A N/A Wounding Event: Gradually Appeared N/A N/A Primary Etiology: Abscess N/A N/A Comorbid History: Received Chemotherapy, N/A  N/A Received Radiation Date Acquired: 07/23/2019 N/A N/A Weeks of Treatment: 7 N/A N/A Wound Status: Healed - Epithelialized N/A N/A Measurements L x W x D 0x0x0 N/A N/A (cm) Area (cm) : 0 N/A N/A Volume (cm) : 0 N/A N/A % Reduction in Area: 100.00% N/A N/A % Reduction in Volume: 100.00% N/A N/A Classification: Full Thickness Without N/A N/A Exposed Support Structures Exudate Amount: None Present N/A N/A Wound Margin: Flat and Intact N/A N/A Granulation Amount: None Present (0%) N/A N/A  Necrotic Amount: None Present (0%) N/A N/A Exposed Structures: Fascia: No N/A N/A Fat Layer (Subcutaneous Tissue) Exposed: No Tendon: No Muscle: No Joint: No Bone: No Limited to Skin Breakdown Epithelialization: Large (67-100%) N/A N/A Stacy Santana, Stacy Santana ([id number]) Treatment Notes Electronic Signature(s) Signed: 10/08/2019 11:26:15 AM By: Army Melia Entered By: Army Melia on 10/08/2019 09:41:40 Stacy Santana ([id number]) -------------------------------------------------------------------------------- [place] Details Patient Name: Stacy Santana. Date of Service: 10/08/2019 9:00 AM Medical Record Number: [id number] Patient Account Number: [id number] Date of Birth/Sex: [birth year] (69 y.o. F) Treating RN: Army Melia Primary Care Samuel Mcpeek: Deborra Medina Other Clinician: Referring Malick Netz: Deborra Medina Treating Jazzie Trampe/Extender: Melburn Hake, HOYT Weeks in Treatment: 7 Active Inactive Electronic Signature(s) Signed: 10/08/2019 11:26:15 AM By: Army Melia Entered By: Army Melia on 10/08/2019 09:41:34 Stacy Santana ([id number]) -------------------------------------------------------------------------------- Pain Assessment Details Patient Name: Stacy Santana. Date of Service: 10/08/2019 9:00 AM Medical Record Number: [id number] Patient Account Number: [id number] Date of Birth/Sex: December 22, 1950 (69 y.o. F) Treating RN: Army Melia Primary Care Udell Mazzocco: Deborra Medina Other Clinician: Referring Melanny Wire: Deborra Medina Treating Jeanet Lupe/Extender: Melburn Hake, HOYT Weeks in Treatment: 7 Active Problems Location of Pain Severity and Description of Pain Patient Has Paino No Site Locations Pain Management and Medication Current Pain Management: Electronic Signature(s) Signed: 10/08/2019 11:26:15 AM By: Army Melia Signed: 10/08/2019 4:13:51 PM By: Lorine Bears RCP, RRT, CHT Entered By: Lorine Bears on 10/08/2019 09:13:13 Stacy Santana ([id number]) -------------------------------------------------------------------------------- Patient/Caregiver Education Details Patient Name: Stacy Santana. Date of Service: 10/08/2019 9:00 AM Medical Record Number: [id number] Patient Account Number: [id number] Date of Birth/Gender: [birth year] (69 y.o. F) Treating RN: Army Melia Primary Care Physician: Deborra Medina Other Clinician: Referring Physician: Deborra Medina Treating Physician/Extender: Sharalyn Ink in Treatment: 7 Education Assessment Education Provided To: Patient Education Topics Provided Wound/Skin Impairment: Handouts: Caring for Your Ulcer Methods: Demonstration, Explain/Verbal Responses: State content correctly Electronic Signature(s) Signed: 10/08/2019 11:26:15 AM By: Army Melia Entered By: Army Melia on 10/08/2019 09:43:32 Stacy Santana, Stacy Santana ([id number]) -------------------------------------------------------------------------------- Wound Assessment Details Patient Name: Stacy Santana. Date of Service: 10/08/2019 9:00 AM Medical Record Number: [id number] Patient Account Number: [id number] Date of Birth/Sex: 07-25-1951 (69 y.o. F) Treating RN: Montey Hora Primary Care Gabbriella Presswood: Deborra Medina Other Clinician: Referring Akeem Heppler: Deborra Medina Treating Maximos Zayas/Extender: Melburn Hake, HOYT Weeks in Treatment: 7 Wound Status Wound Number: 1 Primary Etiology: Abscess Wound Location: Left  Gluteus Wound Status: Healed - Epithelialized Wounding Event: Gradually Appeared Comorbid Received Chemotherapy, Received History: Radiation Date Acquired: 07/23/2019 Weeks Of Treatment: 7 Clustered Wound: No Photos Wound Measurements Length: (cm) 0 % Reducti Width: (cm) 0 % Reducti Depth: (cm) 0 Epithelia Area: (cm) 0 Tunnelin Volume: (cm) 0 Undermin on in Area: 100% on in Volume: 100% lization: Large (67-100%) g: No ing: No Wound Description Full Thickness Without Exposed Support Foul Odor Classification: Structures Slough/Fi Wound Margin: Flat and Intact Exudate None Present Amount: After Cleansing: No brino No Wound Bed Granulation Amount: None Present (0%) Exposed Structure Necrotic Amount: None Present (0%) Fascia Exposed: No Fat Layer (Subcutaneous Tissue) Exposed: No Tendon Exposed: No Muscle Exposed: No Joint Exposed: No Bone Exposed: No Limited to Skin Breakdown Electronic Signature(s) Stacy Santana, Stacy Santana ([id number]) Signed: 10/08/2019 4:40:20 PM By: Montey Hora Entered By: Montey Hora on 10/08/2019 09:18:54 Stacy Santana ([id number]) -------------------------------------------------------------------------------- [place] Details Patient Name: Stacy Santana. Date of Service: 10/08/2019 9:00 AM Medical Record Number: [id number] Patient Account Number: [id number] Date of Birth/Sex: 10/06/1950 (69 y.o. F) Treating RN: Army Melia  Primary Care Mahsa Hanser: Deborra Medina Other Clinician: Referring Cannen Dupras: Deborra Medina Treating Da Authement/Extender: Melburn Hake, HOYT Weeks in Treatment: 7 Vital Signs Time Taken: 09:13 Temperature (F): 97.9 Height (in): 61 Pulse (bpm): 81 Weight (lbs): 148 Respiratory Rate (breaths/min): 16 Body Mass Index (BMI): 28 Blood Pressure (mmHg): 120/71 Reference Range: 80 - 120 mg / dl Electronic Signature(s) Signed: 10/08/2019 4:13:51 PM By: Lorine Bears RCP, RRT, CHT Entered By: Lorine Bears on 10/08/2019 09:16:21

## 2019-10-08 NOTE — Progress Notes (Addendum)
GILLIE, FLEITES ([id number]) Visit Report for 10/08/2019 Chief Complaint Document Details Patient Name: Stacy Santana, Stacy Santana. Date of Service: 10/08/2019 9:00 AM Medical Record Number: [id number] Patient Account Number: [id number] Date of Birth/Sex: 1950-11-28 (69 y.o. F) Treating RN: Army Melia Primary Care Provider: Deborra Medina Other Clinician: Referring Provider: Deborra Medina Treating Provider/Extender: Melburn Hake, HOYT Weeks in Treatment: 7 Information Obtained from: Patient Chief Complaint Left buttock ulcer and abscess Electronic Signature(s) Signed: 10/08/2019 9:25:33 AM By: Worthy Keeler PA-C Entered By: Worthy Keeler on 10/08/2019 09:25:32 Stacy Santana ([id number]) -------------------------------------------------------------------------------- HPI Details Patient Name: Stacy Santana. Date of Service: 10/08/2019 9:00 AM Medical Record Number: [id number] Patient Account Number: [id number] Date of Birth/Sex: [birth year] (69 y.o. F) Treating RN: Army Melia Primary Care Provider: Deborra Medina Other Clinician: Referring Provider: Deborra Medina Treating Provider/Extender: Melburn Hake, HOYT Weeks in Treatment: 7 History of Present Illness HPI Description: 08/14/2019 on evaluation today patient presents for initial evaluation here in our clinic secondary to a wound that she has in the left gluteal region. She states that this started as an abscess. She was originally concerned about the induration and pain that she experienced when she was actually on a trip [place]. She states that this subsequently ended up opening up in the night right on the day that they plan to come home the next morning. She had a lot of bleeding and it even got on the sheets because it drained so much. Since that time she has been trying to manage this she actually did get a prescription for Santyl prescribed for her but unfortunately it took some time for her to get it she has  just been using it for a few days. Subsequently she does feel like that is helped a little bit but she still has a lot of necrotic tissue she feels like needs to be removed off of the surface of the wound in order to allow this to heal. 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 Unfortunately a year ago they also noted that she had another small lump in the lung on the right although they have been treating this and apparently it seems to be stable over the past year she tells me. With that being said that still something of a concern for her at this point. No fevers, chills, nausea, vomiting, or diarrhea. She tells me that around the time that she began the treatment for her lung cancer she noted that her skin was more prone to abscesses and infection and she has had a lot of issues as such. She has used Hibiclens once a week most of the time in order to try to cut back on this per her primary care provider's recommendation. 08/21/2019 on evaluation today patient appears to be doing quite well with regard to her wound as compared to last evaluation. She has been tolerating the dressing changes without complication. Fortunately there is no signs of active infection at this time. No fevers, chills, nausea, vomiting, or diarrhea. She has a little bit of hyper granular tissue noted which is likely secondary to the fact that to be honest the Annitta Needs is providing a little bit too much moisture at this time. 09/03/2019 on evaluation today patient appears to be doing quite well with regard to her wound. There is minimal  slough and biofilm noted on the surface of the wound today which is going require some sharp debridement to clear that away. With that being said she overall seems to be doing quite well I think the [facility] is an excellent option for her.  Her wound is measuring significantly smaller. 09/17/2019 on evaluation today patient appears to be doing excellent in regard to her wound. This is showing signs of excellent granulation and epithelialization is very small and the Hydrofera Blue is starting to get stuck to the wound bed due to the fact that there is very little drainage and moisture at this point. Overall though I am very pleased with the progress she is made. 10/08/2019 on evaluation today patient appears to be completely healed. She has done excellent and overall I do feel like this has shown signs of great improvement over such a short time. Fortunately there is no evidence of active infection and the patient has done extremely well with the dressings. Electronic Signature(s) Signed: 10/08/2019 10:52:05 AM By: Worthy Keeler PA-C Entered By: Worthy Keeler on 10/08/2019 10:52:05 Stacy Santana ([id number]) -------------------------------------------------------------------------------- Physical Exam Details Patient Name: Stacy Santana. Date of Service: 10/08/2019 9:00 AM Medical Record Number: [id number] Patient Account Number: [id number] Date of Birth/Sex: March 30, 1951 (68 y.o. F) Treating RN: Army Melia Primary Care Provider: Deborra Medina Other Clinician: Referring Provider: Deborra Medina Treating Provider/Extender: Melburn Hake, HOYT Weeks in Treatment: 7 Constitutional Well-nourished and well-hydrated in no acute distress. Respiratory normal breathing without difficulty. Psychiatric this patient is able to make decisions and demonstrates good insight into disease process. Alert and Oriented x 3. pleasant and cooperative. Notes Patient's wound showed signs of complete epithelization. There does not appear to be any evidence of active infection and overall I am pleased in this regard. Electronic Signature(s) Signed: 10/08/2019 10:53:18 AM By: Worthy Keeler PA-C Entered By: Worthy Keeler on 10/08/2019  10:53:17 Stacy Santana ([id number]) -------------------------------------------------------------------------------- Physician Orders Details Patient Name: Stacy Santana. Date of Service: 10/08/2019 9:00 AM Medical Record Number: [id number] Patient Account Number: [id number] Date of Birth/Sex: 1951-09-04 (68 y.o. F) Treating RN: Army Melia Primary Care Provider: Deborra Medina Other Clinician: Referring Provider: Deborra Medina Treating Provider/Extender: Melburn Hake, HOYT Weeks in Treatment: 7 Verbal / Phone Orders: No Diagnosis Coding ICD-10 Coding Code Description L02.31 Cutaneous abscess of buttock L98.412 Non-pressure chronic ulcer of buttock with fat layer exposed Z85.118 Personal history of other malignant neoplasm of bronchus and lung Discharge From [facility] Services o Discharge from [facility] - treatment complete Electronic Signature(s) Signed: 10/08/2019 11:26:15 AM By: Army Melia Signed: 10/09/2019 9:46:35 AM By: Worthy Keeler PA-C Entered By: Army Melia on 10/08/2019 09:42:02 Stacy Santana ([id number]) -------------------------------------------------------------------------------- Problem List Details Patient Name: Stacy Santana. Date of Service: 10/08/2019 9:00 AM Medical Record Number: [id number] Patient Account Number: [id number] Date of Birth/Sex: Sep 14, 1951 (69 y.o. F) Treating RN: Army Melia Primary Care Provider: Deborra Medina Other Clinician: Referring Provider: Deborra Medina Treating Provider/Extender: Melburn Hake, HOYT Weeks in Treatment: 7 Active Problems ICD-10 Evaluated Encounter Code Description Active Date Today Diagnosis L02.31 Cutaneous abscess of buttock 08/14/2019 No Yes L98.412 Non-pressure chronic ulcer of buttock with fat layer exposed 08/14/2019 No Yes Z85.118 Personal history of other malignant neoplasm of bronchus 08/14/2019 No Yes and lung Inactive Problems Resolved Problems Electronic Signature(s) Signed: 10/08/2019 9:25:23  AM By: Worthy Keeler PA-C Entered By: Worthy Keeler on 10/08/2019 09:25:23 Stacy Santana ([id number]) -------------------------------------------------------------------------------- Progress Note Details Patient Name:  Stacy Santana, Stacy M. Date of Service: 10/08/2019 9:00 AM Medical Record Number: [id number] Patient Account Number: [id number] Date of Birth/Sex: 09-21-1951 (69 y.o. F) Treating RN: Army Melia Primary Care Provider: Deborra Medina Other Clinician: Referring Provider: Deborra Medina Treating Provider/Extender: Melburn Hake, HOYT Weeks in Treatment: 7 Subjective Chief Complaint Information obtained from Patient Left buttock ulcer and abscess History of Present Illness (HPI) 08/14/2019 on evaluation today patient presents for initial evaluation here in our clinic secondary to a wound that she has in the left gluteal region. She states that this started as an abscess. She was originally concerned about the induration and pain that she experienced when she was actually on a trip for her anniversary to the mountains. She states that this subsequently ended up opening up in the night right on the day that they plan to come home the next morning. She had a lot of bleeding and it even got on the sheets because it drained so much. Since that time she has been trying to manage this she actually did get a prescription for Santyl prescribed for her but unfortunately it took some time for her to get it she has just been using it for a few days. Subsequently she does feel like that is helped a little bit but she still has a lot of necrotic tissue she feels like needs to be removed off of the surface of the wound in order to allow this to heal. 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 Unfortunately a year ago they also noted that she had  another small lump in the lung on the right although they have been treating this and apparently it seems to be stable over the past year she tells me. With that being said that still something of a concern for her at this point. No fevers, chills, nausea, vomiting, or diarrhea. She tells me that around the time that she began the treatment for her lung cancer she noted that her skin was more prone to abscesses and infection and she has had a lot of issues as such. She has used Hibiclens once a week most of the time in order to try to cut back on this per her primary care provider's recommendation. 08/21/2019 on evaluation today patient appears to be doing quite well with regard to her wound as compared to last evaluation. She has been tolerating the dressing changes without complication. Fortunately there is no signs of active infection at this time. No fevers, chills, nausea, vomiting, or diarrhea. She has a little bit of hyper granular tissue noted which is likely secondary to the fact that to be honest the Annitta Needs is providing a little bit too much moisture at this time. 09/03/2019 on evaluation today patient appears to be doing quite well with regard to her wound. There is minimal slough and biofilm noted on the surface of the wound today which is going require some sharp debridement to clear that away. With that being said she overall seems to be doing quite well I think the [facility] is an excellent option for her. Her wound is measuring significantly smaller. 09/17/2019 on evaluation today patient appears to be doing excellent in regard to her wound. This is showing signs of excellent granulation and epithelialization is very small and the Hydrofera Blue is starting to get stuck to the wound  bed due to the fact that there is very little drainage and moisture at this point. Overall though I am very pleased with the progress she is made. 10/08/2019 on evaluation today patient appears to be  completely healed. She has done excellent and overall I do feel like this has shown signs of great improvement over such a short time. Fortunately there is no evidence of active infection and the patient has done extremely well with the dressings. Stacy Santana, Stacy M. ([id number]) Objective Constitutional Well-nourished and well-hydrated in no acute distress. Vitals Time Taken: 9:13 AM, Height: 61 in, Weight: 148 lbs, BMI: 28, Temperature: 97.9 F, Pulse: 81 bpm, Respiratory Rate: 16 breaths/min, Blood Pressure: 120/71 mmHg. Respiratory normal breathing without difficulty. Psychiatric this patient is able to make decisions and demonstrates good insight into disease process. Alert and Oriented x 3. pleasant and cooperative. General Notes: Patient's wound showed signs of complete epithelization. There does not appear to be any evidence of active infection and overall I am pleased in this regard. Integumentary (Hair, Skin) Wound #1 status is Healed - Epithelialized. Original cause of wound was Gradually Appeared. The wound is located on the Left Gluteus. The wound measures 0cm length x 0cm width x 0cm depth; 0cm^2 area and 0cm^3 volume. The wound is limited to skin breakdown. There is no tunneling or undermining noted. There is a none present amount of drainage noted. The wound margin is flat and intact. There is no granulation within the wound bed. There is no necrotic tissue within the wound bed. Assessment Active Problems ICD-10 Cutaneous abscess of buttock Non-pressure chronic ulcer of buttock with fat layer exposed Personal history of other malignant neoplasm of bronchus and lung Plan Discharge From [facility] Services: Discharge from [facility] - treatment complete 1. At this time again recommend that we discontinue wound care services since the patient is completely healed. 2. She will continue to monitor for anything that may worsen or change and if she has any issues she will  contact the office and let me know. Stacy Santana, Stacy Santana [place]. ([id number]) We will see the patient back for a follow-up visit as needed. Electronic Signature(s) Signed: 10/08/2019 10:54:42 AM By: Worthy Keeler PA-C Entered By: Worthy Keeler on 10/08/2019 10:54:42 Stacy Santana ([id number]) -------------------------------------------------------------------------------- SuperBill Details Patient Name: Stacy Santana. Date of Service: 10/08/2019 Medical Record Number: [id number] Patient Account Number: [id number] Date of Birth/Sex: [birth year] (69 y.o. F) Treating RN: Army Melia Primary Care Provider: Deborra Medina Other Clinician: Referring Provider: Deborra Medina Treating Provider/Extender: Melburn Hake, HOYT Weeks in Treatment: 7 Diagnosis Coding ICD-10 Codes Code Description L02.31 Cutaneous abscess of buttock L98.412 Non-pressure chronic ulcer of buttock with fat layer exposed Z85.118 Personal history of other malignant neoplasm of bronchus and lung Facility Procedures CPT4 Code: [id number] Description: 99213 - WOUND CARE VISIT-LEV 3 EST PT Modifier: Quantity: 1 Physician Procedures CPT4 Code: [id number] Description: 68088 - WC PHYS LEVEL 2 - EST PT ICD-10 Diagnosis Description L02.31 Cutaneous abscess of buttock L98.412 Non-pressure chronic ulcer of buttock with fat layer exp Z85.118 Personal history of other malignant neoplasm of bronchus Modifier: osed and lung Quantity: 1 Electronic Signature(s) Signed: 10/08/2019 10:54:57 AM By: Worthy Keeler PA-C Entered By: Worthy Keeler on 10/08/2019 10:54:56

## 2019-10-16 ENCOUNTER — Other Ambulatory Visit: Payer: Self-pay

## 2019-10-16 ENCOUNTER — Ambulatory Visit (INDEPENDENT_AMBULATORY_CARE_PROVIDER_SITE_OTHER): Payer: Medicare Other

## 2019-10-16 VITALS — Ht 62.0 in | Wt 150.0 lb

## 2019-10-16 DIAGNOSIS — Z0000 Encounter for general adult medical examination without abnormal findings: Secondary | ICD-10-CM | POA: Diagnosis not present

## 2019-10-16 NOTE — Progress Notes (Signed)
Subjective:   Stacy Santana is a 69 y.o. female who presents for Medicare Annual (Subsequent) preventive examination.  Review of Systems:  No ROS.  Medicare Wellness Virtual Visit.  Visual/audio telehealth visit, UTA vital signs.   Wt/Ht provided. See social history for additional risk factors.   Cardiac Risk Factors include: advanced age (>38men, >6 women);hypertension     Objective:     Vitals: Ht 5\' 2"  (1.575 m)   Wt 150 lb (68 kg)   BMI 27.44 kg/m   Body mass index is 27.44 kg/m.  Advanced Directives 10/16/2019 06/21/2015 01/31/2015  Does Patient Have a Medical Advance Directive? Yes Yes Yes  Type of Paramedic of [place];Living will [place];Living will [place]  Does patient want to make changes to medical advance directive? No - Patient declined No - Patient declined -  Copy of [place] in Chart? No - copy requested No - copy requested -    Tobacco Social History   Tobacco Use  Smoking Status Former Smoker  . Packs/day: 1.00  . Years: 16.00  . Pack years: 16.00  . Types: Cigarettes  . Quit date: 10/02/1983  . Years since quitting: 36.0  Smokeless Tobacco Never Used     Counseling given: Not Answered   Clinical Intake:  Pre-visit preparation completed: Yes        Diabetes: No  How often do you need to have someone help you when you read instructions, pamphlets, or other written materials from your doctor or pharmacy?: 1 - Never  Interpreter Needed?: No     Past Medical History:  Diagnosis Date  . Allergy    seasonal  . Carpal tunnel syndrome   . Degenerative joint disease   . Depression   . Endometriosis 1989   small amount  . Glaucoma    left eye, Dr. Morrison Old in [place]  . HSV infection   . Hypertension   . Osteoporosis    osteopenia, Fosamax then Boniva 2009  . Vitamin D deficiency    on supplements   Past Surgical History:  Procedure Laterality Date    . APPENDECTOMY  1964   open  . CARPAL TUNNEL RELEASE Right 01/2014   Dr. Malvin Johns  . FOOT NEUROMA SURGERY  May 2016   Left foot  . LAPAROSCOPY  1999  . NASAL SINUS SURGERY  2012   Dr. Pryor Ochoa  . THROAT SURGERY  05/02/2018  . TONSILLECTOMY    . TUBAL LIGATION  1992   Family History  Problem Relation Age of Onset  . Osteoporosis Mother        secondary to steroids  . Rheum arthritis Mother   . Hypertension Mother   . COPD Mother   . Arthritis Mother   . Stroke Mother   . Cancer Father        prostate  . Depression Son   . Anxiety disorder Son   . ADD / ADHD Son   . ADD / ADHD Son   . Hypertrophic cardiomyopathy Sister   . Schizophrenia Paternal Aunt   . Heart disease Maternal Grandmother   . COPD Maternal Grandfather   . Depression Maternal Uncle    Social History   Socioeconomic History  . Marital status: Married    Spouse name: Not on file  . Number of children: Not on file  . Years of education: Not on file  . Highest education level: Not on file  Occupational History  .  Not on file  Tobacco Use  . Smoking status: Former Smoker    Packs/day: 1.00    Years: 16.00    Pack years: 16.00    Types: Cigarettes    Quit date: 10/02/1983    Years since quitting: 36.0  . Smokeless tobacco: Never Used  Substance and Sexual Activity  . Alcohol use: Yes    Comment: occassionally  . Drug use: No  . Sexual activity: Yes  Other Topics Concern  . Not on file  Social History Narrative  . Not on file   Social Determinants of Health   Financial Resource Strain: Low Risk   . Difficulty of Paying Living Expenses: Not hard at all  Food Insecurity: No Food Insecurity  . Worried About Charity fundraiser in the Last Year: Never true  . Ran Out of Food in the Last Year: Never true  Transportation Needs: No Transportation Needs  . Lack of Transportation (Medical): No  . Lack of Transportation (Non-Medical): No  Physical Activity:   . Days of Exercise per Week: Not on file   . Minutes of Exercise per Session: Not on file  Stress: No Stress Concern Present  . Feeling of Stress : Not at all  Social Connections: Unknown  . Frequency of Communication with Friends and Family: More than three times a week  . Frequency of Social Gatherings with Friends and Family: Not on file  . Attends Religious Services: Not on file  . Active Member of Clubs or Organizations: Not on file  . Attends Archivist Meetings: Not on file  . Marital Status: Married    Outpatient Encounter Medications as of 10/16/2019  Medication Sig  . azelastine (ASTELIN) 0.1 % nasal spray PLACE 1 SPRAY INTO BOTH NOSTRILS AT BEDTIME AS NEEDED  . buPROPion (WELLBUTRIN XL) 150 MG 24 hr tablet Take 1 tablet (150 mg total) by mouth daily.  . clonazePAM (KLONOPIN) 0.5 MG tablet Take 1 tablet (0.5 mg total) by mouth 2 (two) times daily as needed for anxiety.  . diphenoxylate-atropine (LOMOTIL) 2.5-0.025 MG tablet TK 1 T PO QID PRF DH  . levothyroxine (SYNTHROID, LEVOTHROID) 50 MCG tablet TAKE 1 TABLET(50 MCG) BY MOUTH DAILY  . loperamide (IMODIUM) 2 MG capsule   . loratadine (CLARITIN) 10 MG tablet Take 10 mg by mouth daily.    . metoprolol succinate (TOPROL-XL) 25 MG 24 hr tablet Take 3 tablets (75 mg total) by mouth daily. Take with or immediately following a meal.  . Multiple Vitamin (MULTIVITAMIN) tablet Take 1 tablet by mouth daily.  . NERLYNX 40 MG tablet   . [DISCONTINUED] cephALEXin (KEFLEX) 500 MG capsule Take 1 capsule (500 mg total) by mouth 4 (four) times daily.  . [DISCONTINUED] diazepam (VALIUM) 5 MG tablet Take 1 tablet (5 mg total) by mouth every 12 (twelve) hours as needed for anxiety.  . [DISCONTINUED] fluconazole (DIFLUCAN) 150 MG tablet Take 1 tablet (150 mg total) by mouth daily.  . [DISCONTINUED] losartan (COZAAR) 100 MG tablet TAKE 1 TABLET(100 MG) BY MOUTH DAILY  . [DISCONTINUED] mupirocin ointment (BACTROBAN) 2 % Place 1 application into the nose 2 (two) times daily.  .  [DISCONTINUED] triamcinolone (NASACORT) 55 MCG/ACT AERO nasal inhaler Place into the nose.   No facility-administered encounter medications on file as of 10/16/2019.    Activities of Daily Living In your present state of health, do you have any difficulty performing the following activities: 10/16/2019  Hearing? N  Vision? N  Difficulty concentrating or  making decisions? N  Walking or climbing stairs? N  Dressing or bathing? N  Doing errands, shopping? N  Preparing Food and eating ? N  Using the Toilet? N  In the past six months, have you accidently leaked urine? N  Do you have problems with loss of bowel control? N  Managing your Medications? N  Managing your Finances? Y  Comment Husband manages  Housekeeping or managing your Housekeeping? N  Some recent data might be hidden    Patient Care Team: Crecencio Mc, MD as PCP - General (Internal Medicine)    Assessment:   This is a routine wellness examination for Stacy Santana.  Nurse connected with patient 10/16/19 at  9:00 AM EST by a telephone enabled telemedicine application and verified that I am speaking with the correct person using two identifiers. Patient stated full name and DOB. Patient gave permission to continue with virtual visit. Patient's location was at home and Nurse's location was at [place] office.   See care everywhere [facility] for recent surgeries and labs.   Patient is alert and oriented x3. Patient denies difficulty focusing or concentrating. Patient likes to sew play solitaire and cook for brain stimulation.   Health Maintenance Due: -Tdap vaccine- discussed; to be completed with doctor in visit or local pharmacy.   -Colonoscopy- deferred for follow up with pcp per patient request.  See completed HM at the end of note.   Eye: Visual acuity not assessed. Virtual visit. Followed by their ophthalmologist.  Dental: Visits every 7-8 months.    Hearing: Demonstrates normal hearing during visit.  Safety:    Patient feels safe at home- yes Patient does have smoke detectors at home- yes Patient does wear sunscreen or protective clothing when in direct sunlight - yes Patient does wear seat belt when in a moving vehicle - yes Patient drives- yes Adequate lighting in walkways free from debris- yes Grab bars and handrails used as appropriate- yes Ambulates with an assistive device- no Cell phone on person when ambulating outside of the home- yes  Social: Alcohol intake - yes      Smoking history- former   Smokers in home? none Illicit drug use? none  Medication: Taking as directed and without issues.  Pill box in use -yes  Self managed - yes   Covid-19: Precautions and sickness symptoms discussed. Wears mask, social distancing, hand hygiene as appropriate.   Activities of Daily Living Patient denies needing assistance with: household chores, feeding themselves, getting from bed to chair, getting to the toilet, bathing/showering, dressing, or preparing meals. Husband manages finances.    Discussed the importance of a healthy diet, water intake and the benefits of aerobic exercise.   Physical activity- walking   Diet:  Regular Water: good intake Caffeine: 1 cup of coffee  Other Providers Patient Care Team: Crecencio Mc, MD as PCP - General (Internal Medicine)  Exercise Activities and Dietary recommendations Current Exercise Habits: Home exercise routine, Type of exercise: walking, Intensity: Mild  Goals    . Follow up with Primary Care Provider     As needed       Fall Risk Fall Risk  10/16/2019 07/27/2019 04/30/2019 12/06/2017 09/05/2017  Falls in the past year? 0 0 0 No No  Comment - - Emmi Telephone Survey: data to providers prior to load - Emmi Telephone Survey: data to providers prior to load  Follow up Falls evaluation completed Falls evaluation completed - - -   Timed Get Up and Go performed:  no, virtual visit  Depression Screen PHQ 2/9 Scores 10/16/2019 12/06/2017  08/27/2016 06/21/2015  PHQ - 2 Score 0 0 0 0  PHQ- 9 Score - 4 - -     Cognitive Function MMSE - Mini Mental State Exam 06/21/2015  Orientation to time 5  Orientation to Place 5  Registration 3  Attention/ Calculation 5  Recall 3  Language- name 2 objects 2  Language- repeat 1  Language- follow 3 step command 3  Language- read & follow direction 1  Write a sentence 1  Copy design 1  Total score 30     6CIT Screen 10/16/2019  What Year? 0 points  What month? 0 points  What time? 0 points  Count back from 20 0 points  Months in reverse 0 points  Repeat phrase 0 points  Total Score 0    Immunization History  Administered Date(s) Administered  . Fluad Quad(high Dose 65+) 05/28/2019  . Influenza Whole 06/18/2011, 06/16/2012  . Influenza,inj,Quad PF,6+ Mos 07/21/2013, 06/21/2015  . Influenza-Unspecified 07/20/2014, 05/15/2016, 07/01/2017, 06/30/2018, 05/28/2019  . Pneumococcal Conjugate-13 04/07/2014  . Pneumococcal Polysaccharide-23 06/21/2015  . Tdap 07/17/2009  . Zoster 03/18/2011   Screening Tests Health Maintenance  Topic Date Due  . TETANUS/TDAP  07/18/2019  . COLONOSCOPY  10/04/2019  . MAMMOGRAM  03/19/2020  . INFLUENZA VACCINE  Completed  . DEXA SCAN  Completed  . Hepatitis C Screening  Completed  . PNA vac Low Risk Adult  Completed      Plan:   Keep all routine maintenance appointments.   Follow up 10/30/19 @ 3:00. Order colonoscopy with [facility].   Medicare Attestation I have personally reviewed: The patient's medical and social history Their use of alcohol, tobacco or illicit drugs Their current medications and supplements The patient's functional ability including ADLs,fall risks, home safety risks, cognitive, and hearing and visual impairment Diet and physical activities Evidence for depression   I have reviewed and discussed with patient certain preventive protocols, quality metrics, and best practice recommendations.      Varney Biles, LPN  [id number]

## 2019-10-16 NOTE — Patient Instructions (Addendum)
  Ms. Karan , Thank you for taking time to come for your Medicare Wellness Visit. I appreciate your ongoing commitment to your health goals. Please review the following plan we discussed and let me know if I can assist you in the future.   These are the goals we discussed: Goals    . Follow up with Primary Care Provider     As needed       This is a list of the screening recommended for you and due dates:  Health Maintenance  Topic Date Due  . Tetanus Vaccine  07/18/2019  . Colon Cancer Screening  10/04/2019  . Mammogram  03/19/2020  . Flu Shot  Completed  . DEXA scan (bone density measurement)  Completed  .  Hepatitis C: One time screening is recommended by Center for Disease Control  (CDC) for  adults born from [birth year] through 1967.   Completed  . Pneumonia vaccines  Completed

## 2019-10-20 ENCOUNTER — Other Ambulatory Visit: Payer: Self-pay | Admitting: Internal Medicine

## 2019-10-28 ENCOUNTER — Institutional Professional Consult (permissible substitution): Admit: 2019-10-28 | Discharge: 2019-10-29 | Payer: MEDICARE

## 2019-10-28 DIAGNOSIS — C3490 Malignant neoplasm of unspecified part of unspecified bronchus or lung: Secondary | ICD-10-CM | POA: Diagnosis not present

## 2019-10-28 DIAGNOSIS — Z515 Encounter for palliative care: Secondary | ICD-10-CM | POA: Diagnosis not present

## 2019-10-28 MED ORDER — ALCLOMETASONE 0.05 % TOPICAL CREAM
0 refills | 0 days | Status: CP
Start: 2019-10-28 — End: ?

## 2019-10-30 ENCOUNTER — Ambulatory Visit: Payer: Self-pay | Admitting: Internal Medicine

## 2019-11-05 ENCOUNTER — Telehealth: Admit: 2019-11-05 | Discharge: 2019-11-06 | Payer: MEDICARE

## 2019-11-06 ENCOUNTER — Other Ambulatory Visit: Payer: Self-pay

## 2019-11-06 ENCOUNTER — Ambulatory Visit (INDEPENDENT_AMBULATORY_CARE_PROVIDER_SITE_OTHER): Payer: Medicare Other | Admitting: Internal Medicine

## 2019-11-06 ENCOUNTER — Encounter: Payer: Self-pay | Admitting: Internal Medicine

## 2019-11-06 VITALS — BP 109/68 | HR 84 | Ht 62.0 in | Wt 144.5 lb

## 2019-11-06 DIAGNOSIS — E039 Hypothyroidism, unspecified: Secondary | ICD-10-CM

## 2019-11-06 DIAGNOSIS — Z1211 Encounter for screening for malignant neoplasm of colon: Secondary | ICD-10-CM | POA: Diagnosis not present

## 2019-11-06 DIAGNOSIS — F419 Anxiety disorder, unspecified: Secondary | ICD-10-CM | POA: Diagnosis not present

## 2019-11-06 DIAGNOSIS — C3492 Malignant neoplasm of unspecified part of left bronchus or lung: Secondary | ICD-10-CM

## 2019-11-06 DIAGNOSIS — K529 Noninfective gastroenteritis and colitis, unspecified: Secondary | ICD-10-CM | POA: Diagnosis not present

## 2019-11-06 DIAGNOSIS — F329 Major depressive disorder, single episode, unspecified: Secondary | ICD-10-CM

## 2019-11-06 DIAGNOSIS — R7303 Prediabetes: Secondary | ICD-10-CM

## 2019-11-06 DIAGNOSIS — L298 Other pruritus: Secondary | ICD-10-CM

## 2019-11-06 DIAGNOSIS — T50905A Adverse effect of unspecified drugs, medicaments and biological substances, initial encounter: Secondary | ICD-10-CM | POA: Diagnosis not present

## 2019-11-06 DIAGNOSIS — E7800 Pure hypercholesterolemia, unspecified: Secondary | ICD-10-CM

## 2019-11-06 MED ORDER — CLONAZEPAM 0.5 MG PO TABS: 0.5000 mg | ORAL_TABLET | Freq: Two times a day (BID) | ORAL | 5 refills | Status: DC | PRN

## 2019-11-06 NOTE — Progress Notes (Signed)
Virtual Visit via [email or web address]  This visit type was conducted due to national recommendations for restrictions regarding the COVID-19 pandemic (e.g. social distancing).  This format is felt to be most appropriate for this patient at this time.  All issues noted in this document were discussed and addressed.  No physical exam was performed (except for noted visual exam findings with Video Visits).   I connected with@ on 11/06/19 at  8:30 AM EST by a video enabled telemedicine application and verified that I am speaking with the correct person using two identifiers. Location patient: home Location provider: work or home office Persons participating in the virtual visit: patient, provider  I discussed the limitations, risks, security and privacy concerns of performing an evaluation and management service by telephone and the availability of in person appointments. I also discussed with the patient that there may be a patient responsible charge related to this service. The patient expressed understanding and agreed to proceed.   Reason for visit: follow up   HPI:  69 yr old female with stage IV lung cancer, hypertension, and depression presents for follow up on chronic issues .    WE reviewed her last several months of oncology follow up including her last CT which was stable. She has developed recurrent pruritic rash that is considered to be a side effect of her treatment .  Unfortunately her symptoms are not controlled with the minocycline and hydroxyzine  she also has persistent low grade nausea.  She has not had a trial of 2nd generation antihistamine and famotidine   Her perirectal abscess has resolved .    The patient has no signs or symptoms of COVID 19 infection (fever, cough, sore throat  or shortness of breath beyond what is typical for patient).  Patient denies contact with other persons with the above mentioned symptoms or with anyone confirmed to have COVID 19   Her symptoms of  depression/anxitey have improved with change in therapy from celexa to wellbutrin .  She is using clonazepam to initiate sleep   ROS: See pertinent positives and negatives per HPI.  Past Medical History:  Diagnosis Date  . Allergy    seasonal  . Carpal tunnel syndrome   . Degenerative joint disease   . Depression   . Endometriosis 1989   small amount  . Glaucoma    left eye, Dr. Morrison Old in [place]  . HSV infection   . Hypertension   . Osteoporosis    osteopenia, Fosamax then Boniva 2009  . Vitamin D deficiency    on supplements    Past Surgical History:  Procedure Laterality Date  . APPENDECTOMY  1964   open  . CARPAL TUNNEL RELEASE Right 01/2014   Dr. Malvin Johns  . FOOT NEUROMA SURGERY  May 2016   Left foot  . LAPAROSCOPY  1999  . NASAL SINUS SURGERY  2012   Dr. Pryor Ochoa  . THROAT SURGERY  05/02/2018  . TONSILLECTOMY    . TUBAL LIGATION  1992    Family History  Problem Relation Age of Onset  . Osteoporosis Mother        secondary to steroids  . Rheum arthritis Mother   . Hypertension Mother   . COPD Mother   . Arthritis Mother   . Stroke Mother   . Cancer Father        prostate  . Depression Son   . Anxiety disorder Son   . ADD / ADHD Son   . ADD / ADHD Son   .  Hypertrophic cardiomyopathy Sister   . Schizophrenia Paternal Aunt   . Heart disease Maternal Grandmother   . COPD Maternal Grandfather   . Depression Maternal Uncle      SOCIAL HX:  reports that she quit smoking about 36 years ago. Her smoking use included cigarettes. She has a 16.00 pack-year smoking history. She has never used smokeless tobacco. She reports current alcohol use. She reports that she does not use drugs.   Current Outpatient Medications:  .  alclomethasone (ACLOVATE) 0.05 % cream, APPLY EXTERNALLY TO THE AFFECTED AREA TWICE DAILY, Disp: , Rfl:  .  azelastine (ASTELIN) 0.1 % nasal spray, PLACE 1 SPRAY INTO BOTH NOSTRILS AT BEDTIME AS NEEDED, Disp: 30 mL, Rfl: 6 .  Bacillus Coagulans-Inulin  (PROBIOTIC) 1-250 BILLION-MG CAPS, Take 1 capsule by mouth daily. , Disp: , Rfl:  .  buPROPion (WELLBUTRIN XL) 150 MG 24 hr tablet, Take 1 tablet (150 mg total) by mouth daily., Disp: 90 tablet, Rfl: 1 .  clonazePAM (KLONOPIN) 0.5 MG tablet, Take 1 tablet (0.5 mg total) by mouth 2 (two) times daily as needed for anxiety., Disp: 60 tablet, Rfl: 5 .  diphenoxylate-atropine (LOMOTIL) 2.5-0.025 MG tablet, TK 1 T PO QID PRF DH, Disp: , Rfl:  .  hydrOXYzine (ATARAX/VISTARIL) 25 MG tablet, Take 25 mg by mouth every 8 (eight) hours as needed. , Disp: , Rfl:  .  levothyroxine (SYNTHROID) 50 MCG tablet, TAKE 1 TABLET(50 MCG) BY MOUTH DAILY, Disp: 90 tablet, Rfl: 1 .  loperamide (IMODIUM) 2 MG capsule, Take 4 mg by mouth once. , Disp: , Rfl:  .  loratadine (CLARITIN) 10 MG tablet, Take 10 mg by mouth daily.  , Disp: , Rfl:  .  metoprolol succinate (TOPROL-XL) 25 MG 24 hr tablet, Take 3 tablets (75 mg total) by mouth daily. Take with or immediately following a meal., Disp: 270 tablet, Rfl: 3 .  minocycline (MINOCIN) 50 MG capsule, Take 50 mg by mouth 2 (two) times daily. , Disp: , Rfl:  .  Multiple Vitamin (MULTIVITAMIN) tablet, Take 1 tablet by mouth daily., Disp: , Rfl:  .  NERLYNX 40 MG tablet, , Disp: , Rfl:   EXAM:  VITALS per patient if applicable:  GENERAL: alert, oriented, appears well and in no acute distress  HEENT: atraumatic, conjunttiva clear, no obvious abnormalities on inspection of external nose and ears  NECK: normal movements of the head and neck  LUNGS: on inspection no signs of respiratory distress, breathing rate appears normal, no obvious gross SOB, gasping or wheezing  CV: no obvious cyanosis  MS: moves all visible extremities without noticeable abnormality  PSYCH/NEURO: pleasant and cooperative, no obvious depression or anxiety, speech and thought processing grossly intact  ASSESSMENT AND PLAN:  Discussed the following assessment and plan:  Acquired hypothyroidism -  Plan: TSH  Chronic diarrhea - Plan: Magnesium, Comprehensive metabolic panel, Lipid panel  Prediabetes - Plan: Hemoglobin A1c  Pure hypercholesterolemia - Plan: Lipid panel  Adenocarcinoma of lung, stage 4, left (HCC)  Colon cancer screening  Drug-induced pruritus  Anxiety and depression  Adenocarcinoma of lung, stage 4, left (HCC)  s/p left pneumonectomy  With recurrence in the right lung.  She is currently asymptomatic    Colon cancer screening Discussed the risks and benefits of colonoscopy vs cologuard.  She is at average risk for colon CA.  cologuard recommended.   Drug-induced pruritus Recommending trial of claritin qid and famotidine bid.   Anxiety and depression Improved with  wellbutrin at the  150 mg XL dose.  No changes today     I discussed the assessment and treatment plan with the patient. The patient was provided an opportunity to ask questions and all were answered. The patient agreed with the plan and demonstrated an understanding of the instructions.   The patient was advised to call back or seek an in-person evaluation if the symptoms worsen or if the condition fails to improve as anticipated.   I provided  30 minutes of non-face-to-face time during this encounter reviewing patient's current problems and past procedures/imaging studies, providing counseling on the above mentioned problems , and coordination  of care .  Crecencio Mc, MD

## 2019-11-06 NOTE — Patient Instructions (Signed)
For the itching:  Increase the claritin to 4 times daily and add famotidine 20  Mg two times daily   Repeat labs after 6 weeks of thyroid medication

## 2019-11-06 NOTE — Progress Notes (Signed)
Refills:  Clonazepam

## 2019-11-07 DIAGNOSIS — L298 Other pruritus: Secondary | ICD-10-CM | POA: Insufficient documentation

## 2019-11-07 NOTE — Assessment & Plan Note (Signed)
Discussed the risks and benefits of colonoscopy vs cologuard.  She is at average risk for colon CA.  cologuard recommended.

## 2019-11-07 NOTE — Assessment & Plan Note (Signed)
Recommending trial of claritin qid and famotidine bid.

## 2019-11-07 NOTE — Assessment & Plan Note (Signed)
Improved with  wellbutrin at the 150 mg XL dose.  No changes today

## 2019-11-07 NOTE — Assessment & Plan Note (Signed)
s/p left pneumonectomy  With recurrence in the right lung.  She is currently asymptomatic

## 2019-11-11 ENCOUNTER — Ambulatory Visit: Admit: 2019-11-11 | Discharge: 2019-11-12 | Payer: MEDICARE

## 2019-11-11 DIAGNOSIS — Z23 Encounter for immunization: Secondary | ICD-10-CM | POA: Diagnosis not present

## 2019-11-16 MED ORDER — LOPERAMIDE 2 MG CAPSULE
ORAL_CAPSULE | 0 refills | 0 days | Status: CP
Start: 2019-11-16 — End: ?

## 2019-11-20 ENCOUNTER — Institutional Professional Consult (permissible substitution): Admit: 2019-11-20 | Discharge: 2019-11-21 | Payer: MEDICARE

## 2019-11-20 DIAGNOSIS — Z515 Encounter for palliative care: Secondary | ICD-10-CM | POA: Diagnosis not present

## 2019-11-29 DIAGNOSIS — F32A Depression, unspecified: Secondary | ICD-10-CM

## 2019-11-29 DIAGNOSIS — F329 Major depressive disorder, single episode, unspecified: Secondary | ICD-10-CM

## 2019-11-29 DIAGNOSIS — F419 Anxiety disorder, unspecified: Secondary | ICD-10-CM

## 2019-11-30 MED ORDER — BUPROPION HCL ER (XL) 150 MG PO TB24: 150.0000 mg | ORAL_TABLET | Freq: Every day | ORAL | 1 refills | Status: DC

## 2019-11-30 NOTE — Telephone Encounter (Signed)
I have refilled the medication.  

## 2019-12-02 ENCOUNTER — Telehealth: Admit: 2019-12-02 | Discharge: 2019-12-03 | Payer: MEDICARE

## 2019-12-09 ENCOUNTER — Institutional Professional Consult (permissible substitution): Admit: 2019-12-09 | Discharge: 2019-12-10 | Payer: MEDICARE

## 2019-12-09 ENCOUNTER — Ambulatory Visit: Admit: 2019-12-09 | Discharge: 2019-12-10 | Payer: MEDICARE

## 2019-12-09 DIAGNOSIS — C3490 Malignant neoplasm of unspecified part of unspecified bronchus or lung: Secondary | ICD-10-CM | POA: Diagnosis not present

## 2019-12-09 DIAGNOSIS — Z23 Encounter for immunization: Secondary | ICD-10-CM | POA: Diagnosis not present

## 2019-12-09 DIAGNOSIS — R197 Diarrhea, unspecified: Secondary | ICD-10-CM | POA: Diagnosis not present

## 2019-12-09 DIAGNOSIS — Z515 Encounter for palliative care: Secondary | ICD-10-CM | POA: Diagnosis not present

## 2020-01-05 ENCOUNTER — Ambulatory Visit
Admit: 2020-01-05 | Discharge: 2020-01-05 | Payer: MEDICARE | Attending: Hematology & Oncology | Primary: Hematology & Oncology

## 2020-01-05 ENCOUNTER — Ambulatory Visit: Admit: 2020-01-05 | Discharge: 2020-01-05 | Payer: MEDICARE

## 2020-01-05 DIAGNOSIS — C388 Malignant neoplasm of overlapping sites of heart, mediastinum and pleura: Principal | ICD-10-CM

## 2020-01-05 DIAGNOSIS — M1990 Unspecified osteoarthritis, unspecified site: Secondary | ICD-10-CM | POA: Diagnosis not present

## 2020-01-05 DIAGNOSIS — I10 Essential (primary) hypertension: Secondary | ICD-10-CM | POA: Diagnosis not present

## 2020-01-05 DIAGNOSIS — Z79899 Other long term (current) drug therapy: Secondary | ICD-10-CM | POA: Diagnosis not present

## 2020-01-05 DIAGNOSIS — F329 Major depressive disorder, single episode, unspecified: Secondary | ICD-10-CM | POA: Diagnosis not present

## 2020-01-05 DIAGNOSIS — R918 Other nonspecific abnormal finding of lung field: Secondary | ICD-10-CM | POA: Diagnosis not present

## 2020-01-05 DIAGNOSIS — C3432 Malignant neoplasm of lower lobe, left bronchus or lung: Secondary | ICD-10-CM | POA: Diagnosis not present

## 2020-01-05 DIAGNOSIS — H409 Unspecified glaucoma: Secondary | ICD-10-CM | POA: Diagnosis not present

## 2020-01-05 DIAGNOSIS — L270 Generalized skin eruption due to drugs and medicaments taken internally: Secondary | ICD-10-CM | POA: Diagnosis not present

## 2020-01-05 DIAGNOSIS — E039 Hypothyroidism, unspecified: Secondary | ICD-10-CM | POA: Diagnosis not present

## 2020-01-05 DIAGNOSIS — C3490 Malignant neoplasm of unspecified part of unspecified bronchus or lung: Secondary | ICD-10-CM | POA: Diagnosis not present

## 2020-01-05 DIAGNOSIS — Z902 Acquired absence of lung [part of]: Secondary | ICD-10-CM | POA: Diagnosis not present

## 2020-01-05 DIAGNOSIS — Z515 Encounter for palliative care: Secondary | ICD-10-CM | POA: Diagnosis not present

## 2020-01-05 DIAGNOSIS — R197 Diarrhea, unspecified: Secondary | ICD-10-CM | POA: Diagnosis not present

## 2020-01-05 DIAGNOSIS — Z885 Allergy status to narcotic agent status: Secondary | ICD-10-CM | POA: Diagnosis not present

## 2020-01-05 DIAGNOSIS — Z87891 Personal history of nicotine dependence: Secondary | ICD-10-CM | POA: Diagnosis not present

## 2020-01-05 DIAGNOSIS — N179 Acute kidney failure, unspecified: Secondary | ICD-10-CM | POA: Diagnosis not present

## 2020-01-05 DIAGNOSIS — Z888 Allergy status to other drugs, medicaments and biological substances status: Secondary | ICD-10-CM | POA: Diagnosis not present

## 2020-01-05 DIAGNOSIS — T451X5D Adverse effect of antineoplastic and immunosuppressive drugs, subsequent encounter: Secondary | ICD-10-CM | POA: Diagnosis not present

## 2020-01-05 MED ORDER — LOPERAMIDE 2 MG CAPSULE
ORAL_CAPSULE | 24 refills | 0 days | Status: CP
Start: 2020-01-05 — End: ?

## 2020-01-07 ENCOUNTER — Telehealth: Payer: Self-pay | Admitting: Internal Medicine

## 2020-01-07 DIAGNOSIS — C3490 Malignant neoplasm of unspecified part of unspecified bronchus or lung: Secondary | ICD-10-CM | POA: Diagnosis not present

## 2020-01-12 DIAGNOSIS — R7989 Other specified abnormal findings of blood chemistry: Principal | ICD-10-CM

## 2020-01-13 ENCOUNTER — Institutional Professional Consult (permissible substitution): Admit: 2020-01-13 | Discharge: 2020-01-14 | Payer: MEDICARE | Attending: Registered" | Primary: Registered"

## 2020-01-21 DIAGNOSIS — C3490 Malignant neoplasm of unspecified part of unspecified bronchus or lung: Principal | ICD-10-CM

## 2020-01-22 MED ORDER — MINOCYCLINE 50 MG CAPSULE
ORAL_CAPSULE | 2 refills | 0 days | Status: CP
Start: 2020-01-22 — End: ?

## 2020-01-26 ENCOUNTER — Other Ambulatory Visit (INDEPENDENT_AMBULATORY_CARE_PROVIDER_SITE_OTHER): Payer: Medicare Other

## 2020-01-26 ENCOUNTER — Other Ambulatory Visit: Payer: Self-pay

## 2020-01-26 DIAGNOSIS — R944 Abnormal results of kidney function studies: Secondary | ICD-10-CM | POA: Diagnosis not present

## 2020-01-26 DIAGNOSIS — E039 Hypothyroidism, unspecified: Secondary | ICD-10-CM | POA: Diagnosis not present

## 2020-01-26 DIAGNOSIS — K529 Noninfective gastroenteritis and colitis, unspecified: Secondary | ICD-10-CM

## 2020-01-26 DIAGNOSIS — E7800 Pure hypercholesterolemia, unspecified: Secondary | ICD-10-CM | POA: Diagnosis not present

## 2020-01-26 DIAGNOSIS — R7303 Prediabetes: Secondary | ICD-10-CM | POA: Diagnosis not present

## 2020-01-26 LAB — COMPREHENSIVE METABOLIC PANEL
ALT: 12 U/L (ref 0–35)
AST: 16 U/L (ref 0–37)
Albumin: 3 g/dL — ABNORMAL LOW (ref 3.5–5.2)
Alkaline Phosphatase: 85 U/L (ref 39–117)
BUN: 26 mg/dL — ABNORMAL HIGH (ref 6–23)
CO2: 25 mEq/L (ref 19–32)
Calcium: 8.6 mg/dL (ref 8.4–10.5)
Chloride: 111 mEq/L (ref 96–112)
Creatinine, Ser: 1.38 mg/dL — ABNORMAL HIGH (ref 0.40–1.20)
GFR: 37.69 mL/min — ABNORMAL LOW (ref >60.00)
Glucose, Bld: 82 mg/dL (ref 70–99)
Potassium: 3.8 mEq/L (ref 3.5–5.1)
Sodium: 141 mEq/L (ref 135–145)
Total Bilirubin: 0.3 mg/dL (ref 0.2–1.2)
Total Protein: 5 g/dL — ABNORMAL LOW (ref 6.0–8.3)

## 2020-01-26 LAB — LIPID PANEL
Cholesterol: 143 mg/dL (ref 0–200)
HDL: 48.1 mg/dL (ref >39.00)
LDL Cholesterol: 68 mg/dL (ref 0–99)
NonHDL: 94.71
Total CHOL/HDL Ratio: 3
Triglycerides: 132 mg/dL (ref 0.0–149.0)
VLDL: 26.4 mg/dL (ref 0.0–40.0)

## 2020-01-26 LAB — RENAL FUNCTION PANEL
Albumin: 3 g/dL — ABNORMAL LOW (ref 3.5–5.2)
BUN: 26 mg/dL — ABNORMAL HIGH (ref 6–23)
CO2: 25 mEq/L (ref 19–32)
Calcium: 8.6 mg/dL (ref 8.4–10.5)
Chloride: 111 mEq/L (ref 96–112)
Creatinine, Ser: 1.38 mg/dL — ABNORMAL HIGH (ref 0.40–1.20)
GFR: 37.69 mL/min — ABNORMAL LOW (ref >60.00)
Glucose, Bld: 82 mg/dL (ref 70–99)
Phosphorus: 3.9 mg/dL (ref 2.3–4.6)
Potassium: 3.8 mEq/L (ref 3.5–5.1)
Sodium: 141 mEq/L (ref 135–145)

## 2020-01-26 LAB — HEMOGLOBIN A1C: Hgb A1c MFr Bld: 5.2 % (ref 4.6–6.5)

## 2020-01-26 LAB — MAGNESIUM: Magnesium: 1.6 mg/dL (ref 1.5–2.5)

## 2020-01-26 LAB — TSH: TSH: 6.88 u[IU]/mL — ABNORMAL HIGH (ref 0.35–4.50)

## 2020-02-02 ENCOUNTER — Institutional Professional Consult (permissible substitution): Admit: 2020-02-02 | Discharge: 2020-02-03 | Payer: MEDICARE

## 2020-02-02 DIAGNOSIS — Z515 Encounter for palliative care: Secondary | ICD-10-CM | POA: Diagnosis not present

## 2020-02-02 DIAGNOSIS — R110 Nausea: Secondary | ICD-10-CM | POA: Diagnosis not present

## 2020-02-02 DIAGNOSIS — C3490 Malignant neoplasm of unspecified part of unspecified bronchus or lung: Secondary | ICD-10-CM | POA: Diagnosis not present

## 2020-02-02 DIAGNOSIS — R197 Diarrhea, unspecified: Secondary | ICD-10-CM | POA: Diagnosis not present

## 2020-02-02 MED ORDER — PROCHLORPERAZINE MALEATE 5 MG TABLET
ORAL_TABLET | Freq: Four times a day (QID) | ORAL | 1 refills | 8 days | Status: CP | PRN
Start: 2020-02-02 — End: ?

## 2020-02-03 ENCOUNTER — Telehealth: Admit: 2020-02-03 | Discharge: 2020-02-04 | Payer: MEDICARE

## 2020-02-04 ENCOUNTER — Other Ambulatory Visit: Payer: Self-pay

## 2020-02-04 ENCOUNTER — Ambulatory Visit (INDEPENDENT_AMBULATORY_CARE_PROVIDER_SITE_OTHER): Payer: Medicare Other

## 2020-02-04 DIAGNOSIS — Z23 Encounter for immunization: Secondary | ICD-10-CM

## 2020-02-04 NOTE — Progress Notes (Signed)
Patient presented for TDAP injection to left deltoid, patient voiced no concerns nor showed any signs of distress during injection.

## 2020-02-05 ENCOUNTER — Other Ambulatory Visit: Payer: Self-pay

## 2020-02-05 ENCOUNTER — Encounter: Payer: Self-pay | Admitting: Internal Medicine

## 2020-02-05 ENCOUNTER — Ambulatory Visit (INDEPENDENT_AMBULATORY_CARE_PROVIDER_SITE_OTHER): Payer: Medicare Other | Admitting: Internal Medicine

## 2020-02-05 VITALS — BP 120/82 | HR 85 | Temp 96.6°F | Resp 15 | Ht 62.0 in | Wt 145.2 lb

## 2020-02-05 DIAGNOSIS — R110 Nausea: Secondary | ICD-10-CM | POA: Diagnosis not present

## 2020-02-05 DIAGNOSIS — E039 Hypothyroidism, unspecified: Secondary | ICD-10-CM

## 2020-02-05 DIAGNOSIS — C3492 Malignant neoplasm of unspecified part of left bronchus or lung: Secondary | ICD-10-CM | POA: Diagnosis not present

## 2020-02-05 DIAGNOSIS — F329 Major depressive disorder, single episode, unspecified: Secondary | ICD-10-CM | POA: Diagnosis not present

## 2020-02-05 DIAGNOSIS — R000 Tachycardia, unspecified: Secondary | ICD-10-CM | POA: Diagnosis not present

## 2020-02-05 DIAGNOSIS — T451X5A Adverse effect of antineoplastic and immunosuppressive drugs, initial encounter: Secondary | ICD-10-CM | POA: Diagnosis not present

## 2020-02-05 DIAGNOSIS — F419 Anxiety disorder, unspecified: Secondary | ICD-10-CM | POA: Diagnosis not present

## 2020-02-05 DIAGNOSIS — Z1211 Encounter for screening for malignant neoplasm of colon: Secondary | ICD-10-CM | POA: Diagnosis not present

## 2020-02-05 MED ORDER — LEVOTHYROXINE SODIUM 75 MCG PO TABS: 75.0000 ug | ORAL_TABLET | Freq: Every day | ORAL | 0 refills | Status: DC

## 2020-02-05 NOTE — Patient Instructions (Signed)
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

## 2020-02-05 NOTE — Progress Notes (Signed)
Subjective:  Patient ID: CHANIYA GENTER, female    DOB: Sep 01, 1951  Age: 69 y.o. MRN: [id number]  CC: The primary encounter diagnosis was Acquired hypothyroidism. Diagnoses of Adenocarcinoma of lung, stage 4, left ([place]), Anxiety and depression, Colon cancer screening, Tachycardia, and Chemotherapy-induced nausea were also pertinent to this visit.  HPI LILIYANA THOBE presents for follow up on hypertension,  CKD stage 3 , hypothyroidism. And weight  loss due to  Chronic nausea caused by the chemotherapy she is taking for prevention of spread of her  lung CA  Weight loss:  Due to nausea . Her nausea is episodic, Comes in waves,  Worse after eating  Gets really bad every 2 weeks or so.  Has tried compazine, promethazine and zofran with limited success.  Not on PPI due ot medication interactions.  Has not tried famotidine or mylanta prophylactically before eating. Has lost 30 lbs since Jan 2020 . BMI 26  Hypothyroid; by recent lab. Taking medication as directed  Hypertension: patient checks blood pressure twice weekly at home.  Readings have been for the most part < 130/80 at rest . Patient is following a reduced salt diet most days and is taking medications as prescribed    Outpatient Medications Prior to Visit  Medication Sig Dispense Refill  . alclomethasone (ACLOVATE) 0.05 % cream APPLY EXTERNALLY TO THE AFFECTED AREA TWICE DAILY    . azelastine (ASTELIN) 0.1 % nasal spray PLACE 1 SPRAY INTO BOTH NOSTRILS AT BEDTIME AS NEEDED 30 mL 6  . Bacillus Coagulans-Inulin (PROBIOTIC) 1-250 BILLION-MG CAPS Take 1 capsule by mouth daily.     [name] buPROPion (WELLBUTRIN XL) 150 MG 24 hr tablet Take 1 tablet (150 mg total) by mouth daily. 90 tablet 1  . clonazePAM (KLONOPIN) 0.5 MG tablet Take 1 tablet (0.5 mg total) by mouth 2 (two) times daily as needed for anxiety. 60 tablet 5  . diphenoxylate-atropine (LOMOTIL) 2.5-0.025 MG tablet TK 1 T PO QID PRF DH    . hydrOXYzine (ATARAX/VISTARIL) 25 MG tablet Take 25  mg by mouth every 8 (eight) hours as needed.     . loperamide (IMODIUM) 2 MG capsule Take 4 mg by mouth once.     . loratadine (CLARITIN) 10 MG tablet Take 10 mg by mouth daily.      . metoprolol succinate (TOPROL-XL) 25 MG 24 hr tablet Take 3 tablets (75 mg total) by mouth daily. Take with or immediately following a meal. 270 tablet 3  . minocycline (MINOCIN) 50 MG capsule Take 50 mg by mouth 2 (two) times daily.     . Multiple Vitamin (MULTIVITAMIN) tablet Take 1 tablet by mouth daily.    . NERLYNX 40 MG tablet     . levothyroxine (SYNTHROID) 50 MCG tablet TAKE 1 TABLET(50 MCG) BY MOUTH DAILY 90 tablet 1  . prochlorperazine (COMPAZINE) 5 MG tablet      No facility-administered medications prior to visit.    Review of Systems;  Patient denies headache, fevers, malaise, unintentional weight loss, skin rash, eye pain, sinus congestion and sinus pain, sore throat, dysphagia,  hemoptysis , cough, dyspnea, wheezing, chest pain, palpitations, orthopnea, edema, abdominal pain, nausea, melena, diarrhea, constipation, flank pain, dysuria, hematuria, urinary  Frequency, nocturia, numbness, tingling, seizures,  Focal weakness, Loss of consciousness,  Tremor, insomnia, depression, anxiety, and suicidal ideation.      Objective:  BP 120/82 (BP Location: Left Arm, Patient Position: Sitting, Cuff Size: Normal)   Pulse 85   Temp (!) 96.6 F (  35.9 C) (Temporal)   Resp 15   Ht 5\' 2"  (1.575 m)   Wt 145 lb 3.2 oz (65.9 kg)   SpO2 99%   BMI 26.56 kg/m   BP Readings from Last 3 Encounters:  02/05/20 120/82  11/06/19 109/68  10/13/18 (!) 144/88    Wt Readings from Last 3 Encounters:  02/05/20 145 lb 3.2 oz (65.9 kg)  11/06/19 144 lb 8 oz (65.5 kg)  10/16/19 150 lb (68 kg)    General appearance: alert, cooperative and appears stated age Ears: normal TM's and external ear canals both ears Throat: lips, mucosa, and tongue normal; teeth and gums normal Neck: no adenopathy, no carotid bruit,  supple, symmetrical, trachea midline and thyroid not enlarged, symmetric, no tenderness/mass/nodules Back: symmetric, no curvature. ROM normal. No CVA tenderness. Lungs: clear to auscultation bilaterally Heart: regular rate and rhythm, S1, S2 normal, no murmur, click, rub or gallop Abdomen: soft, non-tender; bowel sounds normal; no masses,  no organomegaly Pulses: 2+ and symmetric Skin: Skin color, texture, turgor normal. No rashes or lesions Lymph nodes: Cervical, supraclavicular, and axillary nodes normal.  Lab Results  Component Value Date   HGBA1C 5.2 01/26/2020   HGBA1C 5.7 05/28/2019   HGBA1C 5.7 09/09/2018    Lab Results  Component Value Date   CREATININE 1.38 (H) 01/26/2020   CREATININE 1.38 (H) 01/26/2020   CREATININE 1.31 (H) 05/28/2019    Lab Results  Component Value Date   WBC 7.1 05/28/2019   HGB 12.6 05/28/2019   HCT 36.7 05/28/2019   PLT 316.0 05/28/2019   GLUCOSE 82 01/26/2020   GLUCOSE 82 01/26/2020   CHOL 143 01/26/2020   TRIG 132.0 01/26/2020   HDL 48.10 01/26/2020   LDLDIRECT 91.0 11/05/2016   LDLCALC 68 01/26/2020   ALT 12 01/26/2020   AST 16 01/26/2020   NA 141 01/26/2020   NA 141 01/26/2020   K 3.8 01/26/2020   K 3.8 01/26/2020   CL 111 01/26/2020   CL 111 01/26/2020   CREATININE 1.38 (H) 01/26/2020   CREATININE 1.38 (H) 01/26/2020   BUN 26 (H) 01/26/2020   BUN 26 (H) 01/26/2020   CO2 25 01/26/2020   CO2 25 01/26/2020   TSH 6.88 (H) 01/26/2020   HGBA1C 5.2 01/26/2020   MICROALBUR 3.4 (H) 09/09/2018    DG Bone Density  Result Date: 01/08/2017 EXAM: DUAL X-RAY ABSORPTIOMETRY (DXA) FOR BONE MINERAL DENSITY IMPRESSION: Dear Dr. Derrel Nip, Your patient Kristen Fromm completed a BMD test on 01/08/2017 using the [place] (analysis version: 14.10) manufactured by [employer]. The following summarizes the results of our evaluation. PATIENT BIOGRAPHICAL: Name: Caty, Tessler Patient ID: [id number] Birth Date: 01/04/1951 Height: 62.0 in.  Gender: Female Exam Date: 01/08/2017 Weight: 176.0 lbs. Indications: Caucasian, Family Hx of Osteoporosis, lung cancer, Postmenopausal, Previous Chemo and Radiation Fractures: Treatments: claritin, LEVOTHYROXINE, Vitamin D ASSESSMENT: The BMD measured at Femur Neck Left is 0.826 g/cm2 with a T-score of -1.5. This patient is considered osteopenic according to [facility]) criteria. Site Region Measured Measured WHO Young Adult BMD Date       Age      Classification T-score AP Spine L1-L4 01/08/2017 67.9 Normal -0.1 1.177 g/cm2 AP Spine L1-L4 01/19/2013 64.0 Normal -0.1 1.180 g/cm2 AP Spine L1-L4 01/24/2010 61.0 Normal -0.3 1.158 g/cm2 AP Spine L1-L4 01/24/2010 61.0 Normal -0.3 1.158 g/cm2 DualFemur Neck Left 01/08/2017 67.9 Osteopenia -1.5 0.826 g/cm2 DualFemur Neck Left 01/19/2013 64.0 Osteopenia -1.2 0.870 g/cm2 DualFemur Neck Left 01/24/2010  61.0 Normal -1.0 0.904 g/cm2 DualFemur Neck Left 01/24/2010 61.0 Normal -1.0 0.904 g/cm2 World Health Organization [facility]) criteria for post-menopausal, Caucasian Women: Normal:       T-score at or above -1 SD Osteopenia:   T-score between -1 and -2.5 SD Osteoporosis: T-score at or below -2.5 SD RECOMMENDATIONS: [place] recommends that FDA-approved medical therapies be considered in postmenopausal women and men age 60 or older with a: 1. Hip or vertebral (clinical or morphometric) fracture. 2. T-score of < -2.5 at the spine or hip. 3. Ten-year fracture probability by FRAX of 3% or greater for hip fracture or 20% or greater for major osteoporotic fracture. All treatment decisions require clinical judgment and consideration of individual patient factors, including patient preferences, co-morbidities, previous drug use, risk factors not captured in the FRAX model (e.g. falls, vitamin D deficiency, increased bone turnover, interval significant decline in bone density) and possible under - or over-estimation of fracture risk by FRAX. All  patients should ensure an adequate intake of dietary calcium (1200 mg/d) and vitamin D (800 IU daily) unless contraindicated. FOLLOW-UP: People with diagnosed cases of osteoporosis or at high risk for fracture should have regular bone mineral density tests. For patients eligible for Medicare, routine testing is allowed once every 2 years. The testing frequency can be increased to one year for patients who have rapidly progressing disease, those who are receiving or discontinuing medical therapy to restore bone mass, or have additional risk factors. I have reviewed this report, and agree with the above findings. [facility] Radiology Dear Dr. Derrel Nip, Your patient ROSAMARIA DONN completed a FRAX assessment on 01/08/2017 using the [place] (analysis version: 14.10) manufactured by [employer]. The following summarizes the results of our evaluation. PATIENT BIOGRAPHICAL: Name: Njeri, Vicente Patient ID: [id number] Birth Date: 03/26/1951 Height:    62.0 in. Gender:     [age]    [age]       Weight:    176.0 lbs. Ethnicity:  White                            Exam Date: 01/08/2017 FRAX* RESULTS:  (version: 3.5) 10-year Probability of Fracture1 Major Osteoporotic Fracture2 Hip Fracture 9.2% 1.1% Population: [place] (Caucasian) Risk Factors: None Based on Femur (Left) Neck BMD 1 -The 10-year probability of fracture may be lower than reported if the patient has received treatment. 2 -Major Osteoporotic Fracture: Clinical Spine, Forearm, Hip or Shoulder *FRAX is a Materials engineer of the [employer] for Metabolic Bone Disease, a [place] (WHO) [facility]. ASSESSMENT: The probability of a major osteoporotic fracture is 9.2% within the next ten years. The probability of a hip fracture is 1.1% within the next ten years. . Electronically Signed   By: Dorise Bullion III M.D   On: 01/08/2017 14:46    Assessment & Plan:   Problem List Items Addressed  This Visit      Unprioritized   Adenocarcinoma of lung, stage 4, left ([place])     s/p left pneumonectomy  With recurrence in the right lung.  She is currently asymptomatic  And using off label breast ca drug (Nerlynx) with no progression of disease.       Relevant Medications   prochlorperazine (COMPAZINE) 5 MG tablet   Anxiety and depression    Improved with  wellbutrin at the 150 mg XL dose.  No changes today .  She has no fear of dying and appears to be at peace with her diagnosis       Chemotherapy-induced nausea    Secondary to use of Nerlynx.  Adding mylanta before lunch and famotidine 20 mg at bedtime.  Dosing schedule done to avoid interactions with nerlynx and levothyroxine.       Colon cancer screening    She was referred for screening but denied the procedure due to her lung CA       Hypothyroidism - Primary    TSH Is elevated,  Dose increase discussed to 75 mcg daily   Lab Results  Component Value Date   TSH 6.88 (H) 01/26/2020         Relevant Medications   levothyroxine (SYNTHROID) 75 MCG tablet   Other Relevant Orders   TSH   Tachycardia    Managed with beta blocker. Continue metoprolol, dose of 75 mg ,         I have changed Lannette Donath. [place]'s levothyroxine. I am also having her maintain her loratadine, multivitamin, azelastine, diphenoxylate-atropine, loperamide, Nerlynx, metoprolol succinate, alclomethasone, minocycline, Probiotic, hydrOXYzine, clonazePAM, buPROPion, and prochlorperazine.  Meds ordered this encounter  Medications  . levothyroxine (SYNTHROID) 75 MCG tablet    Sig: Take 1 tablet (75 mcg total) by mouth daily before breakfast.    Dispense:  90 tablet    Refill:  0    Medications Discontinued During This Encounter  Medication Reason  . levothyroxine (SYNTHROID) 50 MCG tablet    I provided  30 minutes of  face-to-face time during this encounter reviewing patient's current problems and past surgeries, labs and imaging studies,  providing counseling on the above mentioned problems , and coordination  of care .  Follow-up: No follow-ups on file.   Crecencio Mc, MD

## 2020-02-08 DIAGNOSIS — T451X5A Adverse effect of antineoplastic and immunosuppressive drugs, initial encounter: Secondary | ICD-10-CM | POA: Insufficient documentation

## 2020-02-08 NOTE — Assessment & Plan Note (Signed)
TSH Is elevated,  Dose increase discussed to 75 mcg daily   Lab Results  Component Value Date   TSH 6.88 (H) 01/26/2020

## 2020-02-08 NOTE — Assessment & Plan Note (Signed)
s/p left pneumonectomy  With recurrence in the right lung.  She is currently asymptomatic  And using off label breast ca drug (Nerlynx) with no progression of disease.

## 2020-02-08 NOTE — Assessment & Plan Note (Signed)
Secondary to use of Nerlynx.  Adding mylanta before lunch and famotidine 20 mg at bedtime.  Dosing schedule done to avoid interactions with nerlynx and levothyroxine.

## 2020-02-08 NOTE — Assessment & Plan Note (Signed)
Improved with  wellbutrin at the 150 mg XL dose.  No changes today .  She has no fear of dying and appears to be at peace with her diagnosis

## 2020-02-08 NOTE — Assessment & Plan Note (Signed)
Managed with beta blocker. Continue metoprolol, dose of 75 mg ,

## 2020-02-08 NOTE — Assessment & Plan Note (Signed)
She was referred for screening but denied the procedure due to her lung CA

## 2020-02-09 ENCOUNTER — Ambulatory Visit
Admit: 2020-02-09 | Discharge: 2020-02-10 | Payer: MEDICARE | Attending: Hematology & Oncology | Primary: Hematology & Oncology

## 2020-02-09 DIAGNOSIS — C3490 Malignant neoplasm of unspecified part of unspecified bronchus or lung: Principal | ICD-10-CM

## 2020-03-01 ENCOUNTER — Institutional Professional Consult (permissible substitution): Admit: 2020-03-01 | Discharge: 2020-03-02 | Payer: MEDICARE

## 2020-03-01 DIAGNOSIS — Z79899 Other long term (current) drug therapy: Secondary | ICD-10-CM | POA: Diagnosis not present

## 2020-03-01 DIAGNOSIS — Z515 Encounter for palliative care: Secondary | ICD-10-CM | POA: Diagnosis not present

## 2020-03-01 DIAGNOSIS — R600 Localized edema: Secondary | ICD-10-CM | POA: Diagnosis not present

## 2020-03-01 DIAGNOSIS — C3490 Malignant neoplasm of unspecified part of unspecified bronchus or lung: Secondary | ICD-10-CM | POA: Diagnosis not present

## 2020-03-01 DIAGNOSIS — R197 Diarrhea, unspecified: Secondary | ICD-10-CM | POA: Diagnosis not present

## 2020-03-03 ENCOUNTER — Ambulatory Visit: Admit: 2020-03-03 | Discharge: 2020-03-04 | Payer: MEDICARE

## 2020-03-03 DIAGNOSIS — C388 Malignant neoplasm of overlapping sites of heart, mediastinum and pleura: Principal | ICD-10-CM

## 2020-03-03 DIAGNOSIS — I272 Pulmonary hypertension, unspecified: Secondary | ICD-10-CM | POA: Diagnosis not present

## 2020-03-03 DIAGNOSIS — I071 Rheumatic tricuspid insufficiency: Secondary | ICD-10-CM | POA: Diagnosis not present

## 2020-03-03 DIAGNOSIS — C3490 Malignant neoplasm of unspecified part of unspecified bronchus or lung: Secondary | ICD-10-CM | POA: Diagnosis not present

## 2020-03-18 ENCOUNTER — Other Ambulatory Visit (INDEPENDENT_AMBULATORY_CARE_PROVIDER_SITE_OTHER): Payer: Medicare Other

## 2020-03-18 ENCOUNTER — Other Ambulatory Visit: Payer: Self-pay

## 2020-03-18 DIAGNOSIS — E039 Hypothyroidism, unspecified: Secondary | ICD-10-CM

## 2020-03-18 LAB — TSH: TSH: 1.16 u[IU]/mL (ref 0.35–4.50)

## 2020-04-01 ENCOUNTER — Ambulatory Visit: Admit: 2020-04-01 | Discharge: 2020-04-02 | Payer: MEDICARE

## 2020-04-01 DIAGNOSIS — N1831 Chronic kidney disease, stage 3a: Secondary | ICD-10-CM | POA: Diagnosis not present

## 2020-04-01 DIAGNOSIS — R7989 Other specified abnormal findings of blood chemistry: Secondary | ICD-10-CM | POA: Diagnosis not present

## 2020-04-05 ENCOUNTER — Ambulatory Visit: Admit: 2020-04-05 | Discharge: 2020-04-05 | Payer: MEDICARE

## 2020-04-05 ENCOUNTER — Ambulatory Visit
Admit: 2020-04-05 | Discharge: 2020-04-05 | Payer: MEDICARE | Attending: Hematology & Oncology | Primary: Hematology & Oncology

## 2020-04-05 DIAGNOSIS — C3490 Malignant neoplasm of unspecified part of unspecified bronchus or lung: Principal | ICD-10-CM

## 2020-04-05 MED ORDER — ALCLOMETASONE 0.05 % TOPICAL CREAM
Freq: Two times a day (BID) | TOPICAL | 12 refills | 0.00000 days | Status: CP
Start: 2020-04-05 — End: ?

## 2020-04-05 MED ORDER — DIPHENOXYLATE-ATROPINE 2.5 MG-0.025 MG TABLET
ORAL_TABLET | Freq: Every day | ORAL | 3 refills | 90.00000 days | Status: CP
Start: 2020-04-05 — End: 2020-07-04

## 2020-04-05 MED ORDER — MINOCYCLINE 50 MG CAPSULE
ORAL_CAPSULE | Freq: Two times a day (BID) | ORAL | 3 refills | 90 days | Status: CP
Start: 2020-04-05 — End: ?

## 2020-04-12 ENCOUNTER — Telehealth: Admit: 2020-04-12 | Discharge: 2020-04-13 | Payer: MEDICARE

## 2020-04-21 ENCOUNTER — Ambulatory Visit: Admit: 2020-04-21 | Discharge: 2020-04-22 | Payer: MEDICARE

## 2020-04-21 DIAGNOSIS — R7989 Other specified abnormal findings of blood chemistry: Secondary | ICD-10-CM | POA: Diagnosis not present

## 2020-04-21 DIAGNOSIS — N271 Small kidney, bilateral: Secondary | ICD-10-CM | POA: Diagnosis not present

## 2020-04-21 DIAGNOSIS — N1831 Chronic kidney disease, stage 3a: Secondary | ICD-10-CM | POA: Diagnosis not present

## 2020-04-21 DIAGNOSIS — K8020 Calculus of gallbladder without cholecystitis without obstruction: Secondary | ICD-10-CM | POA: Diagnosis not present

## 2020-04-21 DIAGNOSIS — N189 Chronic kidney disease, unspecified: Secondary | ICD-10-CM | POA: Diagnosis not present

## 2020-04-29 ENCOUNTER — Institutional Professional Consult (permissible substitution): Admit: 2020-04-29 | Discharge: 2020-04-30 | Payer: MEDICARE

## 2020-04-29 DIAGNOSIS — R197 Diarrhea, unspecified: Secondary | ICD-10-CM | POA: Diagnosis not present

## 2020-04-29 DIAGNOSIS — R110 Nausea: Secondary | ICD-10-CM | POA: Diagnosis not present

## 2020-04-29 DIAGNOSIS — Z515 Encounter for palliative care: Secondary | ICD-10-CM | POA: Diagnosis not present

## 2020-04-29 DIAGNOSIS — C3490 Malignant neoplasm of unspecified part of unspecified bronchus or lung: Secondary | ICD-10-CM | POA: Diagnosis not present

## 2020-04-29 MED ORDER — PROCHLORPERAZINE MALEATE 5 MG TABLET
ORAL_TABLET | Freq: Four times a day (QID) | ORAL | 1 refills | 15 days | Status: CP | PRN
Start: 2020-04-29 — End: ?

## 2020-05-06 ENCOUNTER — Other Ambulatory Visit: Payer: Self-pay | Admitting: Internal Medicine

## 2020-05-12 ENCOUNTER — Other Ambulatory Visit: Payer: Self-pay

## 2020-05-12 ENCOUNTER — Encounter: Payer: Self-pay | Admitting: Internal Medicine

## 2020-05-12 ENCOUNTER — Ambulatory Visit (INDEPENDENT_AMBULATORY_CARE_PROVIDER_SITE_OTHER): Payer: Medicare Other | Admitting: Internal Medicine

## 2020-05-12 VITALS — BP 128/72 | HR 75 | Temp 98.6°F | Resp 15 | Ht 62.0 in | Wt 139.8 lb

## 2020-05-12 DIAGNOSIS — F419 Anxiety disorder, unspecified: Secondary | ICD-10-CM | POA: Diagnosis not present

## 2020-05-12 DIAGNOSIS — E7800 Pure hypercholesterolemia, unspecified: Secondary | ICD-10-CM | POA: Diagnosis not present

## 2020-05-12 DIAGNOSIS — F329 Major depressive disorder, single episode, unspecified: Secondary | ICD-10-CM

## 2020-05-12 DIAGNOSIS — C3492 Malignant neoplasm of unspecified part of left bronchus or lung: Secondary | ICD-10-CM | POA: Diagnosis not present

## 2020-05-12 DIAGNOSIS — R7303 Prediabetes: Secondary | ICD-10-CM

## 2020-05-12 DIAGNOSIS — E039 Hypothyroidism, unspecified: Secondary | ICD-10-CM | POA: Diagnosis not present

## 2020-05-12 DIAGNOSIS — R634 Abnormal weight loss: Secondary | ICD-10-CM | POA: Diagnosis not present

## 2020-05-12 DIAGNOSIS — K8050 Calculus of bile duct without cholangitis or cholecystitis without obstruction: Secondary | ICD-10-CM

## 2020-05-12 DIAGNOSIS — I10 Essential (primary) hypertension: Secondary | ICD-10-CM | POA: Diagnosis not present

## 2020-05-12 DIAGNOSIS — N1832 Chronic kidney disease, stage 3b: Secondary | ICD-10-CM

## 2020-05-12 DIAGNOSIS — L609 Nail disorder, unspecified: Secondary | ICD-10-CM | POA: Diagnosis not present

## 2020-05-12 MED ORDER — BUPROPION HCL ER (XL) 150 MG PO TB24: 150.0000 mg | ORAL_TABLET | Freq: Every day | ORAL | 1 refills | Status: DC

## 2020-05-12 NOTE — Assessment & Plan Note (Addendum)
Etiology unclear.  May be side effect of chemoReferring to [facility]  For evaluation

## 2020-05-12 NOTE — Assessment & Plan Note (Signed)
She will return for fasting labs.  Still losing weight due to chemotherapy nausea

## 2020-05-12 NOTE — Patient Instructions (Signed)
You look great!   Beware of people with "allergy symptoms"   [facility] has a very proactive monoclonal antibody infusion protocol.  Let me know ASAP if you test positive  [facility] referral in progress

## 2020-05-12 NOTE — Progress Notes (Signed)
Subjective:  Patient ID: Stacy Santana, female    DOB: 02/21/1951  Age: 69 y.o. MRN: [id number]  CC: The primary encounter diagnosis was Pure hypercholesterolemia. Diagnoses of Anxiety and depression, Prediabetes, Acquired hypothyroidism, Fingernail abnormalities, Adenocarcinoma of lung, stage 4, left ([place]), Essential hypertension, Stage 3b chronic kidney disease, Choledocholithiasis, and Weight loss of more than 10% body weight were also pertinent to this visit.  HPI [name] presents for 3 month follow up on major depressive disorder,  Hypertension,  CKD stage 3A , hypothyroidism   This visit occurred during the SARS-CoV-2 public health emergency.  Safety protocols were in place, including screening questions prior to the visit, additional usage of staff PPE, and extensive cleaning of exam room while observing appropriate contact time as indicated for disinfecting solutions.    Patient has received both doses of the available COVID 19 vaccine without complications.  Patient continues to mask when outside of the home except when walking in yard or at safe distances from others .  Patient denies any change in mood or development of unhealthy behaviors resuting from the pandemic's restriction of activities and socialization.     Since last visit in May   CKD:  Seen by Nephrology.  U/S done  Small kidneys,  Incident 1.4 cm gallstone in neck of duct .   Lung CA s/p left pneumonectomy,  XRT and ongoing chemo:   CT chest done July 6:  Right lung nodules "marginal increase in size"  Without LAD.  Patient and oncologist not worried; attribute change in szie to reading style of new radiologist at [facility] psychiatry through Cancer center :  no changes to meds    Has developed a fingernail change:    3 fingers involved thus far.   Started with broadening of the white distal end of the of index finger nail tip,  Following by brown discoloration of the sides of the nails which she felt occurred  when she was filing her nails  The  Brown color progressed from tip to the cuticle .  Nail feels  little loose, not painful . Same cheange noticed earlhy in 2 other fingernails.   Needs referral to [facility] in [place]   Chem adverse effects:  Now Using lomotil to prevent nocturnal diarrhea from the chemo,  And compazine once daily for the nausea. Skips the drug once a week to reset GI system .  End of life issues:  Discussed at length.  She is at peace with her impending mortality and is focusing on living every day to the fullest with her beloved husband.  She has an excellent relationship with her children as well.  .    Outpatient Medications Prior to Visit  Medication Sig Dispense Refill  . alclomethasone (ACLOVATE) 0.05 % cream APPLY EXTERNALLY TO THE AFFECTED AREA TWICE DAILY    . azelastine (ASTELIN) 0.1 % nasal spray PLACE 1 SPRAY INTO BOTH NOSTRILS AT BEDTIME AS NEEDED 30 mL 6  . clonazePAM (KLONOPIN) 0.5 MG tablet Take 1 tablet (0.5 mg total) by mouth 2 (two) times daily as needed for anxiety. 60 tablet 5  . diphenoxylate-atropine (LOMOTIL) 2.5-0.025 MG tablet TK 1 T PO QID PRF DH    . levothyroxine (SYNTHROID) 75 MCG tablet TAKE 1 TABLET(75 MCG) BY MOUTH DAILY BEFORE BREAKFAST 90 tablet 0  . loperamide (IMODIUM) 2 MG capsule Take 4 mg by mouth once.     . loratadine (CLARITIN) 10 MG tablet Take  10 mg by mouth daily.      . metoprolol succinate (TOPROL-XL) 25 MG 24 hr tablet Take 3 tablets (75 mg total) by mouth daily. Take with or immediately following a meal. 270 tablet 3  . minocycline (MINOCIN) 50 MG capsule Take 50 mg by mouth 2 (two) times daily.     . Multiple Vitamin (MULTIVITAMIN) tablet Take 1 tablet by mouth daily.    . NERLYNX 40 MG tablet     . prochlorperazine (COMPAZINE) 5 MG tablet     . buPROPion (WELLBUTRIN XL) 150 MG 24 hr tablet Take 1 tablet (150 mg total) by mouth daily. 90 tablet 1  . Bacillus Coagulans-Inulin (PROBIOTIC) 1-250 BILLION-MG  CAPS Take 1 capsule by mouth daily.  (Patient not taking: Reported on 05/12/2020)    . hydrOXYzine (ATARAX/VISTARIL) 25 MG tablet Take 25 mg by mouth every 8 (eight) hours as needed.  (Patient not taking: Reported on 05/12/2020)     No facility-administered medications prior to visit.    Review of Systems;  Patient denies headache, fevers, malaise, unintentional weight loss, skin rash, eye pain, sinus congestion and sinus pain, sore throat, dysphagia,  hemoptysis , cough, dyspnea, wheezing, chest pain, palpitations, orthopnea, edema, abdominal pain, nausea, melena,, constipation, flank pain, dysuria, hematuria, urinary  Frequency, nocturia, numbness, tingling, seizures,  Focal weakness, Loss of consciousness,  Tremor, insomnia, depression, anxiety, and suicidal ideation.      Objective:  BP 128/72 (BP Location: Left Arm, Patient Position: Sitting, Cuff Size: Normal)   Pulse 75   Temp 98.6 F (37 C) (Oral)   Resp 15   Ht 5' 2" (1.575 m)   Wt 139 lb 12.8 oz (63.4 kg)   SpO2 98%   BMI 25.57 kg/m   BP Readings from Last 3 Encounters:  05/12/20 128/72  02/05/20 120/82  11/06/19 109/68    Wt Readings from Last 3 Encounters:  05/12/20 139 lb 12.8 oz (63.4 kg)  02/05/20 145 lb 3.2 oz (65.9 kg)  11/06/19 144 lb 8 oz (65.5 kg)    General appearance: alert, cooperative and appears stated age Ears: normal TM's and external ear canals both ears Throat: lips, mucosa, and tongue normal; teeth and gums normal Neck: no adenopathy, no carotid bruit, supple, symmetrical, trachea midline and thyroid not enlarged, symmetric, no tenderness/mass/nodules Back: symmetric, no curvature. ROM normal. No CVA tenderness. Lungs: clear to auscultation bilaterally Heart: regular rate and rhythm, S1, S2 normal, no murmur, click, rub or gallop Abdomen: soft, non-tender; bowel sounds normal; no masses,  no organomegaly Pulses: 2+ and symmetric Skin: Skin color, texture, turgor normal. No rashes or  lesions Lymph nodes: Cervical, supraclavicular, and axillary nodes normal.  Lab Results  Component Value Date   HGBA1C 5.2 01/26/2020   HGBA1C 5.7 05/28/2019   HGBA1C 5.7 09/09/2018    Lab Results  Component Value Date   CREATININE 1.38 (H) 01/26/2020   CREATININE 1.38 (H) 01/26/2020   CREATININE 1.31 (H) 05/28/2019    Lab Results  Component Value Date   WBC 7.1 05/28/2019   HGB 12.6 05/28/2019   HCT 36.7 05/28/2019   PLT 316.0 05/28/2019   GLUCOSE 82 01/26/2020   GLUCOSE 82 01/26/2020   CHOL 143 01/26/2020   TRIG 132.0 01/26/2020   HDL 48.10 01/26/2020   LDLDIRECT 91.0 11/05/2016   LDLCALC 68 01/26/2020   ALT 12 01/26/2020   AST 16 01/26/2020   NA 141 01/26/2020   NA 141 01/26/2020   K 3.8 01/26/2020   K 3.8  01/26/2020   CL 111 01/26/2020   CL 111 01/26/2020   CREATININE 1.38 (H) 01/26/2020   CREATININE 1.38 (H) 01/26/2020   BUN 26 (H) 01/26/2020   BUN 26 (H) 01/26/2020   CO2 25 01/26/2020   CO2 25 01/26/2020   TSH 1.16 03/18/2020   HGBA1C 5.2 01/26/2020   MICROALBUR 3.4 (H) 09/09/2018    DG Bone Density  Result Date: 01/08/2017 EXAM: DUAL X-RAY ABSORPTIOMETRY (DXA) FOR BONE MINERAL DENSITY IMPRESSION: Dear Dr. Derrel Nip, Your patient Juel Ripley completed a BMD test on 01/08/2017 using the [employer] (analysis version: 14.10) manufactured by [employer]. The following summarizes the results of our evaluation. PATIENT BIOGRAPHICAL: Name: Lysandra, Loughmiller Patient ID: [id number] Birth Date: April 20, 1951 Height: 62.0 in. Gender: Female Exam Date: 01/08/2017 Weight: 176.0 lbs. Indications: Caucasian, Family Hx of Osteoporosis, lung cancer, Postmenopausal, Previous Chemo and Radiation Fractures: Treatments: claritin, LEVOTHYROXINE, Vitamin D ASSESSMENT: The BMD measured at Femur Neck Left is 0.826 g/cm2 with a T-score of -1.5. This patient is considered osteopenic according to [facility]) criteria. Site Region Measured Measured WHO Young Adult  BMD Date       Age      Classification T-score AP Spine L1-L4 01/08/[age] 67.9 Normal -0.1 1.177 g/cm2 AP Spine L1-L4 01/19/2013 64.0 Normal -0.1 1.180 g/cm2 AP Spine L1-L4 01/24/2010 61.0 Normal -0.3 1.158 g/cm2 AP Spine L1-L4 01/24/2010 61.0 Normal -0.3 1.158 g/cm2 DualFemur Neck Left 01/08/2017 67.9 Osteopenia -1.5 0.826 g/cm2 DualFemur Neck Left 01/19/2013 62.0 Osteopenia -1.2 0.870 g/cm2 DualFemur Neck Left 01/24/2010 61.0 Normal -1.0 0.904 g/cm2 DualFemur Neck Left 01/24/2010 61.0 Normal -1.0 0.904 g/cm2 World Health Organization [facility]) criteria for post-menopausal, Caucasian Women: Normal:       T-score at or above -1 SD Osteopenia:   T-score between -1 and -2.5 SD Osteoporosis: T-score at or below -2.5 SD RECOMMENDATIONS: [place] recommends that FDA-approved medical therapies be considered in postmenopausal women and men age 88 or older with a: 1. Hip or vertebral (clinical or morphometric) fracture. 2. T-score of < -2.5 at the spine or hip. 3. Ten-year fracture probability by FRAX of 3% or greater for hip fracture or 20% or greater for major osteoporotic fracture. All treatment decisions require clinical judgment and consideration of individual patient factors, including patient preferences, co-morbidities, previous drug use, risk factors not captured in the FRAX model (e.g. falls, vitamin D deficiency, increased bone turnover, interval significant decline in bone density) and possible under - or over-estimation of fracture risk by FRAX. All patients should ensure an adequate intake of dietary calcium (1200 mg/d) and vitamin D (800 IU daily) unless contraindicated. FOLLOW-UP: People with diagnosed cases of osteoporosis or at high risk for fracture should have regular bone mineral density tests. For patients eligible for Medicare, routine testing is allowed once every 2 years. The testing frequency can be increased to one year for patients who have rapidly progressing disease, those  who are receiving or discontinuing medical therapy to restore bone mass, or have additional risk factors. I have reviewed this report, and agree with the above findings. [facility] Dear Dr. Derrel Nip, Your patient ANIAYAH ALANIZ completed a FRAX assessment on 01/08/2017 using the [place] (analysis version: 14.10) manufactured by [employer]. The following summarizes the results of our evaluation. PATIENT BIOGRAPHICAL: Name: Tameisha, Covell Patient ID: [id number] Birth Date: Aug 10, 1951 Height:    62.0 in. Gender:     Female    [age]  [age]       Weight:    176.0 lbs. Ethnicity:  White                            Exam Date: 01/08/2017 FRAX* RESULTS:  (version: 3.5) 10-year Probability of Fracture1 Major Osteoporotic Fracture2 Hip Fracture 9.2% 1.1% Population: [place] (Caucasian) Risk Factors: None Based on Femur (Left) Neck BMD 1 -The 10-year probability of fracture may be lower than reported if the patient has received treatment. 2 -Major Osteoporotic Fracture: Clinical Spine, Forearm, Hip or Shoulder *FRAX is a Materials engineer of the [employer] for Metabolic Bone Disease, a [place] (WHO) [facility]. ASSESSMENT: The probability of a major osteoporotic fracture is 9.2% within the next ten years. The probability of a hip fracture is 1.1% within the next ten years. . Electronically Signed   By: Dorise Bullion III M.D   On: 01/08/2017 14:46    Assessment & Plan:   Problem List Items Addressed This Visit      Unprioritized   Adenocarcinoma of lung, stage 4, left ([place])     s/p left pneumonectomy  With recurrence in the right lung.  She is currently asymptomatic  And using off label breast ca drug (Nerlynx) with no progression of disease. [name] Next scan in October.  Marginal increase noted on July CT but may be a soft call      Anxiety and depression    Stable symptoms on wellbutrin .  No panic attacks.  sleeping well. Has good  support in place through the [place]       Relevant Medications   buPROPion (WELLBUTRIN XL) 150 MG 24 hr tablet   Hypothyroidism    Repeat thyroid level needed tolerating dose increase without side effects   Lab Results  Component Value Date   TSH 1.16 03/18/2020         Relevant Orders   TSH   RESOLVED: Prediabetes   Relevant Orders   Hemoglobin A1c   Comprehensive metabolic panel   Pure hypercholesterolemia - Primary    She will return for fasting labs.  Still losing weight due to chemotherapy nausea       Relevant Orders   Lipid panel   Choledocholithiasis    Asymptomatic incidental finding on renal u/s.  Advised to continue low fat diet  Lab Results  Component Value Date   ALT 12 01/26/2020   AST 16 01/26/2020   ALKPHOS 85 01/26/2020   BILITOT 0.3 01/26/2020         CKD (chronic kidney disease) stage 3, GFR 30-59 ml/min    Has not had screening for secondary PTH.  Vit D, PTH,  Phosphorus, alk phos added to labs to be drawn      Relevant Orders   Alkaline phosphatase, isoenzymes   Phosphorus   VITAMIN D 25 Hydroxy (Vit-D Deficiency, Fractures)   PTH, Intact and Calcium   Fingernail abnormalities    Etiology unclear.  May be side effect of [facility]  For evaluation       Relevant Orders   Ambulatory referral to Dermatology   Hypertension (Chronic)    Well controlled on current regimen. Renal function stable, no changes today.  Lab Results  Component Value Date   CREATININE 1.38 (H) 01/26/2020   CREATININE 1.38 (H) 01/26/2020   Lab Results  Component Value Date   NA 141 01/26/2020   NA 141  01/26/2020   K 3.8 01/26/2020   K 3.8 01/26/2020   CL 111 01/26/2020   CL 111 01/26/2020   CO2 25 01/26/2020   CO2 25 01/26/2020         Weight loss of more than 10% body weight    Secondary to nausea induced by chemotherapy for lung CA .         A total of 40 minutes was spent with patient more than half of which was  spent in counseling patient on the above mentioned issues , reviewing and explaining recent labs and imaging studies done, and coordination of care.   I have discontinued Lannette Donath. Guizar's Probiotic and hydrOXYzine. I am also having her maintain her loratadine, multivitamin, azelastine, diphenoxylate-atropine, loperamide, Nerlynx, metoprolol succinate, alclomethasone, minocycline, clonazePAM, prochlorperazine, levothyroxine, and buPROPion.  Meds ordered this encounter  Medications  . buPROPion (WELLBUTRIN XL) 150 MG 24 hr tablet    Sig: Take 1 tablet (150 mg total) by mouth daily.    Dispense:  90 tablet    Refill:  1    Medications Discontinued During This Encounter  Medication Reason  . hydrOXYzine (ATARAX/VISTARIL) 25 MG tablet   . Bacillus Coagulans-Inulin (PROBIOTIC) 1-250 BILLION-MG CAPS   . buPROPion (WELLBUTRIN XL) 150 MG 24 hr tablet Reorder    Follow-up: Return in about 3 months (around 08/12/2020).   Crecencio Mc, MD

## 2020-05-12 NOTE — Assessment & Plan Note (Signed)
Stable symptoms on wellbutrin .  No panic attacks.  sleeping well. Has good support in place through the [facility]

## 2020-05-12 NOTE — Assessment & Plan Note (Signed)
Repeat thyroid level needed tolerating dose increase without side effects   Lab Results  Component Value Date   TSH 1.16 03/18/2020

## 2020-05-12 NOTE — Assessment & Plan Note (Signed)
s/p left pneumonectomy  With recurrence in the right lung.  She is currently asymptomatic  And using off label breast ca drug (Nerlynx) with no progression of disease. [name] Next scan in October.  Marginal increase noted on July CT but may be a soft call

## 2020-05-14 DIAGNOSIS — R634 Abnormal weight loss: Secondary | ICD-10-CM | POA: Insufficient documentation

## 2020-05-14 DIAGNOSIS — N183 Chronic kidney disease, stage 3 unspecified: Secondary | ICD-10-CM | POA: Insufficient documentation

## 2020-05-14 DIAGNOSIS — K8050 Calculus of bile duct without cholangitis or cholecystitis without obstruction: Secondary | ICD-10-CM | POA: Insufficient documentation

## 2020-05-14 NOTE — Assessment & Plan Note (Signed)
Well controlled on current regimen. Renal function stable, no changes today.  Lab Results  Component Value Date   CREATININE 1.38 (H) 01/26/2020   CREATININE 1.38 (H) 01/26/2020   Lab Results  Component Value Date   NA 141 01/26/2020   NA 141 01/26/2020   K 3.8 01/26/2020   K 3.8 01/26/2020   CL 111 01/26/2020   CL 111 01/26/2020   CO2 25 01/26/2020   CO2 25 01/26/2020

## 2020-05-14 NOTE — Assessment & Plan Note (Addendum)
Has not had screening for secondary PTH.  Vit D, PTH,  Phosphorus, alk phos added to labs to be drawn

## 2020-05-14 NOTE — Assessment & Plan Note (Signed)
Secondary to nausea induced by chemotherapy for lung CA .

## 2020-05-14 NOTE — Assessment & Plan Note (Signed)
Asymptomatic incidental finding on renal u/s.  Advised to continue low fat diet  Lab Results  Component Value Date   ALT 12 01/26/2020   AST 16 01/26/2020   ALKPHOS 85 01/26/2020   BILITOT 0.3 01/26/2020

## 2020-05-20 DIAGNOSIS — L609 Nail disorder, unspecified: Principal | ICD-10-CM

## 2020-05-20 DIAGNOSIS — L989 Disorder of the skin and subcutaneous tissue, unspecified: Principal | ICD-10-CM

## 2020-06-01 ENCOUNTER — Other Ambulatory Visit: Payer: Self-pay

## 2020-06-01 ENCOUNTER — Other Ambulatory Visit (INDEPENDENT_AMBULATORY_CARE_PROVIDER_SITE_OTHER): Payer: Medicare Other

## 2020-06-01 ENCOUNTER — Encounter: Payer: Self-pay | Admitting: Internal Medicine

## 2020-06-01 ENCOUNTER — Telehealth (INDEPENDENT_AMBULATORY_CARE_PROVIDER_SITE_OTHER): Payer: Medicare Other | Admitting: Internal Medicine

## 2020-06-01 ENCOUNTER — Telehealth: Payer: Self-pay

## 2020-06-01 ENCOUNTER — Telehealth: Payer: Self-pay | Admitting: Internal Medicine

## 2020-06-01 VITALS — Ht 62.0 in | Wt 135.5 lb

## 2020-06-01 DIAGNOSIS — R7303 Prediabetes: Secondary | ICD-10-CM

## 2020-06-01 DIAGNOSIS — E7800 Pure hypercholesterolemia, unspecified: Secondary | ICD-10-CM

## 2020-06-01 DIAGNOSIS — N183 Chronic kidney disease, stage 3 unspecified: Secondary | ICD-10-CM | POA: Diagnosis not present

## 2020-06-01 DIAGNOSIS — N179 Acute kidney failure, unspecified: Secondary | ICD-10-CM

## 2020-06-01 DIAGNOSIS — E039 Hypothyroidism, unspecified: Secondary | ICD-10-CM

## 2020-06-01 DIAGNOSIS — N1832 Chronic kidney disease, stage 3b: Secondary | ICD-10-CM

## 2020-06-01 LAB — COMPREHENSIVE METABOLIC PANEL
ALT: 10 U/L (ref 0–35)
AST: 17 U/L (ref 0–37)
Albumin: 4 g/dL (ref 3.5–5.2)
Alkaline Phosphatase: 105 U/L (ref 39–117)
BUN: 57 mg/dL — ABNORMAL HIGH (ref 6–23)
CO2: 21 mEq/L (ref 19–32)
Calcium: 9.6 mg/dL (ref 8.4–10.5)
Chloride: 109 mEq/L (ref 96–112)
Creatinine, Ser: 3.22 mg/dL — ABNORMAL HIGH (ref 0.40–1.20)
GFR: 14.16 mL/min — CL (ref >60.00)
Glucose, Bld: 91 mg/dL (ref 70–99)
Potassium: 4 mEq/L (ref 3.5–5.1)
Sodium: 140 mEq/L (ref 135–145)
Total Bilirubin: 0.3 mg/dL (ref 0.2–1.2)
Total Protein: 6.5 g/dL (ref 6.0–8.3)

## 2020-06-01 LAB — LIPID PANEL
Cholesterol: 141 mg/dL (ref 0–200)
HDL: 43.7 mg/dL (ref >39.00)
LDL Cholesterol: 68 mg/dL (ref 0–99)
NonHDL: 97.06
Total CHOL/HDL Ratio: 3
Triglycerides: 147 mg/dL (ref 0.0–149.0)
VLDL: 29.4 mg/dL (ref 0.0–40.0)

## 2020-06-01 LAB — VITAMIN D 25 HYDROXY (VIT D DEFICIENCY, FRACTURES): VITD: 36.86 ng/mL (ref 30.00–100.00)

## 2020-06-01 LAB — HEMOGLOBIN A1C: Hgb A1c MFr Bld: 5.6 % (ref 4.6–6.5)

## 2020-06-01 LAB — TSH: TSH: 4.17 u[IU]/mL (ref 0.35–4.50)

## 2020-06-01 LAB — PHOSPHORUS: Phosphorus: 4.8 mg/dL — ABNORMAL HIGH (ref 2.3–4.6)

## 2020-06-01 NOTE — Telephone Encounter (Signed)
Her GFR came back at 14 ml/min , which is  really low,  It appears that she is in kidney failure.  I would like her to be scheduled for a telephone visit at 1:15

## 2020-06-01 NOTE — Progress Notes (Signed)
Telephone Note  This visit type was conducted due to national recommendations for restrictions regarding the COVID-19 pandemic (e.g. social distancing).  This format is felt to be most appropriate for this patient at this time.  All issues noted in this document were discussed and addressed.  No physical exam was performed (except for noted visual exam findings with Video Visits).   I connected with@ on 06/01/20 at  1:00 PM EDT by  telephone and verified that I am speaking with the correct person using two identifiers. Location patient: home Location provider: work or home office Persons participating in the virtual visit: patient, provider  I discussed the limitations, risks, security and privacy concerns of performing an evaluation and management service by telephone and the availability of in person appointments. I also discussed with the patient that there may be a patient responsible charge related to this service. The patient expressed understanding and agreed to proceed.   Reason for visit: critcal lab results indicating renal failure  HPI:  69 yr old female with stage 4 adenocarcinoma of lung, currently on palliative treatment with Nerlynx, CKD stage 3 , both managed by [facility].  Called to review critical labs with near tripling of Cr.  GFR 14 ml/min,  Cr 3.22.  Taking a chemotherapeutic agent  That causes diarrhea,  Has been having 3 to 4 loose stools for the 3 or 4 days , more nausea than usual, which  has been limiting her hydration efforts. Cr was 1.49 in April and July.    Has seen Dr Radene Knee in July 2021 at [facility] for nephrology follow up, advised that Nerlynx can cause renal failure syndrome. Advised patient today to dc the medication , call her oncologist and nephrologist at [facility]    Treat nausea aggressively and hydrate . Will schedule a repeat BMEt here on Friday in the event she can not schedule follow up at [facility]>     ROS: See pertinent positives and negatives per HPI.  Past Medical  History:  Diagnosis Date  . Allergy    seasonal  . Carpal tunnel syndrome   . Degenerative joint disease   . Depression   . Endometriosis 1989   small amount  . Glaucoma    left eye, Dr. Morrison Old in [place]  . HSV infection   . Hypertension   . Osteoporosis    osteopenia, Fosamax then Boniva 2009  . Vitamin D deficiency    on supplements    Past Surgical History:  Procedure Laterality Date  . APPENDECTOMY  1964   open  . CARPAL TUNNEL RELEASE Right 01/2014   Dr. Malvin Johns  . FOOT NEUROMA SURGERY  May 2016   Left foot  . LAPAROSCOPY  1999  . NASAL SINUS SURGERY  2012   Dr. Pryor Ochoa  . THROAT SURGERY  05/02/2018  . TONSILLECTOMY    . TUBAL LIGATION  1992    Family History  Problem Relation Age of Onset  . Osteoporosis Mother        secondary to steroids  . Rheum arthritis Mother   . Hypertension Mother   . COPD Mother   . Arthritis Mother   . Stroke Mother   . Cancer Father        prostate  . Depression Son   . Anxiety disorder Son   . ADD / ADHD Son   . ADD / ADHD Son   . Hypertrophic cardiomyopathy Sister   . Schizophrenia Paternal Aunt   . Heart disease Maternal Grandmother   .  COPD Maternal Grandfather   . Depression Maternal Uncle     SOCIAL HX:  reports that she quit smoking about 36 years ago. Her smoking use included cigarettes. She has a 16.00 pack-year smoking history. She has never used smokeless tobacco. She reports current alcohol use. She reports that she does not use drugs.   Current Outpatient Medications:  .  alclomethasone (ACLOVATE) 0.05 % cream, APPLY EXTERNALLY TO THE AFFECTED AREA TWICE DAILY, Disp: , Rfl:  .  azelastine (ASTELIN) 0.1 % nasal spray, PLACE 1 SPRAY INTO BOTH NOSTRILS AT BEDTIME AS NEEDED, Disp: 30 mL, Rfl: 6 .  buPROPion (WELLBUTRIN XL) 150 MG 24 hr tablet, Take 1 tablet (150 mg total) by mouth daily., Disp: 90 tablet, Rfl: 1 .  clonazePAM (KLONOPIN) 0.5 MG tablet, Take 1 tablet (0.5 mg total) by mouth 2 (two) times daily as needed  for anxiety., Disp: 60 tablet, Rfl: 5 .  diphenoxylate-atropine (LOMOTIL) 2.5-0.025 MG tablet, TK 1 T PO QID PRF DH, Disp: , Rfl:  .  levothyroxine (SYNTHROID) 75 MCG tablet, TAKE 1 TABLET(75 MCG) BY MOUTH DAILY BEFORE BREAKFAST, Disp: 90 tablet, Rfl: 0 .  loperamide (IMODIUM) 2 MG capsule, Take 4 mg by mouth once. , Disp: , Rfl:  .  loratadine (CLARITIN) 10 MG tablet, Take 10 mg by mouth daily.  , Disp: , Rfl:  .  metoprolol succinate (TOPROL-XL) 25 MG 24 hr tablet, Take 3 tablets (75 mg total) by mouth daily. Take with or immediately following a meal., Disp: 270 tablet, Rfl: 3 .  minocycline (MINOCIN) 50 MG capsule, Take 50 mg by mouth 2 (two) times daily. , Disp: , Rfl:  .  Multiple Vitamin (MULTIVITAMIN) tablet, Take 1 tablet by mouth daily., Disp: , Rfl:  .  NERLYNX 40 MG tablet, , Disp: , Rfl:  .  prochlorperazine (COMPAZINE) 5 MG tablet, , Disp: , Rfl:   EXAM:  General impression: alert, cooperative and articulate.  No signs of being in distress  Lungs: speech is fluent sentence length suggests that patient is not short of breath and not punctuated by cough, sneezing or sniffing. [name]   Psych: affect normal.  speech is articulate and non pressured .  Denies suicidal thoughts   ASSESSMENT AND PLAN:  Discussed the following assessment and plan:  Acute renal failure superimposed on stage 3 chronic kidney disease, unspecified acute renal failure type, unspecified whether stage 3a or 3b CKD ([place]) - Plan: Basic metabolic panel  Acute renal failure superimposed on stage 3 chronic kidney disease ([place]) Secondary to dehydration from use of Nerlatinib (Nerlynx) for palliative treatment of stage 4 lung CA.  Advised to stop medication.  Will avoid ER unless she develops intractable nausea and vomiting since lytes are normal and she is not acidotic.  Repeat BMET on Friday pending other plans by nephrology     I discussed the assessment and treatment plan with the patient. The patient was provided an  opportunity to ask questions and all were answered. The patient agreed with the plan and demonstrated an understanding of the instructions.   The patient was advised to call back or seek an in-person evaluation if the symptoms worsen or if the condition fails to improve as anticipated.  I provided  20 minutes of non-face-to-face time during this encounter.   Crecencio Mc, MD

## 2020-06-01 NOTE — Telephone Encounter (Signed)
Patient has been scheduled fo r1 today for FU on Kidney function.

## 2020-06-01 NOTE — Telephone Encounter (Signed)
CRITICAL VALUE STICKER  CRITICAL VALUE: GFR 14.16  RECEIVER (on-site recipient of call): Wali Reinheimer  DATE & TIME NOTIFIED: 06/01/20; 11:20 am  MESSENGER (representative from lab): Sy  MD NOTIFIED: Derrel Nip  TIME OF NOTIFICATION: 11: 20 am  RESPONSE:

## 2020-06-01 NOTE — Assessment & Plan Note (Signed)
Secondary to dehydration from use of Nerlatinib (Nerlynx) for palliative treatment of stage 4 lung CA.  Advised to stop medication.  Will avoid ER unless she develops intractable nausea and vomiting since lytes are normal and she is not acidotic.  Repeat BMET on Friday pending other plans by nephrology

## 2020-06-02 ENCOUNTER — Telehealth: Payer: Self-pay | Admitting: Internal Medicine

## 2020-06-02 ENCOUNTER — Ambulatory Visit: Admit: 2020-06-02 | Discharge: 2020-06-03 | Payer: MEDICARE

## 2020-06-02 DIAGNOSIS — Z8679 Personal history of other diseases of the circulatory system: Principal | ICD-10-CM

## 2020-06-02 DIAGNOSIS — C3490 Malignant neoplasm of unspecified part of unspecified bronchus or lung: Principal | ICD-10-CM

## 2020-06-02 DIAGNOSIS — N289 Disorder of kidney and ureter, unspecified: Secondary | ICD-10-CM | POA: Diagnosis not present

## 2020-06-02 DIAGNOSIS — N179 Acute kidney failure, unspecified: Secondary | ICD-10-CM | POA: Diagnosis not present

## 2020-06-02 NOTE — Telephone Encounter (Signed)
Patient called in stated that she saw the nephrology and she did her labs there.

## 2020-06-03 ENCOUNTER — Other Ambulatory Visit: Payer: Medicare Other

## 2020-06-03 ENCOUNTER — Ambulatory Visit: Admit: 2020-06-03 | Discharge: 2020-06-04 | Payer: MEDICARE

## 2020-06-03 DIAGNOSIS — Z8679 Personal history of other diseases of the circulatory system: Principal | ICD-10-CM

## 2020-06-03 DIAGNOSIS — C3490 Malignant neoplasm of unspecified part of unspecified bronchus or lung: Secondary | ICD-10-CM | POA: Diagnosis not present

## 2020-06-03 DIAGNOSIS — N179 Acute kidney failure, unspecified: Secondary | ICD-10-CM | POA: Diagnosis not present

## 2020-06-03 DIAGNOSIS — N289 Disorder of kidney and ureter, unspecified: Principal | ICD-10-CM

## 2020-06-03 LAB — PTH, INTACT AND CALCIUM
Calcium: 9.7 mg/dL (ref 8.6–10.4)
PTH: 52 pg/mL (ref 14–64)

## 2020-06-04 LAB — ALKALINE PHOSPHATASE, ISOENZYMES
Alkaline Phosphatase: 125 IU/L — ABNORMAL HIGH (ref 48–121)
BONE FRACTION: 50 % (ref 14–68)
INTESTINAL FRAC.: 3 % (ref 0–18)
LIVER FRACTION: 47 % (ref 18–85)

## 2020-06-07 ENCOUNTER — Ambulatory Visit: Admit: 2020-06-07 | Discharge: 2020-06-08 | Payer: MEDICARE

## 2020-06-07 DIAGNOSIS — N179 Acute kidney failure, unspecified: Secondary | ICD-10-CM | POA: Diagnosis not present

## 2020-06-07 DIAGNOSIS — C3490 Malignant neoplasm of unspecified part of unspecified bronchus or lung: Principal | ICD-10-CM

## 2020-06-07 MED ORDER — ONDANSETRON HCL 8 MG TABLET
ORAL_TABLET | Freq: Two times a day (BID) | ORAL | 2 refills | 30.00000 days | Status: CP | PRN
Start: 2020-06-07 — End: 2021-06-07

## 2020-06-09 ENCOUNTER — Ambulatory Visit: Admit: 2020-06-09 | Discharge: 2020-06-10 | Payer: MEDICARE

## 2020-06-09 DIAGNOSIS — N179 Acute kidney failure, unspecified: Secondary | ICD-10-CM | POA: Diagnosis not present

## 2020-06-10 ENCOUNTER — Ambulatory Visit: Admit: 2020-06-10 | Discharge: 2020-06-11 | Payer: MEDICARE

## 2020-06-10 DIAGNOSIS — N1831 Chronic kidney disease, stage 3a: Principal | ICD-10-CM

## 2020-06-10 DIAGNOSIS — Z79899 Other long term (current) drug therapy: Principal | ICD-10-CM

## 2020-06-10 DIAGNOSIS — C3490 Malignant neoplasm of unspecified part of unspecified bronchus or lung: Principal | ICD-10-CM

## 2020-06-10 DIAGNOSIS — N179 Acute kidney failure, unspecified: Secondary | ICD-10-CM | POA: Diagnosis not present

## 2020-06-10 DIAGNOSIS — R112 Nausea with vomiting, unspecified: Principal | ICD-10-CM

## 2020-06-10 DIAGNOSIS — R197 Diarrhea, unspecified: Principal | ICD-10-CM

## 2020-06-10 DIAGNOSIS — N19 Unspecified kidney failure: Principal | ICD-10-CM

## 2020-06-10 DIAGNOSIS — E860 Dehydration: Principal | ICD-10-CM

## 2020-06-13 ENCOUNTER — Ambulatory Visit: Admit: 2020-06-13 | Discharge: 2020-06-13 | Payer: MEDICARE

## 2020-06-13 DIAGNOSIS — R197 Diarrhea, unspecified: Principal | ICD-10-CM

## 2020-06-13 DIAGNOSIS — E860 Dehydration: Principal | ICD-10-CM

## 2020-06-13 DIAGNOSIS — C3490 Malignant neoplasm of unspecified part of unspecified bronchus or lung: Secondary | ICD-10-CM | POA: Diagnosis not present

## 2020-06-13 DIAGNOSIS — Z79899 Other long term (current) drug therapy: Principal | ICD-10-CM

## 2020-06-13 DIAGNOSIS — R112 Nausea with vomiting, unspecified: Secondary | ICD-10-CM

## 2020-06-13 DIAGNOSIS — N19 Unspecified kidney failure: Principal | ICD-10-CM

## 2020-06-15 ENCOUNTER — Institutional Professional Consult (permissible substitution): Admit: 2020-06-15 | Discharge: 2020-06-16 | Payer: MEDICARE

## 2020-06-16 ENCOUNTER — Ambulatory Visit: Admit: 2020-06-16 | Discharge: 2020-06-17 | Payer: MEDICARE

## 2020-06-16 DIAGNOSIS — C3490 Malignant neoplasm of unspecified part of unspecified bronchus or lung: Principal | ICD-10-CM

## 2020-06-22 ENCOUNTER — Institutional Professional Consult (permissible substitution): Admit: 2020-06-22 | Discharge: 2020-06-23 | Payer: MEDICARE

## 2020-06-23 ENCOUNTER — Ambulatory Visit: Admit: 2020-06-23 | Discharge: 2020-06-24 | Payer: MEDICARE

## 2020-06-23 DIAGNOSIS — N179 Acute kidney failure, unspecified: Secondary | ICD-10-CM | POA: Diagnosis not present

## 2020-06-23 DIAGNOSIS — N1831 Chronic kidney disease, stage 3a: Secondary | ICD-10-CM | POA: Diagnosis not present

## 2020-06-24 DIAGNOSIS — R9389 Abnormal findings on diagnostic imaging of other specified body structures: Principal | ICD-10-CM

## 2020-06-24 DIAGNOSIS — C3490 Malignant neoplasm of unspecified part of unspecified bronchus or lung: Principal | ICD-10-CM

## 2020-06-24 DIAGNOSIS — N19 Unspecified kidney failure: Principal | ICD-10-CM

## 2020-06-30 ENCOUNTER — Ambulatory Visit: Admit: 2020-06-30 | Discharge: 2020-06-30 | Payer: MEDICARE

## 2020-06-30 ENCOUNTER — Institutional Professional Consult (permissible substitution): Admit: 2020-06-30 | Discharge: 2020-06-30 | Payer: MEDICARE

## 2020-06-30 DIAGNOSIS — N19 Unspecified kidney failure: Principal | ICD-10-CM

## 2020-06-30 DIAGNOSIS — R9389 Abnormal findings on diagnostic imaging of other specified body structures: Principal | ICD-10-CM

## 2020-06-30 DIAGNOSIS — C3490 Malignant neoplasm of unspecified part of unspecified bronchus or lung: Principal | ICD-10-CM

## 2020-07-01 DIAGNOSIS — Z23 Encounter for immunization: Secondary | ICD-10-CM | POA: Diagnosis not present

## 2020-07-05 ENCOUNTER — Ambulatory Visit
Admit: 2020-07-05 | Discharge: 2020-07-05 | Payer: MEDICARE | Attending: Hematology & Oncology | Primary: Hematology & Oncology

## 2020-07-05 ENCOUNTER — Ambulatory Visit: Admit: 2020-07-05 | Discharge: 2020-07-05 | Payer: MEDICARE

## 2020-07-05 ENCOUNTER — Ambulatory Visit
Admit: 2020-07-05 | Discharge: 2020-07-05 | Payer: MEDICARE | Attending: Geriatric Medicine | Primary: Geriatric Medicine

## 2020-07-05 DIAGNOSIS — C3490 Malignant neoplasm of unspecified part of unspecified bronchus or lung: Principal | ICD-10-CM

## 2020-07-05 DIAGNOSIS — Z23 Encounter for immunization: Secondary | ICD-10-CM | POA: Diagnosis not present

## 2020-07-05 DIAGNOSIS — R197 Diarrhea, unspecified: Secondary | ICD-10-CM | POA: Diagnosis not present

## 2020-07-05 DIAGNOSIS — Z515 Encounter for palliative care: Secondary | ICD-10-CM | POA: Diagnosis not present

## 2020-07-08 ENCOUNTER — Telehealth: Admit: 2020-07-08 | Discharge: 2020-07-09 | Payer: MEDICARE

## 2020-07-08 ENCOUNTER — Ambulatory Visit: Admit: 2020-07-08 | Discharge: 2020-07-09 | Payer: MEDICARE

## 2020-07-08 DIAGNOSIS — C3490 Malignant neoplasm of unspecified part of unspecified bronchus or lung: Principal | ICD-10-CM

## 2020-07-12 ENCOUNTER — Ambulatory Visit
Admit: 2020-07-12 | Discharge: 2020-07-13 | Payer: MEDICARE | Attending: Hematology & Oncology | Primary: Hematology & Oncology

## 2020-07-12 ENCOUNTER — Other Ambulatory Visit: Admit: 2020-07-12 | Discharge: 2020-07-13 | Payer: MEDICARE

## 2020-07-12 ENCOUNTER — Ambulatory Visit: Admit: 2020-07-12 | Discharge: 2020-07-13 | Payer: MEDICARE

## 2020-07-12 DIAGNOSIS — C3490 Malignant neoplasm of unspecified part of unspecified bronchus or lung: Principal | ICD-10-CM

## 2020-07-12 DIAGNOSIS — E039 Hypothyroidism, unspecified: Secondary | ICD-10-CM | POA: Diagnosis not present

## 2020-07-12 DIAGNOSIS — Z5111 Encounter for antineoplastic chemotherapy: Secondary | ICD-10-CM | POA: Diagnosis not present

## 2020-07-12 DIAGNOSIS — I129 Hypertensive chronic kidney disease with stage 1 through stage 4 chronic kidney disease, or unspecified chronic kidney disease: Secondary | ICD-10-CM | POA: Diagnosis not present

## 2020-07-12 DIAGNOSIS — Z902 Acquired absence of lung [part of]: Secondary | ICD-10-CM | POA: Diagnosis not present

## 2020-07-12 DIAGNOSIS — N189 Chronic kidney disease, unspecified: Secondary | ICD-10-CM | POA: Diagnosis not present

## 2020-07-12 DIAGNOSIS — Z87891 Personal history of nicotine dependence: Secondary | ICD-10-CM | POA: Diagnosis not present

## 2020-07-12 DIAGNOSIS — M1990 Unspecified osteoarthritis, unspecified site: Secondary | ICD-10-CM | POA: Diagnosis not present

## 2020-07-12 DIAGNOSIS — Z79899 Other long term (current) drug therapy: Secondary | ICD-10-CM | POA: Diagnosis not present

## 2020-07-20 ENCOUNTER — Ambulatory Visit: Admit: 2020-07-20 | Discharge: 2020-07-21 | Payer: MEDICARE

## 2020-07-20 DIAGNOSIS — L570 Actinic keratosis: Principal | ICD-10-CM

## 2020-07-20 DIAGNOSIS — Z6834 Body mass index (BMI) 34.0-34.9, adult: Secondary | ICD-10-CM | POA: Diagnosis not present

## 2020-07-20 DIAGNOSIS — Z87891 Personal history of nicotine dependence: Secondary | ICD-10-CM | POA: Diagnosis not present

## 2020-07-20 DIAGNOSIS — R7303 Prediabetes: Secondary | ICD-10-CM | POA: Diagnosis not present

## 2020-07-20 DIAGNOSIS — L814 Other melanin hyperpigmentation: Secondary | ICD-10-CM | POA: Diagnosis not present

## 2020-07-20 DIAGNOSIS — E663 Overweight: Secondary | ICD-10-CM | POA: Diagnosis not present

## 2020-07-20 DIAGNOSIS — Z79899 Other long term (current) drug therapy: Secondary | ICD-10-CM | POA: Diagnosis not present

## 2020-07-20 DIAGNOSIS — L601 Onycholysis: Secondary | ICD-10-CM | POA: Diagnosis not present

## 2020-07-20 DIAGNOSIS — I10 Essential (primary) hypertension: Secondary | ICD-10-CM | POA: Diagnosis not present

## 2020-07-20 DIAGNOSIS — C3490 Malignant neoplasm of unspecified part of unspecified bronchus or lung: Secondary | ICD-10-CM | POA: Diagnosis not present

## 2020-08-01 LAB — BASIC METABOLIC PANEL
BUN: 17 (ref 4–21)
Creatinine: 1.4 — AB (ref 0.5–1.1)

## 2020-08-02 ENCOUNTER — Ambulatory Visit: Admit: 2020-08-02 | Discharge: 2020-08-03 | Payer: MEDICARE | Attending: Adult Health | Primary: Adult Health

## 2020-08-02 ENCOUNTER — Ambulatory Visit
Admit: 2020-08-02 | Discharge: 2020-08-03 | Payer: MEDICARE | Attending: Geriatric Medicine | Primary: Geriatric Medicine

## 2020-08-02 ENCOUNTER — Other Ambulatory Visit: Admit: 2020-08-02 | Discharge: 2020-08-03 | Payer: MEDICARE

## 2020-08-02 ENCOUNTER — Ambulatory Visit: Admit: 2020-08-02 | Discharge: 2020-08-03 | Payer: MEDICARE

## 2020-08-02 ENCOUNTER — Institutional Professional Consult (permissible substitution): Admit: 2020-08-02 | Discharge: 2020-08-03 | Payer: MEDICARE

## 2020-08-02 DIAGNOSIS — C3490 Malignant neoplasm of unspecified part of unspecified bronchus or lung: Principal | ICD-10-CM

## 2020-08-02 DIAGNOSIS — Z5111 Encounter for antineoplastic chemotherapy: Secondary | ICD-10-CM | POA: Diagnosis not present

## 2020-08-02 DIAGNOSIS — Z515 Encounter for palliative care: Secondary | ICD-10-CM | POA: Diagnosis not present

## 2020-08-10 ENCOUNTER — Other Ambulatory Visit: Payer: Self-pay

## 2020-08-10 ENCOUNTER — Encounter: Payer: Self-pay | Admitting: Internal Medicine

## 2020-08-10 ENCOUNTER — Telehealth (INDEPENDENT_AMBULATORY_CARE_PROVIDER_SITE_OTHER): Payer: Medicare Other | Admitting: Internal Medicine

## 2020-08-10 VITALS — Ht 62.0 in | Wt 140.0 lb

## 2020-08-10 DIAGNOSIS — E538 Deficiency of other specified B group vitamins: Secondary | ICD-10-CM | POA: Diagnosis not present

## 2020-08-10 DIAGNOSIS — R7301 Impaired fasting glucose: Secondary | ICD-10-CM

## 2020-08-10 DIAGNOSIS — F419 Anxiety disorder, unspecified: Secondary | ICD-10-CM | POA: Diagnosis not present

## 2020-08-10 DIAGNOSIS — F32A Depression, unspecified: Secondary | ICD-10-CM | POA: Diagnosis not present

## 2020-08-10 DIAGNOSIS — N179 Acute kidney failure, unspecified: Secondary | ICD-10-CM | POA: Diagnosis not present

## 2020-08-10 DIAGNOSIS — N183 Chronic kidney disease, stage 3 unspecified: Secondary | ICD-10-CM | POA: Diagnosis not present

## 2020-08-10 DIAGNOSIS — R5383 Other fatigue: Secondary | ICD-10-CM

## 2020-08-10 DIAGNOSIS — T451X5A Adverse effect of antineoplastic and immunosuppressive drugs, initial encounter: Secondary | ICD-10-CM | POA: Diagnosis not present

## 2020-08-10 DIAGNOSIS — R634 Abnormal weight loss: Secondary | ICD-10-CM | POA: Diagnosis not present

## 2020-08-10 DIAGNOSIS — I10 Essential (primary) hypertension: Secondary | ICD-10-CM | POA: Diagnosis not present

## 2020-08-10 DIAGNOSIS — R110 Nausea: Secondary | ICD-10-CM | POA: Diagnosis not present

## 2020-08-10 MED ORDER — BUPROPION HCL ER (XL) 150 MG PO TB24: 150.0000 mg | ORAL_TABLET | Freq: Every day | ORAL | 1 refills | Status: DC

## 2020-08-10 NOTE — Patient Instructions (Signed)
The office will call you to set up your lab appointment

## 2020-08-10 NOTE — Progress Notes (Signed)
Virtual Visit via [employer]  This visit type was conducted due to national recommendations for restrictions regarding the COVID-19 pandemic (e.g. social distancing).  This format is felt to be most appropriate for this patient at this time.  All issues noted in this document were discussed and addressed.  No physical exam was performed (except for noted visual exam findings with Video Visits).   I connected with@ on 08/10/20 at  3:00 PM EST by a video enabled telemedicine application  and verified that I am speaking with the correct person using two identifiers. Location patient: home Location provider: work or home office Persons participating in the virtual visit: patient, provider  I discussed the limitations, risks, security and privacy concerns of performing an evaluation and management service by telephone and the availability of in person appointments. I also discussed with the patient that there may be a patient responsible charge related to this service. The patient expressed understanding and agreed to proceed.  Reason for visit: follow up  HPI:  Stacy Santana is a 69 yr old. female former smoker with a 12 pack-year smoking history , diagnosed in  Dec 2016 with  pT2a N0 lung adenocarcinoma,  past medical history of HTN, hypothyroidism, OA, depression and glaucoma who returns for follow up on depression and hypertension.  She has had a recent change in therapy due to persistent intolerance to therapy Neratinib   Oncology history summarized below   - 08/2015 - persistent cough lead to a CT, which found a mass - bronch-mediated Bx of LUL mass showed non-specific inflammation - Followed with serial CT's - 03/2016 - CT showed interval increase in size of LUL mass (extending from just SUP to L hilum to apex) - Pt was asymptomatic at the time - 03/2016 - PFT's nl - PET showed FDG-avid lesion in LUL, no hot LN's or distant mets - 04/04/16 (Dr. Laverta [place]) - lobectomy and mediastinal LND  - Path showed  invasive mucinous adenocarcinoma (~5 cm) with a SM+ at the stapled vascular margin. 0/12 LN's. - completed adjuvant cis/pem and XRT in 1/18 - 7/18 local regional recurrence s/p resection 06/06/17, PD-L1 0%. Path showed invasive mucinous adenocarcinoma 1.6cm with negative margins. STRATA with ERBB2 mutation.  - recommended active surveillance - 12/23/17 PET concerning for recurrence - 01/06/18 FNA showed atypical epithelial cells - 02/06/18 L pneumonectomy 5.6cm invasive mucinous adenocarcinoma ([id number]) -10/27/18 started neratinib for PD in multiple right lung nodules, PD 07/2020  She is tolerating the new therapy which is infused every 3 weeks,  With primary side effects of hair loss  And fatigue. The fatigue is profound during the first week , requiring bed rest , and improves by the end of the second week.  The hair loss is moderate and she has been researching wigs and wraps   She is currently taking Wellbutrin XL 150 mg daily .  Hypertension: patient checks blood pressure twice weekly at home.  Readings have been for the most part < 140/80 at rest . Patient is following a reduce salt diet most days and is taking medications as prescribed   ROS: See pertinent positives and negatives per HPI.  Past Medical History:  Diagnosis Date  . Allergy    seasonal  . Carpal tunnel syndrome   . Degenerative joint disease   . Depression   . Endometriosis 1989   small amount  . Glaucoma    left eye, Dr. Morrison Old in [place]  . HSV infection   . Hypertension   . Osteoporosis  osteopenia, Fosamax then [facility] 2009  . Vitamin D deficiency    on supplements    Past Surgical History:  Procedure Laterality Date  . APPENDECTOMY  1964   open  . CARPAL TUNNEL RELEASE Right 01/2014   Dr. Malvin Johns  . FOOT NEUROMA SURGERY  May 2016   Left foot  . LAPAROSCOPY  1999  . NASAL SINUS SURGERY  2012   Dr. Pryor Ochoa  . THROAT SURGERY  05/02/2018  . TONSILLECTOMY    . TUBAL LIGATION  1992    Family History   Problem Relation Age of Onset  . Osteoporosis Mother        secondary to steroids  . Rheum arthritis Mother   . Hypertension Mother   . COPD Mother   . Arthritis Mother   . Stroke Mother   . Cancer Father        prostate  . Depression Son   . Anxiety disorder Son   . ADD / ADHD Son   . ADD / ADHD Son   . Hypertrophic cardiomyopathy Sister   . Schizophrenia Paternal Aunt   . Heart disease Maternal Grandmother   . COPD Maternal Grandfather   . Depression Maternal Uncle     SOCIAL HX:  reports that she quit smoking about 36 years ago. Her smoking use included cigarettes. She has a 16.00 pack-year smoking history. She has never used smokeless tobacco. She reports current alcohol use. She reports that she does not use drugs.   Current Outpatient Medications:  .  azelastine (ASTELIN) 0.1 % nasal spray, PLACE 1 SPRAY INTO BOTH NOSTRILS AT BEDTIME AS NEEDED, Disp: 30 mL, Rfl: 6 .  buPROPion (WELLBUTRIN XL) 150 MG 24 hr tablet, Take 1 tablet (150 mg total) by mouth daily. Increase to 2 tablets every 2 weeks, Disp: 111 tablet, Rfl: 1 .  clonazePAM (KLONOPIN) 0.5 MG tablet, Take 1 tablet (0.5 mg total) by mouth 2 (two) times daily as needed for anxiety., Disp: 60 tablet, Rfl: 5 .  levothyroxine (SYNTHROID) 75 MCG tablet, TAKE 1 TABLET(75 MCG) BY MOUTH DAILY BEFORE BREAKFAST, Disp: 90 tablet, Rfl: 0 .  loperamide (IMODIUM) 2 MG capsule, Take 4 mg by mouth once. , Disp: , Rfl:  .  loratadine (CLARITIN) 10 MG tablet, Take 10 mg by mouth daily.  , Disp: , Rfl:  .  metoprolol succinate (TOPROL-XL) 25 MG 24 hr tablet, Take 3 tablets (75 mg total) by mouth daily. Take with or immediately following a meal., Disp: 270 tablet, Rfl: 3 .  Multiple Vitamin (MULTIVITAMIN) tablet, Take 1 tablet by mouth daily., Disp: , Rfl:  .  ondansetron (ZOFRAN) 8 MG tablet, Take 8 mg by mouth every 8 (eight) hours as needed. , Disp: , Rfl:  .  prochlorperazine (COMPAZINE) 5 MG tablet, , Disp: , Rfl:   EXAM:  VITALS  per patient if applicable:  GENERAL: alert, oriented, appears well and in no acute distress  HEENT: atraumatic, conjunttiva clear, no obvious abnormalities on inspection of external nose and ears  NECK: normal movements of the head and neck  LUNGS: on inspection no signs of respiratory distress, breathing rate appears normal, no obvious gross SOB, gasping or wheezing  CV: no obvious cyanosis  MS: moves all visible extremities without noticeable abnormality  PSYCH/NEURO: pleasant and cooperative, no obvious depression or anxiety, speech and thought processing grossly intact  ASSESSMENT AND PLAN:  Discussed the following assessment and plan:  Fatigue, unspecified type - Plan: TSH, Comprehensive metabolic panel, Iron, TIBC  and Ferritin Panel  Weight loss, unintentional  Impaired fasting glucose - Plan: Hemoglobin A1c  Anxiety and depression - Plan: buPROPion (WELLBUTRIN XL) 150 MG 24 hr tablet  B12 deficiency - Plan: Vitamin B12  Acute renal failure superimposed on stage 3 chronic kidney disease, unspecified acute renal failure type, unspecified whether stage 3a or 3b CKD (HCC)  Chemotherapy-induced nausea  Primary hypertension  Acute renal failure superimposed on stage 3 chronic kidney disease ([place]) secondary to dehydration from side effects of previously used chemotherapy . Repeat labs done at [facility] have shown improvement.   Lab Results  Component Value Date   CREATININE 1.4 (A) 08/01/2020     Chemotherapy-induced nausea Improved with change in therapy.  Anxiety and depression Discussed trial of increased dose of wellbutrin to combat the fatigue she is experiencing   Hypertension Well controlled on current regimen. Renal function stable, no changes today.    I discussed the assessment and treatment plan with the patient. The patient was provided an opportunity to ask questions and all were answered. The patient agreed with the plan and demonstrated an understanding  of the instructions.   The patient was advised to call back or seek an in-person evaluation if the symptoms worsen or if the condition fails to improve as anticipated.  I provided 30 minutes of  face-to-face time during this encounter.   Crecencio Mc, MD

## 2020-08-11 ENCOUNTER — Telehealth: Payer: Self-pay | Admitting: Internal Medicine

## 2020-08-11 NOTE — Telephone Encounter (Signed)
Lm to call office to schedule a fasting lab.

## 2020-08-11 NOTE — Assessment & Plan Note (Signed)
Discussed trial of increased dose of wellbutrin to combat the fatigue she is experiencing

## 2020-08-11 NOTE — Assessment & Plan Note (Signed)
Well controlled on current regimen. Renal function stable, no changes today. 

## 2020-08-11 NOTE — Assessment & Plan Note (Addendum)
secondary to dehydration from side effects of previously used chemotherapy . Repeat labs done at [facility] have shown improvement.   Lab Results  Component Value Date   CREATININE 1.4 (A) 08/01/2020

## 2020-08-11 NOTE — Assessment & Plan Note (Signed)
Improved with change in therapy.

## 2020-08-12 ENCOUNTER — Telehealth: Payer: Medicare Other | Admitting: Internal Medicine

## 2020-08-12 ENCOUNTER — Ambulatory Visit: Admit: 2020-08-12 | Discharge: 2020-08-13 | Payer: MEDICARE

## 2020-08-12 DIAGNOSIS — N1832 Stage 3b chronic kidney disease (CMS-HCC): Principal | ICD-10-CM

## 2020-08-12 DIAGNOSIS — N179 Acute kidney failure, unspecified: Secondary | ICD-10-CM | POA: Diagnosis not present

## 2020-08-12 DIAGNOSIS — I10 Essential (primary) hypertension: Secondary | ICD-10-CM | POA: Diagnosis not present

## 2020-08-13 ENCOUNTER — Other Ambulatory Visit: Payer: Self-pay | Admitting: Internal Medicine

## 2020-08-16 ENCOUNTER — Other Ambulatory Visit (INDEPENDENT_AMBULATORY_CARE_PROVIDER_SITE_OTHER): Payer: Medicare Other

## 2020-08-16 ENCOUNTER — Other Ambulatory Visit: Payer: Self-pay

## 2020-08-16 DIAGNOSIS — R7301 Impaired fasting glucose: Secondary | ICD-10-CM | POA: Diagnosis not present

## 2020-08-16 DIAGNOSIS — E538 Deficiency of other specified B group vitamins: Secondary | ICD-10-CM

## 2020-08-16 DIAGNOSIS — R5383 Other fatigue: Secondary | ICD-10-CM

## 2020-08-16 DIAGNOSIS — N183 Chronic kidney disease, stage 3 unspecified: Secondary | ICD-10-CM

## 2020-08-16 DIAGNOSIS — N179 Acute kidney failure, unspecified: Secondary | ICD-10-CM

## 2020-08-16 LAB — BASIC METABOLIC PANEL
BUN: 17 mg/dL (ref 6–23)
CO2: 26 mEq/L (ref 19–32)
Calcium: 9.7 mg/dL (ref 8.4–10.5)
Chloride: 102 mEq/L (ref 96–112)
Creatinine, Ser: 1.34 mg/dL — ABNORMAL HIGH (ref 0.40–1.20)
GFR: 39.87 mL/min — ABNORMAL LOW (ref >60.00)
Glucose, Bld: 88 mg/dL (ref 70–99)
Potassium: 4 mEq/L (ref 3.5–5.1)
Sodium: 136 mEq/L (ref 135–145)

## 2020-08-16 LAB — TSH: TSH: 0.74 u[IU]/mL (ref 0.35–4.50)

## 2020-08-16 LAB — COMPREHENSIVE METABOLIC PANEL
ALT: 17 U/L (ref 0–35)
AST: 24 U/L (ref 0–37)
Albumin: 4.3 g/dL (ref 3.5–5.2)
Alkaline Phosphatase: 88 U/L (ref 39–117)
BUN: 17 mg/dL (ref 6–23)
CO2: 26 mEq/L (ref 19–32)
Calcium: 9.7 mg/dL (ref 8.4–10.5)
Chloride: 102 mEq/L (ref 96–112)
Creatinine, Ser: 1.34 mg/dL — ABNORMAL HIGH (ref 0.40–1.20)
GFR: 39.87 mL/min — ABNORMAL LOW (ref >60.00)
Glucose, Bld: 88 mg/dL (ref 70–99)
Potassium: 4 mEq/L (ref 3.5–5.1)
Sodium: 136 mEq/L (ref 135–145)
Total Bilirubin: 0.5 mg/dL (ref 0.2–1.2)
Total Protein: 6.8 g/dL (ref 6.0–8.3)

## 2020-08-16 LAB — VITAMIN B12: Vitamin B-12: 221 pg/mL (ref 211–911)

## 2020-08-16 LAB — HEMOGLOBIN A1C: Hgb A1c MFr Bld: 5 % (ref 4.6–6.5)

## 2020-08-17 LAB — IRON,TIBC AND FERRITIN PANEL
%SAT: 28 % (calc) (ref 16–45)
Ferritin: 242 ng/mL (ref 16–288)
Iron: 115 ug/dL (ref 45–160)
TIBC: 408 mcg/dL (calc) (ref 250–450)

## 2020-08-18 ENCOUNTER — Other Ambulatory Visit: Payer: Self-pay | Admitting: Internal Medicine

## 2020-08-18 DIAGNOSIS — F419 Anxiety disorder, unspecified: Principal | ICD-10-CM

## 2020-08-18 DIAGNOSIS — C3492 Malignant neoplasm of unspecified part of left bronchus or lung: Principal | ICD-10-CM

## 2020-08-18 DIAGNOSIS — E538 Deficiency of other specified B group vitamins: Secondary | ICD-10-CM

## 2020-08-18 DIAGNOSIS — B009 Herpesviral infection, unspecified: Principal | ICD-10-CM

## 2020-08-18 DIAGNOSIS — F32A Anxiety and depression: Principal | ICD-10-CM

## 2020-08-18 DIAGNOSIS — Z79899 Other long term (current) drug therapy: Principal | ICD-10-CM

## 2020-08-18 DIAGNOSIS — C3490 Malignant neoplasm of unspecified part of unspecified bronchus or lung: Principal | ICD-10-CM

## 2020-08-18 NOTE — Progress Notes (Signed)
Your labs suggest that We may have found a contributor to your fatigue.  your B12 level is low enough to call it deficient in my book.  I would like you to get 3 weekly injections , either here,  or if you are comfortable doing them at home, I will send the medication to your pharmacy .  You can start an oral supplement 2500 mcg daily while we are arranging this,  and we will  need to check your IF antibody. Your intrinisic factor antibody is positive, it means that  you have an antibody to  the receptor that allows the b12 to be absorbed in the stomach,  so you cannot absorb the b12 very well from your stomach.  So that means you will need to continue supplementing your  B12 level with monthly injections (after 4 weeks of weekly injections)    or use  sublingual liquid or tabs that dissolve under the tongue  Labs have been ordered.

## 2020-08-22 ENCOUNTER — Other Ambulatory Visit (INDEPENDENT_AMBULATORY_CARE_PROVIDER_SITE_OTHER): Payer: Medicare Other

## 2020-08-22 ENCOUNTER — Other Ambulatory Visit: Payer: Self-pay

## 2020-08-22 DIAGNOSIS — E538 Deficiency of other specified B group vitamins: Secondary | ICD-10-CM

## 2020-08-22 MED ORDER — CYANOCOBALAMIN 1000 MCG/ML IJ SOLN: INTRAMUSCULAR | 0 refills | Status: DC

## 2020-08-22 MED ORDER — CYANOCOBALAMIN (VIT B-12) 1,000 MCG/ML INJECTION SOLUTION
0 days
Start: 2020-08-22 — End: 2020-11-08

## 2020-08-23 ENCOUNTER — Other Ambulatory Visit: Admit: 2020-08-23 | Discharge: 2020-08-23 | Payer: MEDICARE

## 2020-08-23 ENCOUNTER — Ambulatory Visit
Admit: 2020-08-23 | Discharge: 2020-08-23 | Payer: MEDICARE | Attending: Hematology & Oncology | Primary: Hematology & Oncology

## 2020-08-23 ENCOUNTER — Ambulatory Visit: Admit: 2020-08-23 | Discharge: 2020-08-23 | Payer: MEDICARE

## 2020-08-23 DIAGNOSIS — R9389 Abnormal findings on diagnostic imaging of other specified body structures: Principal | ICD-10-CM

## 2020-08-23 DIAGNOSIS — C3490 Malignant neoplasm of unspecified part of unspecified bronchus or lung: Principal | ICD-10-CM

## 2020-08-23 DIAGNOSIS — Z87891 Personal history of nicotine dependence: Principal | ICD-10-CM

## 2020-08-23 DIAGNOSIS — N1832 Chronic kidney disease, stage 3b: Secondary | ICD-10-CM | POA: Diagnosis not present

## 2020-08-23 DIAGNOSIS — I129 Hypertensive chronic kidney disease with stage 1 through stage 4 chronic kidney disease, or unspecified chronic kidney disease: Secondary | ICD-10-CM | POA: Diagnosis not present

## 2020-08-23 DIAGNOSIS — J984 Other disorders of lung: Secondary | ICD-10-CM | POA: Diagnosis not present

## 2020-08-23 DIAGNOSIS — I10 Essential (primary) hypertension: Principal | ICD-10-CM

## 2020-08-24 LAB — FOLATE RBC: RBC Folate: 724 ng/mL RBC (ref >280)

## 2020-08-24 LAB — INTRINSIC FACTOR ANTIBODIES: Intrinsic Factor: NEGATIVE

## 2020-08-25 NOTE — Progress Notes (Signed)
Jana Half,  I'm sorry to hear that,  but thank God there is a plan..C?  Yes,  take the B12 injections,  because once you start traditional chemo you cannot rely on  adequate B12  absorption from the stomach , regardless of the negative intrinsic factor antibody test    May you feel the blessing of our God, the love of Jesus Christ, and the comfort of the [employer] this week as you walk through this [place] now and  in the weeks to come.    Regards,   Helene Kelp   .

## 2020-08-30 ENCOUNTER — Other Ambulatory Visit: Admit: 2020-08-30 | Discharge: 2020-08-31 | Payer: MEDICARE

## 2020-08-30 ENCOUNTER — Ambulatory Visit: Admit: 2020-08-30 | Discharge: 2020-08-31 | Payer: MEDICARE

## 2020-08-30 ENCOUNTER — Ambulatory Visit: Admit: 2020-08-30 | Discharge: 2020-08-31 | Payer: MEDICARE | Attending: Adult Health | Primary: Adult Health

## 2020-08-30 DIAGNOSIS — C3490 Malignant neoplasm of unspecified part of unspecified bronchus or lung: Principal | ICD-10-CM

## 2020-08-30 DIAGNOSIS — Z5111 Encounter for antineoplastic chemotherapy: Secondary | ICD-10-CM | POA: Diagnosis not present

## 2020-08-30 DIAGNOSIS — R9389 Abnormal findings on diagnostic imaging of other specified body structures: Secondary | ICD-10-CM | POA: Diagnosis not present

## 2020-08-30 MED ORDER — ONDANSETRON HCL 8 MG TABLET
ORAL_TABLET | 3 refills | 0 days | Status: CN
Start: 2020-08-30 — End: ?

## 2020-08-30 MED ORDER — FOLIC ACID 400 MCG TABLET
ORAL_TABLET | Freq: Every day | ORAL | 6 refills | 30 days | Status: CN
Start: 2020-08-30 — End: ?

## 2020-08-30 MED ORDER — DEXAMETHASONE 4 MG TABLET: tablet | 2 refills | 0 days | Status: AC

## 2020-08-30 MED ORDER — DEXAMETHASONE 4 MG TABLET
ORAL_TABLET | INTRAMUSCULAR | 2 refills | 0.00000 days | Status: CP
Start: 2020-08-30 — End: ?

## 2020-08-30 MED ORDER — PROCHLORPERAZINE MALEATE 10 MG TABLET
ORAL_TABLET | Freq: Four times a day (QID) | ORAL | 2 refills | 8.00000 days | Status: CN | PRN
Start: 2020-08-30 — End: ?

## 2020-09-06 ENCOUNTER — Telehealth: Admit: 2020-09-06 | Discharge: 2020-09-07 | Payer: MEDICARE

## 2020-09-14 ENCOUNTER — Institutional Professional Consult (permissible substitution): Admit: 2020-09-14 | Discharge: 2020-09-15 | Payer: MEDICARE

## 2020-09-14 DIAGNOSIS — C3490 Malignant neoplasm of unspecified part of unspecified bronchus or lung: Principal | ICD-10-CM

## 2020-09-14 DIAGNOSIS — R530 Neoplastic (malignant) related fatigue: Principal | ICD-10-CM

## 2020-09-14 DIAGNOSIS — Z515 Encounter for palliative care: Principal | ICD-10-CM

## 2020-09-16 ENCOUNTER — Other Ambulatory Visit: Payer: Self-pay

## 2020-09-16 MED ORDER — METOPROLOL SUCCINATE ER 25 MG PO TB24: 75.0000 mg | ORAL_TABLET | Freq: Every day | ORAL | 1 refills | Status: DC

## 2020-09-20 ENCOUNTER — Ambulatory Visit: Admit: 2020-09-20 | Discharge: 2020-09-21 | Payer: MEDICARE | Attending: Adult Health | Primary: Adult Health

## 2020-09-20 ENCOUNTER — Ambulatory Visit: Admit: 2020-09-20 | Discharge: 2020-09-21 | Payer: MEDICARE

## 2020-09-20 ENCOUNTER — Other Ambulatory Visit: Admit: 2020-09-20 | Discharge: 2020-09-21 | Payer: MEDICARE

## 2020-09-20 DIAGNOSIS — Z87891 Personal history of nicotine dependence: Principal | ICD-10-CM

## 2020-09-20 DIAGNOSIS — C3490 Malignant neoplasm of unspecified part of unspecified bronchus or lung: Principal | ICD-10-CM

## 2020-09-20 DIAGNOSIS — J309 Allergic rhinitis, unspecified: Principal | ICD-10-CM

## 2020-09-20 DIAGNOSIS — F32A Depression, unspecified: Principal | ICD-10-CM

## 2020-09-20 DIAGNOSIS — R9389 Abnormal findings on diagnostic imaging of other specified body structures: Principal | ICD-10-CM

## 2020-09-20 DIAGNOSIS — N189 Chronic kidney disease, unspecified: Principal | ICD-10-CM

## 2020-09-20 DIAGNOSIS — I129 Hypertensive chronic kidney disease with stage 1 through stage 4 chronic kidney disease, or unspecified chronic kidney disease: Secondary | ICD-10-CM | POA: Diagnosis not present

## 2020-09-20 DIAGNOSIS — R197 Diarrhea, unspecified: Secondary | ICD-10-CM | POA: Diagnosis not present

## 2020-09-20 DIAGNOSIS — E039 Hypothyroidism, unspecified: Secondary | ICD-10-CM | POA: Diagnosis not present

## 2020-09-20 DIAGNOSIS — H409 Unspecified glaucoma: Secondary | ICD-10-CM | POA: Diagnosis not present

## 2020-09-20 DIAGNOSIS — M1990 Unspecified osteoarthritis, unspecified site: Secondary | ICD-10-CM | POA: Diagnosis not present

## 2020-09-20 DIAGNOSIS — Z902 Acquired absence of lung [part of]: Secondary | ICD-10-CM | POA: Diagnosis not present

## 2020-10-06 DIAGNOSIS — C3490 Malignant neoplasm of unspecified part of unspecified bronchus or lung: Principal | ICD-10-CM

## 2020-10-06 DIAGNOSIS — R9389 Abnormal findings on diagnostic imaging of other specified body structures: Principal | ICD-10-CM

## 2020-10-10 ENCOUNTER — Ambulatory Visit: Admit: 2020-10-10 | Discharge: 2020-10-11 | Payer: MEDICARE

## 2020-10-10 DIAGNOSIS — C3490 Malignant neoplasm of unspecified part of unspecified bronchus or lung: Principal | ICD-10-CM

## 2020-10-11 ENCOUNTER — Other Ambulatory Visit: Admit: 2020-10-11 | Discharge: 2020-10-11 | Payer: MEDICARE

## 2020-10-11 ENCOUNTER — Ambulatory Visit: Admit: 2020-10-11 | Discharge: 2020-10-11 | Payer: MEDICARE

## 2020-10-11 ENCOUNTER — Ambulatory Visit
Admit: 2020-10-11 | Discharge: 2020-10-11 | Payer: MEDICARE | Attending: Hematology & Oncology | Primary: Hematology & Oncology

## 2020-10-11 DIAGNOSIS — C3492 Malignant neoplasm of unspecified part of left bronchus or lung: Principal | ICD-10-CM

## 2020-10-11 DIAGNOSIS — C3490 Malignant neoplasm of unspecified part of unspecified bronchus or lung: Principal | ICD-10-CM

## 2020-10-11 DIAGNOSIS — Z87891 Personal history of nicotine dependence: Principal | ICD-10-CM

## 2020-10-11 DIAGNOSIS — R9389 Abnormal findings on diagnostic imaging of other specified body structures: Principal | ICD-10-CM

## 2020-10-11 DIAGNOSIS — I10 Essential (primary) hypertension: Principal | ICD-10-CM

## 2020-10-11 DIAGNOSIS — Z902 Acquired absence of lung [part of]: Principal | ICD-10-CM

## 2020-10-14 ENCOUNTER — Telehealth: Admit: 2020-10-14 | Discharge: 2020-10-15 | Payer: MEDICARE

## 2020-10-18 ENCOUNTER — Ambulatory Visit: Payer: Medicare Other

## 2020-10-27 ENCOUNTER — Ambulatory Visit (INDEPENDENT_AMBULATORY_CARE_PROVIDER_SITE_OTHER): Payer: Medicare Other

## 2020-10-27 VITALS — Ht 62.0 in | Wt 140.0 lb

## 2020-10-27 DIAGNOSIS — Z0000 Encounter for general adult medical examination without abnormal findings: Secondary | ICD-10-CM

## 2020-10-27 NOTE — Patient Instructions (Addendum)
Stacy Santana , Thank you for taking time to come for your Medicare Wellness Visit. I appreciate your ongoing commitment to your health goals. Please review the following plan we discussed and let me know if I can assist you in the future.   These are the goals we discussed: Goals    . Follow up with Primary Care Provider     As needed       This is a list of the screening recommended for you and due dates:  Health Maintenance  Topic Date Due  . COVID-19 Vaccine (4 - Booster for Moderna series) 01/03/2021  . Tetanus Vaccine  02/03/2030  . Flu Shot  Completed  . DEXA scan (bone density measurement)  Completed  .  Hepatitis C: One time screening is recommended by Center for Disease Control  (CDC) for  adults born from [birth year] through 1967.   Completed  . Pneumonia vaccines  Completed    Immunizations Immunization History  Administered Date(s) Administered  . Fluad Quad(high Dose 65+) 05/28/2019  . Influenza Whole 06/18/2011, 06/16/2012  . Influenza,inj,Quad PF,6+ Mos 07/21/2013, 06/21/2015  . Influenza-Unspecified 07/20/2014, 05/15/2016, 07/01/2017, 06/30/2018, 05/28/2019, 07/01/2020  . Moderna Sars-Covid-2 Vaccination 11/11/2019, 12/09/2019, 07/05/2020  . Pneumococcal Conjugate-13 04/07/2014  . Pneumococcal Polysaccharide-23 06/21/2015  . Tdap 07/17/2009, 02/04/2020  . Zoster 03/18/2011   Keep all routine maintenance appointments.   Follow up 11/11/20 @ 11:00  Advanced directives: End of life planning; Advance aging; Advanced directives discussed.  Copy of current HCPOA/Living Will requested.  Currently in [facility] chart.   Conditions/risks identified: none new.  Follow up in one year for your annual wellness visit.   Preventive Care 72 Years and Older, Female Preventive care refers to lifestyle choices and visits with your health care provider that can promote health and wellness. What does preventive care include?  A yearly physical exam. This is also called an annual well  check.  Dental exams once or twice a year.  Routine eye exams. Ask your health care provider how often you should have your eyes checked.  Personal lifestyle choices, including:  Daily care of your teeth and gums.  Regular physical activity.  Eating a healthy diet.  Avoiding tobacco and drug use.  Limiting alcohol use.  Practicing safe sex.  Taking low-dose aspirin every day.  Taking vitamin and mineral supplements as recommended by your health care provider. What happens during an annual well check? The services and screenings done by your health care provider during your annual well check will depend on your age, overall health, lifestyle risk factors, and family history of disease. Counseling  Your health care provider may ask you questions about your:  Alcohol use.  Tobacco use.  Drug use.  Emotional well-being.  Home and relationship well-being.  Sexual activity.  Eating habits.  History of falls.  Memory and ability to understand (cognition).  Work and work Statistician.  Reproductive health. Screening  You may have the following tests or measurements:  Height, weight, and BMI.  Blood pressure.  Lipid and cholesterol levels. These may be checked every 5 years, or more frequently if you are over 65 years old.  Skin check.  Lung cancer screening. You may have this screening every year starting at age 31 if you have a 30-pack-year history of smoking and currently smoke or have quit within the past 15 years.  Fecal occult blood test (FOBT) of the stool. You may have this test every year starting at age 17.  Flexible sigmoidoscopy or  colonoscopy. You may have a sigmoidoscopy every 5 years or a colonoscopy every 10 years starting at age 46.  Hepatitis C blood test.  Hepatitis B blood test.  Sexually transmitted disease (STD) testing.  Diabetes screening. This is done by checking your blood sugar (glucose) after you have not eaten for a while  (fasting). You may have this done every 1-3 years.  Bone density scan. This is done to screen for osteoporosis. You may have this done starting at age 71.  Mammogram. This may be done every 1-2 years. Talk to your health care provider about how often you should have regular mammograms. Talk with your health care provider about your test results, treatment options, and if necessary, the need for more tests. Vaccines  Your health care provider may recommend certain vaccines, such as:  Influenza vaccine. This is recommended every year.  Tetanus, diphtheria, and acellular pertussis (Tdap, Td) vaccine. You may need a Td booster every 10 years.  Zoster vaccine. You may need this after age 79.  Pneumococcal 13-valent conjugate (PCV13) vaccine. One dose is recommended after age 48.  Pneumococcal polysaccharide (PPSV23) vaccine. One dose is recommended after age 26. Talk to your health care provider about which screenings and vaccines you need and how often you need them. This information is not intended to replace advice given to you by your health care provider. Make sure you discuss any questions you have with your health care provider. Document Released: 10/14/2015 Document Revised: 06/06/2016 Document Reviewed: 07/19/2015 Elsevier Interactive Patient Education  2017 [place] Prevention in the Home Falls can cause injuries. They can happen to people of all ages. There are many things you can do to make your home safe and to help prevent falls. What can I do on the outside of my home?  Regularly fix the edges of walkways and driveways and fix any cracks.  Remove anything that might make you trip as you walk through a door, such as a raised step or threshold.  Trim any bushes or trees on the path to your home.  Use bright outdoor lighting.  Clear any walking paths of anything that might make someone trip, such as rocks or tools.  Regularly check to see if handrails are loose  or broken. Make sure that both sides of any steps have handrails.  Any raised decks and porches should have guardrails on the edges.  Have any leaves, snow, or ice cleared regularly.  Use sand or salt on walking paths during winter.  Clean up any spills in your garage right away. This includes oil or grease spills. What can I do in the bathroom?  Use night lights.  Install grab bars by the toilet and in the tub and shower. Do not use towel bars as grab bars.  Use non-skid mats or decals in the tub or shower.  If you need to sit down in the shower, use a plastic, non-slip stool.  Keep the floor dry. Clean up any water that spills on the floor as soon as it happens.  Remove soap buildup in the tub or shower regularly.  Attach bath mats securely with double-sided non-slip rug tape.  Do not have throw rugs and other things on the floor that can make you trip. What can I do in the bedroom?  Use night lights.  Make sure that you have a light by your bed that is easy to reach.  Do not use any sheets or blankets that are too  big for your bed. They should not hang down onto the floor.  Have a firm chair that has side arms. You can use this for support while you get dressed.  Do not have throw rugs and other things on the floor that can make you trip. What can I do in the kitchen?  Clean up any spills right away.  Avoid walking on wet floors.  Keep items that you use a lot in easy-to-reach places.  If you need to reach something above you, use a strong step stool that has a grab bar.  Keep electrical cords out of the way.  Do not use floor polish or wax that makes floors slippery. If you must use wax, use non-skid floor wax.  Do not have throw rugs and other things on the floor that can make you trip. What can I do with my stairs?  Do not leave any items on the stairs.  Make sure that there are handrails on both sides of the stairs and use them. Fix handrails that are  broken or loose. Make sure that handrails are as long as the stairways.  Check any carpeting to make sure that it is firmly attached to the stairs. Fix any carpet that is loose or worn.  Avoid having throw rugs at the top or bottom of the stairs. If you do have throw rugs, attach them to the floor with carpet tape.  Make sure that you have a light switch at the top of the stairs and the bottom of the stairs. If you do not have them, ask someone to add them for you. What else can I do to help prevent falls?  Wear shoes that:  Do not have high heels.  Have rubber bottoms.  Are comfortable and fit you well.  Are closed at the toe. Do not wear sandals.  If you use a stepladder:  Make sure that it is fully opened. Do not climb a closed stepladder.  Make sure that both sides of the stepladder are locked into place.  Ask someone to hold it for you, if possible.  Clearly mark and make sure that you can see:  Any grab bars or handrails.  First and last steps.  Where the edge of each step is.  Use tools that help you move around (mobility aids) if they are needed. These include:  Canes.  Walkers.  Scooters.  Crutches.  Turn on the lights when you go into a dark area. Replace any light bulbs as soon as they burn out.  Set up your furniture so you have a clear path. Avoid moving your furniture around.  If any of your floors are uneven, fix them.  If there are any pets around you, be aware of where they are.  Review your medicines with your doctor. Some medicines can make you feel dizzy. This can increase your chance of falling. Ask your doctor what other things that you can do to help prevent falls. This information is not intended to replace advice given to you by your health care provider. Make sure you discuss any questions you have with your health care provider. Document Released: 07/14/2009 Document Revised: 02/23/2016 Document Reviewed: 10/22/2014 Elsevier  Interactive Patient Education  2017 [employer].

## 2020-10-27 NOTE — Progress Notes (Addendum)
Subjective:   Stacy Santana is a 70 y.o. female who presents for Medicare Annual (Subsequent) preventive examination.  Review of Systems    No ROS.  Medicare Wellness Virtual Visit.   Cardiac Risk Factors include: advanced age (>76men, >29 women);hypertension     Objective:    Today's Vitals   10/27/20 1104  Weight: 140 lb (63.5 kg)  Height: 5\' 2"  (1.575 m)   Body mass index is 25.61 kg/m.  Advanced Directives 10/27/2020 10/16/2019 06/21/2015 01/31/2015  Does Patient Have a Medical Advance Directive? Yes Yes Yes Yes  Type of Paramedic of [place];Living will [place];Living will [place];Living will [place]  Does patient want to make changes to medical advance directive? No - Patient declined No - Patient declined No - Patient declined -  Copy of [place] in Chart? No - copy requested No - copy requested No - copy requested -    Current Medications (verified) Outpatient Encounter Medications as of 10/27/2020  Medication Sig   azelastine (ASTELIN) 0.1 % nasal spray PLACE 1 SPRAY INTO BOTH NOSTRILS AT BEDTIME AS NEEDED   buPROPion (WELLBUTRIN XL) 150 MG 24 hr tablet Take 1 tablet (150 mg total) by mouth daily. Increase to 2 tablets every 2 weeks   clonazePAM (KLONOPIN) 0.5 MG tablet Take 1 tablet (0.5 mg total) by mouth 2 (two) times daily as needed for anxiety.   cyanocobalamin (,VITAMIN B-12,) 1000 MCG/ML injection Inject 1 mL into skin once a week for four weeks.   levothyroxine (SYNTHROID) 75 MCG tablet TAKE 1 TABLET(75 MCG) BY MOUTH DAILY BEFORE BREAKFAST   loperamide (IMODIUM) 2 MG capsule Take 4 mg by mouth once.    loratadine (CLARITIN) 10 MG tablet Take 10 mg by mouth daily.     metoprolol succinate (TOPROL-XL) 25 MG 24 hr tablet Take 3 tablets (75 mg total) by mouth daily. Take with or immediately following a meal.   Multiple Vitamin (MULTIVITAMIN) tablet Take 1 tablet  by mouth daily.   ondansetron (ZOFRAN) 8 MG tablet Take 8 mg by mouth every 8 (eight) hours as needed.    prochlorperazine (COMPAZINE) 5 MG tablet    No facility-administered encounter medications on file as of 10/27/2020.    Allergies (verified) Amlodipine and Codeine   History: Past Medical History:  Diagnosis Date   Allergy    seasonal   Carpal tunnel syndrome    Degenerative joint disease    Depression    Endometriosis 1989   small amount   Glaucoma    left eye, Dr. Morrison Old in [place]   HSV infection    Hypertension    Osteoporosis    osteopenia, Fosamax then Boniva 2009   Vitamin D deficiency    on supplements   Past Surgical History:  Procedure Laterality Date   APPENDECTOMY  1964   open   CARPAL TUNNEL RELEASE Right 01/2014   Dr. Malvin Johns   FOOT NEUROMA SURGERY  May 2016   Left foot   [place]  2012   Dr. Pryor Ochoa   THROAT SURGERY  05/02/2018   TONSILLECTOMY     TUBAL LIGATION  1992   Family History  Problem Relation Age of Onset   Osteoporosis Mother        secondary to steroids   Rheum arthritis Mother    Hypertension Mother    COPD Mother    Arthritis Mother  Stroke Mother    Cancer Father        prostate   Depression Son    Anxiety disorder Son    ADD / ADHD Son    ADD / ADHD Son    Hypertrophic cardiomyopathy Sister    Schizophrenia Paternal Aunt    Heart disease Maternal Grandmother    COPD Maternal Grandfather    Depression Maternal Uncle    Social History   Socioeconomic History   Marital status: Married    Spouse name: Not on file   Number of children: Not on file   Years of education: Not on file   Highest education level: Not on file  Occupational History   Not on file  Tobacco Use   Smoking status: Former Smoker    Packs/day: 1.00    Years: 16.00    Pack years: 16.00    Types: Cigarettes    Quit date: 10/02/1983    Years since quitting: 37.0   Smokeless tobacco: Never Used  Substance and Sexual  Activity   Alcohol use: Yes    Comment: occassionally   Drug use: No   Sexual activity: Yes  Other Topics Concern   Not on file  Social History Narrative   Not on file   Social Determinants of Health   Financial Resource Strain: Not on file  Food Insecurity: No Food Insecurity   Worried About Running Out of Food in the Last Year: Never true   [place] in the Last Year: Never true  Transportation Needs: No Transportation Needs   Lack of Transportation (Medical): No   Lack of Transportation (Non-Medical): No  Physical Activity: Not on file  Stress: No Stress Concern Present   Feeling of Stress : Not at all  Social Connections: Not on file    Tobacco Counseling Counseling given: Not Answered   Clinical Intake:  Pre-visit preparation completed: Yes        Diabetes: No  How often do you need to have someone help you when you read instructions, pamphlets, or other written materials from your doctor or pharmacy?: 1 - Never   Interpreter Needed?: No      Activities of Daily Living In your present state of health, do you have any difficulty performing the following activities: 10/27/2020  Hearing? N  Vision? N  Difficulty concentrating or making decisions? N  Walking or climbing stairs? N  Dressing or bathing? N  Doing errands, shopping? N  Preparing Food and eating ? N  Using the Toilet? N  In the past six months, have you accidently leaked urine? N  Do you have problems with loss of bowel control? N  Managing your Medications? N  Managing your Finances? N  Housekeeping or managing your Housekeeping? Y  Comment Husband assist as needed  Some recent data might be hidden    Patient Care Team: Crecencio Mc, MD as PCP - General (Internal Medicine)  Indicate any recent Medical Services you may have received from other than [facility] providers in the past year (date may be approximate).     Assessment:   This is a routine wellness examination for  Stacy Santana.  I connected with Stacy Santana today by telephone and verified that I am speaking with the correct person using two identifiers. Location patient: home Location provider: work Persons participating in the virtual visit: patient, Marine scientist.    I discussed the limitations, risks, security and privacy concerns of performing an evaluation and management service by telephone  and the availability of in person appointments. The patient expressed understanding and verbally consented to this telephonic visit.    Interactive audio and video telecommunications were attempted between this provider and patient, however failed, due to patient having technical difficulties OR patient did not have access to video capability.  We continued and completed visit with audio only.  Some vital signs may be absent or patient reported.   Hearing/Vision screen  Hearing Screening   125Hz  250Hz  500Hz  1000Hz  2000Hz  3000Hz  4000Hz  6000Hz  8000Hz   Right ear:           Left ear:           Comments:  Patient is able to hear conversational tones without difficulty. No issues reported.  Vision Screening Comments: Wears corrective lenses  Followed by Dr. Kathyrn Lass Visual acuity not assessed, virtual visit.    Dietary issues and exercise activities discussed:    Healthy diet Good water intake  Goals      Follow up with Primary Care Provider     As needed       Depression Screen [facility] 2/9 Scores 10/27/2020 05/14/2020 02/08/2020 11/07/2019 10/16/2019 12/06/2017 08/27/2016  PHQ - 2 Score 0 1 0 1 0 0 0  PHQ- 9 Score - 2 - 4 - 4 -  Exception Documentation Other- indicate reason in comment box - - - - - -  Not completed Followed by therapist/counselor every 1-2 months and as needed - - - - - -    Fall Risk Fall Risk  10/27/2020 08/10/2020 06/01/2020 05/12/2020 02/05/2020  Falls in the past year? 0 1 - 1 1  Comment - - - - -  Number falls in past yr: 0 0 - 0 0  Injury with Fall? 0 0 - 1 1  Risk for fall due to : - History of  fall(s) - History of fall(s) -  Follow up Falls evaluation completed Falls evaluation completed Falls evaluation completed Falls evaluation completed Falls evaluation completed    [place]: Handrails in use when climbing stairs? Yes Home free of loose throw rugs in walkways, pet beds, electrical cords, etc? Yes  Adequate lighting in your home to reduce risk of falls? Yes   ASSISTIVE DEVICES UTILIZED TO PREVENT FALLS: Life alert? No  Use of a cane, walker or w/c? No   TIMED UP AND GO: Was the test performed? No .   Cognitive Function: Patient is alert and oriented x3.  Enjoys puzzles and other brain health activities.  MSE.6CIT deferred.Normal by direct communication/observation.  MMSE - Mini Mental State Exam 06/21/2015  Orientation to time 5  Orientation to Place 5  Registration 3  Attention/ Calculation 5  Recall 3  Language- name 2 objects 2  Language- repeat 1  Language- follow 3 step command 3  Language- read & follow direction 1  Write a sentence 1  Copy design 1  Total score 30     6CIT Screen 10/16/2019  What Year? 0 points  What month? 0 points  What time? 0 points  Count back from 20 0 points  Months in reverse 0 points  Repeat phrase 0 points  Total Score 0    Immunizations Immunization History  Administered Date(s) Administered   Fluad Quad(high Dose 65+) 05/28/2019   Influenza Whole 06/18/2011, 06/16/2012   Influenza,inj,Quad PF,6+ Mos 07/21/2013, 06/21/2015   Influenza-Unspecified 07/20/2014, 05/15/2016, 07/01/2017, 06/30/2018, 05/28/2019, 07/01/2020   Moderna Sars-Covid-2 Vaccination 11/11/2019, 12/09/2019, 07/05/2020   Pneumococcal Conjugate-13  04/07/2014   Pneumococcal Polysaccharide-23 06/21/2015   Tdap 07/17/2009, 02/04/2020   Zoster 03/18/2011   Health Maintenance There are no preventive care reminders to display for this patient.  Health Maintenance  Topic Date Due   COVID-19 Vaccine (4 - Booster for  Moderna series) 01/03/2021   TETANUS/TDAP  02/03/2030   INFLUENZA VACCINE  Completed   DEXA SCAN  Completed   Hepatitis C Screening  Completed   PNA vac Low Risk Adult  Completed   Colorectal cancer screening: No longer required.   Mammogram- no longer required.    Bone Density status: Completed 01/08/17. Results reflect: Bone density results: OSTEOPOROSIS. Repeat every 2 years.  Hepatitis C Screening: Completed 06/21/15.  Dental Screening: Recommended annual dental exams for proper oral hygiene. Visits every 6 months.   Community Resource Referral / Chronic Care Management: CRR required this visit?  No   CCM required this visit?  No      Plan:   Keep all routine maintenance appointments.   Follow up 11/11/20 @ 11:00  I have personally reviewed and noted the following in the patient's chart:   Medical and social history Use of alcohol, tobacco or illicit drugs  Current medications and supplements Functional ability and status Nutritional status Physical activity Advanced directives List of other physicians Hospitalizations, surgeries, and ER visits in previous 12 months Vitals Screenings to include cognitive, depression, and falls Referrals and appointments  In addition, I have reviewed and discussed with patient certain preventive protocols, quality metrics, and best practice recommendations. A written personalized care plan for preventive services as well as general preventive health recommendations were provided to patient via mychart.     OBrien-Blaney, Quade Ramirez L, LPN   1/32/4401    I have reviewed the above information and agree with above.   Deborra Medina, MD

## 2020-11-01 ENCOUNTER — Other Ambulatory Visit: Admit: 2020-11-01 | Discharge: 2020-11-01 | Payer: MEDICARE

## 2020-11-01 ENCOUNTER — Ambulatory Visit: Admit: 2020-11-01 | Discharge: 2020-11-01 | Payer: MEDICARE

## 2020-11-01 ENCOUNTER — Ambulatory Visit: Admit: 2020-11-01 | Discharge: 2020-11-01 | Payer: MEDICARE | Attending: Adult Health | Primary: Adult Health

## 2020-11-01 DIAGNOSIS — K5900 Constipation, unspecified: Principal | ICD-10-CM

## 2020-11-01 DIAGNOSIS — R9389 Abnormal findings on diagnostic imaging of other specified body structures: Principal | ICD-10-CM

## 2020-11-01 DIAGNOSIS — R530 Neoplastic (malignant) related fatigue: Principal | ICD-10-CM

## 2020-11-01 DIAGNOSIS — Z515 Encounter for palliative care: Principal | ICD-10-CM

## 2020-11-01 DIAGNOSIS — C3490 Malignant neoplasm of unspecified part of unspecified bronchus or lung: Principal | ICD-10-CM

## 2020-11-01 MED ORDER — FLUCONAZOLE 150 MG TABLET
ORAL_TABLET | Freq: Once | ORAL | 0 refills | 1.00000 days | Status: CP
Start: 2020-11-01 — End: 2020-11-01

## 2020-11-08 ENCOUNTER — Ambulatory Visit: Admit: 2020-11-08 | Discharge: 2020-11-09 | Payer: MEDICARE | Attending: Adult Health | Primary: Adult Health

## 2020-11-08 ENCOUNTER — Ambulatory Visit: Admit: 2020-11-08 | Discharge: 2020-11-09 | Payer: MEDICARE

## 2020-11-08 ENCOUNTER — Other Ambulatory Visit: Admit: 2020-11-08 | Discharge: 2020-11-09 | Payer: MEDICARE

## 2020-11-08 DIAGNOSIS — C3492 Malignant neoplasm of unspecified part of left bronchus or lung: Principal | ICD-10-CM

## 2020-11-08 DIAGNOSIS — Z87891 Personal history of nicotine dependence: Principal | ICD-10-CM

## 2020-11-08 DIAGNOSIS — Z5111 Encounter for antineoplastic chemotherapy: Principal | ICD-10-CM

## 2020-11-08 DIAGNOSIS — R9389 Abnormal findings on diagnostic imaging of other specified body structures: Principal | ICD-10-CM

## 2020-11-08 DIAGNOSIS — I10 Essential (primary) hypertension: Principal | ICD-10-CM

## 2020-11-08 DIAGNOSIS — Z5112 Encounter for antineoplastic immunotherapy: Principal | ICD-10-CM

## 2020-11-08 DIAGNOSIS — C3490 Malignant neoplasm of unspecified part of unspecified bronchus or lung: Principal | ICD-10-CM

## 2020-11-08 DIAGNOSIS — Z79899 Other long term (current) drug therapy: Principal | ICD-10-CM

## 2020-11-10 ENCOUNTER — Other Ambulatory Visit: Payer: Self-pay | Admitting: Internal Medicine

## 2020-11-11 ENCOUNTER — Ambulatory Visit: Payer: Medicare Other | Admitting: Internal Medicine

## 2020-11-17 ENCOUNTER — Ambulatory Visit: Admit: 2020-11-17 | Discharge: 2020-11-18 | Payer: MEDICARE

## 2020-11-25 ENCOUNTER — Ambulatory Visit (INDEPENDENT_AMBULATORY_CARE_PROVIDER_SITE_OTHER): Payer: Medicare Other | Admitting: Internal Medicine

## 2020-11-25 ENCOUNTER — Other Ambulatory Visit: Payer: Self-pay

## 2020-11-25 ENCOUNTER — Encounter: Payer: Self-pay | Admitting: Internal Medicine

## 2020-11-25 VITALS — BP 126/78 | HR 102 | Temp 98.7°F | Ht 62.01 in | Wt 143.0 lb

## 2020-11-25 DIAGNOSIS — I10 Essential (primary) hypertension: Secondary | ICD-10-CM

## 2020-11-25 DIAGNOSIS — F32A Depression, unspecified: Secondary | ICD-10-CM

## 2020-11-25 DIAGNOSIS — C3492 Malignant neoplasm of unspecified part of left bronchus or lung: Secondary | ICD-10-CM | POA: Diagnosis not present

## 2020-11-25 DIAGNOSIS — R634 Abnormal weight loss: Secondary | ICD-10-CM | POA: Diagnosis not present

## 2020-11-25 DIAGNOSIS — F419 Anxiety disorder, unspecified: Secondary | ICD-10-CM

## 2020-11-25 DIAGNOSIS — N1832 Chronic kidney disease, stage 3b: Secondary | ICD-10-CM

## 2020-11-25 MED ORDER — FLUCONAZOLE 150 MG PO TABS: 150.0000 mg | ORAL_TABLET | Freq: Every day | ORAL | 2 refills | Status: DC

## 2020-11-25 NOTE — Assessment & Plan Note (Addendum)
Taking 75 mg toprol xl and home readings are < 140/80. Recommend treating prn  readings at home that are 701 systolic or higher  With an extra 25 mg metoprolol,  Or 2.5 mg amlodipine if pulse is  < 65

## 2020-11-25 NOTE — Patient Instructions (Addendum)
I would not treat your BP more aggressively unless your HOME readings are > 150/80  Take an extra metoprolol for this.    We can add amlodipine 2.5 mg as an alternative if needed (if pulse is < 65)

## 2020-11-25 NOTE — Progress Notes (Signed)
Subjective:  Patient ID: Stacy Santana, female    DOB: 1951-07-12  Age: 70 y.o. MRN: [id number]  CC: The primary encounter diagnosis was Stage 3b chronic kidney disease ([place]). Diagnoses of Primary hypertension, Weight loss of more than 10% body weight, Adenocarcinoma of lung, stage 4, left ([place]), and Anxiety and depression were also pertinent to this visit.  HPI Stacy Santana presents for follow up on hypertension with CKD, .depression/anxiety and insomnia.  Aggravated by lung CA , stage 4  Under treatment for mets to right lung with  Cycle 4 of C4D1 last infusion Feb 8   This visit occurred during the SARS-CoV-2 public health emergency.  Safety protocols were in place, including screening questions prior to the visit, additional usage of staff PPE, and extensive cleaning of exam room while observing appropriate contact time as indicated for disinfecting solutions.   Patient is taking her medications as prescribed and notes no adverse effects.  Home BP readings have been done about once per week , prior to her infusions and are  generally > 140/80 . Her home readings are lower . She is avoiding added salt in her diet .  She has developed exercise intolerance but is  walking regularly about 3 times per week for exercise. She is concerned about letting her readings go above 140 and the cumulative effect on her GFR   Depression/anxiety: The patient feels generally well,  And  denies any feelings of hopelessness or anhedonia.  No panic attacks.  Patient denies any active or passive suicidal thoughts or psychosis.  Appetite is good.  Denies any marital conflict .  She is fully engaged in the decisions and discussion with her oncologists and has a realistic understanding of her prognosis if the current regimen does not slow the progression of her bulky right lung disease.   She has several goals she would like to reach over the next year which involve milestones in her family.  She is sleeping well. Her  appetite has improved since the chemotherapeutic agent that was causing her nausea has been stopped. Her weight has stabiized.   Taking 2000 mg american ginseng for fatigue (and b12)    Outpatient Medications Prior to Visit  Medication Sig Dispense Refill  . azelastine (ASTELIN) 0.1 % nasal spray PLACE 1 SPRAY INTO BOTH NOSTRILS AT BEDTIME AS NEEDED 30 mL 6  . buPROPion (WELLBUTRIN XL) 150 MG 24 hr tablet Take 1 tablet (150 mg total) by mouth daily. Increase to 2 tablets every 2 weeks 111 tablet 1  . clonazePAM (KLONOPIN) 0.5 MG tablet Take 1 tablet (0.5 mg total) by mouth 2 (two) times daily as needed for anxiety. 60 tablet 5  . cyanocobalamin (,VITAMIN B-12,) 1000 MCG/ML injection Inject 1 mL into skin once a week for four weeks. 4 mL 0  . dexamethasone (DECADRON) 4 MG tablet Take by mouth.    . folic acid (FOLVITE) 536 MCG tablet Take by mouth.    . levothyroxine (SYNTHROID) 75 MCG tablet TAKE 1 TABLET(75 MCG) BY MOUTH DAILY BEFORE BREAKFAST 90 tablet 0  . loperamide (IMODIUM) 2 MG capsule Take 4 mg by mouth once.     . loratadine (CLARITIN) 10 MG tablet Take 10 mg by mouth daily.      . metoprolol succinate (TOPROL-XL) 25 MG 24 hr tablet Take 3 tablets (75 mg total) by mouth daily. Take with or immediately following a meal. 270 tablet 1  . Multiple Vitamin (MULTIVITAMIN) tablet Take 1 tablet by  mouth daily.    . ondansetron (ZOFRAN) 8 MG tablet Take 8 mg by mouth every 8 (eight) hours as needed.     . prochlorperazine (COMPAZINE) 5 MG tablet      No facility-administered medications prior to visit.    Review of Systems;  Patient denies headache, fevers, malaise, unintentional weight loss, skin rash, eye pain, sinus congestion and sinus pain, sore throat, dysphagia,  hemoptysis , cough, dyspnea, wheezing, chest pain, palpitations, orthopnea, edema, abdominal pain, nausea, melena, diarrhea, constipation, flank pain, dysuria, hematuria, urinary  Frequency, nocturia, numbness, tingling,  seizures,  Focal weakness, Loss of consciousness,  Tremor, insomnia, depression, anxiety, and suicidal ideation.      Objective:  BP 126/78 (BP Location: Left Arm, Patient Position: Sitting)   Pulse (!) 102   Temp 98.7 F (37.1 C)   Ht 5' 2.01" (1.575 m)   Wt 143 lb (64.9 kg)   SpO2 98%   BMI 26.15 kg/m   BP Readings from Last 3 Encounters:  11/25/20 126/78  05/12/20 128/72  02/05/20 120/82    Wt Readings from Last 3 Encounters:  11/25/20 143 lb (64.9 kg)  10/27/20 140 lb (63.5 kg)  08/10/20 140 lb (63.5 kg)    General appearance: alert, cooperative and appears stated age.  She has lost her hair  Ears: normal TM's and external ear canals both ears Throat: lips, mucosa, and tongue normal; teeth and gums normal Neck: no adenopathy, no carotid bruit, supple, symmetrical, trachea midline and thyroid not enlarged, symmetric, no tenderness/mass/nodules Back: symmetric, no curvature. ROM normal. No CVA tenderness. Lungs: clear to auscultation bilaterally Heart: regular rate and rhythm, S1, S2 normal, no murmur, click, rub or gallop Abdomen: soft, non-tender; bowel sounds normal; no masses,  no organomegaly Pulses: 2+ and symmetric Skin: Skin color, texture, turgor normal. No rashes or lesions Lymph nodes: Cervical, supraclavicular, and axillary nodes normal.  Lab Results  Component Value Date   HGBA1C 5.0 08/16/2020   HGBA1C 5.6 06/01/2020   HGBA1C 5.2 01/26/2020    Lab Results  Component Value Date   CREATININE 1.34 (H) 08/16/2020   CREATININE 1.34 (H) 08/16/2020   CREATININE 1.4 (A) 08/01/2020    Lab Results  Component Value Date   WBC 7.1 05/28/2019   HGB 12.6 05/28/2019   HCT 36.7 05/28/2019   PLT 316.0 05/28/2019   GLUCOSE 88 08/16/2020   GLUCOSE 88 08/16/2020   CHOL 141 06/01/2020   TRIG 147.0 06/01/2020   HDL 43.70 06/01/2020   LDLDIRECT 91.0 11/05/2016   LDLCALC 68 06/01/2020   ALT 17 08/16/2020   AST 24 08/16/2020   NA 136 08/16/2020   NA 136  08/16/2020   K 4.0 08/16/2020   K 4.0 08/16/2020   CL 102 08/16/2020   CL 102 08/16/2020   CREATININE 1.34 (H) 08/16/2020   CREATININE 1.34 (H) 08/16/2020   BUN 17 08/16/2020   BUN 17 08/16/2020   CO2 26 08/16/2020   CO2 26 08/16/2020   TSH 0.74 08/16/2020   HGBA1C 5.0 08/16/2020   MICROALBUR 3.4 (H) 09/09/2018    DG Bone Density  Result Date: 01/08/2017 EXAM: DUAL X-RAY ABSORPTIOMETRY (DXA) FOR BONE MINERAL DENSITY IMPRESSION: Dear Dr. Derrel Nip, Your patient Shabana Armentrout completed a BMD test on 01/08/2017 using the Gantt (analysis version: 14.10) manufactured by [employer]. The following summarizes the results of our evaluation. PATIENT BIOGRAPHICAL: Name: Oval, Moralez Patient ID: [id number] Birth Date: [birth year] Height: 62.0 in. Gender: Female Exam Date: 01/08/2017 Weight:  176.0 lbs. Indications: Caucasian, Family Hx of Osteoporosis, lung cancer, Postmenopausal, Previous Chemo and Radiation Fractures: Treatments: claritin, LEVOTHYROXINE, Vitamin D ASSESSMENT: The BMD measured at Femur Neck Left is 0.826 g/cm2 with a T-score of -1.5. This patient is considered osteopenic according to [facility]) criteria. Site Region Measured Measured WHO Young Adult BMD Date       Age      Classification T-score AP Spine L1-L4 01/08/2017 67.9 Normal -0.1 1.177 g/cm2 AP Spine L1-L4 01/19/2013 64.0 Normal -0.1 1.180 g/cm2 AP Spine L1-L4 01[age]24/2010 61.0 Normal -0.3 1.158 g/cm2 AP Spine L1-L4 01/24/2010 61.0 Normal -0.3 1.158 g/cm2 DualFemur Neck Left 01/08/2017 67.9 Osteopenia -1.5 0.826 g/cm2 DualFemur Neck Left 01/19/2013 64.0 Osteopenia -1.2 0.870 g/cm2 DualFemur Neck Left 01/24/2010 61.0 Normal -1.0 0.904 g/cm2 DualFemur Neck Left 01/24/2010 61.0 Normal -1.0 0.904 g/cm2 World Health Organization [facility]) criteria for post-menopausal, Caucasian Women: Normal:       T-score at or above -1 SD Osteopenia:   T-score between -1 and -2.5 SD Osteoporosis: T-score at or below -2.5 SD  RECOMMENDATIONS: [place] recommends that FDA-approved medical therapies be considered in postmenopausal women and men age 23 or older with a: 1. Hip or vertebral (clinical or morphometric) fracture. 2. T-score of < -2.5 at the spine or hip. 3. Ten-year fracture probability by FRAX of 3% or greater for hip fracture or 20% or greater for major osteoporotic fracture. All treatment decisions require clinical judgment and consideration of individual patient factors, including patient preferences, co-morbidities, previous drug use, risk factors not captured in the FRAX model (e.g. falls, vitamin D deficiency, increased bone turnover, interval significant decline in bone density) and possible under - or over-estimation of fracture risk by FRAX. All patients should ensure an adequate intake of dietary calcium (1200 mg/d) and vitamin D (800 IU daily) unless contraindicated. FOLLOW-UP: People with diagnosed cases of osteoporosis or at high risk for fracture should have regular bone mineral density tests. For patients eligible for Medicare, routine testing is allowed once every 2 years. The testing frequency can be increased to one year for patients who have rapidly progressing disease, those who are receiving or discontinuing medical therapy to restore bone mass, or have additional risk factors. I have reviewed this report, and agree with the above findings. [facility] Radiology Dear Dr. Derrel Nip, Your patient KANIAH RIZZOLO completed a FRAX assessment on 01/08/2017 using the [place] (analysis version: 14.10) manufactured by [employer]. The following summarizes the results of our evaluation. PATIENT BIOGRAPHICAL: Name: Adaysha, Dubinsky Patient ID: [id number] Birth Date: [birth year] Height:    62.0 in. Gender:     [age]    [age]       Weight:    176.0 lbs. Ethnicity:  White                            Exam Date: 01/08/2017 FRAX* RESULTS:  (version: 3.5) 10-year Probability of  Fracture1 Major Osteoporotic Fracture2 Hip Fracture 9.2% 1.1% Population: [place] (Caucasian) Risk Factors: None Based on Femur (Left) Neck BMD 1 -The 10-year probability of fracture may be lower than reported if the patient has received treatment. 2 -Major Osteoporotic Fracture: Clinical Spine, Forearm, Hip or Shoulder *FRAX is a Materials engineer of the [employer] for Metabolic Bone Disease, a [place] (WHO) [facility]. ASSESSMENT: The probability of a major osteoporotic fracture is 9.2%  within the next ten years. The probability of a hip fracture is 1.1% within the next ten years. . Electronically Signed   By: Dorise Bullion III M.D   On: 01/08/2017 14:46    Assessment & Plan:   Problem List Items Addressed This Visit      Unprioritized   Weight loss of more than 10% body weight    Weigh has been stable since stopping the chemo agent that was causing so much nausea       Hypertension (Chronic)    Taking 75 mg toprol xl and home readings are < 140/80. Recommend treating prn  readings at home that are 665 systolic or higher  With an extra 25 mg metoprolol,  Or 2.5 mg amlodipine if pulse is  < 65      Relevant Orders   AMB Referral to Community Care Coordinaton   CKD (chronic kidney disease) stage 3, GFR 30-59 ml/min (HCC) - Primary   Relevant Orders   AMB Referral to Community Care Coordinaton   Anxiety and depression    Improved symptoms control on current dose of wellbutrin A total of 30 minutes was spent with patient more than half of which was spent in discussion with patient  the above mentioned issues , reviewing and explaining recent labs and imaging studies done, and coordination of care.      Adenocarcinoma of lung, stage 4, left (HCC)   Relevant Medications   dexamethasone (DECADRON) 4 MG tablet   folic acid (FOLVITE) 993 MCG tablet   fluconazole (DIFLUCAN) 150 MG tablet   Other Relevant Orders   AMB Referral to [place]      I am having Lannette Donath. Antunes start on fluconazole. I am also having her maintain her loratadine, multivitamin, azelastine, loperamide, clonazePAM, prochlorperazine, ondansetron, buPROPion, cyanocobalamin, metoprolol succinate, levothyroxine, dexamethasone, and folic acid.  Meds ordered this encounter  Medications  . fluconazole (DIFLUCAN) 150 MG tablet    Sig: Take 1 tablet (150 mg total) by mouth daily.    Dispense:  2 tablet    Refill:  2    There are no discontinued medications.  Follow-up: Return in about 6 months (around 05/25/2021).   Crecencio Mc, MD

## 2020-11-25 NOTE — Assessment & Plan Note (Addendum)
Weigh has been stable since stopping the chemo agent that was causing so much nausea

## 2020-11-27 NOTE — Assessment & Plan Note (Addendum)
Improved symptoms control on current dose of wellbutrin A total of 30 minutes was spent with patient more than half of which was spent in discussion with patient  the above mentioned issues , reviewing and explaining recent labs and imaging studies done, and coordination of care.

## 2020-11-28 ENCOUNTER — Institutional Professional Consult (permissible substitution): Admit: 2020-11-28 | Discharge: 2020-11-29 | Payer: MEDICARE

## 2020-11-28 DIAGNOSIS — C3490 Malignant neoplasm of unspecified part of unspecified bronchus or lung: Principal | ICD-10-CM

## 2020-11-28 DIAGNOSIS — Z515 Encounter for palliative care: Principal | ICD-10-CM

## 2020-11-28 DIAGNOSIS — R5383 Other fatigue: Principal | ICD-10-CM

## 2020-11-29 ENCOUNTER — Ambulatory Visit: Admit: 2020-11-29 | Discharge: 2020-11-30 | Payer: MEDICARE

## 2020-11-29 ENCOUNTER — Ambulatory Visit
Admit: 2020-11-29 | Discharge: 2020-11-30 | Payer: MEDICARE | Attending: Hematology & Oncology | Primary: Hematology & Oncology

## 2020-11-29 ENCOUNTER — Other Ambulatory Visit: Admit: 2020-11-29 | Discharge: 2020-11-30 | Payer: MEDICARE

## 2020-11-29 DIAGNOSIS — C3490 Malignant neoplasm of unspecified part of unspecified bronchus or lung: Principal | ICD-10-CM

## 2020-11-29 DIAGNOSIS — R9389 Abnormal findings on diagnostic imaging of other specified body structures: Principal | ICD-10-CM

## 2020-11-29 DIAGNOSIS — C3492 Malignant neoplasm of unspecified part of left bronchus or lung: Principal | ICD-10-CM

## 2020-12-01 ENCOUNTER — Telehealth: Admit: 2020-12-01 | Discharge: 2020-12-02 | Payer: MEDICARE

## 2020-12-05 ENCOUNTER — Telehealth: Payer: Self-pay | Admitting: *Deleted

## 2020-12-05 NOTE — Chronic Care Management (AMB) (Signed)
  Chronic Care Management   Note  12/05/2020 Name: [name] MRN: [id number] DOB: 1951/02/02  Stacy Santana is a 70 y.o. year old female who is a primary care patient of Derrel Nip, Aris Everts, MD. I reached out to [name] by phone today in response to a referral sent by [name]'s PCP, Dr. Derrel Nip.     [name] was given information about Chronic Care Management services today including:  1. CCM service includes personalized support from designated clinical staff supervised by her physician, including individualized plan of care and coordination with other care providers 2. 24/7 contact phone numbers for assistance for urgent and routine care needs. 3. Service will only be billed when office clinical staff spend 20 minutes or more in a month to coordinate care. 4. Only one practitioner may furnish and bill the service in a calendar month. 5. The patient may stop CCM services at any time (effective at the end of the month) by phone call to the office staff. 6. The patient will be responsible for cost sharing (co-pay) of up to 20% of the service fee (after annual deductible is met).  Patient agreed to services and verbal consent obtained.   Follow up plan: Telephone appointment with care management team member scheduled for:12/14/2020  [facility]

## 2020-12-14 ENCOUNTER — Ambulatory Visit (INDEPENDENT_AMBULATORY_CARE_PROVIDER_SITE_OTHER): Payer: Medicare Other | Admitting: *Deleted

## 2020-12-14 DIAGNOSIS — C3492 Malignant neoplasm of unspecified part of left bronchus or lung: Secondary | ICD-10-CM

## 2020-12-14 DIAGNOSIS — I10 Essential (primary) hypertension: Secondary | ICD-10-CM

## 2020-12-14 NOTE — Chronic Care Management (AMB) (Signed)
Chronic Care Management   CCM RN Visit Note  12/14/2020 Name: Stacy Santana MRN: [id number] DOB: 08-05-1951  Subjective: Stacy Santana is a 70 y.o. year old female who is a primary care patient of Derrel Nip, Aris Everts, MD. The care management team was consulted for assistance with disease management and care coordination needs.    Engaged with patient by telephone for initial visit in response to provider referral for case management and/or care coordination services.   Consent to Services:  The patient was given the following information about Chronic Care Management services today, agreed to services, and gave verbal consent: 1. CCM service includes personalized support from designated clinical staff supervised by the primary care provider, including individualized plan of care and coordination with other care providers 2. 24/7 contact phone numbers for assistance for urgent and routine care needs. 3. Service will only be billed when office clinical staff spend 20 minutes or more in a month to coordinate care. 4. Only one practitioner may furnish and bill the service in a calendar month. 5.The patient may stop CCM services at any time (effective at the end of the month) by phone call to the office staff. 6. The patient will be responsible for cost sharing (co-pay) of up to 20% of the service fee (after annual deductible is met). Patient agreed to services and consent obtained.  Patient agreed to services and verbal consent obtained.   Assessment: Review of patient past medical history, allergies, medications, health status, including review of consultants reports, laboratory and other test data, was performed as part of comprehensive evaluation and provision of chronic care management services.   SDOH (Social Determinants of Health) assessments and interventions performed:  SDOH Interventions   Flowsheet Row Most Recent Value  SDOH Interventions   Food Insecurity Interventions Intervention Not  Indicated  Financial Strain Interventions Intervention Not Indicated  Housing Interventions Intervention Not Indicated  Intimate Partner Violence Interventions Intervention Not Indicated  Social Connections Interventions Intervention Not Indicated  Transportation Interventions Intervention Not Indicated       CCM Care Plan  Allergies  Allergen Reactions  . Amlodipine     edema  . Codeine     Nausea and vomiting    Outpatient Encounter Medications as of 12/14/2020  Medication Sig Note  . AMERICAN GINSENG PO Take 1,000 mg by mouth 2 (two) times daily.   [name] azelastine (ASTELIN) 0.1 % nasal spray PLACE 1 SPRAY INTO BOTH NOSTRILS AT BEDTIME AS NEEDED   . buPROPion (WELLBUTRIN XL) 150 MG 24 hr tablet Take 1 tablet (150 mg total) by mouth daily. Increase to 2 tablets every 2 weeks   . clonazePAM (KLONOPIN) 0.5 MG tablet Take 1 tablet (0.5 mg total) by mouth 2 (two) times daily as needed for anxiety.   [name] dexamethasone (DECADRON) 4 MG tablet Take by mouth. 12/14/2020: Reports taking post Chemo as needed  . fluconazole (DIFLUCAN) 150 MG tablet Take 1 tablet (150 mg total) by mouth daily. 12/14/2020: Reports taking with steroids post chemo infusions  . folic acid (FOLVITE) 154 MCG tablet Take by mouth.   . levothyroxine (SYNTHROID) 75 MCG tablet TAKE 1 TABLET(75 MCG) BY MOUTH DAILY BEFORE BREAKFAST   . loratadine (CLARITIN) 10 MG tablet Take 10 mg by mouth daily.   . metoprolol succinate (TOPROL-XL) 25 MG 24 hr tablet Take 3 tablets (75 mg total) by mouth daily. Take with or immediately following a meal.   . Multiple Vitamin (MULTIVITAMIN) tablet Take 1 tablet by mouth daily.   [name]  ondansetron (ZOFRAN) 8 MG tablet Take 8 mg by mouth every 8 (eight) hours as needed.    . prochlorperazine (COMPAZINE) 5 MG tablet    . vitamin B-12 (CYANOCOBALAMIN) 100 MCG tablet Take 100 mcg by mouth daily.   . cyanocobalamin (,VITAMIN B-12,) 1000 MCG/ML injection Inject 1 mL into skin once a week for four weeks.   [name]  loperamide (IMODIUM) 2 MG capsule Take 4 mg by mouth once.     No facility-administered encounter medications on file as of 12/14/2020.    Patient Active Problem List   Diagnosis Date Noted  . Acute renal failure superimposed on stage 3 chronic kidney disease ([place]) 06/01/2020  . CKD (chronic kidney disease) stage 3, GFR 30-59 ml/min (HCC) 05/14/2020  . Choledocholithiasis 05/14/2020  . Weight loss of more than 10% body weight 05/14/2020  . Fingernail abnormalities 05/12/2020  . Chemotherapy-induced nausea 02/08/2020  . Drug-induced pruritus 11/07/2019  . MRSA colonization 07/27/2019  . Nasal mucositis (ulcerative) 05/25/2019  . Educated about COVID-19 virus infection 02/24/2019  . Left carotid bruit 05/09/2018  . Tachycardia 08/27/2016  . Adenocarcinoma of lung, stage 4, left ([place]) 04/11/2016  . Right shoulder pain 01/06/2015  . Osteoarthritis of both knees 01/06/2015  . Colon cancer screening 04/07/2014  . Screening for breast cancer 07/16/2012  . Anxiety and depression 07/16/2012  . GERD (gastroesophageal reflux disease) 07/16/2012  . Hypothyroidism 01/15/2012  . Pure hypercholesterolemia 12/12/2011  . Endometriosis   . Carpal tunnel syndrome   . Degenerative joint disease   . Hypertension 07/18/2011  . Allergic rhinitis 07/18/2011    Conditions to be addressed/monitored:HTN and Lung Cancer  Care Plan : Hypertension (Adult)  Updates made by Leona Singleton, RN since 12/14/2020 12:00 AM  Problem: Hypertension (Hypertension)   Long-Range Goal: Patient will report monitoring blood pressures at least 3 times a week within the next 90 days.   Start Date: 12/14/2020  Expected End Date: 05/30/2021  Priority: Medium  Objective:  . Last practice recorded BP readings:  BP Readings from Last 3 Encounters:  11/25/20 126/78  05/12/20 128/72  02/05/20 120/82  Current Barriers:  [name] Knowledge Deficits related to basic understanding of hypertension pathophysiology and self care  management as evidenced by patient reporting blood pressure elevations when attending chemotherapy appointments.  Patient reporting she monitors home blood pressures about once a week with typical ranges of 130/70's.  But when she attends her infusion appointments, her blood pressures are usually a lot more elevated.  Denies any symptoms of hypertension at home.  Does report fall yesterday as she was trying to move/turn to fast and lost balance and turned her ankle.  Denies any injuries.   Case Manager Clinical Goal(s):  . patient will verbalize understanding of plan for hypertension management . patient will demonstrate improved adherence to prescribed treatment plan for hypertension as evidenced by taking all medications as prescribed, monitoring and recording blood pressure as directed, adhering to low sodium/DASH diet Interventions:  . Collaboration with Crecencio Mc, MD regarding development and update of comprehensive plan of care as evidenced by provider attestation and co-signature . Inter-disciplinary care team collaboration (see longitudinal plan of care) . Evaluation of current treatment plan related to hypertension self management and patient's adherence to plan as established by provider. . Reviewed medications with patient and discussed importance of compliance . Discussed plans with patient for ongoing care management follow up and provided patient with direct contact information for care management team . Advised patient, providing education and  rationale, to monitor blood pressure at least 3 times a week and record, calling PCP for findings outside established parameters.  . Blood pressure trends reviewed . Encouraged patient to monitor blood pressures at least 3 times a week and record pressures in log for provider review at medical appointments . Discussed heart healthy diet and importance of eating well balanced diet, encouraged to incorporated and eat more vegetables in  diet . EMMI video on hypertension sent Patient Goals/Self-Care Activities: . Check blood pressure 3 times per week . Write blood pressure results in a log for provider review . Notify provider for sustained elevations in blood pressure . Eat more vegetables in you diet Follow Up Plan: The care management team will reach out to the patient again over the next 45 business days.     Care Plan : Cancer Treatment Phase (Adult)  Updates made by Leona Singleton, RN since 12/14/2020 12:00 AM  Problem: Psychosocial Response to continued need for lung cancer treatment   Priority: Medium  Long-Range Goal: Patient will report ease of acknowledging feelings and accepting of help related to cotninued need for chemotherapy realted to lung cancer in the next 90 days.   Start Date: 12/14/2020  Expected End Date: 05/30/2021  Priority: Medium  Current Barriers:   Difficulty Self Health Maintenance related to continued need for chemotherapy related to lung cancer diagnosis.  Patient reporting this is her 5th year of treatment for lung cancer, even after multiple surgeries and rounds of chemo.  Receives chemotherapy every 3 weeks and admits it still wipes her out for a few days after the infusion.  Reports nausea and vomiting is better, but has periods of weakness fatigue around infusion days.  Acknowledges she still gets a little anxious going into chemotherapy treatments.  Admits to frustration with periods of fatigue and having to ask for assistance. Clinical Goal(s):  [name] Collaboration with Crecencio Mc, MD regarding development and update of comprehensive plan of care as evidenced by provider attestation and co-signature . Inter-disciplinary care team collaboration (see longitudinal plan of care)  patient will work with care management team to address care coordination and chronic disease management needs related to emotional support of continued treatment of lung cancer.   Interventions:   Evaluation of  current treatment plan related to  lung cancer ,  self-management and patient's adherence to plan as established by provider.  Collaboration with Crecencio Mc, MD regarding development and update of comprehensive plan of care as evidenced by provider attestation       and co-signature  Inter-disciplinary care team collaboration (see longitudinal plan of care)  Discussed plans with patient for ongoing care management follow up and provided patient with direct contact information for care management team  Counseling provided and decision-making supported  Family involvement promoted and patient encouraged to ask for help when needed and she is not able to complete task  Self-care encouraged and  self-reflection promoted, provided patient with emotional support and empathy  Verbalization of feelings encouraged and encouraged to discuss with family; encouraged patient to continue to seek assistance and support from Palliative team at [place] promoted  Quality of sleep assessed  Relaxation techniques promoted  Discussed periods of depression and continued use of [place] Social Worker, discussed CCM Social Worker resources (patient declines at this time); encouraged patient to notify this RNCM if social work resources needed in the future  Patient [place]: . Eat healthy . Get a  least 8 hours of sleep at night  . Ask for help when needing it (don't be mad at yourself for needing assistance) . Fall precautions and preventions, move slowly, sitting when feeling dizzy Follow Up Plan: The care management team will reach out to the patient again over the next 45 business days.      Plan:The care management team will reach out to the patient again over the next 45 business days.  Hubert Azure RN, MSN RN Care Management Coordinator [place] [phone] [email or web address]

## 2020-12-14 NOTE — Patient Instructions (Signed)
Visit Information  PATIENT GOALS:  Goals Addressed            This Visit's Progress   . (RNCM) Manage Fatigue (Tiredness- Cancer Treatment)       Timeframe:  Long-Range Goal Priority:  Medium Start Date:   12/14/20                          Expected End Date:   05/30/21                    Follow Up Date 01/09/21    . Eat healthy . Get a least 8 hours of sleep at night  . Ask for help when needing it (don't be mad at yourself for needing assistance) . Fall precautions and preventions, move slowly, sitting when feeling dizzy  Why is this important?   Cancer treatment and its side effects can drain your energy. It can keep you from doing things you would like to do.  There are many things that you can do to manage fatigue.    Notes:    Stacy Santana [facility]) Track and Manage My Blood Pressure-Hypertension       Timeframe:  Long-Range Goal Priority:  Medium Start Date:   12/14/20                          Expected End Date:  05/30/21                     Follow Up Date 01/09/21    . Check blood pressure 3 times per week . Write blood pressure results in a log for provider review . Notify provider for sustained elevations in blood pressure . Eat more vegetables in you diet  Why is this important?    You won't feel high blood pressure, but it can still hurt your blood vessels.   High blood pressure can cause heart or kidney problems. It can also cause a stroke.   Making lifestyle changes like losing a little weight or eating less salt will help.   Checking your blood pressure at home and at different times of the day can help to control blood pressure.   If the doctor prescribes medicine remember to take it the way the doctor ordered.   Call the office if you cannot afford the medicine or if there are questions about it.     Notes:     Consent to CCM Services: Ms. Gal was given information about Chronic Care Management services today including:  1. CCM service includes personalized  support from designated clinical staff supervised by her physician, including individualized plan of care and coordination with other care providers 2. 24/7 contact phone numbers for assistance for urgent and routine care needs. 3. Service will only be billed when office clinical staff spend 20 minutes or more in a month to coordinate care. 4. Only one practitioner may furnish and bill the service in a calendar month. 5. The patient may stop CCM services at any time (effective at the end of the month) by phone call to the office staff. 6. The patient will be responsible for cost sharing (co-pay) of up to 20% of the service fee (after annual deductible is met).  Patient agreed to services and verbal consent obtained.   Patient verbalizes understanding of instructions provided today and agrees to view in [place].   The care management team will  reach out to the patient again over the next 45 business days.   Hubert Azure RN, MSN RN Care Management Coordinator [place] [phone] [email or web address]   CLINICAL CARE PLAN: Patient Care Plan: Hypertension (Adult)  Problem Identified: Hypertension (Hypertension)   Long-Range Goal: Patient will report monitoring blood pressures at least 3 times a week within the next 90 days.   Start Date: 12/14/2020  Expected End Date: 05/30/2021  Priority: Medium  Objective:  . Last practice recorded BP readings:  BP Readings from Last 3 Encounters:  11/25/20 126/78  05/12/20 128/72  02/05/20 120/82  Current Barriers:  Stacy Santana Knowledge Deficits related to basic understanding of hypertension pathophysiology and self care management as evidenced by patient reporting blood pressure elevations when attending chemotherapy appointments.  Patient reporting she monitors home blood pressures about once a week with typical ranges of 130/70's.  But when she attends her infusion appointments, her blood pressures are usually a lot  more elevated.  Denies any symptoms of hypertension at home.  Does report fall yesterday as she was trying to move/turn to fast and lost balance and turned her ankle.  Denies any injuries.   Case Manager Clinical Goal(s):  . patient will verbalize understanding of plan for hypertension management . patient will demonstrate improved adherence to prescribed treatment plan for hypertension as evidenced by taking all medications as prescribed, monitoring and recording blood pressure as directed, adhering to low sodium/DASH diet Interventions:  . Collaboration with Crecencio Mc, MD regarding development and update of comprehensive plan of care as evidenced by provider attestation and co-signature . Inter-disciplinary care team collaboration (see longitudinal plan of care) . Evaluation of current treatment plan related to hypertension self management and patient's adherence to plan as established by provider. . Reviewed medications with patient and discussed importance of compliance . Discussed plans with patient for ongoing care management follow up and provided patient with direct contact information for care management team . Advised patient, providing education and rationale, to monitor blood pressure at least 3 times a week and record, calling PCP for findings outside established parameters.  . Blood pressure trends reviewed . Encouraged patient to monitor blood pressures at least 3 times a week and record pressures in log for provider review at medical appointments . Discussed heart healthy diet and importance of eating well balanced diet, encouraged to incorporated and eat more vegetables in diet . EMMI video on hypertension sent Patient Goals/Self-Care Activities: . Check blood pressure 3 times per week . Write blood pressure results in a log for provider review . Notify provider for sustained elevations in blood pressure . Eat more vegetables in you diet Follow Up Plan: The care management  team will reach out to the patient again over the next 45 business days.     Patient Care Plan: Cancer Treatment Phase (Adult)  Problem Identified: Psychosocial Response to continued need for lung cancer treatment   Priority: Medium  Long-Range Goal: Patient will report ease of acknowledging feelings and accepting of help related to cotninued need for chemotherapy realted to lung cancer in the next 90 days.   Start Date: 12/14/2020  Expected End Date: 05/30/2021  Priority: Medium  Current Barriers:   Difficulty Self Health Maintenance related to continued need for chemotherapy related to lung cancer diagnosis.  Patient reporting this is her 5th year of treatment for lung cancer, even after multiple surgeries and rounds of chemo.  Receives chemotherapy every 3 weeks and admits it still wipes her out for  a few days after the infusion.  Reports nausea and vomiting is better, but has periods of weakness fatigue around infusion days.  Acknowledges she still gets a little anxious going into chemotherapy treatments.  Admits to frustration with periods of fatigue and having to ask for assistance. Clinical Goal(s):  Stacy Santana Collaboration with Crecencio Mc, MD regarding development and update of comprehensive plan of care as evidenced by provider attestation and co-signature . Inter-disciplinary care team collaboration (see longitudinal plan of care)  patient will work with care management team to address care coordination and chronic disease management needs related to emotional support of continued treatment of lung cancer.   Interventions:   Evaluation of current treatment plan related to  lung cancer ,  self-management and patient's adherence to plan as established by provider.  Collaboration with Crecencio Mc, MD regarding development and update of comprehensive plan of care as evidenced by provider attestation       and co-signature  Inter-disciplinary care team collaboration (see longitudinal plan  of care)  Discussed plans with patient for ongoing care management follow up and provided patient with direct contact information for care management team  Counseling provided and decision-making supported  Family involvement promoted and patient encouraged to ask for help when needed and she is not able to complete task  Self-care encouraged and  self-reflection promoted, provided patient with emotional support and empathy  Verbalization of feelings encouraged and encouraged to discuss with family; encouraged patient to continue to seek assistance and support from Palliative team at [place] promoted  Quality of sleep assessed  Relaxation techniques promoted  Discussed periods of depression and continued use of [place] Social Worker, discussed CCM Social Worker resources (patient declines at this time); encouraged patient to notify this RNCM if social work resources needed in the future  Patient [place]: . Eat healthy . Get a least 8 hours of sleep at night  . Ask for help when needing it (don't be mad at yourself for needing assistance) . Fall precautions and preventions, move slowly, sitting when feeling dizzy Follow Up Plan: The care management team will reach out to the patient again over the next 45 business days.

## 2020-12-15 MED ORDER — METOPROLOL SUCCINATE ER 25 MG TABLET,EXTENDED RELEASE 24 HR
0.00000 days
Start: 2020-12-15 — End: ?

## 2020-12-16 ENCOUNTER — Ambulatory Visit: Admit: 2020-12-16 | Discharge: 2020-12-17 | Payer: MEDICARE

## 2020-12-16 DIAGNOSIS — N1831 Stage 3a chronic kidney disease (CMS-HCC): Principal | ICD-10-CM

## 2020-12-16 DIAGNOSIS — I10 Essential (primary) hypertension: Principal | ICD-10-CM

## 2020-12-20 ENCOUNTER — Ambulatory Visit: Admit: 2020-12-20 | Discharge: 2020-12-21 | Payer: MEDICARE

## 2020-12-20 ENCOUNTER — Other Ambulatory Visit: Admit: 2020-12-20 | Discharge: 2020-12-21 | Payer: MEDICARE

## 2020-12-20 ENCOUNTER — Ambulatory Visit
Admit: 2020-12-20 | Discharge: 2020-12-21 | Payer: MEDICARE | Attending: Hematology & Oncology | Primary: Hematology & Oncology

## 2020-12-20 DIAGNOSIS — C3492 Malignant neoplasm of unspecified part of left bronchus or lung: Principal | ICD-10-CM

## 2020-12-20 DIAGNOSIS — Z515 Encounter for palliative care: Principal | ICD-10-CM

## 2020-12-20 DIAGNOSIS — R5383 Other fatigue: Principal | ICD-10-CM

## 2020-12-20 DIAGNOSIS — C3490 Malignant neoplasm of unspecified part of unspecified bronchus or lung: Principal | ICD-10-CM

## 2020-12-20 DIAGNOSIS — R9389 Abnormal findings on diagnostic imaging of other specified body structures: Principal | ICD-10-CM

## 2020-12-29 ENCOUNTER — Telehealth: Admit: 2020-12-29 | Discharge: 2020-12-30 | Payer: MEDICARE

## 2021-01-02 DIAGNOSIS — F32A Depression, unspecified: Secondary | ICD-10-CM

## 2021-01-02 MED ORDER — BUPROPION HCL ER (XL) 150 MG PO TB24: 150.0000 mg | ORAL_TABLET | Freq: Every day | ORAL | 1 refills | Status: DC

## 2021-01-03 ENCOUNTER — Ambulatory Visit: Admit: 2021-01-03 | Discharge: 2021-01-03 | Payer: MEDICARE

## 2021-01-03 DIAGNOSIS — C3490 Malignant neoplasm of unspecified part of unspecified bronchus or lung: Principal | ICD-10-CM

## 2021-01-06 ENCOUNTER — Other Ambulatory Visit: Payer: Self-pay

## 2021-01-08 MED ORDER — BUPROPION HCL ER (XL) 150 MG PO TB24
150.0000 mg | ORAL_TABLET | Freq: Every day | ORAL | 1 refills | Status: DC
Start: 2021-01-08 — End: 2021-08-01

## 2021-01-08 NOTE — Addendum Note (Signed)
Addended by: Crecencio Mc on: 01/08/2021 10:21 PM   Modules accepted: Orders

## 2021-01-09 ENCOUNTER — Ambulatory Visit (INDEPENDENT_AMBULATORY_CARE_PROVIDER_SITE_OTHER): Payer: Medicare Other | Admitting: *Deleted

## 2021-01-09 DIAGNOSIS — I10 Essential (primary) hypertension: Secondary | ICD-10-CM | POA: Diagnosis not present

## 2021-01-09 DIAGNOSIS — C3492 Malignant neoplasm of unspecified part of left bronchus or lung: Secondary | ICD-10-CM | POA: Diagnosis not present

## 2021-01-09 NOTE — Patient Instructions (Signed)
Visit Information  PATIENT GOALS: Goals Addressed            This Visit's Progress   . (RNCM) Manage Fatigue (Tiredness- Cancer Treatment)   On track    Timeframe:  Long-Range Goal Priority:  Medium Start Date:   12/14/20                          Expected End Date:   05/30/21                    Follow Up Date 02/08/21    . Eat healthy . Get a least 8 hours of sleep at night  . Ask for help when needing it (don't be mad at yourself for needing assistance) . Fall precautions and preventions, move slowly, sitting when feeling dizzy . Allow yourself time to deal with your emotions  Why is this important?   Cancer treatment and its side effects can drain your energy. It can keep you from doing things you would like to do.  There are many things that you can do to manage fatigue.    Notes:     [name] [facility]) Track and Manage My Blood Pressure-Hypertension   Not on track    Timeframe:  Long-Range Goal Priority:  Medium Start Date:   12/14/20                          Expected End Date:  05/30/21                     Follow Up Date 02/08/21    . Check blood pressure 3 times per week . Write blood pressure results in a log for provider review . Notify provider for sustained elevations in blood pressure . Eat more vegetables in you diet  Why is this important?    You won't feel high blood pressure, but it can still hurt your blood vessels.   High blood pressure can cause heart or kidney problems. It can also cause a stroke.   Making lifestyle changes like losing a little weight or eating less salt will help.   Checking your blood pressure at home and at different times of the day can help to control blood pressure.   If the doctor prescribes medicine remember to take it the way the doctor ordered.   Call the office if you cannot afford the medicine or if there are questions about it.     Notes:        Patient verbalizes understanding of instructions provided today and agrees to  view in [place].   The care management team will reach out to the patient again over the next 45 business days.   Hubert Azure RN, MSN RN Care Management Coordinator [place] [phone] [email or web address]

## 2021-01-09 NOTE — Chronic Care Management (AMB) (Signed)
Chronic Care Management   CCM RN Visit Note  01/09/2021 Name: Stacy Santana MRN: [id number] DOB: 01/10/1951  Subjective: Stacy Santana is a 70 y.o. year old female who is a primary care patient of Derrel Nip, Aris Everts, MD. The care management team was consulted for assistance with disease management and care coordination needs.    Engaged with patient by telephone for follow up visit in response to provider referral for case management and/or care coordination services.   Consent to Services:  The patient was given information about Chronic Care Management services, agreed to services, and gave verbal consent prior to initiation of services.  Please see initial visit note for detailed documentation.   Patient agreed to services and verbal consent obtained.   Assessment: Review of patient past medical history, allergies, medications, health status, including review of consultants reports, laboratory and other test data, was performed as part of comprehensive evaluation and provision of chronic care management services.   SDOH (Social Determinants of Health) assessments and interventions performed:    CCM Care Plan  Allergies  Allergen Reactions  . Amlodipine     edema  . Codeine     Nausea and vomiting    Outpatient Encounter Medications as of 01/09/2021  Medication Sig Note  . AMERICAN GINSENG PO Take 1,000 mg by mouth 2 (two) times daily.   [name] azelastine (ASTELIN) 0.1 % nasal spray PLACE 1 SPRAY INTO BOTH NOSTRILS AT BEDTIME AS NEEDED   . buPROPion (WELLBUTRIN XL) 150 MG 24 hr tablet Take 1 tablet (150 mg total) by mouth daily. I   . clonazePAM (KLONOPIN) 0.5 MG tablet Take 1 tablet (0.5 mg total) by mouth 2 (two) times daily as needed for anxiety.   . cyanocobalamin (,VITAMIN B-12,) 1000 MCG/ML injection Inject 1 mL into skin once a week for four weeks.   [name] dexamethasone (DECADRON) 4 MG tablet Take by mouth. 12/14/2020: Reports taking post Chemo as needed  . fluconazole (DIFLUCAN)  150 MG tablet Take 1 tablet (150 mg total) by mouth daily. 12/14/2020: Reports taking with steroids post chemo infusions  . folic acid (FOLVITE) 924 MCG tablet Take by mouth.   . levothyroxine (SYNTHROID) 75 MCG tablet TAKE 1 TABLET(75 MCG) BY MOUTH DAILY BEFORE BREAKFAST   . loperamide (IMODIUM) 2 MG capsule Take 4 mg by mouth once.    . loratadine (CLARITIN) 10 MG tablet Take 10 mg by mouth daily.   . metoprolol succinate (TOPROL-XL) 25 MG 24 hr tablet Take 3 tablets (75 mg total) by mouth daily. Take with or immediately following a meal.   . Multiple Vitamin (MULTIVITAMIN) tablet Take 1 tablet by mouth daily.   . ondansetron (ZOFRAN) 8 MG tablet Take 8 mg by mouth every 8 (eight) hours as needed.    . prochlorperazine (COMPAZINE) 5 MG tablet    . vitamin B-12 (CYANOCOBALAMIN) 100 MCG tablet Take 100 mcg by mouth daily.    No facility-administered encounter medications on file as of 01/09/2021.    Patient Active Problem List   Diagnosis Date Noted  . Acute renal failure superimposed on stage 3 chronic kidney disease ([place]) 06/01/2020  . CKD (chronic kidney disease) stage 3, GFR 30-59 ml/min (HCC) 05/14/2020  . Choledocholithiasis 05/14/2020  . Weight loss of more than 10% body weight 05/14/2020  . Fingernail abnormalities 05/12/2020  . Chemotherapy-induced nausea 02/08/2020  . Drug-induced pruritus 11/07/2019  . MRSA colonization 07/27/2019  . Nasal mucositis (ulcerative) 05/25/2019  . Educated about COVID-19 virus infection 02/24/2019  .  Left carotid bruit 05/09/2018  . Tachycardia 08/27/2016  . Adenocarcinoma of lung, stage 4, left ([place]) 04/11/2016  . Right shoulder pain 01/06/2015  . Osteoarthritis of both knees 01/06/2015  . Colon cancer screening 04/07/2014  . Screening for breast cancer 07/16/2012  . Anxiety and depression 07/16/2012  . GERD (gastroesophageal reflux disease) 07/16/2012  . Hypothyroidism 01/15/2012  . Pure hypercholesterolemia 12/12/2011  . Endometriosis   .  Carpal tunnel syndrome   . Degenerative joint disease   . Hypertension 07/18/2011  . Allergic rhinitis 07/18/2011    Conditions to be addressed/monitored:HTN and Lung Cancer  Care Plan : Hypertension (Adult)  Updates made by Leona Singleton, RN since 01/09/2021 12:00 AM  Problem: Hypertension (Hypertension)   Priority: Medium  Long-Range Goal: Patient will report monitoring blood pressures at least 3 times a week within the next 90 days.   Start Date: 12/14/2020  Expected End Date: 05/30/2021  This Visit's Progress: Not on track  Priority: Medium  Objective:  . Last practice recorded BP readings:  BP Readings from Last 3 Encounters:  11/25/20 126/78  05/12/20 128/72  02/05/20 120/82  Current Barriers:  [name] Knowledge Deficits related to basic understanding of hypertension pathophysiology and self care management as evidenced by patient reporting blood pressure elevations when attending chemotherapy appointments.  Continues to monitor blood pressures once a week at home, with goal of increasing to check 3 times a week.  BP has ranged 130/80's.   But when she attends her infusion appointments, her blood pressures are usually a lot more elevated.  Denies any symptoms of hypertension at home.  Denies any further falls. Case Manager Clinical Goal(s):  . patient will verbalize understanding of plan for hypertension management . patient will demonstrate improved adherence to prescribed treatment plan for hypertension as evidenced by taking all medications as prescribed, monitoring and recording blood pressure as directed, adhering to low sodium/DASH diet Interventions:  . Collaboration with Crecencio Mc, MD regarding development and update of comprehensive plan of care as evidenced by provider attestation and co-signature . Inter-disciplinary care team collaboration (see longitudinal plan of care) . Evaluation of current treatment plan related to hypertension self management and patient's  adherence to plan as established by provider. . Reviewed medications with patient and discussed importance of compliance . Discussed plans with patient for ongoing care management follow up and provided patient with direct contact information for care management team . Advised patient, providing education and rationale, to monitor blood pressure at least 3 times a week and record, calling PCP for findings outside established parameters.  . Blood pressure trends reviewed . Encouraged patient to monitor blood pressures at least 3 times a week and record pressures in log for provider review at medical appointments . Discussed heart healthy diet and importance of eating well balanced diet, encouraged to incorporated and eat more vegetables in diet Patient Goals/Self-Care Activities: . Check blood pressure 3 times per week . Write blood pressure results in a log for provider review . Notify provider for sustained elevations in blood pressure . Eat more vegetables in you diet Follow Up Plan: The care management team will reach out to the patient again over the next 45 business days.     Care Plan : Cancer Treatment Phase (Adult)  Updates made by Leona Singleton, RN since 01/09/2021 12:00 AM  Problem: Psychosocial Response to continued need for lung cancer treatment   Priority: Medium  Long-Range Goal: Patient will report ease of acknowledging feelings and accepting  of help related to cotninued need for chemotherapy realted to lung cancer in the next 90 days.   Start Date: 12/14/2020  Expected End Date: 05/30/2021  This Visit's Progress: On track  Priority: Medium  Current Barriers:   Difficulty Self Health Maintenance related to continued need for chemotherapy related to lung cancer diagnosis.  Patient reporting this is her 5th year of treatment for lung cancer, even after multiple surgeries and rounds of chemo.  Receives chemotherapy every 3 weeks; feels like she is tolerating this round a little  better.  Stating she is less drained and nausea/vomiting is better.  Does report having porta cath placed last week due to difficulties obtaining and maintaining access.  Patient in good spirits and continues to speak with therapist at [facility]. Clinical Goal(s):  [name] Collaboration with Crecencio Mc, MD regarding development and update of comprehensive plan of care as evidenced by provider attestation and co-signature . Inter-disciplinary care team collaboration (see longitudinal plan of care)  patient will work with care management team to address care coordination and chronic disease management needs related to emotional support of continued treatment of lung cancer.   Interventions:   Evaluation of current treatment plan related to  lung cancer ,  self-management and patient's adherence to plan as established by provider.  Collaboration with Crecencio Mc, MD regarding development and update of comprehensive plan of care as evidenced by provider attestation       and co-signature  Inter-disciplinary care team collaboration (see longitudinal plan of care)  Discussed plans with patient for ongoing care management follow up and provided patient with direct contact information for care management team  Counseling provided and decision-making supported  Family involvement promoted and patient encouraged to ask for help when needed and she is not able to complete task  Self-care encouraged and  self-reflection promoted, provided patient with emotional support and empathy  Verbalization of feelings encouraged and encouraged to discuss with family; encouraged patient to continue to seek assistance and support from Palliative team at [place] promoted  Quality of sleep assessed  Relaxation techniques promoted  Discussed periods of depression and encouraged continued use of [place] Worker/therapist  Reassured patient periods of being  down/frustrated is normal and ok as long as she is using resources to get through them  Discussed benefits of porta cath and some soreness is normal; encouraged to request numbing cream prior to access for chemo tomorrow Patient [place] Activities: . Eat healthy . Get a least 8 hours of sleep at night  . Ask for help when needing it (don't be mad at yourself for needing assistance) . Fall precautions and preventions, move slowly, sitting when feeling dizzy . Allow yourself time to deal with your emotions Follow Up Plan: The care management team will reach out to the patient again over the next 45 business days.      Plan:The care management team will reach out to the patient again over the next 45 business days.  Hubert Azure RN, MSN RN Care Management Coordinator [place] [phone] [email or web address]

## 2021-01-10 ENCOUNTER — Ambulatory Visit: Admit: 2021-01-10 | Discharge: 2021-01-11 | Payer: MEDICARE

## 2021-01-10 ENCOUNTER — Ambulatory Visit: Admit: 2021-01-10 | Discharge: 2021-01-11 | Payer: MEDICARE | Attending: Adult Health | Primary: Adult Health

## 2021-01-10 ENCOUNTER — Other Ambulatory Visit: Admit: 2021-01-10 | Discharge: 2021-01-11 | Payer: MEDICARE

## 2021-01-10 DIAGNOSIS — R9389 Abnormal findings on diagnostic imaging of other specified body structures: Principal | ICD-10-CM

## 2021-01-10 DIAGNOSIS — C3490 Malignant neoplasm of unspecified part of unspecified bronchus or lung: Principal | ICD-10-CM

## 2021-01-10 DIAGNOSIS — C3492 Malignant neoplasm of unspecified part of left bronchus or lung: Principal | ICD-10-CM

## 2021-01-10 MED ORDER — LIDOCAINE-PRILOCAINE 2.5 %-2.5 % TOPICAL CREAM
Freq: Once | TOPICAL | 0 refills | 0.00000 days | Status: CP
Start: 2021-01-10 — End: 2021-01-10

## 2021-01-30 ENCOUNTER — Ambulatory Visit: Admit: 2021-01-30 | Discharge: 2021-01-31 | Payer: MEDICARE

## 2021-01-31 ENCOUNTER — Ambulatory Visit: Admit: 2021-01-31 | Discharge: 2021-02-01 | Payer: MEDICARE

## 2021-01-31 ENCOUNTER — Ambulatory Visit
Admit: 2021-01-31 | Discharge: 2021-02-01 | Payer: MEDICARE | Attending: Hematology & Oncology | Primary: Hematology & Oncology

## 2021-01-31 ENCOUNTER — Other Ambulatory Visit: Admit: 2021-01-31 | Discharge: 2021-02-01 | Payer: MEDICARE

## 2021-01-31 DIAGNOSIS — R9389 Abnormal findings on diagnostic imaging of other specified body structures: Principal | ICD-10-CM

## 2021-01-31 DIAGNOSIS — C3492 Malignant neoplasm of unspecified part of left bronchus or lung: Principal | ICD-10-CM

## 2021-01-31 DIAGNOSIS — C3490 Malignant neoplasm of unspecified part of unspecified bronchus or lung: Principal | ICD-10-CM

## 2021-02-08 ENCOUNTER — Ambulatory Visit (INDEPENDENT_AMBULATORY_CARE_PROVIDER_SITE_OTHER): Payer: Medicare Other | Admitting: *Deleted

## 2021-02-08 DIAGNOSIS — C3492 Malignant neoplasm of unspecified part of left bronchus or lung: Secondary | ICD-10-CM

## 2021-02-08 DIAGNOSIS — I10 Essential (primary) hypertension: Secondary | ICD-10-CM

## 2021-02-08 NOTE — Chronic Care Management (AMB) (Signed)
Chronic Care Management   CCM RN Visit Note  02/08/2021 Name: Stacy Santana MRN: [id number] DOB: 08/20/1951  Subjective: Stacy Santana is a 70 y.o. year old female who is a primary care patient of Derrel Nip, Aris Everts, MD. The care management team was consulted for assistance with disease management and care coordination needs.    Engaged with patient by telephone for follow up visit in response to provider referral for case management and/or care coordination services.   Consent to Services:  The patient was given information about Chronic Care Management services, agreed to services, and gave verbal consent prior to initiation of services.  Please see initial visit note for detailed documentation.   Patient agreed to services and verbal consent obtained.   Assessment: Review of patient past medical history, allergies, medications, health status, including review of consultants reports, laboratory and other test data, was performed as part of comprehensive evaluation and provision of chronic care management services.   SDOH (Social Determinants of Health) assessments and interventions performed:    CCM Care Plan  Allergies  Allergen Reactions  . Amlodipine     edema  . Codeine     Nausea and vomiting    Outpatient Encounter Medications as of 02/08/2021  Medication Sig Note  . folic acid (FOLVITE) 299 MCG tablet Take by mouth.   . loratadine (CLARITIN) 10 MG tablet Take 10 mg by mouth daily.   . vitamin B-12 (CYANOCOBALAMIN) 100 MCG tablet Take 100 mcg by mouth daily.   [name] AMERICAN GINSENG PO Take 1,000 mg by mouth 2 (two) times daily.   [name] azelastine (ASTELIN) 0.1 % nasal spray PLACE 1 SPRAY INTO BOTH NOSTRILS AT BEDTIME AS NEEDED   . buPROPion (WELLBUTRIN XL) 150 MG 24 hr tablet Take 1 tablet (150 mg total) by mouth daily. I   . clonazePAM (KLONOPIN) 0.5 MG tablet Take 1 tablet (0.5 mg total) by mouth 2 (two) times daily as needed for anxiety.   . cyanocobalamin (,VITAMIN B-12,)  1000 MCG/ML injection Inject 1 mL into skin once a week for four weeks.   [name] dexamethasone (DECADRON) 4 MG tablet Take by mouth. 12/14/2020: Reports taking post Chemo as needed  . fluconazole (DIFLUCAN) 150 MG tablet Take 1 tablet (150 mg total) by mouth daily. 12/14/2020: Reports taking with steroids post chemo infusions  . levothyroxine (SYNTHROID) 75 MCG tablet TAKE 1 TABLET(75 MCG) BY MOUTH DAILY BEFORE BREAKFAST   . loperamide (IMODIUM) 2 MG capsule Take 4 mg by mouth once.    . metoprolol succinate (TOPROL-XL) 25 MG 24 hr tablet Take 3 tablets (75 mg total) by mouth daily. Take with or immediately following a meal.   . Multiple Vitamin (MULTIVITAMIN) tablet Take 1 tablet by mouth daily.   . ondansetron (ZOFRAN) 8 MG tablet Take 8 mg by mouth every 8 (eight) hours as needed.    . prochlorperazine (COMPAZINE) 5 MG tablet     No facility-administered encounter medications on file as of 02/08/2021.    Patient Active Problem List   Diagnosis Date Noted  . Acute renal failure superimposed on stage 3 chronic kidney disease ([place]) 06/01/2020  . CKD (chronic kidney disease) stage 3, GFR 30-59 ml/min (HCC) 05/14/2020  . Choledocholithiasis 05/14/2020  . Weight loss of more than 10% body weight 05/14/2020  . Fingernail abnormalities 05/12/2020  . Chemotherapy-induced nausea 02/08/2020  . Drug-induced pruritus 11/07/2019  . MRSA colonization 07/27/2019  . Nasal mucositis (ulcerative) 05/25/2019  . Educated about COVID-19 virus infection 02/24/2019  .  Left carotid bruit 05/09/2018  . Tachycardia 08/27/2016  . Adenocarcinoma of lung, stage 4, left ([place]) 04/11/2016  . Right shoulder pain 01/06/2015  . Osteoarthritis of both knees 01/06/2015  . Colon cancer screening 04/07/2014  . Screening for breast cancer 07/16/2012  . Anxiety and depression 07/16/2012  . GERD (gastroesophageal reflux disease) 07/16/2012  . Hypothyroidism 01/15/2012  . Pure hypercholesterolemia 12/12/2011  . Endometriosis    . Carpal tunnel syndrome   . Degenerative joint disease   . Hypertension 07/18/2011  . Allergic rhinitis 07/18/2011    Conditions to be addressed/monitored:HTN and Lung Cancer  Care Plan : Hypertension (Adult)  Updates made by Leona Singleton, RN since 02/08/2021 12:00 AM  Problem: Hypertension (Hypertension)   Priority: Medium  Long-Range Goal: Patient will report monitoring blood pressures at least 3 times a week within the next 90 days.   Start Date: 12/14/2020  Expected End Date: 05/30/2021  This Visit's Progress: Not on track  Recent Progress: Not on track  Priority: Medium  Objective:  . Last practice recorded BP readings:  BP Readings from Last 3 Encounters:  11/25/20 126/78  05/12/20 128/72  02/05/20 120/82  Current Barriers:  [name] Knowledge Deficits related to basic understanding of hypertension pathophysiology and self care management as evidenced by patient reporting blood pressure elevations when attending chemotherapy appointments.  Acknowledges she has not been checking blood pressures at home, due to lack of energy related to chemo.  Does state she feels better now and will begin to monitor blood pressures.  Does state blood pressures have been 130-140/80's during chemo. Case Manager Clinical Goal(s):  . patient will verbalize understanding of plan for hypertension management . patient will demonstrate improved adherence to prescribed treatment plan for hypertension as evidenced by taking all medications as prescribed, monitoring and recording blood pressure as directed, adhering to low sodium/DASH diet Interventions:  . Collaboration with Crecencio Mc, MD regarding development and update of comprehensive plan of care as evidenced by provider attestation and co-signature . Inter-disciplinary care team collaboration (see longitudinal plan of care) . Evaluation of current treatment plan related to hypertension self management and patient's adherence to plan as  established by provider. . Reviewed medications with patient and discussed importance of compliance . Discussed plans with patient for ongoing care management follow up and provided patient with direct contact information for care management team . Advised patient, providing education and rationale, to monitor blood pressure at least 3 times a week and record, calling PCP for findings outside established parameters.  . Provided patient with emotional support and empathy . Encouraged patient to monitor blood pressures at least 3 times a week and record pressures in log for provider review at medical appointments . Discussed heart healthy diet and importance of eating well balanced diet, encouraged to incorporated and eat more vegetables in diet Patient Goals/Self-Care Activities: . Check blood pressure 3 times per week . Write blood pressure results in a log for provider review . Notify provider for sustained elevations in blood pressure . Eat more vegetables in you diet Follow Up Plan: The care management team will reach out to the patient again over the next 45 business days.     Care Plan : Cancer Treatment Phase (Adult)  Updates made by Leona Singleton, RN since 02/08/2021 12:00 AM  Problem: Psychosocial Response to continued need for lung cancer treatment   Priority: Medium  Long-Range Goal: Patient will report ease of acknowledging feelings and accepting of help related to cotninued  need for chemotherapy realted to lung cancer in the next 90 days.   Start Date: 12/14/2020  Expected End Date: 05/30/2021  This Visit's Progress: On track  Recent Progress: On track  Priority: Medium  Current Barriers:   Difficulty Self Health Maintenance related to continued need for chemotherapy related to lung cancer diagnosis.  Patient reporting her chemotherapy has changed due to it causing low hemoglobin.  States she was really tired and dizzy on her chemo rotation.  Since the change she has a little  more energy and less dizziness.  Denies any falls in the last month and she is hoping to regain more strength.  States she is do to re scan in about 9 weeks to see how the current treatment is doing.  Patient stating she was able to celebrate her birthday last month by going out of town with family and friends. Clinical Goal(s):  [name] Collaboration with Crecencio Mc, MD regarding development and update of comprehensive plan of care as evidenced by provider attestation and co-signature . Inter-disciplinary care team collaboration (see longitudinal plan of care)  patient will work with care management team to address care coordination and chronic disease management needs related to emotional support of continued treatment of lung cancer.   Interventions:   Evaluation of current treatment plan related to  lung cancer ,  self-management and patient's adherence to plan as established by provider.  Collaboration with Crecencio Mc, MD regarding development and update of comprehensive plan of care as evidenced by provider attestation       and co-signature  Inter-disciplinary care team collaboration (see longitudinal plan of care)  Discussed plans with patient for ongoing care management follow up and provided patient with direct contact information for care management team  Counseling provided and decision-making supported  Family involvement promoted and patient encouraged to ask for help when needed and she is not able to complete task  Self-care encouraged and  self-reflection promoted, provided patient with emotional support and empathy  Verbalization of feelings encouraged and encouraged to discuss with family; encouraged patient to continue to seek assistance and support from Palliative team at [place] promoted  Quality of sleep assessed and Relaxation techniques promoted  Discussed periods of depression and encouraged continued use of [place] Worker/therapist  Reassured patient periods of being down/frustrated is normal and ok as long as she is using resources to get through them  Acknowledged patient's birthday from last month and allowed patient to discuss time spent with family and friends  Fall precautions and preventions reviewed and discussed  Discussed signs and symptoms of low hemoglobin and encouraged patient to contact Oncologist if symptoms do not improve Patient [place] Activities: . Eat healthy . Get a least 8 hours of sleep at night  . Ask for help when needing it (don't be mad at yourself for needing assistance) . Fall precautions and preventions, move slowly, sitting when feeling dizzy . Allow yourself time to deal with your emotions Follow Up Plan: The care management team will reach out to the patient again over the next 45 business days.      Plan:The care management team will reach out to the patient again over the next 45 business days.   Hubert Azure RN, MSN RN Care Management Coordinator [place] [phone] [email or web address]

## 2021-02-08 NOTE — Patient Instructions (Signed)
Visit Information  PATIENT GOALS: Goals Addressed            This Visit's Progress   . (RNCM) Manage Fatigue (Tiredness- Cancer Treatment)   On track    Timeframe:  Long-Range Goal Priority:  Medium Start Date:   12/14/20                          Expected End Date:   05/30/21                    Follow Up Date 03/08/21    . Eat healthy . Get a least 8 hours of sleep at night  . Ask for help when needing it (don't be mad at yourself for needing assistance) . Fall precautions and preventions, move slowly, sitting when feeling dizzy . Allow yourself time to deal with your emotions  Why is this important?   Cancer treatment and its side effects can drain your energy. It can keep you from doing things you would like to do.  There are many things that you can do to manage fatigue.    Notes:     [name] [facility]) Track and Manage My Blood Pressure-Hypertension   Not on track    Timeframe:  Long-Range Goal Priority:  Medium Start Date:   12/14/20                          Expected End Date:  05/30/21                     Follow Up Date 03/08/21    . Check blood pressure 3 times per week . Write blood pressure results in a log for provider review . Notify provider for sustained elevations in blood pressure . Eat more vegetables in you diet  Why is this important?    You won't feel high blood pressure, but it can still hurt your blood vessels.   High blood pressure can cause heart or kidney problems. It can also cause a stroke.   Making lifestyle changes like losing a little weight or eating less salt will help.   Checking your blood pressure at home and at different times of the day can help to control blood pressure.   If the doctor prescribes medicine remember to take it the way the doctor ordered.   Call the office if you cannot afford the medicine or if there are questions about it.     Notes:        Patient verbalizes understanding of instructions provided today and agrees to  view in [place].   The care management team will reach out to the patient again over the next 45 business days.   Hubert Azure RN, MSN RN Care Management Coordinator [place] [phone] [email or web address]

## 2021-02-09 ENCOUNTER — Telehealth: Admit: 2021-02-09 | Discharge: 2021-02-10 | Payer: MEDICARE

## 2021-02-14 ENCOUNTER — Other Ambulatory Visit: Payer: Self-pay | Admitting: Internal Medicine

## 2021-02-28 ENCOUNTER — Ambulatory Visit: Admit: 2021-02-28 | Discharge: 2021-03-01 | Payer: MEDICARE | Attending: Adult Health | Primary: Adult Health

## 2021-02-28 ENCOUNTER — Other Ambulatory Visit: Admit: 2021-02-28 | Discharge: 2021-03-01 | Payer: MEDICARE

## 2021-02-28 DIAGNOSIS — C3490 Malignant neoplasm of unspecified part of unspecified bronchus or lung: Principal | ICD-10-CM

## 2021-02-28 DIAGNOSIS — R9389 Abnormal findings on diagnostic imaging of other specified body structures: Principal | ICD-10-CM

## 2021-02-28 DIAGNOSIS — C3492 Malignant neoplasm of unspecified part of left bronchus or lung: Principal | ICD-10-CM

## 2021-03-02 ENCOUNTER — Ambulatory Visit: Admit: 2021-03-02 | Discharge: 2021-03-03 | Payer: MEDICARE

## 2021-03-02 ENCOUNTER — Other Ambulatory Visit: Admit: 2021-03-02 | Discharge: 2021-03-03 | Payer: MEDICARE

## 2021-03-02 DIAGNOSIS — C3490 Malignant neoplasm of unspecified part of unspecified bronchus or lung: Principal | ICD-10-CM

## 2021-03-09 ENCOUNTER — Ambulatory Visit: Admit: 2021-03-09 | Discharge: 2021-03-10 | Payer: MEDICARE

## 2021-03-09 DIAGNOSIS — J302 Other seasonal allergic rhinitis: Principal | ICD-10-CM

## 2021-03-09 DIAGNOSIS — R21 Rash and other nonspecific skin eruption: Principal | ICD-10-CM

## 2021-03-09 DIAGNOSIS — L659 Nonscarring hair loss, unspecified: Principal | ICD-10-CM

## 2021-03-09 DIAGNOSIS — L239 Allergic contact dermatitis, unspecified cause: Principal | ICD-10-CM

## 2021-03-09 MED ORDER — HYDROCORTISONE 2.5 % TOPICAL OINTMENT
Freq: Two times a day (BID) | TOPICAL | 6 refills | 0.00000 days | Status: CP
Start: 2021-03-09 — End: 2022-03-09

## 2021-03-19 ENCOUNTER — Other Ambulatory Visit: Payer: Self-pay | Admitting: Internal Medicine

## 2021-03-21 ENCOUNTER — Ambulatory Visit: Admit: 2021-03-21 | Discharge: 2021-03-22 | Payer: MEDICARE

## 2021-03-21 ENCOUNTER — Ambulatory Visit: Admit: 2021-03-21 | Discharge: 2021-03-22 | Payer: MEDICARE | Attending: Adult Health | Primary: Adult Health

## 2021-03-21 ENCOUNTER — Other Ambulatory Visit: Admit: 2021-03-21 | Discharge: 2021-03-22 | Payer: MEDICARE

## 2021-03-21 DIAGNOSIS — C3492 Malignant neoplasm of unspecified part of left bronchus or lung: Principal | ICD-10-CM

## 2021-03-21 DIAGNOSIS — Z1159 Encounter for screening for other viral diseases: Principal | ICD-10-CM

## 2021-03-21 DIAGNOSIS — R9389 Abnormal findings on diagnostic imaging of other specified body structures: Principal | ICD-10-CM

## 2021-03-21 DIAGNOSIS — C3490 Malignant neoplasm of unspecified part of unspecified bronchus or lung: Principal | ICD-10-CM

## 2021-03-28 ENCOUNTER — Telehealth: Admit: 2021-03-28 | Discharge: 2021-03-29 | Payer: MEDICARE

## 2021-04-10 ENCOUNTER — Ambulatory Visit: Admit: 2021-04-10 | Discharge: 2021-04-11 | Payer: MEDICARE

## 2021-04-11 ENCOUNTER — Other Ambulatory Visit: Admit: 2021-04-11 | Discharge: 2021-04-12 | Payer: MEDICARE

## 2021-04-11 ENCOUNTER — Ambulatory Visit: Admit: 2021-04-11 | Discharge: 2021-04-12 | Payer: MEDICARE

## 2021-04-11 ENCOUNTER — Ambulatory Visit
Admit: 2021-04-11 | Discharge: 2021-04-12 | Payer: MEDICARE | Attending: Hematology & Oncology | Primary: Hematology & Oncology

## 2021-04-11 DIAGNOSIS — R9389 Abnormal findings on diagnostic imaging of other specified body structures: Principal | ICD-10-CM

## 2021-04-11 DIAGNOSIS — C3490 Malignant neoplasm of unspecified part of unspecified bronchus or lung: Principal | ICD-10-CM

## 2021-04-11 DIAGNOSIS — Z1159 Encounter for screening for other viral diseases: Principal | ICD-10-CM

## 2021-04-11 DIAGNOSIS — C3492 Malignant neoplasm of unspecified part of left bronchus or lung: Principal | ICD-10-CM

## 2021-04-11 MED ORDER — TUCATINIB 150 MG TABLET
ORAL_TABLET | Freq: Two times a day (BID) | ORAL | 5 refills | 30 days | Status: CP
Start: 2021-04-11 — End: ?

## 2021-04-12 DIAGNOSIS — C3492 Malignant neoplasm of unspecified part of left bronchus or lung: Principal | ICD-10-CM

## 2021-04-13 MED ORDER — OLANZAPINE 5 MG TABLET
ORAL_TABLET | Freq: Every evening | ORAL | 0 refills | 16 days | Status: CP
Start: 2021-04-13 — End: 2022-04-13

## 2021-04-14 DIAGNOSIS — R634 Abnormal weight loss: Principal | ICD-10-CM

## 2021-04-14 DIAGNOSIS — Z79899 Other long term (current) drug therapy: Principal | ICD-10-CM

## 2021-04-14 DIAGNOSIS — N189 Chronic kidney disease, unspecified: Principal | ICD-10-CM

## 2021-04-14 DIAGNOSIS — F32A Anxiety and depression: Principal | ICD-10-CM

## 2021-04-14 DIAGNOSIS — N183 Stage 3 chronic kidney disease, unspecified whether stage 3a or 3b CKD (CMS-HCC): Principal | ICD-10-CM

## 2021-04-14 DIAGNOSIS — L658 Other specified nonscarring hair loss: Principal | ICD-10-CM

## 2021-04-14 DIAGNOSIS — C778 Secondary and unspecified malignant neoplasm of lymph nodes of multiple regions: Principal | ICD-10-CM

## 2021-04-14 DIAGNOSIS — T50905A Adverse effect of unspecified drugs, medicaments and biological substances, initial encounter: Principal | ICD-10-CM

## 2021-04-14 DIAGNOSIS — C3490 Malignant neoplasm of unspecified part of unspecified bronchus or lung: Principal | ICD-10-CM

## 2021-04-14 DIAGNOSIS — C3492 Malignant neoplasm of unspecified part of left bronchus or lung: Principal | ICD-10-CM

## 2021-04-14 DIAGNOSIS — F419 Anxiety disorder, unspecified: Principal | ICD-10-CM

## 2021-04-18 ENCOUNTER — Other Ambulatory Visit: Admit: 2021-04-18 | Discharge: 2021-04-19 | Payer: MEDICARE

## 2021-04-18 ENCOUNTER — Ambulatory Visit: Admit: 2021-04-18 | Discharge: 2021-04-19 | Payer: MEDICARE

## 2021-04-18 DIAGNOSIS — Z1159 Encounter for screening for other viral diseases: Principal | ICD-10-CM

## 2021-04-18 DIAGNOSIS — R9389 Abnormal findings on diagnostic imaging of other specified body structures: Principal | ICD-10-CM

## 2021-04-18 DIAGNOSIS — C3492 Malignant neoplasm of unspecified part of left bronchus or lung: Principal | ICD-10-CM

## 2021-04-19 ENCOUNTER — Ambulatory Visit (INDEPENDENT_AMBULATORY_CARE_PROVIDER_SITE_OTHER): Payer: Medicare Other | Admitting: *Deleted

## 2021-04-19 DIAGNOSIS — C3492 Malignant neoplasm of unspecified part of left bronchus or lung: Secondary | ICD-10-CM | POA: Diagnosis not present

## 2021-04-19 DIAGNOSIS — I10 Essential (primary) hypertension: Secondary | ICD-10-CM

## 2021-04-19 NOTE — Chronic Care Management (AMB) (Signed)
Chronic Care Management   CCM RN Visit Note  04/19/2021 Name: Stacy Santana MRN: [id number] DOB: 09-28-1951  Subjective: Stacy Santana is a 70 y.o. year old female who is a primary care patient of Derrel Nip, Aris Everts, MD. The care management team was consulted for assistance with disease management and care coordination needs.    Engaged with patient by telephone for follow up visit in response to provider referral for case management and/or care coordination services.   Consent to Services:  The patient was given information about Chronic Care Management services, agreed to services, and gave verbal consent prior to initiation of services.  Please see initial visit note for detailed documentation.   Patient agreed to services and verbal consent obtained.   Assessment: Review of patient past medical history, allergies, medications, health status, including review of consultants reports, laboratory and other test data, was performed as part of comprehensive evaluation and provision of chronic care management services.   SDOH (Social Determinants of Health) assessments and interventions performed:    CCM Care Plan  Allergies  Allergen Reactions   Amlodipine     edema   Codeine     Nausea and vomiting    Outpatient Encounter Medications as of 04/19/2021  Medication Sig Note   OLANZapine (ZYPREXA) 5 MG tablet Take by mouth.    AMERICAN GINSENG PO Take 1,000 mg by mouth 2 (two) times daily.    azelastine (ASTELIN) 0.1 % nasal spray PLACE 1 SPRAY INTO BOTH NOSTRILS AT BEDTIME AS NEEDED    buPROPion (WELLBUTRIN XL) 150 MG 24 hr tablet Take 1 tablet (150 mg total) by mouth daily. I    clonazePAM (KLONOPIN) 0.5 MG tablet Take 1 tablet (0.5 mg total) by mouth 2 (two) times daily as needed for anxiety.    cyanocobalamin (,VITAMIN B-12,) 1000 MCG/ML injection Inject 1 mL into skin once a week for four weeks.    dexamethasone (DECADRON) 4 MG tablet Take by mouth. 12/14/2020: Reports taking post  Chemo as needed   fluconazole (DIFLUCAN) 150 MG tablet Take 1 tablet (150 mg total) by mouth daily. 12/14/2020: Reports taking with steroids post chemo infusions   folic acid (FOLVITE) 938 MCG tablet Take by mouth.    levothyroxine (SYNTHROID) 75 MCG tablet TAKE 1 TABLET(75 MCG) BY MOUTH DAILY BEFORE BREAKFAST    loperamide (IMODIUM) 2 MG capsule Take 4 mg by mouth once.     loratadine (CLARITIN) 10 MG tablet Take 10 mg by mouth daily.    metoprolol succinate (TOPROL-XL) 25 MG 24 hr tablet TAKE 3 TABLETS(75 MG) BY MOUTH DAILY WITH OR IMMEDIATELY FOLLOWING A MEAL    Multiple Vitamin (MULTIVITAMIN) tablet Take 1 tablet by mouth daily.    ondansetron (ZOFRAN) 8 MG tablet Take 8 mg by mouth every 8 (eight) hours as needed.     prochlorperazine (COMPAZINE) 5 MG tablet     vitamin B-12 (CYANOCOBALAMIN) 100 MCG tablet Take 100 mcg by mouth daily.    No facility-administered encounter medications on file as of 04/19/2021.    Patient Active Problem List   Diagnosis Date Noted   Acute renal failure superimposed on stage 3 chronic kidney disease ([place]) 06/01/2020   CKD (chronic kidney disease) stage 3, GFR 30-59 ml/min ([place]) 05/14/2020   Choledocholithiasis 05/14/2020   Weight loss of more than 10% body weight 05/14/2020   Fingernail abnormalities 05/12/2020   Chemotherapy-induced nausea 02/08/2020   Drug-induced pruritus 11/07/2019   MRSA colonization 07/27/2019   Nasal mucositis (ulcerative) 05/25/2019  Educated about COVID-19 virus infection 02/24/2019   Left carotid bruit 05/09/2018   Tachycardia 08/27/2016   Adenocarcinoma of lung, stage 4, left ([place]) 04/11/2016   Right shoulder pain 01/06/2015   Osteoarthritis of both knees 01/06/2015   Colon cancer screening 04/07/2014   Screening for breast cancer 07/16/2012   Anxiety and depression 07/16/2012   GERD (gastroesophageal reflux disease) 07/16/2012   Hypothyroidism 01/15/2012   Pure hypercholesterolemia 12/12/2011   Endometriosis     Carpal tunnel syndrome    Degenerative joint disease    Hypertension 07/18/2011   Allergic rhinitis 07/18/2011    Conditions to be addressed/monitored:HTN and Lung CA  Care Plan : Hypertension (Adult)  Updates made by Leona Singleton, RN since 04/19/2021 12:00 AM     Problem: Hypertension (Hypertension)   Priority: Medium     Long-Range Goal: Patient will report monitoring blood pressures at least 3 times a week within the next 90 days.   Start Date: 12/14/2020  Expected End Date: 09/29/2021  This Visit's Progress: On track  Recent Progress: Not on track  Priority: Medium  Note:   Objective:  Last practice recorded BP readings:  BP Readings from Last 3 Encounters:  11/25/20 126/78  05/12/20 128/72  02/05/20 120/82  Current Barriers:  Knowledge Deficits related to basic understanding of hypertension pathophysiology and self care management as evidenced by patient reporting blood pressure elevations when attending chemotherapy appointments.  States she is happy about blood pressure readings lately.  Ranges have been 110-120/80's.  Denies any hyper or hypotension episodes. Case Manager Clinical Goal(s):  patient will verbalize understanding of plan for hypertension management patient will demonstrate improved adherence to prescribed treatment plan for hypertension as evidenced by taking all medications as prescribed, monitoring and recording blood pressure as directed, adhering to low sodium/DASH diet Interventions:  Collaboration with Crecencio Mc, MD regarding development and update of comprehensive plan of care as evidenced by provider attestation and co-signature Inter-disciplinary care team collaboration (see longitudinal plan of care) Evaluation of current treatment plan related to hypertension self management and patient's adherence to plan as established by provider. Reviewed medications with patient and discussed importance of compliance Discussed plans with patient  for ongoing care management follow up and provided patient with direct contact information for care management team Advised patient, providing education and rationale, to monitor blood pressure at least 3 times a week and record, calling PCP for findings outside established parameters.  Provided patient with emotional support and empathy Encouraged patient to monitor blood pressures at least 3 times a week and record pressures in log for provider review at medical appointments Discussed heart healthy diet and importance of eating well balanced diet, encouraged to incorporated and eat more vegetables in diet Reviewed recent blood pressure readings and congratulated patient on ranges Patient Goals/Self-Care Activities: Check blood pressure 3 times per week Write blood pressure results in a log for provider review Notify provider for sustained elevations in blood pressure Eat more vegetables in you diet Follow Up Plan: The care management team will reach out to the patient again over the next 45 business days.        Care Plan : Cancer Treatment Phase (Adult)  Updates made by Leona Singleton, RN since 04/19/2021 12:00 AM     Problem: Psychosocial Response to continued need for lung cancer treatment   Priority: Medium     Long-Range Goal: Patient will report ease of acknowledging feelings and accepting of help related to cotninued need for chemotherapy  realted to lung cancer in the next 90 days.   Start Date: 12/14/2020  Expected End Date: 09/29/2021  This Visit's Progress: On track  Recent Progress: On track  Priority: Medium  Note:   Current Barriers:  Difficulty Self Health Maintenance related to continued need for chemotherapy related to lung cancer diagnosis.  Patient reporting she has started new chemotherapy regime.  States she is tolerating so far without any difficulties.  Occasional nausea and mania managed with medication. Clinical Goal(s):  Collaboration with Crecencio Mc, MD regarding development and update of comprehensive plan of care as evidenced by provider attestation and co-signature Inter-disciplinary care team collaboration (see longitudinal plan of care) patient will work with care management team to address care coordination and chronic disease management needs related to emotional support of continued treatment of lung cancer.   Interventions:  Evaluation of current treatment plan related to  lung cancer ,  self-management and patient's adherence to plan as established by provider. Collaboration with Crecencio Mc, MD regarding development and update of comprehensive plan of care as evidenced by provider attestation       and co-signature Inter-disciplinary care team collaboration (see longitudinal plan of care) Discussed plans with patient for ongoing care management follow up and provided patient with direct contact information for care management team Counseling provided and decision-making supported Family involvement promoted and patient encouraged to ask for help when needed and she is not able to complete task Self-care encouraged and  self-reflection promoted, provided patient with emotional support and empathy Verbalization of feelings encouraged and encouraged to discuss with family; encouraged patient to continue to seek assistance and support from Palliative team at [place] promoted Quality of sleep assessed and Relaxation techniques promoted Discussed periods of depression and encouraged continued use of [place] Worker/therapist Reassured patient periods of being down/frustrated is normal and ok as long as she is using resources to get through them Fall precautions and preventions reviewed and discussed Discussed patient's upcoming cruise trip and encouraged patient to wear mask and continue with social distancing Congratulated patient on continued upbeat positive attitude Patient  [place] Activities: Eat healthy Get a least 8 hours of sleep at night  Ask for help when needing it (don't be mad at yourself for needing assistance) Fall precautions and preventions, move slowly, sitting when feeling dizzy Allow yourself time to deal with your emotions Follow Up Plan: The care management team will reach out to the patient again over the next 45 business days.        Plan:The care management team will reach out to the patient again over the next 45 business days.  Hubert Azure RN, MSN RN Care Management Coordinator [place] [phone] [email or web address]

## 2021-04-19 NOTE — Patient Instructions (Signed)
Visit Information  PATIENT GOALS:  Goals Addressed             This Visit's Progress    (RNCM) Manage Fatigue (Tiredness- Cancer Treatment)   On track    Timeframe:  Long-Range Goal Priority:  Medium Start Date:   12/14/20                          Expected End Date:   05/30/21                    Follow Up Date 05/26/21    Eat healthy Get a least 8 hours of sleep at night  Ask for help when needing it (don't be mad at yourself for needing assistance) Fall precautions and preventions, move slowly, sitting when feeling dizzy Allow yourself time to deal with your emotions  Why is this important?   Cancer treatment and its side effects can drain your energy. It can keep you from doing things you would like to do.  There are many things that you can do to manage fatigue.    Notes:      (RNCM) Track and Manage My Blood Pressure-Hypertension   On track    Timeframe:  Long-Range Goal Priority:  Medium Start Date:   12/14/20                          Expected End Date:  05/30/21                     Follow Up Date 05/26/21    Check blood pressure 3 times per week Write blood pressure results in a log for provider review Notify provider for sustained elevations in blood pressure Eat more vegetables in you diet  Why is this important?   You won't feel high blood pressure, but it can still hurt your blood vessels.  High blood pressure can cause heart or kidney problems. It can also cause a stroke.  Making lifestyle changes like losing a little weight or eating less salt will help.  Checking your blood pressure at home and at different times of the day can help to control blood pressure.  If the doctor prescribes medicine remember to take it the way the doctor ordered.  Call the office if you cannot afford the medicine or if there are questions about it.     Notes:         Patient verbalizes understanding of instructions provided today and agrees to view in [place].   The care  management team will reach out to the patient again over the next 45 business days.   Hubert Azure RN, MSN RN Care Management Coordinator [place] [phone] [email or web address]

## 2021-04-20 ENCOUNTER — Telehealth: Payer: Self-pay

## 2021-04-20 MED ORDER — AZELASTINE HCL 0.1 % NA SOLN: NASAL | 6 refills | Status: DC

## 2021-04-20 NOTE — Telephone Encounter (Signed)
Medication has been refilled.

## 2021-04-25 ENCOUNTER — Ambulatory Visit: Admit: 2021-04-25 | Discharge: 2021-04-26 | Payer: MEDICARE

## 2021-04-25 ENCOUNTER — Other Ambulatory Visit: Admit: 2021-04-25 | Discharge: 2021-04-26 | Payer: MEDICARE

## 2021-04-25 DIAGNOSIS — R9389 Abnormal findings on diagnostic imaging of other specified body structures: Principal | ICD-10-CM

## 2021-04-25 DIAGNOSIS — Z1159 Encounter for screening for other viral diseases: Principal | ICD-10-CM

## 2021-04-25 DIAGNOSIS — C3492 Malignant neoplasm of unspecified part of left bronchus or lung: Principal | ICD-10-CM

## 2021-04-25 NOTE — Unmapped (Signed)
PIV placed.  Labs drawn & sent for analysis. To next appt.  Care provided by Marko Stai RN.

## 2021-04-26 DIAGNOSIS — C3492 Malignant neoplasm of unspecified part of left bronchus or lung: Principal | ICD-10-CM

## 2021-04-26 MED ORDER — PEGFILGRASTIM-BMEZ 6 MG/0.6 ML SUBCUTANEOUS SYRINGE
5 refills | 0 days | Status: CP
Start: 2021-04-26 — End: ?

## 2021-04-26 MED ORDER — LAPATINIB 250 MG TABLET
ORAL_TABLET | Freq: Every day | ORAL | 11 refills | 30 days | Status: CP
Start: 2021-04-26 — End: ?

## 2021-04-26 NOTE — Unmapped (Signed)
Clinical Pharmacist Practitioner: Telephone Note    Reason for Call - Medication Access    I received a call from Ms. Mcgaha regarding upcoming chemotherapy cycle. Patient stated she was going to be out of town on 8/2, but will return on 8/9. Will request reschedule to start C2 on 8/9 and confirm with Dr. Janann August no concerns with change.     Additionally, patient stated that she heard back from a [employer] representative regarding her tucatinib medication and asked if the patient has explored grant/foundation support. Will reach out to MAP team to clarify, as standard process is to routinely pursue grant/foundation investigation prior to submitting for [facility] assistance application.     Lastly, discussed with patient use of gCSF for neutropenia support. Patient amenable to using home self-injection, so new script sent to [facility] to start benefits investigation. Patient inquired about potential injection at [facility] infusion, so will reach out to our Nurse Navigator on if possible to get patient in there for upcoming cycle in case home self-injection is not approved prior to needing dose.     I spent 15 minutes in direct patient care.    Nicholos Johns, PharmD, CPP  Clinical Pharmacist Practitioner, Thoracic Oncology & Sarcoma  Pager: [phone]

## 2021-04-26 NOTE — Unmapped (Signed)
[facility] Specialty Medication Onboarding    Specialty Medication: Tukysa 150mg  tablets  Prior Authorization: Approved   Financial Assistance: No - patient doesn't qualify for additional assistance   Final Copay/Day Supply: $3531.06 / 30 days    Insurance Restrictions: None     Notes to Pharmacist:     The triage team has completed the benefits investigation and has determined that the patient is able to fill this medication at [facility]. Please contact the patient to complete the onboarding or follow up with the prescribing physician as needed.

## 2021-05-03 DIAGNOSIS — R9389 Abnormal findings on diagnostic imaging of other specified body structures: Principal | ICD-10-CM

## 2021-05-03 DIAGNOSIS — C3492 Malignant neoplasm of unspecified part of left bronchus or lung: Principal | ICD-10-CM

## 2021-05-03 DIAGNOSIS — Z1159 Encounter for screening for other viral diseases: Principal | ICD-10-CM

## 2021-05-05 DIAGNOSIS — Z79899 Other long term (current) drug therapy: Principal | ICD-10-CM

## 2021-05-05 DIAGNOSIS — R110 Nausea: Principal | ICD-10-CM

## 2021-05-05 DIAGNOSIS — N183 Stage 3 chronic kidney disease, unspecified whether stage 3a or 3b CKD (CMS-HCC): Principal | ICD-10-CM

## 2021-05-05 DIAGNOSIS — T451X5A Adverse effect of antineoplastic and immunosuppressive drugs, initial encounter: Principal | ICD-10-CM

## 2021-05-05 DIAGNOSIS — C3492 Malignant neoplasm of unspecified part of left bronchus or lung: Principal | ICD-10-CM

## 2021-05-05 NOTE — Unmapped (Signed)
05/05/21 confirmed via vm 8/17 @ 11:39am.

## 2021-05-09 ENCOUNTER — Ambulatory Visit: Admit: 2021-05-09 | Discharge: 2021-05-10 | Payer: MEDICARE

## 2021-05-09 ENCOUNTER — Ambulatory Visit: Admit: 2021-05-09 | Discharge: 2021-05-10 | Payer: MEDICARE | Attending: Adult Health | Primary: Adult Health

## 2021-05-09 ENCOUNTER — Other Ambulatory Visit: Admit: 2021-05-09 | Discharge: 2021-05-10 | Payer: MEDICARE

## 2021-05-09 DIAGNOSIS — C3490 Malignant neoplasm of unspecified part of unspecified bronchus or lung: Principal | ICD-10-CM

## 2021-05-09 DIAGNOSIS — Z1159 Encounter for screening for other viral diseases: Principal | ICD-10-CM

## 2021-05-09 DIAGNOSIS — C3492 Malignant neoplasm of unspecified part of left bronchus or lung: Principal | ICD-10-CM

## 2021-05-09 DIAGNOSIS — R9389 Abnormal findings on diagnostic imaging of other specified body structures: Principal | ICD-10-CM

## 2021-05-09 LAB — CBC W/ AUTO DIFF
BASOPHILS ABSOLUTE COUNT: 0.1 10*9/L (ref 0.0–0.1)
BASOPHILS RELATIVE PERCENT: 1.3 %
EOSINOPHILS ABSOLUTE COUNT: 0 10*9/L (ref 0.0–0.5)
EOSINOPHILS RELATIVE PERCENT: 0.9 %
HEMATOCRIT: 29.1 % — ABNORMAL LOW (ref 34.0–44.0)
HEMOGLOBIN: 10.4 g/dL — ABNORMAL LOW (ref 11.3–14.9)
LYMPHOCYTES ABSOLUTE COUNT: 0.4 10*9/L — ABNORMAL LOW (ref 1.1–3.6)
LYMPHOCYTES RELATIVE PERCENT: 9.2 %
MEAN CORPUSCULAR HEMOGLOBIN CONC: 35.8 g/dL (ref 32.0–36.0)
MEAN CORPUSCULAR HEMOGLOBIN: 32 pg (ref 25.9–32.4)
MEAN CORPUSCULAR VOLUME: 89.5 fL (ref 77.6–95.7)
MEAN PLATELET VOLUME: 6.9 fL (ref 6.8–10.7)
MONOCYTES ABSOLUTE COUNT: 0.6 10*9/L (ref 0.3–0.8)
MONOCYTES RELATIVE PERCENT: 13.6 %
NEUTROPHILS ABSOLUTE COUNT: 3.4 10*9/L (ref 1.8–7.8)
NEUTROPHILS RELATIVE PERCENT: 75 %
PLATELET COUNT: 281 10*9/L (ref 150–450)
RED BLOOD CELL COUNT: 3.25 10*12/L — ABNORMAL LOW (ref 3.95–5.13)
RED CELL DISTRIBUTION WIDTH: 13.6 % (ref 12.2–15.2)
WBC ADJUSTED: 4.5 10*9/L (ref 3.6–11.2)

## 2021-05-09 LAB — COMPREHENSIVE METABOLIC PANEL
ALBUMIN: 3.6 g/dL (ref 3.4–5.0)
ALKALINE PHOSPHATASE: 86 U/L (ref 46–116)
ALT (SGPT): 11 U/L (ref 10–49)
ANION GAP: 4 mmol/L — ABNORMAL LOW (ref 5–14)
AST (SGOT): 20 U/L (ref <=34)
BILIRUBIN TOTAL: 0.3 mg/dL (ref 0.3–1.2)
BLOOD UREA NITROGEN: 18 mg/dL (ref 9–23)
BUN / CREAT RATIO: 16
CALCIUM: 9.5 mg/dL (ref 8.7–10.4)
CHLORIDE: 106 mmol/L (ref 98–107)
CO2: 26 mmol/L (ref 20.0–31.0)
CREATININE: 1.11 mg/dL — ABNORMAL HIGH
EGFR CKD-EPI (2021) FEMALE: 53 mL/min/{1.73_m2} — ABNORMAL LOW (ref >=60)
GLUCOSE RANDOM: 78 mg/dL (ref 70–179)
POTASSIUM: 3.8 mmol/L (ref 3.4–4.8)
PROTEIN TOTAL: 6.7 g/dL (ref 5.7–8.2)
SODIUM: 136 mmol/L (ref 135–145)

## 2021-05-09 MED ORDER — PEGFILGRASTIM-BMEZ 6 MG/0.6 ML SUBCUTANEOUS SYRINGE
5 refills | 0 days | Status: CP
Start: 2021-05-09 — End: ?

## 2021-05-09 MED ADMIN — sodium chloride 0.9% (NS) bolus 500 mL: 500 mL | INTRAVENOUS | @ 16:00:00 | Stop: 2021-05-09

## 2021-05-09 MED ADMIN — fosaprepitant (EMEND) 150 mg in sodium chloride (NS) 0.9 % 100 mL IVPB: 150 mg | INTRAVENOUS | @ 16:00:00 | Stop: 2021-05-09

## 2021-05-09 MED ADMIN — heparin, porcine (PF) 100 unit/mL injection 500 Units: 500 [IU] | INTRAVENOUS | @ 19:00:00 | Stop: 2021-05-10

## 2021-05-09 MED ADMIN — PACLitaxeL protein-bound (ABRAXANE) IVPB 166 mg: 100 mg/m2 | INTRAVENOUS | @ 18:00:00 | Stop: 2021-05-09

## 2021-05-09 MED ADMIN — sodium chloride (NS) 0.9 % infusion: 100 mL/h | INTRAVENOUS | @ 18:00:00

## 2021-05-09 MED ADMIN — ondansetron (ZOFRAN) tablet 24 mg: 24 mg | ORAL | @ 16:00:00 | Stop: 2021-05-09

## 2021-05-09 MED ADMIN — dexAMETHasone (DECADRON) tablet 12 mg: 12 mg | ORAL | @ 16:00:00 | Stop: 2021-05-09

## 2021-05-09 MED ADMIN — CARBOplatin (PARAPLATIN) 356.5 mg in sodium chloride (NS) 0.9 % 250 mL IVPB: 356.5 mg | INTRAVENOUS | @ 19:00:00 | Stop: 2021-05-09

## 2021-05-09 NOTE — Unmapped (Unsigned)
PT in lab for port access and lab draws, 20 3/4 gauge needle used, blood return brisk, saline locked, accessed by [employer] sent for analysis.

## 2021-05-09 NOTE — Unmapped (Signed)
Vania Rea NP contacted for crcl 47.3 which is below parameters.  Ok to treat.

## 2021-05-09 NOTE — Unmapped (Signed)
Referring Physician: Molli Barrows, Md  [address]  Medicine  [id number] Physician Office Building  [place],  [place] [address].   PCP: Sherlene Shams, MD  Medical Oncologist: Eloise Harman, MD  Radiation Oncologist: Rubye Beach, MD  Thoracic Surgeon: Merrie Roof, MD    Reason for visit: Krista Pratt was initially seen at the request of Dr. Jacqulyn Bath for a thoracic medical oncology opinion regarding new diagnosis of NSCLC.    DIAGNOSIS: NSCLC, adenocarcinoma     STAGE: initially pT2a N0, now stage IV    HPI:  Krista Pratt is a 70 y.o. female former smoker with a 12 pack-year smoking history and past medical history of HTN, hypothyroidism, OA, depression and glaucoma who was recently diagnosed with a pT2a N0 lung adenocarcinoma.     - 08/2015 - persistent cough lead to a CT, which found a mass  - bronch-mediated Bx of LUL mass showed non-specific inflammation  - Followed with serial CT's  - 03/2016 - CT showed interval increase in size of LUL mass (extending from just SUP to L hilum to apex)  - Pt was asymptomatic at the time  - 03/2016 - PFT's nl  - PET showed FDG-avid lesion in LUL, no hot LN's or distant mets  - 04/04/16 (Dr. Jacqulyn Bath) - lobectomy and mediastinal LND   - Path showed invasive mucinous adenocarcinoma (~5 cm) with a SM+ at the stapled vascular margin. 0/12 LN's.  - completed adjuvant cis/pem and XRT in 1/18  - 7/18 local regional recurrence s/p resection 06/06/17, PD-L1 0%. Path showed invasive mucinous adenocarcinoma 1.6cm with negative margins. STRATA with ERBB2 mutation.   - recommended active surveillance  - 12/23/17 PET concerning for recurrence  - 01/06/18 FNA showed atypical epithelial cells  - 02/06/18 L pneumonectomy 5.6cm invasive mucinous adenocarcinoma ([id number])  -10/27/18 started neratinib for PD in multiple right lung nodules, PD 07/2020  - 07/12/20 started Enhertu, completed 2C  - 08/23/20, PD on scans. Plan to initiate next line therapy with carbo/pem/pembro  - 08/30/20 C1 carbo/pem/pembro  - 09/20/20 C2 carbo/pem/pembro  - 10/11/20 C3 carbo/pem/pembro   - 11/01/20 - 1 wk delay for neutropenia   - 11/08/20 - c4 carbo/pem/pembro   - 04/10/21 - PD on scans.  - 04/11/21 - C1 carbo/abraxane     Interim History:   She is well, returned last night from her river cruise in [place] and enjoyed herself. Feeling very fatigued since her last infusion, needing to lie down around 4pm each day. She lost her hair. Some nausea for the first few days but tolerated OK and appetite remained intact. No bowel concerns, no pain, breathing stable.     ROS:  10 systems were reviewed and found to be within normal limits except as described in the HPI.    PMH:  Past Medical History:   Diagnosis Date   ??? Allergic rhinitis    ??? Cancer (CMS-HCC)     Lung   ??? CTS (carpal tunnel syndrome)    ??? Depression    ??? Glaucoma     left eye   ??? Hypertension    ??? Hypothyroidism    ??? Osteoarthritis      PSH:  Past Surgical History:   Procedure Laterality Date   ??? APPENDECTOMY N/A 1963    ruptured w. complications   ??? CARPAL TUNNEL RELEASE Right 03/01/14   ??? EXPLORATORY LAPAROTOMY N/A 1989   ??? IR INSERT PORT AGE GREATER THAN 5 YRS  01/03/2021  IR INSERT PORT AGE GREATER THAN 5 YRS 01/03/2021 Gwenlyn Fudge, MD IMG VIR [facility]   ??? NASAL SINUS SURGERY N/A 2013   ??? PR BRNCHSC EBUS GUIDED SAMPL 1/2 NODE STATION/STRUX N/A 01/06/2018    Procedure: Bronch, Rigid Or Flexible, Inc Fluoro Guidance, When Performed; With Ebus Guided Transtracheal And/Or Transbronchial Sampling, One Or Two Mediastinal And/Or Hilar Lymph Node Stations Or Structures;  Surgeon: Mercy Moore, MD;  Location: MAIN OR [facility];  Service: Pulmonary   ??? PR BRONCHOSCOPY W/THER ASPIR TRACHBRNCL TREE 1ST N/A 06/06/2017    Procedure: Bronchoscpy, Rigid Or Flexible, W/Fluoro; With Therapeutic Aspiration Of Tracheobronchial Tree, Initial;  Surgeon: Cherie Dark, MD;  [facility];  Service: Thoracic   ??? PR BRONCHOSCOPY W/THER ASPIR TRACHBRNCL TREE 1ST N/A 02/06/2018    Procedure: Bronchoscpy, Rigid Or Flexible, W/Fluoro; With Therapeutic Aspiration Of Tracheobronchial Tree, Initial;  Surgeon: Cherie Dark, MD;  Location: MAIN OR [facility];  Service: Thoracic   ??? PR BRONCHOSCOPY,COMPUTER ASSIST/IMAGE-GUIDED NAVIGATION N/A 08/31/2015    Procedure: Bronchoscopy, Flexible, Include Fluoro When Performed; W/Computer-Assist, Image-Guided Navigation;  Surgeon: Jerelyn Charles, MD;  Location: MAIN OR [facility];  Service: Pulmonary   ??? PR BRONCHOSCOPY,DIAGNOSTIC W LAVAGE Left 08/31/2015    Procedure: Bronchoscopy, Flexible, Include Fluoroscopic Guidance When Performed; W/Bronchial Alveolar Lavage;  Surgeon: Jerelyn Charles, MD;  Location: MAIN OR [facility];  Service: Pulmonary   ??? PR BRONCHOSCOPY,TRANSBRONCH BIOPSY Left 08/31/2015    Procedure: Bronchoscopy, Flexible, Include Fluoro Guidance When Performed; W/Transbronchial Lung Bx, Single Lobe;  Surgeon: Jerelyn Charles, MD;  Location: MAIN OR [facility];  Service: Pulmonary   ??? PR DECORTICATION,PULMONARY,TOTAL Left 02/06/2018    Procedure: Decortic Pulm (Separt Proc); Tot;  Surgeon: Cherie Dark, MD;  Location: MAIN OR [facility];  Service: Thoracic   ??? PR EXCIS INTERDIGITAL NEUROMA,EA Left 02/01/2015    Procedure: EXC INTERDIGITAL NEUROMA SNGL EA;  Surgeon: Christena Deem, MD;  Location: MAIN OR [facility];  Service: Orthopedics   ??? PR INJECTION AA&/STRD INTERCOSTAL NRV EA ADDL LVL Left 06/06/2017    Procedure: Intercostal Nerve Block-Multi Levels;  Surgeon: Cherie Dark, MD;  Location: MAIN OR [facility];  Service: Thoracic   ??? PR LARYNGOPLASTY MEDIALIZATION UNLIATERAL Bilateral 04/30/2018    Procedure: LARYNGOPLASTY, MEDIALIZATION, UNILATERAL;  Surgeon: Hardie Pulley, MD;  Location: MAIN OR [facility];  Service: ENT   ??? PR MUSCLE-SKIN FLAP,TRUNK Left 02/06/2018    Procedure: Muscle, Myocutaneous, Or Fasciocutaneous Flap; Trunk;  Surgeon: Cherie Dark, MD;  Location: MAIN OR [facility];  Service: Thoracic   ??? PR REMOVAL OF LUNG Left 02/06/2018    Procedure: REMOVAL OF LUNG, PNEUMONECTOMY;  Surgeon: Cherie Dark, MD;  Location: MAIN OR [facility];  Service: Thoracic   ??? PR THORACOSCOPY W/DX WEDGE RESEXN ANATO LUNG RESEXN Left 04/04/2016    Procedure: ROBOTIC XI THORACOSCOPY, SURGICAL; WITH DIAGNOSTIC WEDGE RESECTION FOLLOWED BY ANATOMIC LUNG RESECTION;  Surgeon: Cherie Dark, MD;  Location: MAIN OR [facility];  Service: Cardiothoracic   ??? PR THORACOTOMY W/THERAPEUTIC WEDGE RESEXN INITIAL Left 06/06/2017    Procedure: Thoracotomy; With Therapeutic Wedge Resection (Eg, Mass, Nodule), Initial;  Surgeon: Cherie Dark, MD;  Location: MAIN OR [facility];  Service: Thoracic   ??? PR THORACOTOMY,LYSE ADHESIONS Left 06/06/2017    Procedure: THORACOTOMY; WITH OPEN INTRAPLEURAL PNEUMONOLYSIS;  Surgeon: Cherie Dark, MD;  Location: MAIN OR [facility];  Service: Thoracic   ??? PR WRIST ARTHROSCOP,RELEASE XVERS LIG Right 02/26/2014    Procedure: ENDOSCOPY WRIST SURG; W/RELEAS TRANSVER LIGAMT;  Surgeon: Lorelee Cover, MD;  Location: ASC OR [facility];  Service: Orthopedics   ??? TONSILLECTOMY Bilateral 1950's   ??? TUBAL LIGATION Bilateral 1991       MEDICATIONS:  has a current medication list which includes the following prescription(s): acetaminophen, azelastine, bupropion, clonazepam, cyanocobalamin, ginseng, hydrocortisone, lactobacillus 3/fos/pantethine, lapatinib, levothyroxine, loratadine, metoprolol succinate, multivitamin, olanzapine, ondansetron, pegfilgrastim-bmez, and prochlorperazine.    ALLERGIES:   Allergies   Allergen Reactions   ??? Amlodipine      edema   ??? Codeine Nausea And Vomiting     Nausea and vomiting       SOCIAL HISTORY:  Social History     Social History Narrative    Quit smoking in 1980's. Previously smoked 1 ppd x 12 years.       reports that she quit smoking about 39 years ago. Her smoking use included cigarettes. She has a 12.00 pack-year smoking history. She has never used smokeless tobacco.    FAMILY HISTORY:  Cancer-related family history includes Prostate cancer in her father. There is no history of Cancer or Melanoma.    PHYSICAL EXAMINATION:    VS: .  Vitals:    05/09/21 0829   BP: 138/72   Pulse: 80   Resp: 16   Temp: 36.6 ??C (97.8 ??F)   SpO2: 100%       Constitutional: well developed female in NAD; alopecia   Eyes: PERRL, sclera anicteric, conjunctiva clear  ENT: oropharynx clear, moist mucous membranes, normal dentition  Cardiovascular: regular rate and rhythm, S1 S2 appreciated, no murmurs, rubs, or gallops  Respiratory:  CTAB without rales, ronchi, or wheezes  Gastrointestinal: soft, non-tender, non-distended, normal bowel sounds, no organomegaly  Musculoskeletal: Upper and lower extremities are atraumatic in appearance without tenderness or deformity. No swelling or erythema.  Skin: clean, dry and intact, no rashes or lesions  Neurologic: alert and oriented, strength and sensation grossly intact bilaterally  Psychiatric: affect varied and appropriate  Heme/Lymph/Immun: no LAD       PATHOLOGY:  Addendum   - PD-L1 (16X0) IHC has been requested, results to be reported in an addendum.  ??   Addendum electronically signed by Lorayne Bender, MD on 05/14/2016 at 1004   Final Diagnosis   A:  Lung, left upper lobe, wedge resection  - Invasive mucinous adenocarcinoma, see synoptic report for additional information.  ??  B:  Lung, left upper lobe, completion lobectomy   - Invasive mucinous adenocarcinoma, see synoptic report for additional information.  - No tumor seen in 7 lymph nodes (0/7).  ??  C:  Lymph node, level 7, resection  - No tumor seen in multiple lymph node fragments (0/1).  ??  D:  Lymph node, level 5L, resection  - No tumor seen in multiple lymph node fragments (0/1).  ??  E:  Lymph node, level 6L, resection  - No tumor seen in multiple lymph node fragments (0/1).  ??  F:  Lymph node, level 10L, resection  - No tumor seen in multiple lymph node fragments (0/1).  ??  G:  Lymph node, level 11, resection  - No tumor seen in multiple lymph node fragments (0/1).  ??   Electronically signed by Lorayne Bender, MD on 04/10/2016 at 1706   Synoptic Report   LUNG: Resection   Lung - All Specimens   SPECIMEN   Specimen  Lobe(s) of lung   Lobes of Lung  Upper lobe   Procedure  Other (specify): Wedge resection and completion  lobectomy   Specimen Laterality  Left   TUMOR   Primary Tumor Site  Upper lobe   Histologic Type  Invasive mucinous adenocarcinoma   Histologic Grade  Not applicable   Tumor Size  Cannot be determined: Transected tumor in specimens A and B (See Comment)   Tumor Focality  Unifocal   Visceral Pleura Invasion  Not identified   Tumor Extension  Not identified   Lymph-Vascular Invasion  Not identified   MARGINS   Bronchial Margin  Uninvolved by invasive carcinoma and carcinoma in situ   Vascular Margin  Involved by invasive carcinoma   Parenchymal Margin  Not applicable: (See Comment)   LYMPH NODES   Regional Lymph Nodes     Number of Lymph Nodes Examined  At least: 12   Lymph Node Involvement  No nodes involved   STAGE (pTNM)   Primary Tumor (pT)  pT2a: Tumor greater than 3 cm, but 5 cm or less in greatest dimension surrounded by lung or visceral pleura without bronchoscopic evidence of invasion more proximal than the lobar bronchus (i.e., not in the main bronchus); or Tumor 5 cm or less in greatest dimension with any of the following features of extent: involves main bronchus, 2 cm or more distal to the carina; invades the visceral pleura; associated with atelectasis or obstructive pneumonitis that extends to the hilar region but does nt involve the entire lung   Regional Lymph Nodes (pN)  pN0: No regional lymph node metastasis   .      Comment        The tumor is present within specimens A and B, and appears to have been transected in the initial wedge resection, and contiguous with tumor identified in the completion lobectomy.  Because the tumor is present within 2 specimens, the maximum tumor size cannot be determined with certainty; however, the tumor is estimated to be at least pT2a, and likely approaches 5 cm in maximum dimension.  Tumor abuts the staple lines of specimens A and B; however, these staple lines are likely in continuity representing the initial wedge resection, and these likely do not represent a true parenchymal margin.  Frozen section FSB2, the en face staple line at stitch, may also represent tumor at the wedge resection line, and FSB2 does show extensive involvement by tumor in the en face section.  However, this orientation is not clear, and correlation with intraoperative findings is recommended.  ??  In the en face vascular margin of the completion lobectomy (slide B3), there is invasive carcinoma focally present in the perivascular soft tissue adjacent to the vessel, and involvement of the adjacent true stapled margin cannot be excluded. This tissue would be immediately subjacent to the stapled vascular margin, with tumor extending to within at least 2 mm of the true margin, cannot exclude focal involvement by invasive carcinoma.  ??  The diagnosis was called to Dr. Shela Commons. Long on 04/10/16 at 4:00pm by Dr. Katrinka Blazing, and the case was discussed.  ??  The case was also reviewed by Dr. Bo Mcclintock and he concurs with the diagnosis.  ??  The frozen section diagnoses are confirmed.  ??     06/06/17 Wedge resection  Final Diagnosis   A: Lung, left lower lobe, wedge resection   - Ill defined nodule of invasive mucinous adenocarcinoma with lepidic spread and spread through air spaces, spanning 1.6 cm in this wedge biopsy, tumor abutting but not definitively involving pleura, clinically recurrent/metastatic lung adenocarcinoma  - Parenchymal margin free  of tumor, tumor is 12 mm from the parenchymal margin   - Two intrapulmonary lymph nodes with no tumor seen (0/2)   ??  B: Lung, left lower lobe, wedge #2  - Three portions of lung parenchyma and pleura with no tumor seen, mild fibrous pleural adhesions present on pleural surface of these specimens     5/19 Pneumonectomy  Final Diagnosis   A: Lung, left lower lobe, lobectomy  - Invasive mucinous adenocarcinoma, 5.5 cm in greatest dimension, clinically recurrent  - Tumor spread through air spaces is identified  - Surgical margins free of tumor  ??  B: Lymph node, 9L, biopsy  - No tumor seen in one lymph node (0/1)  ??     LABS  Lab Results   Component Value Date    WBC 4.5 05/09/2021    HGB 10.4 (L) 05/09/2021    HCT 29.1 (L) 05/09/2021    PLT 281 05/09/2021       Lab Results   Component Value Date    NA 136 05/09/2021    K 3.8 05/09/2021    CL 106 05/09/2021    CO2 26.0 05/09/2021    BUN 18 05/09/2021    CREATININE 1.11 (H) 05/09/2021    GLU 78 05/09/2021    CALCIUM 9.5 05/09/2021    MG 1.6 06/02/2020    PHOS 3.5 08/23/2020       Lab Results   Component Value Date    BILITOT 0.3 05/09/2021    BILIDIR <0.10 01/20/2019    PROT 6.7 05/09/2021    ALBUMIN 3.6 05/09/2021    ALT 11 05/09/2021    AST 20 05/09/2021    ALKPHOS 86 05/09/2021       Lab Results   Component Value Date    PT 12.9 (H) 02/07/2018    INR 1.13 02/07/2018    APTT 29.5 02/07/2018           RADIOLOGY  CT Chest 04/10/21 -   -??Interval progression of multifocal adenocarcinoma with increase in size of pulmonary nodules and masslike opacities in right lung.   ??  -Tiny indeterminate sclerotic lesions in bilateral ribs. Recommend bone scan for better characterization.    CT Chest 04/10/21 -   - no evidence of metastatic disease    STRATA  ERBB2 p.V842I  CTNNB1 p.S37F     PD L-1  TPS 0%    ASSESSMENT AND PLAN: [name] is a 70yoF with history of Stage IB (pT2a pN0 pMx) invasive mucinous adenocarcinoma s/p multiple surgeries, then with progression to Stage IV disease s/p neratinib  through 07/12/20, subsequently transitioned to Enhertu 2/2 PD; post 4C carbo/pem/pembro  s/p 7C maintenance pem/pembro and 10C of pembrolizumab. Patient with PD based on recent imaging.     NSCLC: Due to PD, will move on to carboplatin + abraxane per the phase 3 IFCT-0501 trial comparing paclitaxel and carboplatin combination therapy with single-agent vinorelbine or gemcitabine in patients ?70?years. PFS and overall survival were significantly improved with combination treatment [median OS 10.3 vs 6.2?months; median PFS 6.0 vs 2.8?months].   -- She is now s/p 1C carbo/abraxane with cancellation of D15 due to neutropenia.   -- continue with 21 day cycle, carboplatin day 1, nab-paclitaxel days 1, 8, gcsf support day 8.   -- RTC three weeks with restaging scans, MD appt, tox check, consideration of further therapy     Anola Gurney, AGNP

## 2021-05-09 NOTE — Unmapped (Signed)
Pt completed chemo infusions without difficulty today.  Pt was stable at discharge via self ambulation with husband.

## 2021-05-09 NOTE — Unmapped (Signed)
Lab on 05/09/2021   Component Date Value Ref Range Status    Sodium 05/09/2021 136  135 - 145 mmol/L Final    Potassium 05/09/2021 3.8  3.4 - 4.8 mmol/L Final    Chloride 05/09/2021 106  98 - 107 mmol/L Final    CO2 05/09/2021 26.0  20.0 - 31.0 mmol/L Final    Anion Gap 05/09/2021 4 (A) 5 - 14 mmol/L Final    BUN 05/09/2021 18  9 - 23 mg/dL Final    Creatinine 16/07/9603 1.11 (A) 0.60 - 0.80 mg/dL Final    BUN/Creatinine Ratio 05/09/2021 16   Final    eGFR CKD-EPI (2021) Female 05/09/2021 53 (A) >=60 mL/min/1.53m2 Final    eGFR calculated with CKD-EPI 2021 equation in accordance with [employer] and [employer] of Nephrology Task Force recommendations.    Glucose 05/09/2021 78  70 - 179 mg/dL Final    Calcium 54/06/8118 9.5  8.7 - 10.4 mg/dL Final    Albumin 14/78/2956 3.6  3.4 - 5.0 g/dL Final    Total Protein 05/09/2021 6.7  5.7 - 8.2 g/dL Final    Total Bilirubin 05/09/2021 0.3  0.3 - 1.2 mg/dL Final    AST 21/30/8657 20  <=34 U/L Final    ALT 05/09/2021 11  10 - 49 U/L Final    Alkaline Phosphatase 05/09/2021 86  46 - 116 U/L Final    WBC 05/09/2021 4.5  3.6 - 11.2 10*9/L Final    RBC 05/09/2021 3.25 (A) 3.95 - 5.13 10*12/L Final    HGB 05/09/2021 10.4 (A) 11.3 - 14.9 g/dL Final    HCT 84/69/6295 29.1 (A) 34.0 - 44.0 % Final    MCV 05/09/2021 89.5  77.6 - 95.7 fL Final    MCH 05/09/2021 32.0  25.9 - 32.4 pg Final    MCHC 05/09/2021 35.8  32.0 - 36.0 g/dL Final    RDW 28/41/3244 13.6  12.2 - 15.2 % Final    MPV 05/09/2021 6.9  6.8 - 10.7 fL Final    Platelet 05/09/2021 281  150 - 450 10*9/L Final    Neutrophils % 05/09/2021 75.0  % Final    Lymphocytes % 05/09/2021 9.2  % Final    Monocytes % 05/09/2021 13.6  % Final    Eosinophils % 05/09/2021 0.9  % Final    Basophils % 05/09/2021 1.3  % Final    Absolute Neutrophils 05/09/2021 3.4  1.8 - 7.8 10*9/L Final    Absolute Lymphocytes 05/09/2021 0.4 (A) 1.1 - 3.6 10*9/L Final    Absolute Monocytes 05/09/2021 0.6  0.3 - 0.8 10*9/L Final Absolute Eosinophils 05/09/2021 0.0  0.0 - 0.5 10*9/L Final    Absolute Basophils 05/09/2021 0.1  0.0 - 0.1 10*9/L Final

## 2021-05-10 DIAGNOSIS — C3492 Malignant neoplasm of unspecified part of left bronchus or lung: Principal | ICD-10-CM

## 2021-05-10 MED ORDER — AMOXICILLIN 500 MG TABLET
ORAL_TABLET | Freq: Once | ORAL | 0 refills | 0.00000 days | Status: CP
Start: 2021-05-10 — End: 2021-05-10

## 2021-05-10 NOTE — Unmapped (Signed)
Clinical Pharmacist Practitioner: Telephone Note    Reason for Call - Supportive Care    Ms. Kohli called me regarding gCSF injection. Patient stated she would prefer to get clinic administration rather than home self injection. Will discontinue home Rx & ensure future cycles scheduled in [place].     Additionally, she inquired about Abx ppx prior to dental procedure. Stated that her dentist preferred to do this for immunocompromised patients. Patient notably not neutropenic this week, and will get procedure next week prior to D8 chemotherapy. Given active chemotherapy, will provide one-time Rx, with future doses prescribed by dentist or PCP if indicated.     I spent 15 minutes in direct patient care.    Nicholos Johns, PharmD, CPP  Clinical Pharmacist Practitioner, Thoracic Oncology & Sarcoma  Pager: [phone]

## 2021-05-15 DIAGNOSIS — C3492 Malignant neoplasm of unspecified part of left bronchus or lung: Principal | ICD-10-CM

## 2021-05-15 DIAGNOSIS — Z79899 Other long term (current) drug therapy: Principal | ICD-10-CM

## 2021-05-15 DIAGNOSIS — R9389 Abnormal findings on diagnostic imaging of other specified body structures: Principal | ICD-10-CM

## 2021-05-15 DIAGNOSIS — Z1159 Encounter for screening for other viral diseases: Principal | ICD-10-CM

## 2021-05-15 DIAGNOSIS — R918 Other nonspecific abnormal finding of lung field: Principal | ICD-10-CM

## 2021-05-16 ENCOUNTER — Other Ambulatory Visit: Admit: 2021-05-16 | Discharge: 2021-05-17 | Payer: MEDICARE

## 2021-05-16 ENCOUNTER — Ambulatory Visit: Admit: 2021-05-16 | Discharge: 2021-05-17 | Payer: MEDICARE

## 2021-05-16 DIAGNOSIS — Z1159 Encounter for screening for other viral diseases: Principal | ICD-10-CM

## 2021-05-16 DIAGNOSIS — C3492 Malignant neoplasm of unspecified part of left bronchus or lung: Principal | ICD-10-CM

## 2021-05-16 DIAGNOSIS — R9389 Abnormal findings on diagnostic imaging of other specified body structures: Principal | ICD-10-CM

## 2021-05-16 LAB — CBC W/ AUTO DIFF
BASOPHILS ABSOLUTE COUNT: 0 10*9/L (ref 0.0–0.1)
BASOPHILS RELATIVE PERCENT: 0.9 %
EOSINOPHILS ABSOLUTE COUNT: 0.1 10*9/L (ref 0.0–0.5)
EOSINOPHILS RELATIVE PERCENT: 4.3 %
HEMATOCRIT: 29.9 % — ABNORMAL LOW (ref 34.0–44.0)
HEMOGLOBIN: 10.6 g/dL — ABNORMAL LOW (ref 11.3–14.9)
LYMPHOCYTES ABSOLUTE COUNT: 0.3 10*9/L — ABNORMAL LOW (ref 1.1–3.6)
LYMPHOCYTES RELATIVE PERCENT: 11 %
MEAN CORPUSCULAR HEMOGLOBIN CONC: 35.4 g/dL (ref 32.0–36.0)
MEAN CORPUSCULAR HEMOGLOBIN: 31.4 pg (ref 25.9–32.4)
MEAN CORPUSCULAR VOLUME: 88.7 fL (ref 77.6–95.7)
MEAN PLATELET VOLUME: 7.4 fL (ref 6.8–10.7)
MONOCYTES ABSOLUTE COUNT: 0.2 10*9/L — ABNORMAL LOW (ref 0.3–0.8)
MONOCYTES RELATIVE PERCENT: 5.6 %
NEUTROPHILS ABSOLUTE COUNT: 2.4 10*9/L (ref 1.8–7.8)
NEUTROPHILS RELATIVE PERCENT: 78.2 %
PLATELET COUNT: 232 10*9/L (ref 150–450)
RED BLOOD CELL COUNT: 3.37 10*12/L — ABNORMAL LOW (ref 3.95–5.13)
RED CELL DISTRIBUTION WIDTH: 15 % (ref 12.2–15.2)
WBC ADJUSTED: 3.1 10*9/L — ABNORMAL LOW (ref 3.6–11.2)

## 2021-05-16 MED ADMIN — sodium chloride (NS) 0.9 % infusion: 100 mL/h | INTRAVENOUS | @ 15:00:00

## 2021-05-16 MED ADMIN — PACLitaxeL protein-bound (ABRAXANE) IVPB 166 mg: 100 mg/m2 | INTRAVENOUS | @ 15:00:00 | Stop: 2021-05-16

## 2021-05-16 MED ADMIN — heparin, porcine (PF) 100 unit/mL injection 500 Units: 500 [IU] | INTRAVENOUS | @ 15:00:00 | Stop: 2021-05-17

## 2021-05-16 MED ADMIN — ondansetron (ZOFRAN) tablet 16 mg: 16 mg | ORAL | @ 14:00:00 | Stop: 2021-05-16

## 2021-05-16 NOTE — Unmapped (Signed)
9604: Pt here for scheduled infusion.     1120: Pt tolerated treatment/infusion W/O difficulty. Port de accessed per protocol.   Pt left infusion center ambulatory. NAD, no questions nor complaints voiced at D/C. Pt aware of follow up.

## 2021-05-16 NOTE — Unmapped (Signed)
PT in lab for port access and lab draws, 20 3/4 gauge needle used, blood return brisk, saline looked, Labs sent for analysis.

## 2021-05-17 ENCOUNTER — Institutional Professional Consult (permissible substitution): Admit: 2021-05-17 | Discharge: 2021-05-18 | Payer: MEDICARE

## 2021-05-17 DIAGNOSIS — N183 Stage 3 chronic kidney disease, unspecified whether stage 3a or 3b CKD (CMS-HCC): Principal | ICD-10-CM

## 2021-05-17 DIAGNOSIS — R9389 Abnormal findings on diagnostic imaging of other specified body structures: Principal | ICD-10-CM

## 2021-05-17 DIAGNOSIS — R110 Nausea: Principal | ICD-10-CM

## 2021-05-17 DIAGNOSIS — Z1159 Encounter for screening for other viral diseases: Principal | ICD-10-CM

## 2021-05-17 DIAGNOSIS — T451X5A Adverse effect of antineoplastic and immunosuppressive drugs, initial encounter: Principal | ICD-10-CM

## 2021-05-17 DIAGNOSIS — Z79899 Other long term (current) drug therapy: Principal | ICD-10-CM

## 2021-05-17 DIAGNOSIS — C3492 Malignant neoplasm of unspecified part of left bronchus or lung: Principal | ICD-10-CM

## 2021-05-17 MED ADMIN — pegfilgrastim-bmez (ZIEXTENZO) injection 6 mg: 6 mg | SUBCUTANEOUS | @ 18:00:00 | Stop: 2021-05-17

## 2021-05-17 NOTE — Unmapped (Signed)
Krista Pratt presents to [facility] clinic today for Ziextenzo injection. Ziextenzo given as directed per therapy plan. Pt tolerated injection well. Krista Pratt has no further needs or concerns at this time and was escorted to check out by Clinical research associate.

## 2021-05-18 ENCOUNTER — Telehealth: Admit: 2021-05-18 | Discharge: 2021-05-19 | Payer: MEDICARE

## 2021-05-18 NOTE — Unmapped (Signed)
[facility]  Comprehensive Cancer Support Program  Video Visit  Established Patient         THIS IS A NON-BILLABLE ENCOUNTER     Patient:  Krista Pratt [id number]      Service Date:  05/18/2021      Consulting provider:  Perry Mount, [place]     Reason for contact:  Depression and Anxiety         Encounter Description: This encounter was conducted via live, face-to-face video conference, which was appropriate and reasonable in the setting of State of Emergency due to COVID-19 pandemic and given the patient's presentation at the time.  The patient was physically located in [place].     The patient and/or parent/guardian has been advised of the potential risks and limitations of this mode of treatment and has agreed to be treated using telemedicine. The patient's/patient's family's questions regarding telemedicine have been answered.      If the visit was completed in an ambulatory setting, the patient and/or parent/guardian has also been advised to contact their provider's office for worsening conditions, and seek emergency medical treatment and/or call 911 if the patient deems either necessary.     Assessment:  Krista Pratt is a 70 yo woman with stage IV NSCLC initially diagnosed in 03/2016. Pt is s/p lobectomy LUL and mediastinal LND in 03/2016 and adjuvant chemotherapy; XRT completed in 10/2016. Local regional recurrence found in 03/2017, s/p resection in 06/2017. Imaging in 11/2017 concerning for recurrence and FNA in 03/2018 revealed atypical epithelial cells, s/p pneumonectomy in 01/2018. On 09/16/2018 imaging revealed increase in size in multiple right lung nodules concerning for metastatic disease. Imaging on 04/10/2021 showed disease progression and pt???s treatment was changed..     Risk Assessment:  There were no concerns for pt safety identified during the interview. While future psychiatric events cannot be accurately predicted, the patient does not currently require acute inpatient psychiatric care and does not currently meet [facility] involuntary commitment criteria.     Plan:  Pt was offered ongoing supportive counseling and a follow-up appointment scheduled. Pt was encouraged to contact me if she wishes to change appointment for earlier visit.      Subjective:   [name] has long h/o of depression for which she has taken Wellbutrin managed by her PCP. Today, pt???s mood was stable and she reports she feels she is coping well at this time.     Provided active listening and therapeutic use of silence. Validated pt's thoughts and feelings and normalized as appropriate. Pt was offered ongoing supportive counseling and a follow-up appointment was scheduled.       Objective:     Mental Status Exam:  Appearance:     Appears stated age   Motor:    No abnormal movements   Speech/Language:     Normal rate, volume, tone, fluency   Mood:    Euthymic   Affect:    Mood congruent   Thought process and Associations:    Logical, linear, clear, coherent, goal directed   Abnormal/psychotic thought content:      Denies SI, HI, self harm, delusions, obsessions, paranoid ideation, or ideas of reference   Perceptual disturbances:      Does not endorse auditory or visual hallucinations      Orientation:    Oriented to person, place, time, and general circumstances   Attention and Concentration:    Able to fully concentrate and attend   Memory:  Immediate, short-term, long-term, and recall grossly intact    Fund of knowledge:     Consistent with level of education and development   Insight:      Intact   Judgment:     Intact   Impulse Control:    Intact            Time spent:  55 minutes     Perry Mount, MS  Counselor  Comprehensive Cancer Support Program  [phone] / [phone]           THIS IS A NON-BILLABLE ENCOUNTER

## 2021-05-25 ENCOUNTER — Ambulatory Visit: Payer: Medicare Other | Admitting: Internal Medicine

## 2021-05-25 ENCOUNTER — Other Ambulatory Visit: Payer: Self-pay | Admitting: Internal Medicine

## 2021-05-26 ENCOUNTER — Ambulatory Visit (INDEPENDENT_AMBULATORY_CARE_PROVIDER_SITE_OTHER): Payer: Medicare Other | Admitting: *Deleted

## 2021-05-26 DIAGNOSIS — I10 Essential (primary) hypertension: Secondary | ICD-10-CM | POA: Diagnosis not present

## 2021-05-26 DIAGNOSIS — C3492 Malignant neoplasm of unspecified part of left bronchus or lung: Secondary | ICD-10-CM

## 2021-05-29 ENCOUNTER — Other Ambulatory Visit: Payer: Self-pay

## 2021-05-29 ENCOUNTER — Ambulatory Visit (INDEPENDENT_AMBULATORY_CARE_PROVIDER_SITE_OTHER): Payer: Medicare Other | Admitting: Internal Medicine

## 2021-05-29 ENCOUNTER — Encounter: Payer: Self-pay | Admitting: Internal Medicine

## 2021-05-29 ENCOUNTER — Ambulatory Visit: Admit: 2021-05-29 | Discharge: 2021-05-30 | Payer: MEDICARE

## 2021-05-29 VITALS — BP 118/72 | HR 93 | Temp 96.5°F | Resp 15 | Ht 62.0 in | Wt 142.4 lb

## 2021-05-29 DIAGNOSIS — E559 Vitamin D deficiency, unspecified: Secondary | ICD-10-CM | POA: Diagnosis not present

## 2021-05-29 DIAGNOSIS — I10 Essential (primary) hypertension: Secondary | ICD-10-CM | POA: Diagnosis not present

## 2021-05-29 DIAGNOSIS — N1832 Chronic kidney disease, stage 3b: Secondary | ICD-10-CM

## 2021-05-29 DIAGNOSIS — F32A Depression, unspecified: Secondary | ICD-10-CM

## 2021-05-29 DIAGNOSIS — E7800 Pure hypercholesterolemia, unspecified: Secondary | ICD-10-CM

## 2021-05-29 DIAGNOSIS — F419 Anxiety disorder, unspecified: Secondary | ICD-10-CM

## 2021-05-29 DIAGNOSIS — C3492 Malignant neoplasm of unspecified part of left bronchus or lung: Secondary | ICD-10-CM

## 2021-05-29 DIAGNOSIS — A609 Anogenital herpesviral infection, unspecified: Secondary | ICD-10-CM

## 2021-05-29 MED ORDER — ACYCLOVIR 5 % EX OINT: 1.0000 "application " | TOPICAL_OINTMENT | CUTANEOUS | 1 refills | Status: DC

## 2021-05-29 MED ORDER — CLONAZEPAM 0.5 MG PO TABS: 0.5000 mg | ORAL_TABLET | Freq: Two times a day (BID) | ORAL | 5 refills | Status: DC | PRN

## 2021-05-29 NOTE — Patient Instructions (Addendum)
I have refilled your clonazepam   The topical acyclovir may be EXPENSIVE but has been sent to your pharmacy.  Go find out what it will cost you NOW so we have time to find an alternative route  .[email or web address]

## 2021-05-29 NOTE — Progress Notes (Signed)
Subjective:  Patient ID: Stacy Santana, female    DOB: 07-27-1951  Age: 70 y.o. MRN: [id number]  CC: The primary encounter diagnosis was Pure hypercholesterolemia. Diagnoses of Vitamin D deficiency, Primary hypertension, Stage 3b chronic kidney disease ([place]), Adenocarcinoma of lung, stage 4, left ([place]), Anxiety and depression, and HSV (herpes simplex virus) anogenital infection were also pertinent to this visit.  HPI Stacy Santana presents for 6 month follow up on chronic conditions including reactive depression,  hypertension, CKD and stage IV lung CA   This visit occurred during the SARS-CoV-2 public health emergency.  Safety protocols were in place, including screening questions prior to the visit, additional usage of staff PPE, and extensive cleaning of exam room while observing appropriate contact time as indicated for disinfecting solutions.    1) Advanced Lung CA: s/p left pneumonectomy. In 2017. Her cancer treatment is managed by [facility]. Progression was noted in July with an increase in the size of the right lung nodules;  her  therapy was changed to a somewhat off label regimen, with her Her last infusion occurring in mid august and her most recent CT done today at [facility]  results give to patient noting stable disease.    PR FOSAPREPITANT INJECTION   PR PACLITAXEL PROTEIN BOUND   PR CARBOPLATIN INJECTION   PR INJ PEGFILGRASTIM-BMEZ 0.5MG      Repeat CT for staging was done today at [facility] and notes no progression of right lung nodules  LUNGS AND AIRWAYS: Sequelae of left pneumonectomy with stable appearing surgical margins. Redemonstrated multiple right-sided nodules, for example:  -2.6 x 2.1 cm right apical (6:49), previously 3.1 x 2.6 cm when measured similarly  -2.0 x 1.2 cm right lower lobe, superior segment (6:63), previously 2.4 x 1.8 cm  -1.2 x 0.9 cm right lower lobe, lateral basilar segment (6:118), previously 1.3 x 1.0 cm    Tolerating infusion "ok:  had to have a Dose  reduction was required due to drop in WBC.  Some constipation followed by  one bout of liquid stools. Received Neulastin  Reviewed labs via Epic. Anemia becomes symptomatic in the 9's.   No weight loss.   Salvage chemotherapy working but only until there is a change.  Reviewed dprior trials of  Took Neulaxin for 1 year without side effects.     2) Depression:  taking wellbutrin ("doing as good as I can") had a self described "pity party"  that lasted 2 weeks before she was able to  spend time with an old dear friend,  which helped considerably.  She is mostly worried about her son Darnelle Maffucci  and his  ongoing health issues ;  he likes to do things "his way" and she worries about his motivations after she passes   3) anxiet:y:  using clonazepam rarely.  Last refill Feb 2021.  4) Genital herpes : no outbreaks recently but worried it would happen with the new chemotherapy and leukopenia .  Wants topical zovirax  5) HTN:  home readings are < 130/80 on 75 mg metoprolol.   Outpatient Medications Prior to Visit  Medication Sig Dispense Refill   azelastine (ASTELIN) 0.1 % nasal spray PLACE 1 SPRAY INTO BOTH NOSTRILS AT BEDTIME AS NEEDED 30 mL 6   buPROPion (WELLBUTRIN XL) 150 MG 24 hr tablet Take 1 tablet (150 mg total) by mouth daily. I 90 tablet 1   levothyroxine (SYNTHROID) 75 MCG tablet TAKE 1 TABLET(75 MCG) BY MOUTH DAILY BEFORE BREAKFAST 90 tablet 0  loperamide (IMODIUM) 2 MG capsule Take by mouth as needed for diarrhea or loose stools.     loratadine (CLARITIN) 10 MG tablet Take 10 mg by mouth daily.     metoprolol succinate (TOPROL-XL) 25 MG 24 hr tablet TAKE 3 TABLETS(75 MG) BY MOUTH DAILY WITH OR IMMEDIATELY FOLLOWING A MEAL 270 tablet 1   Multiple Vitamin (MULTIVITAMIN) tablet Take 1 tablet by mouth daily.     OLANZapine (ZYPREXA) 5 MG tablet Take 5 mg by mouth at bedtime.     ondansetron (ZOFRAN) 8 MG tablet Take 8 mg by mouth every 8 (eight) hours as needed.      prochlorperazine (COMPAZINE)  5 MG tablet      vitamin B-12 (CYANOCOBALAMIN) 100 MCG tablet Take 100 mcg by mouth daily.     clonazePAM (KLONOPIN) 0.5 MG tablet Take 1 tablet (0.5 mg total) by mouth 2 (two) times daily as needed for anxiety. 60 tablet 5   AMERICAN GINSENG PO Take 1,000 mg by mouth 2 (two) times daily.     cyanocobalamin (,VITAMIN B-12,) 1000 MCG/ML injection Inject 1 mL into skin once a week for four weeks. 4 mL 0   dexamethasone (DECADRON) 4 MG tablet Take by mouth.     fluconazole (DIFLUCAN) 150 MG tablet Take 1 tablet (150 mg total) by mouth daily. 2 tablet 2   folic acid (FOLVITE) 381 MCG tablet Take by mouth.     loperamide (IMODIUM) 2 MG capsule Take 4 mg by mouth once.      OLANZapine (ZYPREXA) 5 MG tablet Take by mouth.     No facility-administered medications prior to visit.    Review of Systems;  Patient denies headache, fevers, malaise, unintentional weight loss, skin rash, eye pain, sinus congestion and sinus pain, sore throat, dysphagia,  hemoptysis , cough, dyspnea, wheezing, chest pain, palpitations, orthopnea, edema, abdominal pain, nausea, melena, diarrhea, constipation, flank pain, dysuria, hematuria, urinary  Frequency, nocturia, numbness, tingling, seizures,  Focal weakness, Loss of consciousness,  Tremor, insomnia, depression, anxiety, and suicidal ideation.      Objective:  BP 118/72 (BP Location: Left Arm, Patient Position: Sitting, Cuff Size: Normal)   Pulse 93   Temp (!) 96.5 F (35.8 C) (Temporal)   Resp 15   Ht 5\' 2"  (1.575 m)   Wt 142 lb 6.4 oz (64.6 kg)   SpO2 99%   BMI 26.05 kg/m   BP Readings from Last 3 Encounters:  05/29/21 118/72  11/25/20 126/78  05/12/20 128/72    Wt Readings from Last 3 Encounters:  05/29/21 142 lb 6.4 oz (64.6 kg)  11/25/20 143 lb (64.9 kg)  10/27/20 140 lb (63.5 kg)    General appearance: alert, cooperative and appears stated age Ears: normal TM's and external ear canals both ears Throat: lips, mucosa, and tongue normal; teeth  and gums normal Neck: no adenopathy, no carotid bruit, supple, symmetrical, trachea midline and thyroid not enlarged, symmetric, no tenderness/mass/nodules Back: symmetric, no curvature. ROM normal. No CVA tenderness. Lungs: clear to auscultation bilaterally Heart: regular rate and rhythm, S1, S2 normal, no murmur, click, rub or gallop Abdomen: soft, non-tender; bowel sounds normal; no masses,  no organomegaly Pulses: 2+ and symmetric Skin: Skin color, texture, turgor normal. No rashes or lesions Lymph nodes: Cervical, supraclavicular, and axillary nodes normal.  Lab Results  Component Value Date   HGBA1C 5.0 08/16/2020   HGBA1C 5.6 06/01/2020   HGBA1C 5.2 01/26/2020    Lab Results  Component Value Date   CREATININE 1.34 (  H) 08/16/2020   CREATININE 1.34 (H) 08/16/2020   CREATININE 1.4 (A) 08/01/2020    Lab Results  Component Value Date   WBC 7.1 05/28/2019   HGB 12.6 05/28/2019   HCT 36.7 05/28/2019   PLT 316.0 05/28/2019   GLUCOSE 88 08/16/2020   GLUCOSE 88 08/16/2020   CHOL 141 06/01/2020   TRIG 147.0 06/01/2020   HDL 43.70 06/01/2020   LDLDIRECT 91.0 11/05/2016   LDLCALC 68 06/01/2020   ALT 17 08/16/2020   AST 24 08/16/2020   NA 136 08/16/2020   NA 136 08/16/2020   K 4.0 08/16/2020   K 4.0 08/16/2020   CL 102 08/16/2020   CL 102 08/16/2020   CREATININE 1.34 (H) 08/16/2020   CREATININE 1.34 (H) 08/16/2020   BUN 17 08/16/2020   BUN 17 08/16/2020   CO2 26 08/16/2020   CO2 26 08/16/2020   TSH 0.74 08/16/2020   HGBA1C 5.0 08/16/2020   MICROALBUR 3.4 (H) 09/09/2018    DG Bone Density  Result Date: 01/08/2017 EXAM: DUAL X-RAY ABSORPTIOMETRY (DXA) FOR BONE MINERAL DENSITY IMPRESSION: Dear Dr. Derrel Nip, Your patient Jordan Caraveo completed a BMD test on 01/08/2017 using the [place] (analysis version: 14.10) manufactured by [employer]. The following summarizes the results of our evaluation. PATIENT BIOGRAPHICAL: Name: Senetra, Dillin Patient ID:  [id number] Birth Date: 12-13-1950 Height: 62.0 in. Gender: Female Exam Date: 01/08/2017 Weight: 176.0 lbs. Indications: Caucasian, Family Hx of Osteoporosis, lung cancer, Postmenopausal, Previous Chemo and Radiation Fractures: Treatments: claritin, LEVOTHYROXINE, Vitamin D ASSESSMENT: The BMD measured at Femur Neck Left is 0.826 g/cm2 with a T-score of -1.5. This patient is considered osteopenic according to [facility]) criteria. Site Region Measured Measured WHO Young Adult BMD Date       Age      Classification T-score AP Spine L1-L4 01/08/[age] 67.9 Normal -0.1 1.177 g/cm2 AP Spine L1-L4 01/19/2013 64.0 Normal -0.1 1.180 g/cm2 AP Spine L1-L4 01[birth year]24/2010 61.0 Normal -0.3 1.158 g/cm2 AP Spine L1-L4 01/24/2010 61.0 Normal -0.3 1.158 g/cm2 DualFemur Neck Left 01/08/2017 67.9 Osteopenia -1.5 0.826 g/cm2 DualFemur Neck Left 01/19/2013 62.0 Osteopenia -1.2 0.870 g/cm2 DualFemur Neck Left 01/24/2010 61.0 Normal -1.0 0.904 g/cm2 DualFemur Neck Left 01/24/2010 61.0 Normal -1.0 0.904 g/cm2 World Health Organization [facility]) criteria for post-menopausal, Caucasian Women: Normal:       T-score at or above -1 SD Osteopenia:   T-score between -1 and -2.5 SD Osteoporosis: T-score at or below -2.5 SD RECOMMENDATIONS: [place] recommends that FDA-approved medical therapies be considered in postmenopausal women and men age 61 or older with a: 1. Hip or vertebral (clinical or morphometric) fracture. 2. T-score of < -2.5 at the spine or hip. 3. Ten-year fracture probability by FRAX of 3% or greater for hip fracture or 20% or greater for major osteoporotic fracture. All treatment decisions require clinical judgment and consideration of individual patient factors, including patient preferences, co-morbidities, previous drug use, risk factors not captured in the FRAX model (e.g. falls, vitamin D deficiency, increased bone turnover, interval significant decline in bone density) and possible under -  or over-estimation of fracture risk by FRAX. All patients should ensure an adequate intake of dietary calcium (1200 mg/d) and vitamin D (800 IU daily) unless contraindicated. FOLLOW-UP: People with diagnosed cases of osteoporosis or at high risk for fracture should have regular bone mineral density tests. For patients eligible for Medicare, routine testing is allowed once every 2 years. The testing frequency can be increased to one year  for patients who have rapidly progressing disease, those who are receiving or discontinuing medical therapy to restore bone mass, or have additional risk factors. I have reviewed this report, and agree with the above findings. [facility] Dear Dr. Derrel Nip, Your patient HILARY MILKS completed a FRAX assessment on 01/08/2017 using the [place] (analysis version: 14.10) manufactured by [employer]. The following summarizes the results of our evaluation. PATIENT BIOGRAPHICAL: Name: Olene, Godfrey Patient ID: [id number] Birth Date: 1950/12/21 Height:    62.0 in. Gender:     [age]    [age]       Weight:    176.0 lbs. Ethnicity:  White                            Exam Date: 01/08/2017 FRAX* RESULTS:  (version: 3.5) 10-year Probability of Fracture1 Major Osteoporotic Fracture2 Hip Fracture 9.2% 1.1% Population: [place] (Caucasian) Risk Factors: None Based on Femur (Left) Neck BMD 1 -The 10-year probability of fracture may be lower than reported if the patient has received treatment. 2 -Major Osteoporotic Fracture: Clinical Spine, Forearm, Hip or Shoulder *FRAX is a Materials engineer of the [employer] for Metabolic Bone Disease, a [place] (WHO) [facility]. ASSESSMENT: The probability of a major osteoporotic fracture is 9.2% within the next ten years. The probability of a hip fracture is 1.1% within the next ten years. . Electronically Signed   By: Dorise Bullion III M.D   On: 01/08/2017 14:46     Assessment & Plan:   Problem List Items Addressed This Visit       Unprioritized   Hypertension (Chronic)    Taking 75 mg toprol xl and home readings are < 140/80. Recommend treating prn  readings at home that are 381 systolic or higher  With an extra 25 mg metoprolol,  Or 2.5 mg amlodipine if pulse is  < 65      Pure hypercholesterolemia - Primary   Relevant Orders   Lipid panel   Comprehensive metabolic panel   Anxiety and depression    Managed with wellbutrin and prn clonazepam.  No changes today ; symptoms are controlled      Adenocarcinoma of lung, stage 4, left ([place])    She had progression noted this spring, multi drug chemo regimen was initiated with good response and only mild intolerance.  Infusions were reduced due to neutropenia        CKD (chronic kidney disease) stage 3, GFR 30-59 ml/min (HCC)    Stable, managed by [facility].  PTH and phosphorus were checked in November   Lab Results  Component Value Date   CREATININE 1.34 (H) 08/16/2020   CREATININE 1.34 (H) 08/16/2020   Lab Results  Component Value Date   PTH 52 06/01/2020   CALCIUM 9.7 08/16/2020   CALCIUM 9.7 08/16/2020   CAION 5.45 06/18/2018   PHOS 4.8 (H) 06/01/2020         HSV (herpes simplex virus) anogenital infection    Refilling topical zovirax per patient request for future prn use      Relevant Medications   acyclovir ointment (ZOVIRAX) 5 %   Other Visit Diagnoses     Vitamin D deficiency       Relevant Orders   VITAMIN D 25 Hydroxy (Vit-D Deficiency, Fractures)      Meds ordered this encounter  Medications  clonazePAM (KLONOPIN) 0.5 MG tablet    Sig: Take 1 tablet (0.5 mg total) by mouth 2 (two) times daily as needed for anxiety.    Dispense:  60 tablet    Refill:  5   acyclovir ointment (ZOVIRAX) 5 %    Sig: Apply 1 application topically every 3 (three) hours.    Dispense:  15 g    Refill:  1     I spent 30 minutes dedicated to the care of this patient on  the date of this encounter to include pre-visit review of her  medical history,  recent oncology visit,  CT scan done today at [facility] Face-to-face time with the patient , and post visit ordering of testing and therapeutics.  Medications Discontinued During This Encounter  Medication Reason   AMERICAN GINSENG PO    cyanocobalamin (,VITAMIN B-12,) 1000 MCG/ML injection    dexamethasone (DECADRON) 4 MG tablet    fluconazole (DIFLUCAN) 458 MG tablet    folic acid (FOLVITE) 099 MCG tablet    loperamide (IMODIUM) 2 MG capsule    OLANZapine (ZYPREXA) 5 MG tablet    clonazePAM (KLONOPIN) 0.5 MG tablet Reorder    Follow-up: Return in about 6 months (around 11/28/2021).   Crecencio Mc, MD

## 2021-05-29 NOTE — Chronic Care Management (AMB) (Signed)
Chronic Care Management   CCM RN Visit Note  05/29/2021 Name: Stacy Santana MRN: [id number] DOB: 08/23/1951  Subjective: Stacy Santana is a 70 y.o. year old female who is a primary care patient of Derrel Nip, Aris Everts, MD. The care management team was consulted for assistance with disease management and care coordination needs.    Engaged with patient by telephone for follow up visit in response to provider referral for case management and/or care coordination services.   Consent to Services:  The patient was given information about Chronic Care Management services, agreed to services, and gave verbal consent prior to initiation of services.  Please see initial visit note for detailed documentation.   Patient agreed to services and verbal consent obtained.   Assessment: Review of patient past medical history, allergies, medications, health status, including review of consultants reports, laboratory and other test data, was performed as part of comprehensive evaluation and provision of chronic care management services.   SDOH (Social Determinants of Health) assessments and interventions performed:    CCM Care Plan  Allergies  Allergen Reactions   Amlodipine     edema   Codeine     Nausea and vomiting    Outpatient Encounter Medications as of 05/26/2021  Medication Sig Note   AMERICAN GINSENG PO Take 1,000 mg by mouth 2 (two) times daily.    azelastine (ASTELIN) 0.1 % nasal spray PLACE 1 SPRAY INTO BOTH NOSTRILS AT BEDTIME AS NEEDED    buPROPion (WELLBUTRIN XL) 150 MG 24 hr tablet Take 1 tablet (150 mg total) by mouth daily. I    clonazePAM (KLONOPIN) 0.5 MG tablet Take 1 tablet (0.5 mg total) by mouth 2 (two) times daily as needed for anxiety.    cyanocobalamin (,VITAMIN B-12,) 1000 MCG/ML injection Inject 1 mL into skin once a week for four weeks.    dexamethasone (DECADRON) 4 MG tablet Take by mouth. 12/14/2020: Reports taking post Chemo as needed   fluconazole (DIFLUCAN) 150 MG  tablet Take 1 tablet (150 mg total) by mouth daily. 12/14/2020: Reports taking with steroids post chemo infusions   folic acid (FOLVITE) 242 MCG tablet Take by mouth.    levothyroxine (SYNTHROID) 75 MCG tablet TAKE 1 TABLET(75 MCG) BY MOUTH DAILY BEFORE BREAKFAST    loperamide (IMODIUM) 2 MG capsule Take 4 mg by mouth once.     loratadine (CLARITIN) 10 MG tablet Take 10 mg by mouth daily.    metoprolol succinate (TOPROL-XL) 25 MG 24 hr tablet TAKE 3 TABLETS(75 MG) BY MOUTH DAILY WITH OR IMMEDIATELY FOLLOWING A MEAL    Multiple Vitamin (MULTIVITAMIN) tablet Take 1 tablet by mouth daily.    OLANZapine (ZYPREXA) 5 MG tablet Take by mouth.    ondansetron (ZOFRAN) 8 MG tablet Take 8 mg by mouth every 8 (eight) hours as needed.     prochlorperazine (COMPAZINE) 5 MG tablet     vitamin B-12 (CYANOCOBALAMIN) 100 MCG tablet Take 100 mcg by mouth daily.    No facility-administered encounter medications on file as of 05/26/2021.    Patient Active Problem List   Diagnosis Date Noted   Acute renal failure superimposed on stage 3 chronic kidney disease ([place]) 06/01/2020   CKD (chronic kidney disease) stage 3, GFR 30-59 ml/min ([place]) 05/14/2020   Choledocholithiasis 05/14/2020   Weight loss of more than 10% body weight 05/14/2020   Fingernail abnormalities 05/12/2020   Chemotherapy-induced nausea 02/08/2020   Drug-induced pruritus 11/07/2019   MRSA colonization 07/27/2019   Nasal mucositis (ulcerative) 05/25/2019  Educated about COVID-19 virus infection 02/24/2019   Left carotid bruit 05/09/2018   Tachycardia 08/27/2016   Adenocarcinoma of lung, stage 4, left ([place]) 04/11/2016   Right shoulder pain 01/06/2015   Osteoarthritis of both knees 01/06/2015   Colon cancer screening 04/07/2014   Screening for breast cancer 07/16/2012   Anxiety and depression 07/16/2012   GERD (gastroesophageal reflux disease) 07/16/2012   Hypothyroidism 01/15/2012   Pure hypercholesterolemia 12/12/2011   Endometriosis     Carpal tunnel syndrome    Degenerative joint disease    Hypertension 07/18/2011   Allergic rhinitis 07/18/2011    Conditions to be addressed/monitored:HTN and Lung CA  Care Plan : Hypertension (Adult)  Updates made by Leona Singleton, RN since 05/29/2021 12:00 AM     Problem: Hypertension (Hypertension)   Priority: Medium     Long-Range Goal: Patient will report monitoring blood pressures at least 3 times a week within the next 90 days.   Start Date: 12/14/2020  Expected End Date: 09/29/2021  This Visit's Progress: On track  Recent Progress: On track  Priority: Medium  Note:   Objective:  Last practice recorded BP readings:  BP Readings from Last 3 Encounters:  11/25/20 126/78  05/12/20 128/72  02/05/20 120/82  Current Barriers:  Knowledge Deficits related to basic understanding of hypertension pathophysiology and self care management as evidenced by patient reporting blood pressure elevations when attending chemotherapy appointments.  States she is happy about blood pressure readings lately.  Ranges have been 110-120/80's.  114/58 last reading.  Denies any hyper or hypotension episodes. Case Manager Clinical Goal(s):  patient will verbalize understanding of plan for hypertension management patient will demonstrate improved adherence to prescribed treatment plan for hypertension as evidenced by taking all medications as prescribed, monitoring and recording blood pressure as directed, adhering to low sodium/DASH diet Interventions:  Collaboration with Crecencio Mc, MD regarding development and update of comprehensive plan of care as evidenced by provider attestation and co-signature Inter-disciplinary care team collaboration (see longitudinal plan of care) Evaluation of current treatment plan related to hypertension self management and patient's adherence to plan as established by provider. Reviewed medications with patient and discussed importance of compliance Discussed  plans with patient for ongoing care management follow up and provided patient with direct contact information for care management team Advised patient, providing education and rationale, to monitor blood pressure at least 3 times a week and record, calling PCP for findings outside established parameters.  Provided patient with emotional support and empathy Encouraged patient to monitor blood pressures at least 3 times a week and record pressures in log for provider review at medical appointments Discussed heart healthy diet and importance of eating well balanced diet, encouraged to incorporated and eat more vegetables in diet Reviewed recent blood pressure readings and congratulated patient on ranges Patient Goals/Self-Care Activities: Check blood pressure 3 times per week Write blood pressure results in a log for provider review Notify provider for sustained elevations in blood pressure Eat more vegetables in you diet Follow Up Plan: The care management team will reach out to the patient again over the next 45 business days.        Care Plan : Cancer Treatment Phase (Adult)  Updates made by Leona Singleton, RN since 05/29/2021 12:00 AM     Problem: Psychosocial Response to continued need for lung cancer treatment   Priority: Medium     Long-Range Goal: Patient will report ease of acknowledging feelings and accepting of help related to cotninued  need for chemotherapy realted to lung cancer in the next 90 days.   Start Date: 12/14/2020  Expected End Date: 09/29/2021  This Visit's Progress: On track  Recent Progress: On track  Priority: Medium  Note:   Current Barriers:  Difficulty Self Health Maintenance related to continued need for chemotherapy related to lung cancer diagnosis.  Patient reporting she continues with new chemotherapy regime.  Continues to  tolerate so far without any difficulties.  Occasional nausea managed with medication. Clinical Goal(s):  Collaboration with  Crecencio Mc, MD regarding development and update of comprehensive plan of care as evidenced by provider attestation and co-signature Inter-disciplinary care team collaboration (see longitudinal plan of care) patient will work with care management team to address care coordination and chronic disease management needs related to emotional support of continued treatment of lung cancer.   Interventions:  Evaluation of current treatment plan related to  lung cancer ,  self-management and patient's adherence to plan as established by provider. Collaboration with Crecencio Mc, MD regarding development and update of comprehensive plan of care as evidenced by provider attestation       and co-signature Inter-disciplinary care team collaboration (see longitudinal plan of care) Discussed plans with patient for ongoing care management follow up and provided patient with direct contact information for care management team Counseling provided and decision-making supported Family involvement promoted and patient encouraged to ask for help when needed and she is not able to complete task Self-care encouraged and  self-reflection promoted, provided patient with emotional support and empathy Verbalization of feelings encouraged and encouraged to discuss with family; encouraged patient to continue to seek assistance and support from Palliative team at [place] promoted Quality of sleep assessed and Relaxation techniques promoted Discussed periods of depression and encouraged continued use of Hope Worker/therapist Reassured patient periods of being down/frustrated is normal and ok as long as she is using resources to get through them Fall precautions and preventions reviewed and discussed Congratulated patient on continued upbeat positive attitude Patient [place] Activities: Eat healthy Get a least 8 hours of sleep at night  Ask for help when  needing it (don't be mad at yourself for needing assistance) Fall precautions and preventions, move slowly, sitting when feeling dizzy Allow yourself time to deal with your emotions Follow Up Plan: The care management team will reach out to the patient again over the next 45 business days.        Plan:The care management team will reach out to the patient again over the next 45 business days.  Hubert Azure RN, MSN RN Care Management Coordinator [place] [phone] [email or web address]

## 2021-05-29 NOTE — Patient Instructions (Signed)
Visit Information  PATIENT GOALS:  Goals Addressed             This Visit's Progress    (RNCM) Manage Fatigue (Tiredness- Cancer Treatment)   On track    Timeframe:  Long-Range Goal Priority:  Medium Start Date:   12/14/20                          Expected End Date:   09/29/21                    Follow Up Date 06/28/21    Eat healthy Get a least 8 hours of sleep at night  Ask for help when needing it (don't be mad at yourself for needing assistance) Fall precautions and preventions, move slowly, sitting when feeling dizzy Allow yourself time to deal with your emotions Continue to work with Palliative Care Team when needed  Why is this important?   Cancer treatment and its side effects can drain your energy. It can keep you from doing things you would like to do.  There are many things that you can do to manage fatigue.    Notes:      (RNCM) Track and Manage My Blood Pressure-Hypertension   On track    Timeframe:  Long-Range Goal Priority:  Medium Start Date:   12/14/20                          Expected End Date:  11/28/21                     Follow Up Date 06/28/21    Check blood pressure 3 times per week Write blood pressure results in a log for provider review Notify provider for sustained elevations in blood pressure Eat more vegetables in you diet  Why is this important?   You won't feel high blood pressure, but it can still hurt your blood vessels.  High blood pressure can cause heart or kidney problems. It can also cause a stroke.  Making lifestyle changes like losing a little weight or eating less salt will help.  Checking your blood pressure at home and at different times of the day can help to control blood pressure.  If the doctor prescribes medicine remember to take it the way the doctor ordered.  Call the office if you cannot afford the medicine or if there are questions about it.     Notes:         Patient verbalizes understanding of instructions  provided today and agrees to view in [place].   The care management team will reach out to the patient again over the next 45 business days.   Hubert Azure RN, MSN RN Care Management Coordinator [place] [phone] [email or web address]

## 2021-05-30 ENCOUNTER — Other Ambulatory Visit: Admit: 2021-05-30 | Discharge: 2021-05-31 | Payer: MEDICARE

## 2021-05-30 ENCOUNTER — Ambulatory Visit: Admit: 2021-05-30 | Discharge: 2021-05-31 | Payer: MEDICARE | Attending: Adult Health | Primary: Adult Health

## 2021-05-30 ENCOUNTER — Ambulatory Visit: Admit: 2021-05-30 | Discharge: 2021-05-31 | Payer: MEDICARE

## 2021-05-30 DIAGNOSIS — A609 Anogenital herpesviral infection, unspecified: Secondary | ICD-10-CM | POA: Insufficient documentation

## 2021-05-30 DIAGNOSIS — C3490 Malignant neoplasm of unspecified part of unspecified bronchus or lung: Principal | ICD-10-CM

## 2021-05-30 DIAGNOSIS — R9389 Abnormal findings on diagnostic imaging of other specified body structures: Principal | ICD-10-CM

## 2021-05-30 DIAGNOSIS — C3492 Malignant neoplasm of unspecified part of left bronchus or lung: Principal | ICD-10-CM

## 2021-05-30 DIAGNOSIS — Z1159 Encounter for screening for other viral diseases: Principal | ICD-10-CM

## 2021-05-30 LAB — COMPREHENSIVE METABOLIC PANEL
ALBUMIN: 3.6 g/dL (ref 3.4–5.0)
ALKALINE PHOSPHATASE: 97 U/L (ref 46–116)
ALT (SGPT): 10 U/L (ref 10–49)
ALT: 9 U/L (ref 0–35)
ANION GAP: 4 mmol/L — ABNORMAL LOW (ref 5–14)
AST (SGOT): 18 U/L (ref <=34)
AST: 17 U/L (ref 0–37)
Albumin: 4.2 g/dL (ref 3.5–5.2)
Alkaline Phosphatase: 97 U/L (ref 39–117)
BILIRUBIN TOTAL: 0.3 mg/dL (ref 0.3–1.2)
BLOOD UREA NITROGEN: 17 mg/dL (ref 9–23)
BUN / CREAT RATIO: 15
BUN: 20 mg/dL (ref 6–23)
CALCIUM: 9 mg/dL (ref 8.7–10.4)
CHLORIDE: 109 mmol/L — ABNORMAL HIGH (ref 98–107)
CO2: 25 mEq/L (ref 19–32)
CO2: 25 mmol/L (ref 20.0–31.0)
CREATININE: 1.16 mg/dL — ABNORMAL HIGH
Calcium: 9.5 mg/dL (ref 8.4–10.5)
Chloride: 104 mEq/L (ref 96–112)
Creatinine, Ser: 1.27 mg/dL — ABNORMAL HIGH (ref 0.40–1.20)
EGFR CKD-EPI (2021) FEMALE: 50 mL/min/{1.73_m2} — ABNORMAL LOW (ref >=60)
GFR: 42.29 mL/min — ABNORMAL LOW (ref >60.00)
GLUCOSE RANDOM: 85 mg/dL (ref 70–179)
Glucose, Bld: 89 mg/dL (ref 70–99)
POTASSIUM: 3.8 mmol/L (ref 3.4–4.8)
PROTEIN TOTAL: 6.4 g/dL (ref 5.7–8.2)
Potassium: 3.8 mEq/L (ref 3.5–5.1)
SODIUM: 138 mmol/L (ref 135–145)
Sodium: 139 mEq/L (ref 135–145)
Total Bilirubin: 0.3 mg/dL (ref 0.2–1.2)
Total Protein: 6.6 g/dL (ref 6.0–8.3)

## 2021-05-30 LAB — LIPID PANEL
Cholesterol: 188 mg/dL (ref 0–200)
HDL: 52.6 mg/dL (ref >39.00)
LDL Cholesterol: 102 mg/dL — ABNORMAL HIGH (ref 0–99)
NonHDL: 135.2
Total CHOL/HDL Ratio: 4
Triglycerides: 166 mg/dL — ABNORMAL HIGH (ref 0.0–149.0)
VLDL: 33.2 mg/dL (ref 0.0–40.0)

## 2021-05-30 LAB — VITAMIN D 25 HYDROXY (VIT D DEFICIENCY, FRACTURES): VITD: 33.78 ng/mL (ref 30.00–100.00)

## 2021-05-30 LAB — CBC W/ AUTO DIFF
BASOPHILS ABSOLUTE COUNT: 0.1 10*9/L (ref 0.0–0.1)
BASOPHILS RELATIVE PERCENT: 1 %
EOSINOPHILS ABSOLUTE COUNT: 0 10*9/L (ref 0.0–0.5)
EOSINOPHILS RELATIVE PERCENT: 0.4 %
HEMATOCRIT: 24.9 % — ABNORMAL LOW (ref 34.0–44.0)
HEMOGLOBIN: 8.9 g/dL — ABNORMAL LOW (ref 11.3–14.9)
LYMPHOCYTES ABSOLUTE COUNT: 0.3 10*9/L — ABNORMAL LOW (ref 1.1–3.6)
LYMPHOCYTES RELATIVE PERCENT: 5.3 %
MEAN CORPUSCULAR HEMOGLOBIN CONC: 35.7 g/dL (ref 32.0–36.0)
MEAN CORPUSCULAR HEMOGLOBIN: 32 pg (ref 25.9–32.4)
MEAN CORPUSCULAR VOLUME: 89.9 fL (ref 77.6–95.7)
MEAN PLATELET VOLUME: 7 fL (ref 6.8–10.7)
MONOCYTES ABSOLUTE COUNT: 0.6 10*9/L (ref 0.3–0.8)
MONOCYTES RELATIVE PERCENT: 12 %
NEUTROPHILS ABSOLUTE COUNT: 4.4 10*9/L (ref 1.8–7.8)
NEUTROPHILS RELATIVE PERCENT: 81.3 %
PLATELET COUNT: 158 10*9/L (ref 150–450)
RED BLOOD CELL COUNT: 2.76 10*12/L — ABNORMAL LOW (ref 3.95–5.13)
RED CELL DISTRIBUTION WIDTH: 16.5 % — ABNORMAL HIGH (ref 12.2–15.2)
WBC ADJUSTED: 5.4 10*9/L (ref 3.6–11.2)

## 2021-05-30 MED ADMIN — heparin, porcine (PF) 100 unit/mL injection 500 Units: 500 [IU] | INTRAVENOUS | @ 18:00:00 | Stop: 2021-05-31

## 2021-05-30 MED ADMIN — fosaprepitant (EMEND) 150 mg in sodium chloride (NS) 0.9 % 100 mL IVPB: 150 mg | INTRAVENOUS | @ 15:00:00 | Stop: 2021-05-30

## 2021-05-30 MED ADMIN — PACLitaxeL protein-bound (ABRAXANE) IVPB 166 mg: 100 mg/m2 | INTRAVENOUS | @ 16:00:00 | Stop: 2021-05-30

## 2021-05-30 MED ADMIN — sodium chloride (NS) 0.9 % infusion: 100 mL/h | INTRAVENOUS | @ 16:00:00

## 2021-05-30 MED ADMIN — ondansetron (ZOFRAN) tablet 24 mg: 24 mg | ORAL | @ 15:00:00 | Stop: 2021-05-30

## 2021-05-30 MED ADMIN — sodium chloride 0.9% (NS) bolus 500 mL: 500 mL | INTRAVENOUS | @ 15:00:00 | Stop: 2021-05-30

## 2021-05-30 MED ADMIN — dexAMETHasone (DECADRON) tablet 12 mg: 12 mg | ORAL | @ 15:00:00 | Stop: 2021-05-30

## 2021-05-30 MED ADMIN — CARBOplatin (PARAPLATIN) 346.5 mg in sodium chloride (NS) 0.9 % 250 mL IVPB: 346.5 mg | INTRAVENOUS | @ 17:00:00 | Stop: 2021-05-30

## 2021-05-30 NOTE — Unmapped (Signed)
Referring Physician: Reita Chard, Agnp  [address]  [place],  [place] [address].   PCP: Sherlene Shams, MD  Medical Oncologist: Eloise Harman, MD  Radiation Oncologist: Rubye Beach, MD  Thoracic Surgeon: Merrie Roof, MD    Reason for visit: Krista Pratt was initially seen at the request of Dr. Jacqulyn Bath for a thoracic medical oncology opinion regarding new diagnosis of NSCLC.    DIAGNOSIS: NSCLC, adenocarcinoma     STAGE: initially pT2a N0, now stage IV    HPI:  Krista Pratt is a 70 y.o. female former smoker with a 12 pack-year smoking history and past medical history of HTN, hypothyroidism, OA, depression and glaucoma who was recently diagnosed with a pT2a N0 lung adenocarcinoma.     - 08/2015 - persistent cough lead to a CT, which found a mass  - bronch-mediated Bx of LUL mass showed non-specific inflammation  - Followed with serial CT's  - 03/2016 - CT showed interval increase in size of LUL mass (extending from just SUP to L hilum to apex)  - Pt was asymptomatic at the time  - 03/2016 - PFT's nl  - PET showed FDG-avid lesion in LUL, no hot LN's or distant mets  - 04/04/16 (Dr. Jacqulyn Bath) - lobectomy and mediastinal LND   - Path showed invasive mucinous adenocarcinoma (~5 cm) with a SM+ at the stapled vascular margin. 0/12 LN's.  - completed adjuvant cis/pem and XRT in 1/18  - 7/18 local regional recurrence s/p resection 06/06/17, PD-L1 0%. Path showed invasive mucinous adenocarcinoma 1.6cm with negative margins. STRATA with ERBB2 mutation.   - recommended active surveillance  - 12/23/17 PET concerning for recurrence  - 01/06/18 FNA showed atypical epithelial cells  - 02/06/18 L pneumonectomy 5.6cm invasive mucinous adenocarcinoma ([id number])  -10/27/18 started neratinib for PD in multiple right lung nodules, PD 07/2020  - 07/12/20 started Enhertu, completed 2C  - 08/23/20, PD on scans. Plan to initiate next line therapy with carbo/pem/pembro  - 08/30/20 C1 carbo/pem/pembro  - 09/20/20 C2 carbo/pem/pembro  - 10/11/20 C3 carbo/pem/pembro   - 11/01/20 - 1 wk delay for neutropenia   - 11/08/20 - c4 carbo/pem/pembro   - 11/29/20 - scans with SD; maintenance pem/pembro   - 01/31/21 - pem dropped for anemia/fatigue, maintenance pembro   - 04/10/21 - PD on scans.  - 04/11/21 - C1 carbo/abraxane     Interim History:   Feeling well, noticed about day 14 that she had profound fatigue and would need to sit down after climbing the stairs, thinks it may be related to her hemoglobin. This lasted about three days before she improved. Otherwise no complaints, glad her scans are improved. Planning a family trip to [place] to see her birth siblings. Breathing stable, no pain.    ROS:  10 systems were reviewed and found to be within normal limits except as described in the HPI.    PMH:  Past Medical History:   Diagnosis Date   ??? Allergic rhinitis    ??? Cancer (CMS-HCC)     Lung   ??? CTS (carpal tunnel syndrome)    ??? Depression    ??? Glaucoma     left eye   ??? Hypertension    ??? Hypothyroidism    ??? Osteoarthritis      PSH:  Past Surgical History:   Procedure Laterality Date   ??? APPENDECTOMY N/A 1963    ruptured w. complications   ??? CARPAL TUNNEL RELEASE Right 03/01/14   ???  EXPLORATORY LAPAROTOMY N/A 1989   ??? IR INSERT PORT AGE GREATER THAN 5 YRS  01/03/2021    IR INSERT PORT AGE GREATER THAN 5 YRS 01/03/2021 Gwenlyn Fudge, MD IMG VIR [facility]   ??? NASAL SINUS SURGERY N/A 2013   ??? PR BRNCHSC EBUS GUIDED SAMPL 1/2 NODE STATION/STRUX N/A 01/06/2018    Procedure: Bronch, Rigid Or Flexible, Inc Fluoro Guidance, When Performed; With Ebus Guided Transtracheal And/Or Transbronchial Sampling, One Or Two Mediastinal And/Or Hilar Lymph Node Stations Or Structures;  Surgeon: Mercy Moore, MD;  Location: MAIN OR [facility];  Service: Pulmonary   ??? PR BRONCHOSCOPY W/THER ASPIR TRACHBRNCL TREE 1ST N/A 06/06/2017    Procedure: Bronchoscpy, Rigid Or Flexible, W/Fluoro; With Therapeutic Aspiration Of Tracheobronchial Tree, Initial;  Surgeon: Cherie Dark, MD;  Location: MAIN OR [facility]; Service: Thoracic   ??? PR BRONCHOSCOPY W/THER ASPIR TRACHBRNCL TREE 1ST N/A 02/06/2018    Procedure: Bronchoscpy, Rigid Or Flexible, W/Fluoro; With Therapeutic Aspiration Of Tracheobronchial Tree, Initial;  Surgeon: Cherie Dark, MD;  Location: MAIN OR [facility];  Service: Thoracic   ??? PR BRONCHOSCOPY,COMPUTER ASSIST/IMAGE-GUIDED NAVIGATION N/A 08/31/2015    Procedure: Bronchoscopy, Flexible, Include Fluoro When Performed; W/Computer-Assist, Image-Guided Navigation;  Surgeon: Jerelyn Charles, MD;  Location: MAIN OR [facility];  Service: Pulmonary   ??? PR BRONCHOSCOPY,DIAGNOSTIC W LAVAGE Left 08/31/2015    Procedure: Bronchoscopy, Flexible, Include Fluoroscopic Guidance When Performed; W/Bronchial Alveolar Lavage;  Surgeon: Jerelyn Charles, MD;  Location: MAIN OR [facility];  Service: Pulmonary   ??? PR BRONCHOSCOPY,TRANSBRONCH BIOPSY Left 08/31/2015    Procedure: Bronchoscopy, Flexible, Include Fluoro Guidance When Performed; W/Transbronchial Lung Bx, Single Lobe;  Surgeon: Jerelyn Charles, MD;  Location: MAIN OR [facility];  Service: Pulmonary   ??? PR DECORTICATION,PULMONARY,TOTAL Left 02/06/2018    Procedure: Decortic Pulm (Separt Proc); Tot;  Surgeon: Cherie Dark, MD;  Location: MAIN OR [facility];  Service: Thoracic   ??? PR EXCIS INTERDIGITAL NEUROMA,EA Left 02/01/2015    Procedure: EXC INTERDIGITAL NEUROMA SNGL EA;  Surgeon: Christena Deem, MD;  Location: MAIN OR [facility];  Service: Orthopedics   ??? PR INJECTION AA&/STRD INTERCOSTAL NRV EA ADDL LVL Left 06/06/2017    Procedure: Intercostal Nerve Block-Multi Levels;  Surgeon: Cherie Dark, MD;  Location: [facility]IN OR [facility];  Service: Thoracic   ??? PR LARYNGOPLASTY MEDIALIZATION UNLIATERAL Bilateral 04/30/2018    Procedure: LARYNGOPLASTY, MEDIALIZATION, UNILATERAL;  Surgeon: Hardie Pulley, MD;  Location: MAIN OR [facility];  Service: ENT   ??? PR MUSCLE-SKIN FLAP,TRUNK Left 02/06/2018    Procedure: Muscle, Myocutaneous, Or Fasciocutaneous Flap; Trunk;  Surgeon: Cherie Dark, MD;  Location: MAIN OR [facility];  Service: Thoracic   ??? PR REMOVAL OF LUNG Left 02/06/2018    Procedure: REMOVAL OF LUNG, PNEUMONECTOMY;  Surgeon: Cherie Dark, MD;  Location: MAIN OR [facility];  Service: Thoracic   ??? PR THORACOSCOPY W/DX WEDGE RESEXN ANATO LUNG RESEXN Left 04/04/2016    Procedure: ROBOTIC XI THORACOSCOPY, SURGICAL; WITH DIAGNOSTIC WEDGE RESECTION FOLLOWED BY ANATOMIC LUNG RESECTION;  Surgeon: Cherie Dark, MD;  Location: MAIN OR [facility];  Service: Cardiothoracic   ??? PR THORACOTOMY W/THERAPEUTIC WEDGE RESEXN INITIAL Left 06/06/2017    Procedure: Thoracotomy; With Therapeutic Wedge Resection (Eg, Mass, Nodule), Initial;  Surgeon: Cherie Dark, MD;  Location: MAIN OR [facility];  Service: Thoracic   ??? PR THORACOTOMY,LYSE ADHESIONS Left 06/06/2017    Procedure: THORACOTOMY; WITH OPEN INTRAPLEURAL PNEUMONOLYSIS;  Surgeon: Cherie Dark, MD;  Location: MAIN OR [facility];  Service: Thoracic   ???  PR WRIST ARTHROSCOP,RELEASE XVERS LIG Right 02/26/2014    Procedure: ENDOSCOPY WRIST SURG; [employer] LIG[employer]T;  Surgeon: Lorelee Cover, MD;  Location: ASC OR [facility];  Service: Orthopedics   ??? TONSILLECTOMY Bilateral 1950's   ??? TUBAL LIGATION Bilateral 1991       MEDICATIONS:  has a current medication list which includes the following prescription(s): acetaminophen, azelastine, bupropion, clonazepam, cyanocobalamin, ginseng, hydrocortisone, lactobacillus 3/fos/pantethine, lapatinib, levothyroxine, loratadine, metoprolol succinate, multivitamin, olanzapine, ondansetron, and prochlorperazine.    ALLERGIES:   Allergies   Allergen Reactions   ??? Amlodipine      edema   ??? Codeine Nausea And Vomiting     Nausea and vomiting       SOCIAL HISTORY:  Social History     Social History Narrative    Quit smoking in 1980's. Previously smoked 1 ppd x 12 years.       reports that she quit smoking about 39 years ago. Her smoking use included cigarettes. She has a 12.00 pack-year smoking history. She has never used smokeless tobacco.    FAMILY HISTORY:  Cancer-related family history includes Prostate cancer in her father. There is no history of Cancer or Melanoma.    PHYSICAL EXAMINATION:    VS: .  Vitals:    05/30/21 0814   BP: 116/66   Pulse: 84   Resp: 16   Temp: 35.9 ??C (96.6 ??F)   SpO2: 100%     ECOG 1    Constitutional: well developed female in NAD; alopecia   Eyes: PERRL, sclera anicteric, conjunctiva clear  Respiratory:  Normal WOB  Musculoskeletal: Upper and lower extremities are atraumatic in appearance without tenderness or deformity. No swelling or erythema.  Skin: clean, dry and intact, no rashes or lesions  Neurologic: alert and oriented, strength and sensation grossly intact bilaterally  Psychiatric: affect varied and appropriate        PATHOLOGY:  Addendum   - PD-L1 (16X0) IHC has been requested, results to be reported in an addendum.  ??   Addendum electronically signed by Lorayne Bender, MD on 05/14/2016 at 1004   Final Diagnosis   A:  Lung, left upper lobe, wedge resection  - Invasive mucinous adenocarcinoma, see synoptic report for additional information.  ??  B:  Lung, left upper lobe, completion lobectomy   - Invasive mucinous adenocarcinoma, see synoptic report for additional information.  - No tumor seen in 7 lymph nodes (0/7).  ??  C:  Lymph node, level 7, resection  - No tumor seen in multiple lymph node fragments (0/1).  ??  D:  Lymph node, level 5L, resection  - No tumor seen in multiple lymph node fragments (0/1).  ??  E:  Lymph node, level 6L, resection  - No tumor seen in multiple lymph node fragments (0/1).  ??  F:  Lymph node, level 10L, resection  - No tumor seen in multiple lymph node fragments (0/1).  ??  G:  Lymph node, level 11, resection  - No tumor seen in multiple lymph node fragments (0/1).  ??   Electronically signed by Lorayne Bender, MD on 04/10/2016 at 1706   Synoptic Report   LUNG: Resection   Lung - All Specimens   SPECIMEN   Specimen  Lobe(s) of lung   Lobes of Lung  Upper lobe   Procedure Other (specify): Wedge resection and completion lobectomy   Specimen Laterality  Left   TUMOR   Primary Tumor Site  Upper lobe   Histologic  Type  Invasive mucinous adenocarcinoma   Histologic Grade  Not applicable   Tumor Size  Cannot be determined: Transected tumor in specimens A and B (See Comment)   Tumor Focality  Unifocal   Visceral Pleura Invasion  Not identified   Tumor Extension  Not identified   Lymph-Vascular Invasion  Not identified   MARGINS   Bronchial Margin  Uninvolved by invasive carcinoma and carcinoma in situ   Vascular Margin  Involved by invasive carcinoma   Parenchymal Margin  Not applicable: (See Comment)   LYMPH NODES   Regional Lymph Nodes     Number of Lymph Nodes Examined  At least: 12   Lymph Node Involvement  No nodes involved   STAGE (pTNM)   Primary Tumor (pT)  pT2a: Tumor greater than 3 cm, but 5 cm or less in greatest dimension surrounded by lung or visceral pleura without bronchoscopic evidence of invasion more proximal than the lobar bronchus (i.e., not in the main bronchus); or Tumor 5 cm or less in greatest dimension with any of the following features of extent: involves main bronchus, 2 cm or more distal to the carina; invades the visceral pleura; associated with atelectasis or obstructive pneumonitis that extends to the hilar region but does nt involve the entire lung   Regional Lymph Nodes (pN)  pN0: No regional lymph node metastasis   .      Comment        The tumor is present within specimens A and B, and appears to have been transected in the initial wedge resection, and contiguous with tumor identified in the completion lobectomy.  Because the tumor is present within 2 specimens, the maximum tumor size cannot be determined with certainty; however, the tumor is estimated to be at least pT2a, and likely approaches 5 cm in maximum dimension.  Tumor abuts the staple lines of specimens A and B; however, these staple lines are likely in continuity representing the initial wedge resection, and these likely do not represent a true parenchymal margin.  Frozen section FSB2, the en face staple line at stitch, may also represent tumor at the wedge resection line, and FSB2 does show extensive involvement by tumor in the en face section.  However, this orientation is not clear, and correlation with intraoperative findings is recommended.  ??  In the en face vascular margin of the completion lobectomy (slide B3), there is invasive carcinoma focally present in the perivascular soft tissue adjacent to the vessel, and involvement of the adjacent true stapled margin cannot be excluded. This tissue would be immediately subjacent to the stapled vascular margin, with tumor extending to within at least 2 mm of the true margin, cannot exclude focal involvement by invasive carcinoma.  ??  The diagnosis was called to Dr. Shela Commons. Long on 04/10/16 at 4:00pm by Dr. Katrinka Blazing, and the case was discussed.  ??  The case was also reviewed by Dr. Bo Mcclintock and he concurs with the diagnosis.  ??  The frozen section diagnoses are confirmed.  ??     06/06/17 Wedge resection  Final Diagnosis   A: Lung, left lower lobe, wedge resection   - Ill defined nodule of invasive mucinous adenocarcinoma with lepidic spread and spread through air spaces, spanning 1.6 cm in this wedge biopsy, tumor abutting but not definitively involving pleura, clinically recurrent/metastatic lung adenocarcinoma  - Parenchymal margin free of tumor, tumor is 12 mm from the parenchymal margin   - Two intrapulmonary lymph nodes with no tumor seen (  0/2)   ??  B: Lung, left lower lobe, wedge #2  - Three portions of lung parenchyma and pleura with no tumor seen, mild fibrous pleural adhesions present on pleural surface of these specimens     5/19 Pneumonectomy  Final Diagnosis   A: Lung, left lower lobe, lobectomy  - Invasive mucinous adenocarcinoma, 5.5 cm in greatest dimension, clinically recurrent  - Tumor spread through air spaces is identified  - Surgical margins free of tumor  ??  B: Lymph node, 9L, biopsy  - No tumor seen in one lymph node (0/1)  ??     LABS  Lab Results   Component Value Date    WBC 5.4 05/30/2021    HGB 8.9 (L) 05/30/2021    HCT 24.9 (L) 05/30/2021    PLT 158 05/30/2021       Lab Results   Component Value Date    NA 138 05/30/2021    K 3.8 05/30/2021    CL 109 (H) 05/30/2021    CO2 25.0 05/30/2021    BUN 17 05/30/2021    CREATININE 1.16 (H) 05/30/2021    GLU 85 05/30/2021    CALCIUM 9.0 05/30/2021    MG 1.6 06/02/2020    PHOS 3.5 08/23/2020       Lab Results   Component Value Date    BILITOT 0.3 05/30/2021    BILIDIR <0.10 01/20/2019    PROT 6.4 05/30/2021    ALBUMIN 3.6 05/30/2021    ALT 10 05/30/2021    AST 18 05/30/2021    ALKPHOS 97 05/30/2021       Lab Results   Component Value Date    PT 12.9 (H) 02/07/2018    INR 1.13 02/07/2018    APTT 29.5 02/07/2018           RADIOLOGY  CT Chest 05/29/21-   Stable sequelae of left pneumonectomy, with interval decrease in size of right lung nodules. No new or suspicious pulmonary nodules.  ??  For findings below the diaphragm, please see concurrent separately reported CT of the abdomen and pelvis.    CT Chest 05/29/21-   -- No metastatic disease in the abdomen or pelvis.  --Similar-appearing rounded calcific density within the gallbladder neck favoring a gallstone with upstream moderate hydropic gallbladder. Common bile duct remains mildly dilated measuring 1.0 cm, similar compared to prior    STRATA  ERBB2 p.V842I  CTNNB1 p.S37F     PD L-1  TPS 0%    ASSESSMENT AND PLAN: [name] is a 70yoF with history of Stage IB (pT2a pN0 pMx) invasive mucinous adenocarcinoma s/p multiple surgeries, then with progression to Stage IV disease s/p neratinib  through 07/12/20, subsequently transitioned to Enhertu 2/2 PD; post 4C carbo/pem/pembro  s/p 7C maintenance pem/pembro and 10C of pembrolizumab. Patient with PD on July 2022 scans and started on carbo/abraxane; now s/p 2 cycles with restaging scans today showing disease response.     NSCLC: At time of PD, elected to move on to carboplatin + abraxane per the phase 3 IFCT-0501 trial comparing paclitaxel and carboplatin combination therapy with single-agent vinorelbine or gemcitabine in patients ?70?years. PFS and overall survival were significantly improved with combination treatment [median OS 10.3 vs 6.2?months; median PFS 6.0 vs 2.8?months]. Tolerating well.   -- She is now s/p 2C carbo/abraxane with cancellation of C1D15 due to neutropenia.   -- Scans today showing disease response to therapy   -- continue with 21 day cycle, carboplatin day 1, nab-paclitaxel days 1,  8, gcsf support day 8 for two more cycles with plan for ongoing abraxane therapy following.   -- RTC three weeks with tox check, consideration of further therapy     Anola Gurney, AGNP

## 2021-05-30 NOTE — Unmapped (Signed)
Patient in clinic for treatment.  No new concerns or complaints.  Chemo clarification order obtained to treat with increased creatinine.  Patient tolerated all treatment without incident.

## 2021-05-30 NOTE — Unmapped (Signed)
Port accessed and labs drawn with no complications by Tinita R.   Port flushed with saline.

## 2021-05-30 NOTE — Unmapped (Signed)
Lab on 05/30/2021   Component Date Value Ref Range Status    Sodium 05/30/2021 138  135 - 145 mmol/L Final    Potassium 05/30/2021 3.8  3.4 - 4.8 mmol/L Final    Chloride 05/30/2021 109 (A) 98 - 107 mmol/L Final    CO2 05/30/2021 25.0  20.0 - 31.0 mmol/L Final    Anion Gap 05/30/2021 4 (A) 5 - 14 mmol/L Final    BUN 05/30/2021 17  9 - 23 mg/dL Final    Creatinine 16/07/9603 1.16 (A) 0.60 - 0.80 mg/dL Final    BUN/Creatinine Ratio 05/30/2021 15   Final    eGFR CKD-EPI (2021) Female 05/30/2021 50 (A) >=60 mL/min/1.66m2 Final    eGFR calculated with CKD-EPI 2021 equation in accordance with [employer] and [employer] of Nephrology Task Force recommendations.    Glucose 05/30/2021 85  70 - 179 mg/dL Final    Calcium 54/06/8118 9.0  8.7 - 10.4 mg/dL Final    Albumin 14/78/2956 3.6  3.4 - 5.0 g/dL Final    Total Protein 05/30/2021 6.4  5.7 - 8.2 g/dL Final    Total Bilirubin 05/30/2021 0.3  0.3 - 1.2 mg/dL Final    AST 21/30/8657 18  <=34 U/L Final    ALT 05/30/2021 10  10 - 49 U/L Final    Alkaline Phosphatase 05/30/2021 97  46 - 116 U/L Final    WBC 05/30/2021 5.4  3.6 - 11.2 10*9/L Final    RBC 05/30/2021 2.76 (A) 3.95 - 5.13 10*12/L Final    HGB 05/30/2021 8.9 (A) 11.3 - 14.9 g/dL Final    HCT 84/69/6295 24.9 (A) 34.0 - 44.0 % Final    MCV 05/30/2021 89.9  77.6 - 95.7 fL Final    MCH 05/30/2021 32.0  25.9 - 32.4 pg Final    MCHC 05/30/2021 35.7  32.0 - 36.0 g/dL Final    RDW 28/41/3244 16.5 (A) 12.2 - 15.2 % Final    MPV 05/30/2021 7.0  6.8 - 10.7 fL Final    Platelet 05/30/2021 158  150 - 450 10*9/L Final    Neutrophils % 05/30/2021 81.3  % Final    Lymphocytes % 05/30/2021 5.3  % Final    Monocytes % 05/30/2021 12.0  % Final    Eosinophils % 05/30/2021 0.4  % Final    Basophils % 05/30/2021 1.0  % Final    Absolute Neutrophils 05/30/2021 4.4  1.8 - 7.8 10*9/L Final    Absolute Lymphocytes 05/30/2021 0.3 (A) 1.1 - 3.6 10*9/L Final    Absolute Monocytes 05/30/2021 0.6  0.3 - 0.8 10*9/L Final Absolute Eosinophils 05/30/2021 0.0  0.0 - 0.5 10*9/L Final    Absolute Basophils 05/30/2021 0.1  0.0 - 0.1 10*9/L Final    Anisocytosis 05/30/2021 Slight (A) Not Present Final

## 2021-05-30 NOTE — Unmapped (Signed)
Notified infusion APP of crcl out of parameter for treatment.

## 2021-05-30 NOTE — Assessment & Plan Note (Signed)
Stable, managed by [facility].  PTH and phosphorus were checked in November   Lab Results  Component Value Date   CREATININE 1.34 (H) 08/16/2020   CREATININE 1.34 (H) 08/16/2020   Lab Results  Component Value Date   PTH 52 06/01/2020   CALCIUM 9.7 08/16/2020   CALCIUM 9.7 08/16/2020   CAION 5.45 06/18/2018   PHOS 4.8 (H) 06/01/2020

## 2021-05-30 NOTE — Assessment & Plan Note (Signed)
Managed with wellbutrin and prn clonazepam.  No changes today ; symptoms are controlled

## 2021-05-30 NOTE — Assessment & Plan Note (Signed)
Refilling topical zovirax per patient request for future prn use

## 2021-05-30 NOTE — Assessment & Plan Note (Signed)
Taking 75 mg toprol xl and home readings are < 140/80. Recommend treating prn  readings at home that are 013 systolic or higher  With an extra 25 mg metoprolol,  Or 2.5 mg amlodipine if pulse is  < 65

## 2021-05-30 NOTE — Assessment & Plan Note (Signed)
She had progression noted this spring, multi drug chemo regimen was initiated with good response and only mild intolerance.  Infusions were reduced due to neutropenia

## 2021-06-02 MED ORDER — ACYCLOVIR 5 % EX OINT: 1.0000 "application " | TOPICAL_OINTMENT | CUTANEOUS | 1 refills | Status: DC

## 2021-06-06 ENCOUNTER — Other Ambulatory Visit: Admit: 2021-06-06 | Discharge: 2021-06-07 | Payer: MEDICARE

## 2021-06-06 ENCOUNTER — Ambulatory Visit: Admit: 2021-06-06 | Discharge: 2021-06-07 | Payer: MEDICARE

## 2021-06-06 ENCOUNTER — Ambulatory Visit
Admit: 2021-06-06 | Discharge: 2021-06-07 | Payer: MEDICARE | Attending: Hematology & Oncology | Primary: Hematology & Oncology

## 2021-06-06 DIAGNOSIS — C3492 Malignant neoplasm of unspecified part of left bronchus or lung: Principal | ICD-10-CM

## 2021-06-06 DIAGNOSIS — Z1159 Encounter for screening for other viral diseases: Principal | ICD-10-CM

## 2021-06-06 DIAGNOSIS — Z515 Encounter for palliative care: Principal | ICD-10-CM

## 2021-06-06 DIAGNOSIS — R530 Neoplastic (malignant) related fatigue: Principal | ICD-10-CM

## 2021-06-06 DIAGNOSIS — R9389 Abnormal findings on diagnostic imaging of other specified body structures: Principal | ICD-10-CM

## 2021-06-06 LAB — CBC W/ AUTO DIFF
BASOPHILS ABSOLUTE COUNT: 0 10*9/L (ref 0.0–0.1)
BASOPHILS RELATIVE PERCENT: 0.8 %
EOSINOPHILS ABSOLUTE COUNT: 0 10*9/L (ref 0.0–0.5)
EOSINOPHILS RELATIVE PERCENT: 0.9 %
HEMATOCRIT: 24.8 % — ABNORMAL LOW (ref 34.0–44.0)
HEMOGLOBIN: 8.7 g/dL — ABNORMAL LOW (ref 11.3–14.9)
LYMPHOCYTES ABSOLUTE COUNT: 0.3 10*9/L — ABNORMAL LOW (ref 1.1–3.6)
LYMPHOCYTES RELATIVE PERCENT: 9.5 %
MEAN CORPUSCULAR HEMOGLOBIN CONC: 35.2 g/dL (ref 32.0–36.0)
MEAN CORPUSCULAR HEMOGLOBIN: 31.4 pg (ref 25.9–32.4)
MEAN CORPUSCULAR VOLUME: 89.2 fL (ref 77.6–95.7)
MEAN PLATELET VOLUME: 7.4 fL (ref 6.8–10.7)
MONOCYTES ABSOLUTE COUNT: 0.2 10*9/L — ABNORMAL LOW (ref 0.3–0.8)
MONOCYTES RELATIVE PERCENT: 4.9 %
NEUTROPHILS ABSOLUTE COUNT: 2.9 10*9/L (ref 1.8–7.8)
NEUTROPHILS RELATIVE PERCENT: 83.9 %
PLATELET COUNT: 217 10*9/L (ref 150–450)
RED BLOOD CELL COUNT: 2.79 10*12/L — ABNORMAL LOW (ref 3.95–5.13)
RED CELL DISTRIBUTION WIDTH: 17.4 % — ABNORMAL HIGH (ref 12.2–15.2)
WBC ADJUSTED: 3.4 10*9/L — ABNORMAL LOW (ref 3.6–11.2)

## 2021-06-06 MED ORDER — METHYLPHENIDATE 5 MG TABLET
ORAL_TABLET | TRANSDERMAL | 0 refills | 0.00000 days | Status: CP
Start: 2021-06-06 — End: ?

## 2021-06-06 MED ADMIN — ondansetron (ZOFRAN) tablet 16 mg: 16 mg | ORAL | @ 15:00:00 | Stop: 2021-06-06

## 2021-06-06 MED ADMIN — heparin, porcine (PF) 100 unit/mL injection 500 Units: 500 [IU] | INTRAVENOUS | @ 17:00:00 | Stop: 2021-06-07

## 2021-06-06 MED ADMIN — sodium chloride (NS) 0.9 % infusion: 100 mL/h | INTRAVENOUS | @ 15:00:00

## 2021-06-06 MED ADMIN — PACLitaxeL protein-bound (ABRAXANE) IVPB 166 mg: 100 mg/m2 | INTRAVENOUS | @ 17:00:00 | Stop: 2021-06-06

## 2021-06-06 NOTE — Unmapped (Signed)
Specialty Medication(s): Tykerb and Ziextenzo    Ms.Krista Pratt has been dis-enrolled from the [facility] specialty pharmacy services due to enrollment in a manufacturer assistance program that sends medicine directly to the patient.    Additional information provided to the patient:     Medication (Brand/Generic): ZIEXTENZO 6MG /0.6ML INJECTION  Patient Assistance Program: [employer] PAP  Patient ID: [id number]  Coverage Dates: 05/04/21 through 09/30/21    AND    Medication (Brand/Generic): TYKERB 250MG  TABLET  Patient Assistance Program: [employer]  Patient ID: [id number]  Coverage Dates: 06/02/21 through 09/30/21    Manufacturer's contracted specialty pharmacy: [facility] will ship med directly to patient  Patient may contact manufacturer if needed via phone at [phone]      Kermit Balo  [facility] Specialty Pharmacist

## 2021-06-06 NOTE — Unmapped (Unsigned)
OUTPATIENT ONCOLOGY PALLIATIVE CARE  REACH Palliative Care Study Note    Assessment: Krista Krista is a 70 y.o. female with NSCLC, adenocarcinoma, now likely stage IV.     Plan:  1. Symptom Issues:   Fatigue-improved  -monitor    Anorexia-is ok with current appetite. Enjoying food a bit. Just not eating as much with some wgt loss-is ok with current wgt.  -monitor    Mood support-fair-looking forward to upcoming July vacation  -Active with [facility]  -Continue wellbutrin      2.  Patient???s Illness Understanding/Prognostic Awareness:   Not addressed today  At prior visits: Remains insightful into her dx process.  Received news today of cancer progression and plan to change treatment.       3.  Hopes and Worries:   -Going to [employer] on 7/30. Shared that the cruise line has been through with covid precautions. Looking forward to this trip, but does have some concern. Doesn't want to live in fear.   At prior visits:Hoping to maintain her independence.    4. Patient Coping Assessment and Needs:  Appears to be coping well. Uses humor to help with coping.      5. Caregiver Coping Assessment and Needs:   Her spouse Krista Krista appears grounded and supportive.      6.  Prognostic Information Discussed:   Shared that July 5th will be having the 5 year time of surgery.   At prior visits: yes    7.  End-of-Life Preferences/Planning:  --Healthcare Proxy: Spouse: Krista Krista  --Code Status:  full  --Healthcare power of attorney and living will was scanned into Epic 01/05/2020      Health Care Decision Maker- primary agent is her spouse, Krista Krista and if he is not available, Krista Krista      8.  Controlled substances risk management.   - Patient does not have a signed pain medication agreement with our team.        F/u: per protocol    Referring Provider: Dr. Janann Krista  Oncology Team: Dr. Janann Krista  PCP: Krista Shams, MD      HPI: Presented with symptoms of cough and cold 05/2015, work-up showed inflammation on the bronchoscopy.  Symptoms continued and dx with mid year 2017 with NSCLC that arises from the mucus and had tx with with sx, chemo and xrt. 12/2017 with a recurrence in the lung fields. In May 2019 with another lung sx of a complete left pneumonectomy. 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 July 2019 worked with ENT and has a silcone prosthesis to help with vocalization and this has helped. No difficultly with swallowing. Feels that she has devoted much energy to having had surgery and knowing that the cancer is in her right lung has been difficult. She was on the wellbutrin, but stopped this and started the celexa with her PCP and this has helped.  She has shared that she had started working with Krista Krista in January of this year and has found her to be very helpful.     Found visit with Krista Krista helpful. Goal is to keep the creatinine is the 1.2 range.     Current cancer-directed therapy: pembro       Interval hx 03/21/21 CK, Krista Krista and Krista Comings NP  -Did have a 36 hour window of ruq abd , but resolved over the w/e.   -Bms ever 2-3 days. Prn use of colace.  -Energy is better.  -  Breathing is good. Has some congestion, but thinks this is from sinus issues. Some heaviness in sinus. Improves after claritin.  -Enjoyed recent [facility] vacation         Palliative Performance Scale: 70% - Ambulation: Reduced / unable to do normal work, some evidence of disease / Self-Care: Full / Intake: Normal or reduced / Level of Conscious: Full    Coping:   At prior visits: Pt patient describes herself as not being religious nor spiritual.  She states that she is not afraid of death.  She shared that she is trying to be mellow about this process and she is okay using medications to help her feel this way.  She states that she wants to be at home when she dies and that death doesn't scare her.    Goals of Care: To stay as active and engaged as long as she can.  No desire to prolong her life if she is not able to be alert and engaged.    Social History:   Name of primary support: Her spouse, Krista Krista, they have been married since 1984.  It is a second marriage for the both of them.  She has a support of her sons.  They have 2 sons together, the oldest one lives in [place] and the other son lives in [place], his name is Krista Krista and he is an Dentist.  Her third son lives in [place] and this is from her first marriage.  She also reports support from her sister in [place] and a brother and sister in [place].  She also has the support of her girlfriends.    Oldest son lives in [place]. Middle son lives locally and works in the hospital.  Occupation: She is a retired Adult nurse  Hobbies: sewing  Current residence / distance from [employer]: Lives in [place].  40 minutes away.    Advance Care Planning:   HCPOA: Her primary agent is her spouse, Krista Krista and if he is not available, Krista Krista is her second surrogate.  Natural surrogate decision maker:  Living Will: Yes, patient states that she will bring this in when she is to follow-up in the hospital.  ACP note:       Opioid Risk Tool: not completed today  ?? Female  Female    Family history of substance abuse      Alcohol  1  3    Illegal drugs  2  3    Rx drugs  4  4    Personal history of substance abuse      Alcohol  3  3    Illegal drugs  4  4    Rx drugs  5  5    Age between 16--45 years  1  1    History of preadolescent sexual abuse  3  0    Psychological disease      ADD, OCD, bipolar, schizophrenia  2  2    Depression  1  1    ??  Total: na  (<3 low risk, 4-7 moderate risk, >8 high risk)      Oncology History   Adenocarcinoma of lung, stage 4, left (CMS-HCC)   04/11/2016 Initial Diagnosis    Adenocarcinoma, lung (CMS-HCC)     02/24/2018 -  Cancer Staged    Staging form: Lung, AJCC 7th Edition  - Pathologic stage from 02/24/2018: Stage IIB (rT3, N0, cM0) - Signed by Cherie Dark, MD on  02/24/2018       07/12/2020 - 08/02/2020 Chemotherapy OP LUNG NSCLC FAM-TRASTUZUMAB DERUXTECAN-NXKI  fam-trastuzumab deruxtecan-nxki 6.4 mg/kg IV on day 1, every 21 days     08/30/2020 - 03/21/2021 Chemotherapy    OP LUNG CARBOPLATIN/PEMETREXED/PEMBROLIZUMAB  pembrolizumab 200 mg IV on day 1, pemetrexed 500 mg/m2 IV on day 1, CARBOplatin AUC 5 IV for the first 4 cycles, every 21 days     04/11/2021 - 04/11/2021 Chemotherapy    OP PEMBROLIZUMAB 400 MG Q6W  pembrolizumab 400 mg IV on day 1, every 42 days     04/11/2021 -  Chemotherapy    OP LUNG ABRAXANE/CARBOPLATIN  CARBOplatin IV AUC6 on day 1, PACLItaxel-protein bound IV 100 mg/m2 on days 1, 8, 15, every 21 days         Patient Active Problem List   Diagnosis   ??? Allergic state   ??? Dysthymic disorder   ??? Osteoarthritis of both knees   ??? Hypertension (RAF-HCC)   ??? Hypothyroidism   ??? Abnormal chest CT   ??? Mass of upper lobe of left lung   ??? Anxiety and depression   ??? Adenocarcinoma of lung, stage 4, left (CMS-HCC)   ??? Dyspnea   ??? Medicare annual wellness visit, initial   ??? Screening for breast cancer   ??? On antineoplastic chemotherapy   ??? 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 Dysphonia   ??? Renal failure   ??? Allergy   ??? Tachycardia   ??? Encounter for Medicare annual wellness exam   ??? Status post L pneumonectomy   ??? Adenocarcinoma of lung, stage 1, left (CMS-HCC)   ??? Left carotid bruit   ??? Educated about COVID-19 virus infection   ??? MRSA colonization   ??? Abnormal findings on diagnostic imaging of other specified body structures    ??? Acute renal failure superimposed on stage 3 chronic kidney disease (CMS-HCC)   ??? Chemotherapy-induced nausea   ??? Choledocholithiasis   ??? CKD (chronic kidney disease) stage 3, GFR 30-59 ml/min (CMS-HCC)   ??? Drug-induced pruritus   ??? Fingernail abnormalities   ??? Weight loss of more than 10% body weight   ??? Encounter for screening for other viral diseases    ??? Drug-related alopecia       Past Medical History:   Diagnosis Date   ??? Allergic rhinitis    ??? Cancer (CMS-HCC)     Lung   ??? CTS (carpal tunnel syndrome)    ??? Depression    ??? Glaucoma     left eye   ??? Hypertension    ??? Hypothyroidism    ??? Osteoarthritis        Past Surgical History:   Procedure Laterality Date   ??? APPENDECTOMY N/A 1963    ruptured w. complications   ??? CARPAL TUNNEL RELEASE Right 03/01/14   ??? EXPLORATORY LAPAROTOMY N/A 1989   ??? IR INSERT PORT AGE GREATER THAN 5 YRS  01/03/2021    IR INSERT PORT AGE GREATER THAN 5 YRS 01/03/2021 Gwenlyn Fudge, MD IMG VIR [facility]   ??? NASAL SINUS SURGERY N/A 2013   ??? PR BRNCHSC EBUS GUIDED SAMPL 1/2 NODE STATION/STRUX N/A 01/06/2018    Procedure: Bronch, Rigid Or Flexible, Inc Fluoro Guidance, When Performed; With Ebus Guided Transtracheal And/Or Transbronchial Sampling, One Or Two Mediastinal And/Or Hilar Lymph Node Stations Or Structures;  Surgeon: Mercy Moore, MD;  Location: MAIN OR [facility];  Service: Pulmonary   ??? PR BRONCHOSCOPY W/THER ASPIR TRACHBRNCL TREE  1ST N/A 06/06/2017    Procedure: Bronchoscpy, Rigid Or Flexible, W/Fluoro; With Therapeutic Aspiration Of Tracheobronchial Tree, Initial;  Surgeon: Cherie Dark, MD;  Location: MAIN OR [facility];  Service: Thoracic   ??? PR BRONCHOSCOPY W/THER ASPIR TRACHBRNCL TREE 1ST N/A 02/06/2018    Procedure: Bronchoscpy, Rigid Or Flexible, W/Fluoro; With Therapeutic Aspiration Of Tracheobronchial Tree, Initial;  Surgeon: Cherie Dark, MD;  Location: MAIN OR [facility];  Service: Thoracic   ??? PR BRONCHOSCOPY,COMPUTER ASSIST/IMAGE-GUIDED NAVIGATION N/A 08/31/2015    Procedure: Bronchoscopy, Flexible, Include Fluoro When Performed; W/Computer-Assist, Image-Guided Navigation;  Surgeon: Jerelyn Charles, MD;  Location: MAIN OR [facility];  Service: Pulmonary   ??? PR BRONCHOSCOPY,DIAGNOSTIC W LAVAGE Left 08/31/2015    Procedure: Bronchoscopy, Flexible, Include Fluoroscopic Guidance When Performed; W/Bronchial Alveolar Lavage;  Surgeon: Jerelyn Charles, MD;  Location: MAIN OR [facility];  Service: Pulmonary   ??? PR BRONCHOSCOPY,TRANSBRONCH BIOPSY Left 08/31/2015    Procedure: Bronchoscopy, Flexible, Include Fluoro Guidance When Performed; W/Transbronchial Lung Bx, Single Lobe;  Surgeon: Jerelyn Charles, MD;  Location: MAIN OR [facility];  Service: Pulmonary   ??? PR DECORTICATION,PULMONARY,TOTAL Left 02/06/2018    Procedure: Decortic Pulm (Separt Proc); Tot;  Surgeon: Cherie Dark, MD;  Location: MAIN OR [facility];  Service: Thoracic   ??? PR EXCIS INTERDIGITAL NEUROMA,EA Left 02/01/2015    Procedure: EXC INTERDIGITAL NEUROMA SNGL EA;  Surgeon: Christena Deem, MD;  Location: MAIN OR [facility];  Service: Orthopedics   ??? PR INJECTION AA&/STRD INTERCOSTAL NRV EA ADDL LVL Left 06/06/2017    Procedure: Intercostal Nerve Block-Multi Levels;  Surgeon: Cherie Dark, MD;  Location: MAIN OR [facility];  Service: Thoracic   ??? PR LARYNGOPLASTY MEDIALIZATION UNLIATERAL Bilateral 04/30/2018    Procedure: LARYNGOPLASTY, MEDIALIZATION, UNILATERAL;  Surgeon: Hardie Pulley, MD;  Location: MAIN OR [facility];  Service: ENT   ??? PR MUSCLE-SKIN FLAP,TRUNK Left 02/06/2018    Procedure: Muscle, Myocutaneous, Or Fasciocutaneous Flap; Trunk;  Surgeon: Cherie Dark, MD;  Location: MAIN OR [facility];  Service: Thoracic   ??? PR REMOVAL OF LUNG Left 02/06/2018    Procedure: REMOVAL OF LUNG, PNEUMONECTOMY;  Surgeon: Cherie Dark, MD;  Location: MAIN OR [facility];  Service: Thoracic   ??? PR THORACOSCOPY W/DX WEDGE RESEXN ANATO LUNG RESEXN Left 04/04/2016    Procedure: ROBOTIC XI THORACOSCOPY, SURGICAL; WITH DIAGNOSTIC WEDGE RESECTION FOLLOWED BY ANATOMIC LUNG RESECTION;  Surgeon: Cherie Dark, MD;  Location: MAIN OR [facility];  Service: Cardiothoracic   ??? PR THORACOTOMY W/THERAPEUTIC WEDGE RESEXN INITIAL Left 06/06/2017    Procedure: Thoracotomy; With Therapeutic Wedge Resection (Eg, Mass, Nodule), Initial;  Surgeon: Cherie Dark, MD;  Location: MAIN OR [facility];  Service: Thoracic   ??? PR THORACOTOMY,LYSE ADHESIONS Left 06/06/2017    Procedure: THORACOTOMY; WITH OPEN INTRAPLEURAL PNEUMONOLYSIS;  Surgeon: Cherie Dark, MD; Location: MAIN OR [facility];  Service: Thoracic   ??? PR WRIST ARTHROSCOP,RELEASE XVERS LIG Right 02/26/2014    Procedure: ENDOSCOPY WRIST SURG; [employer] TRANSVER LIGAMT;  Surgeon: Lorelee Cover, MD;  Location: ASC OR [facility];  Service: Orthopedics   ??? TONSILLECTOMY Bilateral 1950's   ??? TUBAL LIGATION Bilateral 1991       Current Outpatient Medications   Medication Sig Dispense Refill   ??? acetaminophen (TYLENOL) 325 MG tablet Take 2 tablets (650 mg total) by mouth every four (4) hours as needed.  0   ??? azelastine (ASTELIN) 137 mcg nasal spray 1 spray by Each Nare route nightly as needed. Use in each nostril as directed      ???  buPROPion (WELLBUTRIN XL) 150 MG 24 hr tablet Take 150 mg by mouth daily.     ??? clonazePAM (KLONOPIN) 0.5 MG tablet Take 0.5 mg by mouth two (2) times a day as needed.      ??? cyanocobalamin 100 MCG tablet Take 100 mcg by mouth daily.     ??? GINSENG ORAL Take 1,000 mg by mouth two (2) times a day.     ??? hydrocortisone 2.5 % ointment Apply 1 application topically Two (2) times a day. 30 g 6   ??? LACTOBAC CMB #3/FOS/PANTETHINE (PROBIOTIC & ACIDOPHILUS ORAL) Take 1 packet by mouth daily at 0600.      ??? lapatinib (TYKERB) 250 mg tablet Take 6 tablets (1,500 mg total) by mouth in the morning. Take on an empty stomach (1 hour before or after a meal).. 180 tablet 11   ??? levothyroxine 75 mcg cap Take 75 mcg by mouth daily at 0600.      ??? loratadine (CLARITIN) 10 mg tablet Take 10 mg by mouth daily.     ??? metoprolol succinate (TOPROL-XL) 25 MG 24 hr tablet      ??? multivitamin (TAB-A-VITE/THERAGRAN) per tablet Take 1 tablet by mouth daily.      ??? OLANZapine (ZYPREXA) 5 MG tablet Take 1 tablet (5 mg total) by mouth nightly. Take on days 1-4 of each chemotherapy cycle (starting day of carboplatin infusion) 16 tablet 0   ??? ondansetron (ZOFRAN) 8 MG tablet Take 1 tablet (8 mg total) by mouth every twelve (12) hours as needed for nausea. 60 tablet 2   ??? prochlorperazine (COMPAZINE) 5 MG tablet Take 1 tablet (5 mg total) by mouth every six (6) hours as needed for nausea. 60 tablet 1     No current facility-administered medications for this visit.     Facility-Administered Medications Ordered in Other Visits   Medication Dose Route Frequency Provider Last Rate Last Admin   ??? heparin, porcine (PF) 100 unit/mL injection 500 Units  500 Units Intravenous Q30 Min PRN Molli Barrows, MD       ??? OKAY TO SEND MEDICATION/CHEMOTHERAPY TO UNIT   Other Once Chaely Valora Corporal, AGNP       ??? ondansetron (ZOFRAN) injection 8 mg  8 mg Intravenous Once PRN Chaely Valora Corporal, AGNP       ??? PACLitaxeL protein-bound (ABRAXANE) IVPB 166 mg  100 mg/m2 (Treatment Plan Recorded) Intravenous Once Chaely Valora Corporal, AGNP       ??? sodium chloride (NS) 0.9 % infusion  100 mL/hr Intravenous Continuous Chaely Valora Corporal, AGNP 100 mL/hr at 06/06/21 1106 100 mL/hr at 06/06/21 1106       Allergies:   Allergies   Allergen Reactions   ??? Amlodipine      edema   ??? Codeine Nausea And Vomiting     Nausea and vomiting       Family History:  Cancer-related family history includes Prostate cancer in her father. There is no history of Cancer or Melanoma.  She indicated that the status of her mother is unknown. She indicated that the status of her father is unknown. She indicated that the status of her sister is unknown. She indicated that the status of her brother is unknown. She indicated that the status of her maternal grandmother is unknown. She indicated that the status of her maternal grandfather is unknown. She indicated that the status of her paternal grandmother is unknown. She indicated that the status of her paternal grandfather is unknown. She  indicated that the status of her maternal aunt is unknown. She indicated that the status of her maternal uncle is unknown. She indicated that the status of her paternal aunt is unknown. She indicated that the status of her paternal uncle is unknown. She indicated that the status of her neg hx is unknown.      REVIEW OF SYSTEMS:  A comprehensive review of 10 systems was negative except for pertinent positives noted in HPI.           Lab Results   Component Value Date    CREATININE 1.16 (H) 05/30/2021     Lab Results   Component Value Date    ALKPHOS 97 05/30/2021    BILITOT 0.3 05/30/2021    BILIDIR <0.10 01/20/2019    PROT 6.4 05/30/2021    ALBUMIN 3.6 05/30/2021    ALT 10 05/30/2021    AST 18 05/30/2021      Vital signs for this encounter: VS reviewed in EPIC.  GEN: Awake and alert & in no acute distress  PSYCH: Alert and oriented to person, place and time. Euthymic.  HEENT: Pupils equally round without scleral icterus. No facial asymmetry.  ABD: Soft and non-tender with no distention  SKIN: No rashes, petechiae or jaundice noted on visible skin  EXT: No edema noted of the lower extremities  NEURO: sitting       Pam Drown, FNP-BC, [facility]  Outpatient Oncology Palliative Care Service  [facility]  [address] [place], [place] [address]  [phone]      {    Coding tips - Do not edit this text, it will delete upon signing of note!    ?? Telephone visits [phone] for Physicians and APP??s and [phone] for Non- Physician Clinicians)- Only use minutes on the phone to determine level of service.    ?? Video visits [phone]) - Use both minutes on video and pre/post minutes to determine level of service.       :[id number]}      {    Coding tips - Do not edit this text, it will delete upon signing of note!    ?? Telephone visits [phone] for Physicians and APP??s and [phone] for Non- Physician Clinicians)- Only use minutes on the phone to determine level of service.    ?? Video visits [phone]) - Use both minutes on video and pre/post minutes to determine level of service.       :[id number]}    Time spent with patient was 30 minutes.  Additional 5 minutes were spent on preparation, documenting and coordinating care.

## 2021-06-06 NOTE — Unmapped (Signed)
D8C3 Paclitaxel 166 mg completed without complications.   Line care provided with positive blood return prior to start and post infusion. Port flushed, heparinized per protocol, de-accessed.  Pt discharged from clinic in NAD, in stable condition, accompanied by family.

## 2021-06-07 DIAGNOSIS — R9389 Abnormal findings on diagnostic imaging of other specified body structures: Principal | ICD-10-CM

## 2021-06-07 DIAGNOSIS — Z1159 Encounter for screening for other viral diseases: Principal | ICD-10-CM

## 2021-06-07 DIAGNOSIS — C3492 Malignant neoplasm of unspecified part of left bronchus or lung: Principal | ICD-10-CM

## 2021-06-08 ENCOUNTER — Institutional Professional Consult (permissible substitution): Admit: 2021-06-08 | Discharge: 2021-06-09 | Payer: MEDICARE

## 2021-06-08 DIAGNOSIS — Z79899 Other long term (current) drug therapy: Principal | ICD-10-CM

## 2021-06-08 DIAGNOSIS — R9389 Abnormal findings on diagnostic imaging of other specified body structures: Principal | ICD-10-CM

## 2021-06-08 DIAGNOSIS — C3492 Malignant neoplasm of unspecified part of left bronchus or lung: Principal | ICD-10-CM

## 2021-06-08 DIAGNOSIS — Z1159 Encounter for screening for other viral diseases: Principal | ICD-10-CM

## 2021-06-08 DIAGNOSIS — R918 Other nonspecific abnormal finding of lung field: Principal | ICD-10-CM

## 2021-06-20 ENCOUNTER — Encounter: Admit: 2021-06-20 | Discharge: 2021-06-20 | Payer: MEDICARE | Attending: Adult Health | Primary: Adult Health

## 2021-06-20 ENCOUNTER — Other Ambulatory Visit: Admit: 2021-06-20 | Discharge: 2021-06-20 | Payer: MEDICARE

## 2021-06-20 ENCOUNTER — Ambulatory Visit: Admit: 2021-06-20 | Discharge: 2021-06-20 | Payer: MEDICARE

## 2021-06-20 ENCOUNTER — Ambulatory Visit: Admit: 2021-06-20 | Discharge: 2021-06-20 | Payer: MEDICARE | Attending: Adult Health | Primary: Adult Health

## 2021-06-20 DIAGNOSIS — D649 Anemia, unspecified: Principal | ICD-10-CM

## 2021-06-20 DIAGNOSIS — Z1159 Encounter for screening for other viral diseases: Principal | ICD-10-CM

## 2021-06-20 DIAGNOSIS — C3490 Malignant neoplasm of unspecified part of unspecified bronchus or lung: Principal | ICD-10-CM

## 2021-06-20 DIAGNOSIS — C3492 Malignant neoplasm of unspecified part of left bronchus or lung: Principal | ICD-10-CM

## 2021-06-20 DIAGNOSIS — R9389 Abnormal findings on diagnostic imaging of other specified body structures: Principal | ICD-10-CM

## 2021-06-27 ENCOUNTER — Other Ambulatory Visit: Admit: 2021-06-27 | Discharge: 2021-06-28 | Payer: MEDICARE

## 2021-06-27 ENCOUNTER — Ambulatory Visit: Admit: 2021-06-27 | Discharge: 2021-06-28 | Payer: MEDICARE

## 2021-06-27 DIAGNOSIS — R9389 Abnormal findings on diagnostic imaging of other specified body structures: Principal | ICD-10-CM

## 2021-06-27 DIAGNOSIS — Z1159 Encounter for screening for other viral diseases: Principal | ICD-10-CM

## 2021-06-27 DIAGNOSIS — C3492 Malignant neoplasm of unspecified part of left bronchus or lung: Principal | ICD-10-CM

## 2021-06-28 ENCOUNTER — Ambulatory Visit (INDEPENDENT_AMBULATORY_CARE_PROVIDER_SITE_OTHER): Payer: Medicare Other | Admitting: *Deleted

## 2021-06-28 DIAGNOSIS — C3492 Malignant neoplasm of unspecified part of left bronchus or lung: Secondary | ICD-10-CM

## 2021-06-28 DIAGNOSIS — I10 Essential (primary) hypertension: Secondary | ICD-10-CM

## 2021-06-28 NOTE — Patient Instructions (Signed)
Visit Information  PATIENT GOALS:  Goals Addressed             This Visit's Progress    (RNCM) Manage Fatigue (Tiredness- Cancer Treatment)   On track    Timeframe:  Long-Range Goal Priority:  Medium Start Date:   12/14/20                          Expected End Date:   09/29/21                    Follow Up Date 08/11/21    Eat healthy, take nausea medicine as prescribed Get a least 8 hours of sleep at night  Ask for help when needing it (don't be mad at yourself for needing assistance) Fall precautions and preventions, move slowly, sitting when feeling dizzy Allow yourself time to deal with your emotions Continue to work with Palliative Care Team when needed  Why is this important?   Cancer treatment and its side effects can drain your energy. It can keep you from doing things you would like to do.  There are many things that you can do to manage fatigue.    Notes:      (RNCM) Track and Manage My Blood Pressure-Hypertension   On track    Timeframe:  Long-Range Goal Priority:  Medium Start Date:   12/14/20                          Expected End Date:  11/28/21                     Follow Up Date 08/11/21    Check blood pressure 3 times per week Write blood pressure results in a log for provider review Notify provider for sustained elevations in blood pressure Eat more vegetables in you diet  Why is this important?   You won't feel high blood pressure, but it can still hurt your blood vessels.  High blood pressure can cause heart or kidney problems. It can also cause a stroke.  Making lifestyle changes like losing a little weight or eating less salt will help.  Checking your blood pressure at home and at different times of the day can help to control blood pressure.  If the doctor prescribes medicine remember to take it the way the doctor ordered.  Call the office if you cannot afford the medicine or if there are questions about it.     Notes:         Patient  verbalizes understanding of instructions provided today and agrees to view in [place].   The care management team will reach out to the patient again over the next 45 business days.   Hubert Azure RN, MSN RN Care Management Coordinator [place] [phone] [email or web address]

## 2021-06-28 NOTE — Chronic Care Management (AMB) (Signed)
Chronic Care Management   CCM RN Visit Note  06/28/2021 Name: Stacy Santana MRN: [id number] DOB: 09/02/1951  Subjective: Stacy Santana is a 70 y.o. year old female who is a primary care patient of Derrel Nip, Aris Everts, MD. The care management team was consulted for assistance with disease management and care coordination needs.    Engaged with patient by telephone for follow up visit in response to provider referral for case management and/or care coordination services.   Consent to Services:  The patient was given information about Chronic Care Management services, agreed to services, and gave verbal consent prior to initiation of services.  Please see initial visit note for detailed documentation.   Patient agreed to services and verbal consent obtained.   Assessment: Review of patient past medical history, allergies, medications, health status, including review of consultants reports, laboratory and other test data, was performed as part of comprehensive evaluation and provision of chronic care management services.   SDOH (Social Determinants of Health) assessments and interventions performed:    CCM Care Plan  Allergies  Allergen Reactions   Amlodipine     edema   Codeine     Nausea and vomiting    Outpatient Encounter Medications as of 06/28/2021  Medication Sig   acyclovir ointment (ZOVIRAX) 5 % Apply 1 application topically every 3 (three) hours.   azelastine (ASTELIN) 0.1 % nasal spray PLACE 1 SPRAY INTO BOTH NOSTRILS AT BEDTIME AS NEEDED   buPROPion (WELLBUTRIN XL) 150 MG 24 hr tablet Take 1 tablet (150 mg total) by mouth daily. I   clonazePAM (KLONOPIN) 0.5 MG tablet Take 1 tablet (0.5 mg total) by mouth 2 (two) times daily as needed for anxiety.   levothyroxine (SYNTHROID) 75 MCG tablet TAKE 1 TABLET(75 MCG) BY MOUTH DAILY BEFORE BREAKFAST   loperamide (IMODIUM) 2 MG capsule Take by mouth as needed for diarrhea or loose stools.   loratadine (CLARITIN) 10 MG tablet Take 10  mg by mouth daily.   metoprolol succinate (TOPROL-XL) 25 MG 24 hr tablet TAKE 3 TABLETS(75 MG) BY MOUTH DAILY WITH OR IMMEDIATELY FOLLOWING A MEAL   Multiple Vitamin (MULTIVITAMIN) tablet Take 1 tablet by mouth daily.   OLANZapine (ZYPREXA) 5 MG tablet Take 5 mg by mouth at bedtime.   ondansetron (ZOFRAN) 8 MG tablet Take 8 mg by mouth every 8 (eight) hours as needed.    prochlorperazine (COMPAZINE) 5 MG tablet    vitamin B-12 (CYANOCOBALAMIN) 100 MCG tablet Take 100 mcg by mouth daily.   No facility-administered encounter medications on file as of 06/28/2021.    Patient Active Problem List   Diagnosis Date Noted   HSV (herpes simplex virus) anogenital infection 05/30/2021   CKD (chronic kidney disease) stage 3, GFR 30-59 ml/min (HCC) 05/14/2020   Choledocholithiasis 05/14/2020   Weight loss of more than 10% body weight 05/14/2020   Fingernail abnormalities 05/12/2020   Chemotherapy-induced nausea 02/08/2020   MRSA colonization 07/27/2019   Educated about COVID-19 virus infection 02/24/2019   Left carotid bruit 05/09/2018   Tachycardia 08/27/2016   Adenocarcinoma of lung, stage 4, left ([place]) 04/11/2016   Right shoulder pain 01/06/2015   Osteoarthritis of both knees 01/06/2015   Colon cancer screening 04/07/2014   Screening for breast cancer 07/16/2012   Anxiety and depression 07/16/2012   GERD (gastroesophageal reflux disease) 07/16/2012   Hypothyroidism 01/15/2012   Pure hypercholesterolemia 12/12/2011   Endometriosis    Carpal tunnel syndrome    Degenerative joint disease    Hypertension  07/18/2011   Allergic rhinitis 07/18/2011    Conditions to be addressed/monitored:HTN and Lung CA  Care Plan : Hypertension (Adult)  Updates made by Leona Singleton, RN since 06/28/2021 12:00 AM     Problem: Hypertension (Hypertension)   Priority: Medium     Long-Range Goal: Patient will report monitoring blood pressures at least 3 times a week within the next 90 days.   Start  Date: 12/14/2020  Expected End Date: 09/29/2021  This Visit's Progress: On track  Recent Progress: On track  Priority: Medium  Note:   Objective:  Last practice recorded BP readings:  BP Readings from Last 3 Encounters:  05/29/21 118/72  11/25/20 126/78  05/12/20 128/72  Current Barriers:  Knowledge Deficits related to basic understanding of hypertension pathophysiology and self care management as evidenced by patient reporting blood pressure elevations when attending chemotherapy appointments.  States she is happy about blood pressure readings lately.  Ranges have been 110-120/80's.  114/67 last reading.  Denies any hyper or hypotension episodes.  Does report occasional higher readings during beginning of chemo treatments. Case Manager Clinical Goal(s):  patient will verbalize understanding of plan for hypertension management patient will demonstrate improved adherence to prescribed treatment plan for hypertension as evidenced by taking all medications as prescribed, monitoring and recording blood pressure as directed, adhering to low sodium/DASH diet Interventions:  Collaboration with Crecencio Mc, MD regarding development and update of comprehensive plan of care as evidenced by provider attestation and co-signature Inter-disciplinary care team collaboration (see longitudinal plan of care) Evaluation of current treatment plan related to hypertension self management and patient's adherence to plan as established by provider. Reviewed medications with patient and discussed importance of compliance Discussed plans with patient for ongoing care management follow up and provided patient with direct contact information for care management team Advised patient, providing education and rationale, to monitor blood pressure at least 3 times a week and record, calling PCP for findings outside established parameters.  Provided patient with emotional support and empathy Encouraged patient to monitor  blood pressures at least 3 times a week and record pressures in log for provider review at medical appointments Discussed heart healthy diet and importance of eating well balanced diet, encouraged to incorporated and eat more vegetables in diet Reviewed recent blood pressure readings and congratulated patient on ranges Patient Goals/Self-Care Activities: Check blood pressure 3 times per week Write blood pressure results in a log for provider review Notify provider for sustained elevations in blood pressure Eat more vegetables in you diet Follow Up Plan: The care management team will reach out to the patient again over the next 45 business days.        Care Plan : Cancer Treatment Phase (Adult)  Updates made by Leona Singleton, RN since 06/28/2021 12:00 AM     Problem: Psychosocial Response to continued need for lung cancer treatment   Priority: Medium     Long-Range Goal: Patient will report ease of acknowledging feelings and accepting of help related to cotninued need for chemotherapy realted to lung cancer in the next 90 days.   Start Date: 12/14/2020  Expected End Date: 01/28/2022  This Visit's Progress: On track  Recent Progress: On track  Priority: Medium  Note:   Current Barriers:  Difficulty Self Health Maintenance related to continued need for chemotherapy related to lung cancer diagnosis.  Patient reporting she continues with new chemotherapy regime.  Continues to  tolerate so far without any major difficulties.  Occasional nausea managed  with medication.  Occasional need for blood transfusions.  Planning trip to [place] with her siblings Clinical Goal(s):  Collaboration with Crecencio Mc, MD regarding development and update of comprehensive plan of care as evidenced by provider attestation and co-signature Inter-disciplinary care team collaboration (see longitudinal plan of care) patient will work with care management team to address care coordination and chronic disease  management needs related to emotional support of continued treatment of lung cancer.   Interventions:  Evaluation of current treatment plan related to  lung cancer ,  self-management and patient's adherence to plan as established by provider. Collaboration with Crecencio Mc, MD regarding development and update of comprehensive plan of care as evidenced by provider attestation       and co-signature Inter-disciplinary care team collaboration (see longitudinal plan of care) Discussed plans with patient for ongoing care management follow up and provided patient with direct contact information for care management team Counseling provided and decision-making supported Family involvement promoted and patient encouraged to ask for help when needed and she is not able to complete task Self-care encouraged and  self-reflection promoted, provided patient with emotional support and empathy Verbalization of feelings encouraged and encouraged to discuss with family; encouraged patient to continue to seek assistance and support from Palliative team at [facility] promoted Quality of sleep assessed and Relaxation techniques promoted Discussed periods of depression and encouraged continued use of [place] Worker/therapist Reassured patient periods of being down/frustrated is normal and ok as long as she is using resources to get through them Fall precautions and preventions reviewed and discussed Congratulated patient on continued upbeat positive attitude Patient [place] Activities: Eat healthy, take nausea medicine as prescribed Get a least 8 hours of sleep at night  Ask for help when needing it (don't be mad at yourself for needing assistance) Fall precautions and preventions, move slowly, sitting when feeling dizzy Allow yourself time to deal with your emotions Continue to work with Palliative Care Team when needed Follow Up Plan: The care management  team will reach out to the patient again over the next 45 business days.        Plan:The care management team will reach out to the patient again over the next 45 business days.  Hubert Azure RN, MSN RN Care Management Coordinator [place] [phone] [email or web address]

## 2021-06-29 ENCOUNTER — Telehealth: Admit: 2021-06-29 | Discharge: 2021-06-30 | Payer: MEDICARE

## 2021-06-29 ENCOUNTER — Institutional Professional Consult (permissible substitution): Admit: 2021-06-29 | Discharge: 2021-06-30 | Payer: MEDICARE

## 2021-06-29 DIAGNOSIS — R9389 Abnormal findings on diagnostic imaging of other specified body structures: Principal | ICD-10-CM

## 2021-06-29 DIAGNOSIS — Z1159 Encounter for screening for other viral diseases: Principal | ICD-10-CM

## 2021-06-29 DIAGNOSIS — C3492 Malignant neoplasm of unspecified part of left bronchus or lung: Principal | ICD-10-CM

## 2021-06-30 DIAGNOSIS — I10 Essential (primary) hypertension: Secondary | ICD-10-CM | POA: Diagnosis not present

## 2021-06-30 DIAGNOSIS — C3492 Malignant neoplasm of unspecified part of left bronchus or lung: Secondary | ICD-10-CM | POA: Diagnosis not present

## 2021-07-04 ENCOUNTER — Encounter: Admit: 2021-07-04 | Discharge: 2021-07-05 | Payer: MEDICARE | Attending: Adult Health | Primary: Adult Health

## 2021-07-04 ENCOUNTER — Other Ambulatory Visit: Admit: 2021-07-04 | Discharge: 2021-07-05 | Payer: MEDICARE

## 2021-07-04 ENCOUNTER — Institutional Professional Consult (permissible substitution): Admit: 2021-07-04 | Discharge: 2021-07-05 | Payer: MEDICARE

## 2021-07-04 ENCOUNTER — Ambulatory Visit: Admit: 2021-07-04 | Discharge: 2021-07-05 | Payer: MEDICARE

## 2021-07-04 DIAGNOSIS — C3490 Malignant neoplasm of unspecified part of unspecified bronchus or lung: Principal | ICD-10-CM

## 2021-07-04 DIAGNOSIS — D649 Anemia, unspecified: Principal | ICD-10-CM

## 2021-07-05 ENCOUNTER — Other Ambulatory Visit: Admit: 2021-07-05 | Discharge: 2021-07-06 | Payer: MEDICARE

## 2021-07-05 ENCOUNTER — Ambulatory Visit: Admit: 2021-07-05 | Discharge: 2021-07-06 | Payer: MEDICARE

## 2021-07-05 DIAGNOSIS — C3490 Malignant neoplasm of unspecified part of unspecified bronchus or lung: Principal | ICD-10-CM

## 2021-07-06 DIAGNOSIS — R9389 Abnormal findings on diagnostic imaging of other specified body structures: Principal | ICD-10-CM

## 2021-07-06 DIAGNOSIS — Z1159 Encounter for screening for other viral diseases: Principal | ICD-10-CM

## 2021-07-06 DIAGNOSIS — C3492 Malignant neoplasm of unspecified part of left bronchus or lung: Principal | ICD-10-CM

## 2021-07-10 ENCOUNTER — Ambulatory Visit: Admit: 2021-07-10 | Discharge: 2021-07-11 | Payer: MEDICARE

## 2021-07-11 DIAGNOSIS — R9389 Abnormal findings on diagnostic imaging of other specified body structures: Principal | ICD-10-CM

## 2021-07-11 DIAGNOSIS — C3492 Malignant neoplasm of unspecified part of left bronchus or lung: Principal | ICD-10-CM

## 2021-07-11 DIAGNOSIS — C3490 Malignant neoplasm of unspecified part of unspecified bronchus or lung: Principal | ICD-10-CM

## 2021-07-11 DIAGNOSIS — D649 Anemia, unspecified: Principal | ICD-10-CM

## 2021-07-11 DIAGNOSIS — Z1159 Encounter for screening for other viral diseases: Principal | ICD-10-CM

## 2021-07-12 ENCOUNTER — Other Ambulatory Visit: Admit: 2021-07-12 | Discharge: 2021-07-12 | Payer: MEDICARE

## 2021-07-12 ENCOUNTER — Ambulatory Visit
Admit: 2021-07-12 | Discharge: 2021-07-12 | Payer: MEDICARE | Attending: Hematology & Oncology | Primary: Hematology & Oncology

## 2021-07-12 ENCOUNTER — Ambulatory Visit: Admit: 2021-07-12 | Discharge: 2021-07-12 | Payer: MEDICARE

## 2021-08-01 ENCOUNTER — Ambulatory Visit: Admit: 2021-08-01 | Discharge: 2021-08-02 | Payer: MEDICARE

## 2021-08-01 ENCOUNTER — Other Ambulatory Visit: Admit: 2021-08-01 | Discharge: 2021-08-02 | Payer: MEDICARE

## 2021-08-01 ENCOUNTER — Ambulatory Visit
Admit: 2021-08-01 | Discharge: 2021-08-02 | Payer: MEDICARE | Attending: Hematology & Oncology | Primary: Hematology & Oncology

## 2021-08-01 DIAGNOSIS — C3490 Malignant neoplasm of unspecified part of unspecified bronchus or lung: Principal | ICD-10-CM

## 2021-08-01 DIAGNOSIS — F32A Depression, unspecified: Secondary | ICD-10-CM

## 2021-08-01 DIAGNOSIS — F419 Anxiety disorder, unspecified: Secondary | ICD-10-CM

## 2021-08-01 DIAGNOSIS — Z1159 Encounter for screening for other viral diseases: Principal | ICD-10-CM

## 2021-08-01 DIAGNOSIS — C3492 Malignant neoplasm of unspecified part of left bronchus or lung: Principal | ICD-10-CM

## 2021-08-01 DIAGNOSIS — R9389 Abnormal findings on diagnostic imaging of other specified body structures: Principal | ICD-10-CM

## 2021-08-01 MED ORDER — BUPROPION HCL ER (XL) 150 MG PO TB24: 150.0000 mg | ORAL_TABLET | Freq: Every day | ORAL | 1 refills | Status: DC

## 2021-08-08 ENCOUNTER — Other Ambulatory Visit: Admit: 2021-08-08 | Discharge: 2021-08-09 | Payer: MEDICARE

## 2021-08-08 ENCOUNTER — Ambulatory Visit: Admit: 2021-08-08 | Discharge: 2021-08-09 | Payer: MEDICARE

## 2021-08-08 DIAGNOSIS — Z1159 Encounter for screening for other viral diseases: Principal | ICD-10-CM

## 2021-08-08 DIAGNOSIS — R9389 Abnormal findings on diagnostic imaging of other specified body structures: Principal | ICD-10-CM

## 2021-08-08 DIAGNOSIS — C3492 Malignant neoplasm of unspecified part of left bronchus or lung: Principal | ICD-10-CM

## 2021-08-10 ENCOUNTER — Telehealth: Admit: 2021-08-10 | Discharge: 2021-08-11 | Payer: MEDICARE

## 2021-08-11 ENCOUNTER — Telehealth: Payer: Medicare Other

## 2021-08-11 DIAGNOSIS — C3492 Malignant neoplasm of unspecified part of left bronchus or lung: Principal | ICD-10-CM

## 2021-08-22 ENCOUNTER — Ambulatory Visit: Admit: 2021-08-22 | Discharge: 2021-08-23 | Payer: MEDICARE

## 2021-08-22 ENCOUNTER — Ambulatory Visit: Admit: 2021-08-22 | Discharge: 2021-08-23 | Payer: MEDICARE | Attending: Adult Health | Primary: Adult Health

## 2021-08-22 ENCOUNTER — Other Ambulatory Visit: Admit: 2021-08-22 | Discharge: 2021-08-23 | Payer: MEDICARE

## 2021-08-22 DIAGNOSIS — R9389 Abnormal findings on diagnostic imaging of other specified body structures: Principal | ICD-10-CM

## 2021-08-22 DIAGNOSIS — C3492 Malignant neoplasm of unspecified part of left bronchus or lung: Principal | ICD-10-CM

## 2021-08-22 DIAGNOSIS — C3490 Malignant neoplasm of unspecified part of unspecified bronchus or lung: Principal | ICD-10-CM

## 2021-08-22 DIAGNOSIS — Z1159 Encounter for screening for other viral diseases: Principal | ICD-10-CM

## 2021-08-29 ENCOUNTER — Ambulatory Visit: Admit: 2021-08-29 | Discharge: 2021-08-29 | Payer: MEDICARE

## 2021-08-29 ENCOUNTER — Other Ambulatory Visit: Admit: 2021-08-29 | Discharge: 2021-08-29 | Payer: MEDICARE

## 2021-08-29 DIAGNOSIS — Z1159 Encounter for screening for other viral diseases: Principal | ICD-10-CM

## 2021-08-29 DIAGNOSIS — R9389 Abnormal findings on diagnostic imaging of other specified body structures: Principal | ICD-10-CM

## 2021-08-29 DIAGNOSIS — C3492 Malignant neoplasm of unspecified part of left bronchus or lung: Principal | ICD-10-CM

## 2021-08-29 MED ORDER — METHYLPHENIDATE 5 MG TABLET
ORAL_TABLET | TRANSDERMAL | 0 refills | 0.00000 days | Status: CP
Start: 2021-08-29 — End: ?

## 2021-09-02 ENCOUNTER — Other Ambulatory Visit: Payer: Self-pay | Admitting: Internal Medicine

## 2021-09-11 ENCOUNTER — Ambulatory Visit: Admit: 2021-09-11 | Discharge: 2021-09-12 | Payer: MEDICARE

## 2021-09-11 DIAGNOSIS — R9389 Abnormal findings on diagnostic imaging of other specified body structures: Principal | ICD-10-CM

## 2021-09-11 DIAGNOSIS — C3492 Malignant neoplasm of unspecified part of left bronchus or lung: Principal | ICD-10-CM

## 2021-09-12 ENCOUNTER — Ambulatory Visit: Admit: 2021-09-12 | Discharge: 2021-09-13 | Payer: MEDICARE

## 2021-09-12 ENCOUNTER — Ambulatory Visit
Admit: 2021-09-12 | Discharge: 2021-09-13 | Payer: MEDICARE | Attending: Hematology & Oncology | Primary: Hematology & Oncology

## 2021-09-12 ENCOUNTER — Other Ambulatory Visit: Admit: 2021-09-12 | Discharge: 2021-09-13 | Payer: MEDICARE

## 2021-09-12 DIAGNOSIS — C3490 Malignant neoplasm of unspecified part of unspecified bronchus or lung: Principal | ICD-10-CM

## 2021-09-12 DIAGNOSIS — C388 Malignant neoplasm of overlapping sites of heart, mediastinum and pleura: Principal | ICD-10-CM

## 2021-09-12 DIAGNOSIS — R110 Nausea: Principal | ICD-10-CM

## 2021-09-12 DIAGNOSIS — C3492 Malignant neoplasm of unspecified part of left bronchus or lung: Principal | ICD-10-CM

## 2021-09-12 DIAGNOSIS — R9389 Abnormal findings on diagnostic imaging of other specified body structures: Principal | ICD-10-CM

## 2021-09-12 DIAGNOSIS — Z1159 Encounter for screening for other viral diseases: Principal | ICD-10-CM

## 2021-09-12 DIAGNOSIS — Z87898 Personal history of other specified conditions: Principal | ICD-10-CM

## 2021-09-12 DIAGNOSIS — Z515 Encounter for palliative care: Principal | ICD-10-CM

## 2021-09-12 DIAGNOSIS — R530 Neoplastic (malignant) related fatigue: Principal | ICD-10-CM

## 2021-09-12 DIAGNOSIS — R630 Anorexia: Principal | ICD-10-CM

## 2021-09-12 MED ORDER — LOPERAMIDE 2 MG CAPSULE
ORAL_CAPSULE | Freq: Four times a day (QID) | ORAL | 1 refills | 68.00000 days | Status: CP | PRN
Start: 2021-09-12 — End: 2021-09-12

## 2021-09-12 MED ORDER — METHYLPHENIDATE 5 MG TABLET
ORAL_TABLET | Freq: Two times a day (BID) | ORAL | 0 refills | 30.00000 days | Status: CP
Start: 2021-09-12 — End: ?

## 2021-09-12 MED ORDER — DRONABINOL 2.5 MG CAPSULE
ORAL_CAPSULE | Freq: Two times a day (BID) | ORAL | 0 refills | 30 days | Status: CP
Start: 2021-09-12 — End: 2021-10-12

## 2021-09-12 MED ORDER — PROCHLORPERAZINE MALEATE 5 MG TABLET
ORAL_TABLET | Freq: Four times a day (QID) | ORAL | 1 refills | 15 days | Status: CP | PRN
Start: 2021-09-12 — End: ?

## 2021-09-12 MED ORDER — MINOCYCLINE 50 MG CAPSULE
ORAL_CAPSULE | Freq: Two times a day (BID) | ORAL | 3 refills | 90 days | Status: CP
Start: 2021-09-12 — End: ?

## 2021-09-19 ENCOUNTER — Ambulatory Visit: Admit: 2021-09-19 | Payer: MEDICARE

## 2021-09-25 ENCOUNTER — Other Ambulatory Visit: Payer: Self-pay | Admitting: Internal Medicine

## 2021-09-26 DIAGNOSIS — C3492 Malignant neoplasm of unspecified part of left bronchus or lung: Principal | ICD-10-CM

## 2021-09-26 MED ORDER — ONDANSETRON HCL 8 MG TABLET
ORAL_TABLET | Freq: Three times a day (TID) | ORAL | 2 refills | 10.00000 days | Status: CP | PRN
Start: 2021-09-26 — End: ?

## 2021-09-26 MED ORDER — LAPATINIB 250 MG TABLET
ORAL_TABLET | Freq: Every day | ORAL | 11 refills | 30 days | Status: CP
Start: 2021-09-26 — End: ?

## 2021-09-27 ENCOUNTER — Institutional Professional Consult (permissible substitution): Admit: 2021-09-27 | Discharge: 2021-09-28 | Payer: MEDICARE

## 2021-09-27 DIAGNOSIS — C3492 Malignant neoplasm of unspecified part of left bronchus or lung: Principal | ICD-10-CM

## 2021-10-03 ENCOUNTER — Ambulatory Visit: Admit: 2021-10-03 | Payer: MEDICARE

## 2021-10-09 ENCOUNTER — Ambulatory Visit: Admit: 2021-10-09 | Discharge: 2021-10-10 | Payer: MEDICARE

## 2021-10-10 ENCOUNTER — Ambulatory Visit: Admit: 2021-10-10 | Discharge: 2021-10-10 | Payer: MEDICARE

## 2021-10-10 ENCOUNTER — Other Ambulatory Visit: Admit: 2021-10-10 | Discharge: 2021-10-10 | Payer: MEDICARE

## 2021-10-10 ENCOUNTER — Ambulatory Visit
Admit: 2021-10-10 | Discharge: 2021-10-10 | Payer: MEDICARE | Attending: Hematology & Oncology | Primary: Hematology & Oncology

## 2021-10-10 DIAGNOSIS — C3492 Malignant neoplasm of unspecified part of left bronchus or lung: Principal | ICD-10-CM

## 2021-10-10 DIAGNOSIS — R530 Neoplastic (malignant) related fatigue: Principal | ICD-10-CM

## 2021-10-10 DIAGNOSIS — R110 Nausea: Principal | ICD-10-CM

## 2021-10-10 DIAGNOSIS — K521 Toxic gastroenteritis and colitis: Principal | ICD-10-CM

## 2021-10-12 ENCOUNTER — Telehealth: Admit: 2021-10-12 | Discharge: 2021-10-13 | Payer: MEDICARE

## 2021-10-18 ENCOUNTER — Other Ambulatory Visit: Payer: Self-pay

## 2021-10-18 ENCOUNTER — Encounter: Payer: Self-pay | Admitting: Adult Health

## 2021-10-18 ENCOUNTER — Ambulatory Visit (INDEPENDENT_AMBULATORY_CARE_PROVIDER_SITE_OTHER): Payer: Medicare Other | Admitting: Adult Health

## 2021-10-18 VITALS — BP 128/80 | HR 67 | Ht 62.01 in | Wt 137.2 lb

## 2021-10-18 DIAGNOSIS — R944 Abnormal results of kidney function studies: Secondary | ICD-10-CM | POA: Insufficient documentation

## 2021-10-18 DIAGNOSIS — L03032 Cellulitis of left toe: Secondary | ICD-10-CM | POA: Diagnosis not present

## 2021-10-18 DIAGNOSIS — D849 Immunodeficiency, unspecified: Secondary | ICD-10-CM

## 2021-10-18 DIAGNOSIS — L600 Ingrowing nail: Secondary | ICD-10-CM

## 2021-10-18 MED ORDER — CEPHALEXIN 500 MG PO CAPS: 500.0000 mg | ORAL_CAPSULE | Freq: Two times a day (BID) | ORAL | 0 refills | Status: DC

## 2021-10-18 MED ORDER — MUPIROCIN 2 % EX OINT: 1.0000 "application " | TOPICAL_OINTMENT | Freq: Two times a day (BID) | CUTANEOUS | 1 refills | Status: DC

## 2021-10-18 NOTE — Progress Notes (Signed)
Acute Office Visit  Subjective:    Patient ID: Stacy Santana, female    DOB: 1950/10/17, 71 y.o.   MRN: [id number]  Chief Complaint  Patient presents with   Toe Pain    HPI Patient is in today for left great toe pain and she has had this for about 2 weeks.  She is immunocompromised and has been on chemotherapy and she is no longer on it. Using  TYKERB  at [facility]. No longer doing chemotherapy.  She is going on a trip to the [place] February 4th. .  Decreased GFR noted. History of lung cancer she reports they are now talking hospice.   Patient  denies any fever, body aches,chills, rash, chest pain, no new shortness of breath, nausea, vomiting, or diarrhea.  Denies dizziness, lightheadedness, pre syncopal or syncopal episodes.    Past Medical History:  Diagnosis Date   Allergy    seasonal   Carpal tunnel syndrome    Degenerative joint disease    Depression    Endometriosis 1989   small amount   Glaucoma    left eye, Dr. Morrison Old in [place]   HSV infection    Hypertension    Osteoporosis    osteopenia, Fosamax then Boniva 2009   Vitamin D deficiency    on supplements    Past Surgical History:  Procedure Laterality Date   APPENDECTOMY  1964   open   CARPAL TUNNEL RELEASE Right 01/2014   Dr. Malvin Johns   FOOT NEUROMA SURGERY  May 2016   Left foot   [place]  2012   Dr. Pryor Ochoa   THROAT SURGERY  05/02/2018   TONSILLECTOMY     TUBAL LIGATION  1992    Family History  Problem Relation Age of Onset   Osteoporosis Mother        secondary to steroids   Rheum arthritis Mother    Hypertension Mother    COPD Mother    Arthritis Mother    Stroke Mother    Cancer Father        prostate   Depression Son    Anxiety disorder Son    ADD / ADHD Son    ADD / ADHD Son    Hypertrophic cardiomyopathy Sister    Schizophrenia Paternal Aunt    Heart disease Maternal Grandmother    COPD Maternal Grandfather    Depression Maternal Uncle     Social  History   Socioeconomic History   Marital status: Married    Spouse name: Not on file   Number of children: Not on file   Years of education: Not on file   Highest education level: Not on file  Occupational History   Not on file  Tobacco Use   Smoking status: Former    Packs/day: 1.00    Years: 16.00    Pack years: 16.00    Types: Cigarettes    Quit date: 10/02/1983    Years since quitting: 38.0   Smokeless tobacco: Never  Vaping Use   Vaping Use: Never used  Substance and Sexual Activity   Alcohol use: Yes    Comment: occassionally   Drug use: No   Sexual activity: Yes  Other Topics Concern   Not on file  Social History Narrative   Not on file   Social Determinants of Health   Financial Resource Strain: Low Risk    Difficulty of Paying Living Expenses: Not hard at all  Food Insecurity:  No Food Insecurity   Worried About Charity fundraiser in the Last Year: Never true   Ran Out of Food in the Last Year: Never true  Transportation Needs: No Transportation Needs   Lack of Transportation (Medical): No   Lack of Transportation (Non-Medical): No  Physical Activity: Not on file  Stress: No Stress Concern Present   Feeling of Stress : Not at all  Social Connections: Moderately Integrated   Frequency of Communication with Friends and Family: More than three times a week   Frequency of Social Gatherings with Friends and Family: Three times a week   Attends Religious Services: 1 to 4 times per year   Active Member of Clubs or Organizations: No   Attends Archivist Meetings: Never   Marital Status: Married  Human resources officer Violence: Not At Risk   Fear of Current or Ex-Partner: No   Emotionally Abused: No   Physically Abused: No   Sexually Abused: No    Outpatient Medications Prior to Visit  Medication Sig Dispense Refill   acyclovir ointment (ZOVIRAX) 5 % Apply 1 application topically every 3 (three) hours. 15 g 1   azelastine (ASTELIN) 0.1 % nasal spray  PLACE 1 SPRAY INTO BOTH NOSTRILS AT BEDTIME AS NEEDED 30 mL 6   buPROPion (WELLBUTRIN XL) 150 MG 24 hr tablet Take 1 tablet (150 mg total) by mouth daily. I 90 tablet 1   clonazePAM (KLONOPIN) 0.5 MG tablet Take 1 tablet (0.5 mg total) by mouth 2 (two) times daily as needed for anxiety. 60 tablet 5   lapatinib (TYKERB) 250 MG tablet Take by mouth.     levothyroxine (SYNTHROID) 75 MCG tablet TAKE 1 TABLET(75 MCG) BY MOUTH DAILY BEFORE AND BREAKFAST 90 tablet 0   loperamide (IMODIUM) 2 MG capsule Take by mouth as needed for diarrhea or loose stools.     loratadine (CLARITIN) 10 MG tablet Take 10 mg by mouth daily.     metoprolol succinate (TOPROL-XL) 25 MG 24 hr tablet TAKE 3 TABLETS(75 MG) BY MOUTH DAILY WITH OR IMMEDIATELY FOLLOWING A MEAL 270 tablet 1   Multiple Vitamin (MULTIVITAMIN) tablet Take 1 tablet by mouth daily.     ondansetron (ZOFRAN) 8 MG tablet Take 8 mg by mouth every 8 (eight) hours as needed.      prochlorperazine (COMPAZINE) 5 MG tablet      vitamin B-12 (CYANOCOBALAMIN) 100 MCG tablet Take 100 mcg by mouth daily.     OLANZapine (ZYPREXA) 5 MG tablet Take 5 mg by mouth at bedtime.     No facility-administered medications prior to visit.    Allergies  Allergen Reactions   Amlodipine     edema   Codeine     Nausea and vomiting    Review of Systems  Constitutional:  Positive for fatigue. Negative for activity change, appetite change, chills, diaphoresis, fever and unexpected weight change.  HENT: Negative.    Eyes: Negative.   Respiratory:  Positive for shortness of breath (chronic no change with lung cancer). Negative for apnea, cough, choking, chest tightness, wheezing and stridor.   Cardiovascular: Negative.   Gastrointestinal: Negative.   Genitourinary: Negative.   Musculoskeletal: Negative.   Skin:  Positive for color change. Negative for pallor, rash and wound.  Neurological:  Positive for weakness (chronic with cancer treatments and lung cancer).   Hematological: Negative.   Psychiatric/Behavioral: Negative.        Objective:    Physical Exam Cardiovascular:     Pulses:  Dorsalis pedis pulses are 2+ on the right side and 2+ on the left side.       Posterior tibial pulses are 2+ on the right side and 2+ on the left side.  Musculoskeletal:       Feet:  Feet:     Right foot:     Skin integrity: Skin integrity normal.     Left foot:     Skin integrity: Erythema and warmth present.     Toenail Condition: Left toenails are ingrown.     Comments: Slightly ingrown left great toenail- erythematous area mild as on diagram. Small paronychia no drainage. Tender to touch. No edema.  Neurological:     Mental Status: She is alert.   Left great toe affected only.   General: Appearance:     Frail female in no acute distress  Eyes:    PERRL, conjunctiva/corneas clear, EOM's intact       Lungs:     Clear to auscultation bilaterally, respirations unlabored  Heart:    Normal heart rate. Normal rhythm. No murmurs, rubs, or gallops.    MS:   All extremities are intact.    Neurologic:   Awake, alert, oriented x 3. No apparent focal neurological           defect.      BP 128/80    Pulse 67    Ht 5' 2.01" (1.575 m)    Wt 137 lb 3.2 oz (62.2 kg)    SpO2 99%    BMI 25.09 kg/m  Wt Readings from Last 3 Encounters:  10/18/21 137 lb 3.2 oz (62.2 kg)  05/29/21 142 lb 6.4 oz (64.6 kg)  11/25/20 143 lb (64.9 kg)    Health Maintenance Due  Topic Date Due   Zoster Vaccines- Shingrix (1 of 2) Never done    There are no preventive care reminders to display for this patient.   Lab Results  Component Value Date   TSH 0.74 08/16/2020   Lab Results  Component Value Date   WBC 7.1 05/28/2019   HGB 12.6 05/28/2019   HCT 36.7 05/28/2019   MCV 87.1 05/28/2019   PLT 316.0 05/28/2019   Lab Results  Component Value Date   NA 139 05/29/2021   K 3.8 05/29/2021   CO2 25 05/29/2021   GLUCOSE 89 05/29/2021   BUN 20 05/29/2021    CREATININE 1.27 (H) 05/29/2021   BILITOT 0.3 05/29/2021   ALKPHOS 97 05/29/2021   AST 17 05/29/2021   ALT 9 05/29/2021   PROT 6.6 05/29/2021   ALBUMIN 4.2 05/29/2021   CALCIUM 9.5 05/29/2021   ANIONGAP 11 08/11/2012   GFR 42.29 (L) 05/29/2021   Lab Results  Component Value Date   CHOL 188 05/29/2021   Lab Results  Component Value Date   HDL 52.60 05/29/2021   Lab Results  Component Value Date   LDLCALC 102 (H) 05/29/2021   Lab Results  Component Value Date   TRIG 166.0 (H) 05/29/2021   Lab Results  Component Value Date   CHOLHDL 4 05/29/2021   Lab Results  Component Value Date   HGBA1C 5.0 08/16/2020       Assessment & Plan:   Problem List Items Addressed This Visit       Musculoskeletal and Integument   Paronychia of great toe of left foot - Primary   Relevant Medications   cephALEXin (KEFLEX) 500 MG capsule   mupirocin ointment (BACTROBAN) 2 %   Other Relevant Orders  Ambulatory referral to Podiatry   Ingrown toenail   Relevant Medications   mupirocin ointment (BACTROBAN) 2 %   Other Relevant Orders   Ambulatory referral to Podiatry     Other   Decreased GFR   Relevant Medications   cephALEXin (KEFLEX) 500 MG capsule   Other Relevant Orders   Ambulatory referral to Podiatry   Immunocompromised state [facility])   Relevant Orders   Ambulatory referral to Podiatry     Meds ordered this encounter  Medications   cephALEXin (KEFLEX) 500 MG capsule    Sig: Take 1 capsule (500 mg total) by mouth 2 (two) times daily.    Dispense:  30 capsule    Refill:  0   mupirocin ointment (BACTROBAN) 2 %    Sig: Apply 1 application topically 2 (two) times daily. Apply thin layer only.    Dispense:  22 g    Refill:  1   Referral to DR. [employer]. Keflex dosed lower due to GFR.  Orders Placed This Encounter  Procedures   Ambulatory referral to Podiatry    Referral Priority:   Routine    Referral Type:   Consultation    Referral Reason:   Specialty  Services Required    Referred to Provider:   Edrick Kins, DPM    Requested Specialty:   Podiatry    Number of Visits Requested:   1    Return in about 1 week (around 10/25/2021), or if symptoms worsen or fail to improve, for at any time for any worsening symptoms, Go to Emergency room/ urgent care if worse.    Marcille Buffy, FNP

## 2021-10-18 NOTE — Patient Instructions (Signed)
Mupirocin Cream or Ointment What is this medication? MUPIROCIN (myoo PEER oh sin) treats skin infections caused by bacteria. It works by killing or preventing the growth of bacteria on the skin. This medicine may be used for other purposes; ask your health care provider or pharmacist if you have questions. COMMON BRAND NAME(S): Bactroban, Centany, Centany AT What should I tell my care team before I take this medication? They need to know if you have any of these conditions: Kidney disease Large areas of burned or damaged skin Stomach or intestine problems such as colitis An unusual or allergic reaction to mupirocin, other medications, foods, dyes, or preservatives Pregnant or trying to get pregnant Breast-feeding How should I use this medication? This medication is for external use only. Do not take by mouth. Wash your hands before and after use. If you are treating a hand infection, only wash your hands before use. Do not get it in your eyes. If you do, rinse your eyes with plenty of cool tap water. Use it as directed on the prescription label at the same time every day. Do not use it more often than directed. Use the medication for the full course as directed by your care team, even if you think you are better. Do not stop using it unless your care team tells you to stop it early. Apply a thin film of the medication to the affected area. You can cover the area with a sterile gauze dressing (bandage). Do not use an airtight bandage (such as a plastic-covered bandage). Talk to your care team about the use of this medication in children. While it may be prescribed for children for selected conditions, precautions do apply. Overdosage: If you think you have taken too much of this medicine contact a poison control center or emergency room at once. NOTE: This medicine is only for you. Do not share this medicine with others. What if I miss a dose? If you miss a dose, use it as soon as you can. If it  is almost time for your next dose, use only that dose. Do not use double or extra doses. What may interact with this medication? Interactions are not expected. Do not use any other skin products on the affected area without telling your care team. This list may not describe all possible interactions. Give your health care provider a list of all the medicines, herbs, non-prescription drugs, or dietary supplements you use. Also tell them if you smoke, drink alcohol, or use illegal drugs. Some items may interact with your medicine. What should I watch for while using this medication? Tell your care team if your symptoms do not start to get better or if they get worse. Do not treat diarrhea with over the counter products. Contact your care team if you have diarrhea that lasts more than 2 days or if it is severe and watery. What side effects may I notice from receiving this medication? Side effects that you should report to your care team as soon as possible: Allergic reactions--skin rash, itching, hives, swelling of the face, lips, tongue, or throat Burning, itching, crusting, or peeling of treated skin Severe diarrhea, fever Side effects that usually do not require medical attention (report to your care team if they continue or are bothersome): Mild skin irritation, redness, or dryness This list may not describe all possible side effects. Call your doctor for medical advice about side effects. You may report side effects to FDA at 1-800-FDA-1088. Where should I keep my  medication? Keep out of the reach of children and pets. Store at room temperature between 20 and 25 degrees C (68 and 77 degrees F). Do not freeze. Get rid of any unused medication after the expiration date. NOTE: This sheet is a summary. It may not cover all possible information. If you have questions about this medicine, talk to your doctor, pharmacist, or health care provider.  2022 Elsevier/Gold Standard (2021-01-22  00:00:00) Cephalexin Capsules or Tablets What is this medication? CEPHALEXIN (sef a LEX in) treats infections caused by bacteria. It belongs to a group of medications called cephalosporin antibiotics. It will not treat colds, the flu, or infections caused by viruses. This medicine may be used for other purposes; ask your health care provider or pharmacist if you have questions. COMMON BRAND NAME(S): Biocef, Daxbia, Keflex, Keftab What should I tell my care team before I take this medication? They need to know if you have any of these conditions: Bleeding disorder Kidney disease Liver disease Seizures Stomach or intestine problems like colitis An unusual or allergic reaction to cephalexin, other penicillin or cephalosporin antibiotics, other medications, foods, dyes, or preservatives Pregnant or trying to get pregnant Breast-feeding How should I use this medication? Take this medication by mouth. Take it as directed on the prescription label at the same time every day. You can take it with or without food. If it upsets your stomach, take it with food. Take all of this medication unless your care team tells you to stop it early. Keep taking it even if you think you are better. Talk to your care team about the use of this medication in children. While it may be prescribed for selected conditions, precautions do apply. Overdosage: If you think you have taken too much of this medicine contact a poison control center or emergency room at once. NOTE: This medicine is only for you. Do not share this medicine with others. What if I miss a dose? If you miss a dose, take it as soon as you can. If it is almost time for your next dose, take only that dose. Do not take double or extra doses. What may interact with this medication? Probenecid Some other antibiotics This list may not describe all possible interactions. Give your health care provider a list of all the medicines, herbs, non-prescription  drugs, or dietary supplements you use. Also tell them if you smoke, drink alcohol, or use illegal drugs. Some items may interact with your medicine. What should I watch for while using this medication? Tell your care team if your symptoms do not start to get better or if they get worse. Do not treat diarrhea with over the counter products. Contact your care team if you have diarrhea that lasts more than 2 days or if it is severe and watery. This medication may cause serious skin reactions. They can happen weeks to months after starting the medication. Contact your care team right away if you notice fevers or flu-like symptoms with a rash. The rash may be red or purple and then turn into blisters or peeling of the skin. Or, you might notice a red rash with swelling of the face, lips or lymph nodes in your neck or under your arms. If you have diabetes, you may get a false-positive result for sugar in your urine. Check with your care team. What side effects may I notice from receiving this medication? Side effects that you should report to your care team as soon as possible: Allergic reactions--skin rash,  itching, hives, swelling of the face, lips, tongue, or throat Redness, blistering, peeling, or loosening of the skin, including inside the mouth Severe diarrhea, fever Unusual vaginal discharge, itching, or odor Side effects that usually do not require medical attention (report to your care team if they continue or are bothersome): Diarrhea Headache Nausea This list may not describe all possible side effects. Call your doctor for medical advice about side effects. You may report side effects to FDA at 1-800-FDA-1088. Where should I keep my medication? Keep out of the reach of children and pets. Store at room temperature between 20 and 25 degrees C (68 and 77 degrees F). Throw away any unused medication after the expiration date. NOTE: This sheet is a summary. It may not cover all possible  information. If you have questions about this medicine, talk to your doctor, pharmacist, or health care provider.  2022 Elsevier/Gold Standard (2020-09-12 00:00:00) Paronychia Paronychia is an infection of the skin. It happens near a fingernail or toenail. It may cause pain and swelling around the nail. In some cases, a fluid-filled bump (abscess) can form near or under the nail. Often, this condition is not serious, and it clears up with treatment. What are the causes? This condition may be caused by a germ. The germ may be bacteria or a fungus. These germs can enter the body through an opening in the skin, such as a cut or a hangnail. Other causes include: Repeated injuries to your fingernails or toenails. Irritation of the base and sides of the nail (cuticle). What increases the risk? This condition is more likely to develop in people who: Get their hands wet often, such as a dishwasher. Bite their fingernails or the base and sides of their nails. Have other skin problems. Have hangnails or hurt fingertips. Come into contact with chemicals like detergents. Have diabetes. What are the signs or symptoms? Redness and swelling of the skin near the nail. A tender feeling around the nail. Pus-filled bumps under the skin at the base and sides of the nail. Fluid or pus under the nail. Pain in the area. How is this treated? Treatment depends on the cause of your condition and how bad it is. If your condition is mild, it may clear up on its own in a few days or after soaking in warm water. If needed, treatment may include: Antibiotic medicine. Antifungal medicine. A procedure to drain pus from a fluid-filled bump. Medicine to treat irritation and swelling (corticosteroids). Taking off part of an ingrown toenail. A bandage (dressing) may be placed over the nail area. Follow these instructions at home: Wound care Keep the affected area clean. Soak the fingers or toes in warm water as told  by your doctor. You may be told to do this for 20 minutes, 2-3 times a day. Keep the area dry when you are not soaking it. Do not try to drain a fluid-filled bump on your own. Follow instructions from your doctor about how to take care of the affected area. Make sure you: Wash your hands with soap and water for at least 20 seconds before and after you change your bandage. If you cannot use soap and water, use hand sanitizer. Change your bandage as told by your doctor. If you had a fluid-filled bump and your doctor drained it, check the area every day for signs of infection. Check for: Redness, swelling, or pain. Fluid or blood. Warmth. Pus or a bad smell. Medicines  Take over-the-counter and prescription medicines only  as told by your doctor. If you were prescribed an antibiotic medicine, take it as told by your doctor. Do not stop taking it even if you start to feel better. General instructions Avoid contact with anything that irritates your skin or that you are allergic to. Do not pick at the affected area. Keep all follow-up visits. Prevention To prevent this condition from happening again: Wear rubber gloves when putting your hands in water for washing dishes or other tasks. Wear gloves if your hands might touch cleaners or chemicals. Avoid injuring your nails or fingertips. Do not bite your nails or tear hangnails. Do not cut your nails very short. Do not cut the skin at the base and sides of the nail. Use clean nail clippers or scissors when trimming nails. Contact a doctor if: You feel worse. You do not get better. You keep having or you have more fluid, blood, or pus coming from the affected area. Your affected finger, toe, or joint gets swollen or hard to move. You have a fever or chills. There is redness spreading from the affected area. Summary Paronychia is an infection of the skin. It happens near a fingernail or toenail. This condition may cause pain and swelling  around the nail. Soak the fingers or toes in warm water as told by your doctor. Often, this condition is not serious, and it clears up with treatment. This information is not intended to replace advice given to you by your health care provider. Make sure you discuss any questions you have with your health care provider. Document Revised: 12/19/2020 Document Reviewed: 12/19/2020 Elsevier Patient Education  [place].

## 2021-10-19 ENCOUNTER — Ambulatory Visit: Admit: 2021-10-19 | Discharge: 2021-10-19 | Payer: MEDICARE

## 2021-10-19 ENCOUNTER — Telehealth: Admit: 2021-10-19 | Discharge: 2021-10-19 | Payer: MEDICARE

## 2021-10-19 ENCOUNTER — Institutional Professional Consult (permissible substitution): Admit: 2021-10-19 | Discharge: 2021-10-19 | Payer: MEDICARE

## 2021-10-19 DIAGNOSIS — C3492 Malignant neoplasm of unspecified part of left bronchus or lung: Principal | ICD-10-CM

## 2021-10-24 ENCOUNTER — Encounter: Payer: Self-pay | Admitting: Internal Medicine

## 2021-10-24 DIAGNOSIS — F32A Depression, unspecified: Secondary | ICD-10-CM

## 2021-10-24 MED ORDER — BUPROPION HCL ER (XL) 150 MG PO TB24: 150.0000 mg | ORAL_TABLET | Freq: Every day | ORAL | 1 refills | Status: DC

## 2021-10-30 ENCOUNTER — Ambulatory Visit (INDEPENDENT_AMBULATORY_CARE_PROVIDER_SITE_OTHER): Payer: Medicare Other

## 2021-10-30 VITALS — Ht 62.0 in | Wt 131.0 lb

## 2021-10-30 DIAGNOSIS — Z0000 Encounter for general adult medical examination without abnormal findings: Secondary | ICD-10-CM | POA: Diagnosis not present

## 2021-10-30 NOTE — Progress Notes (Addendum)
Subjective:   Stacy Santana is a 71 y.o. female who presents for Medicare Annual (Subsequent) preventive examination.  Review of Systems    No ROS.  Medicare Wellness Virtual Visit.  Visual/audio telehealth visit, UTA vital signs.   See social history for additional risk factors.   Cardiac Risk Factors include: advanced age (>55men, >22 women)     Objective:    Today's Vitals   10/30/21 1121  Weight: 131 lb (59.4 kg)  Height: 5\' 2"  (1.575 m)   Body mass index is 23.96 kg/m.  Advanced Directives 10/30/2021 12/14/2020 10/27/2020 10/16/2019 06/21/2015 01/31/2015  Does Patient Have a Medical Advance Directive? Yes Yes Yes Yes Yes Yes  Type of Paramedic of [place];Living will [place];Living will [place];Living will [place];Living will [place];Living will [place]  Does patient want to make changes to medical advance directive? No - Patient declined No - Patient declined No - Patient declined No - Patient declined No - Patient declined -  Copy of [place] in Chart? No - copy requested No - copy requested No - copy requested No - copy requested No - copy requested -    Current Medications (verified) Outpatient Encounter Medications as of 10/30/2021  Medication Sig   acyclovir ointment (ZOVIRAX) 5 % Apply 1 application topically every 3 (three) hours.   azelastine (ASTELIN) 0.1 % nasal spray PLACE 1 SPRAY INTO BOTH NOSTRILS AT BEDTIME AS NEEDED   buPROPion (WELLBUTRIN XL) 150 MG 24 hr tablet Take 1 tablet (150 mg total) by mouth daily.   cephALEXin (KEFLEX) 500 MG capsule Take 1 capsule (500 mg total) by mouth 2 (two) times daily.   clonazePAM (KLONOPIN) 0.5 MG tablet Take 1 tablet (0.5 mg total) by mouth 2 (two) times daily as needed for anxiety.   lapatinib (TYKERB) 250 MG tablet Take by mouth.   levothyroxine (SYNTHROID) 75 MCG tablet TAKE  1 TABLET(75 MCG) BY MOUTH DAILY BEFORE AND BREAKFAST   loperamide (IMODIUM) 2 MG capsule Take by mouth as needed for diarrhea or loose stools.   loratadine (CLARITIN) 10 MG tablet Take 10 mg by mouth daily.   metoprolol succinate (TOPROL-XL) 25 MG 24 hr tablet TAKE 3 TABLETS(75 MG) BY MOUTH DAILY WITH OR IMMEDIATELY FOLLOWING A MEAL   Multiple Vitamin (MULTIVITAMIN) tablet Take 1 tablet by mouth daily.   mupirocin ointment (BACTROBAN) 2 % Apply 1 application topically 2 (two) times daily. Apply thin layer only.   ondansetron (ZOFRAN) 8 MG tablet Take 8 mg by mouth every 8 (eight) hours as needed.    prochlorperazine (COMPAZINE) 5 MG tablet    vitamin B-12 (CYANOCOBALAMIN) 100 MCG tablet Take 100 mcg by mouth daily.   [DISCONTINUED] OLANZapine (ZYPREXA) 5 MG tablet Take 5 mg by mouth at bedtime.   No facility-administered encounter medications on file as of 10/30/2021.    Allergies (verified) Amlodipine and Codeine   History: Past Medical History:  Diagnosis Date   Allergy    seasonal   Carpal tunnel syndrome    Degenerative joint disease    Depression    Endometriosis 1989   small amount   Glaucoma    left eye, Dr. Morrison Old in [place]   HSV infection    Hypertension    Osteoporosis    osteopenia, Fosamax then Boniva 2009   Vitamin D deficiency    on supplements   Past Surgical History:  Procedure Laterality  Date   APPENDECTOMY  1964   open   CARPAL TUNNEL RELEASE Right 01/2014   Dr. Malvin Johns   FOOT NEUROMA SURGERY  May 2016   Left foot   [place]  2012   Dr. Pryor Ochoa   THROAT SURGERY  05/02/2018   TONSILLECTOMY     TUBAL LIGATION  1992   Family History  Problem Relation Age of Onset   Osteoporosis Mother        secondary to steroids   Rheum arthritis Mother    Hypertension Mother    COPD Mother    Arthritis Mother    Stroke Mother    Cancer Father        prostate   Depression Son    Anxiety disorder Son    ADD / ADHD Son    ADD / ADHD  Son    Hypertrophic cardiomyopathy Sister    Schizophrenia Paternal Aunt    Heart disease Maternal Grandmother    COPD Maternal Grandfather    Depression Maternal Uncle    Social History   Socioeconomic History   Marital status: Married    Spouse name: Not on file   Number of children: Not on file   Years of education: Not on file   Highest education level: Not on file  Occupational History   Not on file  Tobacco Use   Smoking status: Former    Packs/day: 1.00    Years: 16.00    Pack years: 16.00    Types: Cigarettes    Quit date: 10/02/1983    Years since quitting: 38.1   Smokeless tobacco: Never  Vaping Use   Vaping Use: Never used  Substance and Sexual Activity   Alcohol use: Yes    Comment: occassionally   Drug use: No   Sexual activity: Yes  Other Topics Concern   Not on file  Social History Narrative   Not on file   Social Determinants of Health   Financial Resource Strain: Low Risk    Difficulty of Paying Living Expenses: Not hard at all  Food Insecurity: No Food Insecurity   Worried About Charity fundraiser in the Last Year: Never true   Ran Out of Food in the Last Year: Never true  Transportation Needs: No Transportation Needs   Lack of Transportation (Medical): No   Lack of Transportation (Non-Medical): No  Physical Activity: Not on file  Stress: No Stress Concern Present   Feeling of Stress : Not at all  Social Connections: Moderately Integrated   Frequency of Communication with Friends and Family: More than three times a week   Frequency of Social Gatherings with Friends and Family: Three times a week   Attends Religious Services: 1 to 4 times per year   Active Member of Clubs or Organizations: No   Attends Archivist Meetings: Never   Marital Status: Married    Tobacco Counseling Counseling given: Not Answered   Clinical Intake:  Pre-visit preparation completed: Yes        Diabetes: No  How often do you need to have  someone help you when you read instructions, pamphlets, or other written materials from your doctor or pharmacy?: 1 - Never  Interpreter Needed?: No    Activities of Daily Living In your present state of health, do you have any difficulty performing the following activities: 10/30/2021  Hearing? N  Vision? N  Difficulty concentrating or making decisions? N  Walking or  climbing stairs? N  Dressing or bathing? N  Doing errands, shopping? N  Preparing Food and eating ? Y  Comment Managed by husband. Self feeds.  Using the Toilet? N  In the past six months, have you accidently leaked urine? N  Do you have problems with loss of bowel control? N  Managing your Medications? N  Managing your Finances? Y  Comment Managed by husband  Housekeeping or managing your Housekeeping? Y  Comment Managed by husband  Some recent data might be hidden   Patient Care Team: Crecencio Mc, MD as PCP - General (Internal Medicine) Leona Singleton, RN as Case Manager  Indicate any recent Medical Services you may have received from other than [facility] providers in the past year (date may be approximate).     Assessment:   This is a routine wellness examination for Stacy Santana.  Virtual Visit via Telephone Note  I connected with  Stacy Santana on 10/30/21 at 11:15 AM EST by telephone and verified that I am speaking with the correct person using two identifiers.  Persons participating in the virtual visit: patient/Nurse Health Advisor   I discussed the limitations, risks, security and privacy concerns of performing an evaluation and management service by telephone and the availability of in person appointments. The patient expressed understanding and agreed to proceed.  Interactive audio and video telecommunications were attempted between this nurse and patient, however failed, due to patient having technical difficulties OR patient did not have access to video capability.  We continued and completed visit  with audio only.  Some vital signs may be absent or patient reported.   Hearing/Vision screen Hearing Screening - Comments:: Patient is able to hear conversational tones without difficulty. No issues reported. Vision Screening - Comments:: Wears corrective lenses Followed by Dr. Kathyrn Lass   Dietary issues and exercise activities discussed: Current Exercise Habits: Home exercise routine, Type of exercise: walking, Intensity: Mild Healthy diet Good water intake   Goals Addressed             This Visit's Progress    Maintain healthy lifestyle         Depression Screen PHQ 2/9 Scores 10/30/2021 10/18/2021 05/29/2021 12/14/2020 11/25/2020 10/27/2020 05/14/2020  PHQ - 2 Score - 0 0 0 0 0 1  PHQ- 9 Score - - - - - - 2  Exception Documentation Other- indicate reason in comment box - - - - Other- indicate reason in comment box -  Not completed Seen by [facility] Counselor every 2 months or more frequently as needed - - - - Followed by therapist/counselor every 1-2 months and as needed -    Fall Risk Fall Risk  10/30/2021 10/18/2021 05/29/2021 12/14/2020 11/25/2020  Falls in the past year? 0 0 0 1 0  Comment - - - - -  Number falls in past yr: 0 0 - 0 0  Comment - - - fall 12/13/20 -  Injury with Fall? - 0 - 0 0  Risk for fall due to : - - - History of fall(s);Impaired vision;Medication side effect -  Follow up Falls evaluation completed Falls evaluation completed Falls evaluation completed Falls evaluation completed;Education provided;Falls prevention discussed Falls evaluation completed    FALL RISK PREVENTION PERTAINING TO THE HOME: Home free of loose throw rugs in walkways, pet beds, electrical cords, etc? Yes  Adequate lighting in your home to reduce risk of falls? Yes   ASSISTIVE DEVICES UTILIZED TO PREVENT FALLS: Life alert? No  Use of  a cane, walker or w/c? No  Grab bars in the bathroom? Yes   TIMED UP AND GO: Was the test performed? No .   Cognitive Function: Patient is alert and  oriented x3.  MMSE - Mini Mental State Exam 06/21/2015  Orientation to time 5  Orientation to Place 5  Registration 3  Attention/ Calculation 5  Recall 3  Language- name 2 objects 2  Language- repeat 1  Language- follow 3 step command 3  Language- read & follow direction 1  Write a sentence 1  Copy design 1  Total score 30     6CIT Screen 10/16/2019  What Year? 0 points  What month? 0 points  What time? 0 points  Count back from 20 0 points  Months in reverse 0 points  Repeat phrase 0 points  Total Score 0   Immunizations Immunization History  Administered Date(s) Administered   Fluad Quad(high Dose 65+) 05/28/2019   Influenza Whole 06/18/2011, 06/16/2012   Influenza,inj,Quad PF,6+ Mos 07/21/2013, 06/21/2015   Influenza-Unspecified 07/20/2014, 05/15/2016, 07/01/2017, 06/30/2018, 05/28/2019, 07/01/2020, 06/20/2021   Moderna Sars-Covid-2 Vaccination 11/11/2019, 12/09/2019, 07/05/2020, 03/28/2021, 06/26/2021   Pneumococcal Conjugate-13 04/07/2014   Pneumococcal Polysaccharide-23 06/21/2015   Tdap 07/17/2009, 02/04/2020   Zoster, Live 03/18/2011    Screening Tests Health Maintenance  Topic Date Due   COVID-19 Vaccine (6 - Booster for Moderna series) 11/03/2021 (Originally 08/21/2021)   Zoster Vaccines- Shingrix (1 of 2) 01/28/2022 (Originally 01/17/1970)   TETANUS/TDAP  02/03/2030   Pneumonia Vaccine 46+ Years old  Completed   INFLUENZA VACCINE  Completed   DEXA SCAN  Completed   Hepatitis C Screening  Completed   HPV VACCINES  Aged Out   COLONOSCOPY (Pts 45-1yrs Insurance coverage will need to be confirmed)  Discontinued   Health Maintenance There are no preventive care reminders to display for this patient.  Vision Screening: Recommended annual ophthalmology exams for early detection of glaucoma and other disorders of the eye.  Dental Screening: Recommended annual dental exams for proper oral hygiene  Community Resource Referral / Chronic Care Management: CRR  required this visit?  No   CCM required this visit?  No      Plan:   Keep all routine maintenance appointments.   I have personally reviewed and noted the following in the patients chart:   Medical and social history Use of alcohol, tobacco or illicit drugs  Current medications and supplements including opioid prescriptions.  Functional ability and status Nutritional status Physical activity Advanced directives List of other physicians Hospitalizations, surgeries, and ER visits in previous 12 months Vitals Screenings to include cognitive, depression, and falls Referrals and appointments  In addition, I have reviewed and discussed with patient certain preventive protocols, quality metrics, and best practice recommendations. A written personalized care plan for preventive services as well as general preventive health recommendations were provided to patient.     OBrien-Blaney, Bekki Tavenner L, LPN   [id number]      I have reviewed the above information and agree with above.   Deborra Medina, MD

## 2021-10-30 NOTE — Patient Instructions (Addendum)
Stacy Santana , Thank you for taking time to come for your Medicare Wellness Visit. I appreciate your ongoing commitment to your health goals. Please review the following plan we discussed and let me know if I can assist you in the future.   These are the goals we discussed:  Goals      (RNCM) Manage Fatigue (Tiredness- Cancer Treatment)     Timeframe:  Long-Range Goal Priority:  Medium Start Date:   12/14/20                          Expected End Date:   09/29/21                    Follow Up Date 08/11/21    Eat healthy, take nausea medicine as prescribed Get a least 8 hours of sleep at night  Ask for help when needing it (don't be mad at yourself for needing assistance) Fall precautions and preventions, move slowly, sitting when feeling dizzy Allow yourself time to deal with your emotions Continue to work with Palliative Care Team when needed  Why is this important?   Cancer treatment and its side effects can drain your energy. It can keep you from doing things you would like to do.  There are many things that you can do to manage fatigue.    Notes:      (RNCM) Track and Manage My Blood Pressure-Hypertension     Timeframe:  Long-Range Goal Priority:  Medium Start Date:   12/14/20                          Expected End Date:  11/28/21                     Follow Up Date 08/11/21    Check blood pressure 3 times per week Write blood pressure results in a log for provider review Notify provider for sustained elevations in blood pressure Eat more vegetables in you diet  Why is this important?   You won't feel high blood pressure, but it can still hurt your blood vessels.  High blood pressure can cause heart or kidney problems. It can also cause a stroke.  Making lifestyle changes like losing a little weight or eating less salt will help.  Checking your blood pressure at home and at different times of the day can help to control blood pressure.  If the doctor prescribes medicine  remember to take it the way the doctor ordered.  Call the office if you cannot afford the medicine or if there are questions about it.     Notes:      Maintain healthy lifestyle        This is a list of the screening recommended for you and due dates:  Health Maintenance  Topic Date Due   COVID-19 Vaccine (6 - Booster for Moderna series) 11/03/2021*   Zoster (Shingles) Vaccine (1 of 2) 01/28/2022*   Tetanus Vaccine  02/03/2030   Pneumonia Vaccine  Completed   Flu Shot  Completed   DEXA scan (bone density measurement)  Completed   Hepatitis C Screening: USPSTF Recommendation to screen - Ages 83-79 yo.  Completed   HPV Vaccine  Aged Out   Colon Cancer Screening  Discontinued  *Topic was postponed. The date shown is not the original due date.    Advanced directives: End of life planning; Advance  aging; Advanced directives discussed.  Copy of current HCPOA/Living Will requested.    Conditions/risks identified: none new  Follow up in one year for your annual wellness visit    Preventive Care 65 Years and Older, Female Preventive care refers to lifestyle choices and visits with your health care provider that can promote health and wellness. What does preventive care include? A yearly physical exam. This is also called an annual well check. Dental exams once or twice a year. Routine eye exams. Ask your health care provider how often you should have your eyes checked. Personal lifestyle choices, including: Daily care of your teeth and gums. Regular physical activity. Eating a healthy diet. Avoiding tobacco and drug use. Limiting alcohol use. Practicing safe sex. Taking low-dose aspirin every day. Taking vitamin and mineral supplements as recommended by your health care provider. What happens during an annual well check? The services and screenings done by your health care provider during your annual well check will depend on your age, overall health, lifestyle risk factors, and  family history of disease. Counseling  Your health care provider may ask you questions about your: Alcohol use. Tobacco use. Drug use. Emotional well-being. Home and relationship well-being. Sexual activity. Eating habits. History of falls. Memory and ability to understand (cognition). Work and work Statistician. Reproductive health. Screening  You may have the following tests or measurements: Height, weight, and BMI. Blood pressure. Lipid and cholesterol levels. These may be checked every 5 years, or more frequently if you are over 34 years old. Skin check. Lung cancer screening. You may have this screening every year starting at age 82 if you have a 30-pack-year history of smoking and currently smoke or have quit within the past 15 years. Fecal occult blood test (FOBT) of the stool. You may have this test every year starting at age 81. Flexible sigmoidoscopy or colonoscopy. You may have a sigmoidoscopy every 5 years or a colonoscopy every 10 years starting at age 74. Hepatitis C blood test. Hepatitis B blood test. Sexually transmitted disease (STD) testing. Diabetes screening. This is done by checking your blood sugar (glucose) after you have not eaten for a while (fasting). You may have this done every 1-3 years. Bone density scan. This is done to screen for osteoporosis. You may have this done starting at age 29. Mammogram. This may be done every 1-2 years. Talk to your health care provider about how often you should have regular mammograms. Talk with your health care provider about your test results, treatment options, and if necessary, the need for more tests. Vaccines  Your health care provider may recommend certain vaccines, such as: Influenza vaccine. This is recommended every year. Tetanus, diphtheria, and acellular pertussis (Tdap, Td) vaccine. You may need a Td booster every 10 years. Zoster vaccine. You may need this after age 64. Pneumococcal 13-valent conjugate  (PCV13) vaccine. One dose is recommended after age 33. Pneumococcal polysaccharide (PPSV23) vaccine. One dose is recommended after age 76. Talk to your health care provider about which screenings and vaccines you need and how often you need them. This information is not intended to replace advice given to you by your health care provider. Make sure you discuss any questions you have with your health care provider. Document Released: 10/14/2015 Document Revised: 06/06/2016 Document Reviewed: 07/19/2015 Elsevier Interactive Patient Education  2017 [place] Prevention in the Home Falls can cause injuries. They can happen to people of all ages. There are many things you can do to  make your home safe and to help prevent falls. What can I do on the outside of my home? Regularly fix the edges of walkways and driveways and fix any cracks. Remove anything that might make you trip as you walk through a door, such as a raised step or threshold. Trim any bushes or trees on the path to your home. Use bright outdoor lighting. Clear any walking paths of anything that might make someone trip, such as rocks or tools. Regularly check to see if handrails are loose or broken. Make sure that both sides of any steps have handrails. Any raised decks and porches should have guardrails on the edges. Have any leaves, snow, or ice cleared regularly. Use sand or salt on walking paths during winter. Clean up any spills in your garage right away. This includes oil or grease spills. What can I do in the bathroom? Use night lights. Install grab bars by the toilet and in the tub and shower. Do not use towel bars as grab bars. Use non-skid mats or decals in the tub or shower. If you need to sit down in the shower, use a plastic, non-slip stool. Keep the floor dry. Clean up any water that spills on the floor as soon as it happens. Remove soap buildup in the tub or shower regularly. Attach bath mats securely with  double-sided non-slip rug tape. Do not have throw rugs and other things on the floor that can make you trip. What can I do in the bedroom? Use night lights. Make sure that you have a light by your bed that is easy to reach. Do not use any sheets or blankets that are too big for your bed. They should not hang down onto the floor. Have a firm chair that has side arms. You can use this for support while you get dressed. Do not have throw rugs and other things on the floor that can make you trip. What can I do in the kitchen? Clean up any spills right away. Avoid walking on wet floors. Keep items that you use a lot in easy-to-reach places. If you need to reach something above you, use a strong step stool that has a grab bar. Keep electrical cords out of the way. Do not use floor polish or wax that makes floors slippery. If you must use wax, use non-skid floor wax. Do not have throw rugs and other things on the floor that can make you trip. What can I do with my stairs? Do not leave any items on the stairs. Make sure that there are handrails on both sides of the stairs and use them. Fix handrails that are broken or loose. Make sure that handrails are as long as the stairways. Check any carpeting to make sure that it is firmly attached to the stairs. Fix any carpet that is loose or worn. Avoid having throw rugs at the top or bottom of the stairs. If you do have throw rugs, attach them to the floor with carpet tape. Make sure that you have a light switch at the top of the stairs and the bottom of the stairs. If you do not have them, ask someone to add them for you. What else can I do to help prevent falls? Wear shoes that: Do not have high heels. Have rubber bottoms. Are comfortable and fit you well. Are closed at the toe. Do not wear sandals. If you use a stepladder: Make sure that it is fully opened. Do not climb  a closed stepladder. Make sure that both sides of the stepladder are locked  into place. Ask someone to hold it for you, if possible. Clearly mark and make sure that you can see: Any grab bars or handrails. First and last steps. Where the edge of each step is. Use tools that help you move around (mobility aids) if they are needed. These include: Canes. Walkers. Scooters. Crutches. Turn on the lights when you go into a dark area. Replace any light bulbs as soon as they burn out. Set up your furniture so you have a clear path. Avoid moving your furniture around. If any of your floors are uneven, fix them. If there are any pets around you, be aware of where they are. Review your medicines with your doctor. Some medicines can make you feel dizzy. This can increase your chance of falling. Ask your doctor what other things that you can do to help prevent falls. This information is not intended to replace advice given to you by your health care provider. Make sure you discuss any questions you have with your health care provider. Document Released: 07/14/2009 Document Revised: 02/23/2016 Document Reviewed: 10/22/2014 Elsevier Interactive Patient Education  2017 [employer].

## 2021-11-01 ENCOUNTER — Institutional Professional Consult (permissible substitution): Admit: 2021-11-01 | Discharge: 2021-11-02 | Payer: MEDICARE

## 2021-11-10 DIAGNOSIS — C3492 Malignant neoplasm of unspecified part of left bronchus or lung: Principal | ICD-10-CM

## 2021-11-13 ENCOUNTER — Ambulatory Visit: Admit: 2021-11-13 | Discharge: 2021-11-14 | Payer: MEDICARE

## 2021-11-14 ENCOUNTER — Ambulatory Visit: Admit: 2021-11-14 | Discharge: 2021-11-15 | Payer: MEDICARE

## 2021-11-14 ENCOUNTER — Telehealth
Admit: 2021-11-14 | Discharge: 2021-11-15 | Payer: MEDICARE | Attending: Hematology & Oncology | Primary: Hematology & Oncology

## 2021-11-14 DIAGNOSIS — C3492 Malignant neoplasm of unspecified part of left bronchus or lung: Principal | ICD-10-CM

## 2021-11-18 ENCOUNTER — Ambulatory Visit: Admit: 2021-11-18 | Payer: MEDICARE

## 2021-11-18 ENCOUNTER — Ambulatory Visit: Admit: 2021-11-18 | Discharge: 2021-11-29 | Disposition: A | Discharge status: Hospice | Payer: MEDICARE

## 2021-11-20 DIAGNOSIS — B009 Herpesviral infection, unspecified: Principal | ICD-10-CM

## 2021-11-20 DIAGNOSIS — Z79899 Other long term (current) drug therapy: Principal | ICD-10-CM

## 2021-11-20 DIAGNOSIS — J122 Parainfluenza virus pneumonia: Principal | ICD-10-CM

## 2021-11-20 DIAGNOSIS — F341 Dysthymic disorder: Principal | ICD-10-CM

## 2021-11-20 DIAGNOSIS — Z0000 Encounter for general adult medical examination without abnormal findings: Principal | ICD-10-CM

## 2021-11-20 DIAGNOSIS — C3492 Malignant neoplasm of unspecified part of left bronchus or lung: Principal | ICD-10-CM

## 2021-11-28 ENCOUNTER — Ambulatory Visit: Payer: Medicare Other | Admitting: Internal Medicine

## 2021-11-28 DIAGNOSIS — C3492 Malignant neoplasm of unspecified part of left bronchus or lung: Principal | ICD-10-CM

## 2021-11-29 ENCOUNTER — Encounter: Admit: 2021-11-29 | Payer: MEDICARE

## 2021-11-29 ENCOUNTER — Encounter: Admit: 2021-11-29 | Discharge: 2021-12-29 | Payer: MEDICARE

## 2021-11-29 ENCOUNTER — Ambulatory Visit: Admit: 2021-11-29 | Discharge: 2021-12-29 | Payer: MEDICARE

## 2021-11-29 ENCOUNTER — Inpatient Hospital Stay: Admit: 2021-11-29 | Payer: MEDICARE

## 2021-11-29 ENCOUNTER — Ambulatory Visit
Admit: 2021-11-29 | Discharge: 2021-12-21 | Disposition: A | Discharge status: Hospice | Payer: MEDICARE | Source: Other Acute Inpatient Hospital | Admitting: Geriatric Medicine

## 2021-11-30 DIAGNOSIS — C3492 Malignant neoplasm of unspecified part of left bronchus or lung: Principal | ICD-10-CM

## 2021-12-04 DIAGNOSIS — C3492 Malignant neoplasm of unspecified part of left bronchus or lung: Principal | ICD-10-CM

## 2021-12-05 DIAGNOSIS — C3492 Malignant neoplasm of unspecified part of left bronchus or lung: Principal | ICD-10-CM

## 2021-12-06 DIAGNOSIS — C3492 Malignant neoplasm of unspecified part of left bronchus or lung: Principal | ICD-10-CM

## 2021-12-07 DIAGNOSIS — C3492 Malignant neoplasm of unspecified part of left bronchus or lung: Principal | ICD-10-CM

## 2021-12-08 DIAGNOSIS — C3492 Malignant neoplasm of unspecified part of left bronchus or lung: Principal | ICD-10-CM

## 2021-12-09 ENCOUNTER — Other Ambulatory Visit: Payer: Self-pay | Admitting: Internal Medicine

## 2021-12-14 DIAGNOSIS — C3492 Malignant neoplasm of unspecified part of left bronchus or lung: Principal | ICD-10-CM

## 2021-12-18 DIAGNOSIS — C3492 Malignant neoplasm of unspecified part of left bronchus or lung: Principal | ICD-10-CM

## 2021-12-19 DIAGNOSIS — C3492 Malignant neoplasm of unspecified part of left bronchus or lung: Principal | ICD-10-CM

## 2021-12-22 DIAGNOSIS — C3492 Malignant neoplasm of unspecified part of left bronchus or lung: Principal | ICD-10-CM

## 2021-12-23 DIAGNOSIS — C3492 Malignant neoplasm of unspecified part of left bronchus or lung: Principal | ICD-10-CM

## 2021-12-25 DIAGNOSIS — C3492 Malignant neoplasm of unspecified part of left bronchus or lung: Principal | ICD-10-CM

## 2021-12-28 DIAGNOSIS — C3492 Malignant neoplasm of unspecified part of left bronchus or lung: Principal | ICD-10-CM

## 2021-12-30 ENCOUNTER — Encounter: Admit: 2021-12-30 | Payer: MEDICARE

## 2021-12-30 ENCOUNTER — Encounter: Admit: 2021-12-30 | Discharge: 2022-01-14 | Payer: MEDICARE

## 2022-01-17 DIAGNOSIS — J9601 Acute respiratory failure with hypoxia: Principal | ICD-10-CM

## 2022-01-17 DIAGNOSIS — J309 Allergic rhinitis, unspecified: Principal | ICD-10-CM

## 2022-01-17 DIAGNOSIS — Z9181 History of falling: Principal | ICD-10-CM

## 2022-01-17 DIAGNOSIS — I10 Essential (primary) hypertension: Principal | ICD-10-CM

## 2022-01-17 DIAGNOSIS — A419 Sepsis, unspecified organism: Principal | ICD-10-CM

## 2022-01-17 DIAGNOSIS — J1108 Influenza due to unidentified influenza virus with specified pneumonia: Principal | ICD-10-CM

## 2022-01-17 DIAGNOSIS — F32A Depression, unspecified: Principal | ICD-10-CM

## 2022-01-17 DIAGNOSIS — C3482 Malignant neoplasm of overlapping sites of left bronchus and lung: Principal | ICD-10-CM

## 2022-01-17 DIAGNOSIS — T402X5D Adverse effect of other opioids, subsequent encounter: Principal | ICD-10-CM

## 2022-01-17 DIAGNOSIS — K5903 Drug induced constipation: Principal | ICD-10-CM

## 2022-01-17 DIAGNOSIS — J9602 Acute respiratory failure with hypercapnia: Principal | ICD-10-CM

## 2022-01-17 DIAGNOSIS — Z87891 Personal history of nicotine dependence: Principal | ICD-10-CM

## 2022-01-17 DIAGNOSIS — J122 Parainfluenza virus pneumonia: Principal | ICD-10-CM

## 2022-01-17 DIAGNOSIS — Z452 Encounter for adjustment and management of vascular access device: Principal | ICD-10-CM

## 2022-01-17 DIAGNOSIS — G4700 Insomnia, unspecified: Principal | ICD-10-CM

## 2022-01-17 DIAGNOSIS — C7801 Secondary malignant neoplasm of right lung: Principal | ICD-10-CM

## 2022-01-17 DIAGNOSIS — M1990 Unspecified osteoarthritis, unspecified site: Principal | ICD-10-CM

## 2022-01-17 DIAGNOSIS — F419 Anxiety disorder, unspecified: Principal | ICD-10-CM

## 2022-01-17 DIAGNOSIS — H409 Unspecified glaucoma: Principal | ICD-10-CM

## 2022-01-17 DIAGNOSIS — Z466 Encounter for fitting and adjustment of urinary device: Principal | ICD-10-CM

## 2022-01-29 DEATH — deceased

## 2022-04-05 ENCOUNTER — Ambulatory Visit: Payer: Self-pay | Admitting: *Deleted

## 2022-04-05 DIAGNOSIS — I10 Essential (primary) hypertension: Secondary | ICD-10-CM

## 2022-04-05 DIAGNOSIS — C3492 Malignant neoplasm of unspecified part of left bronchus or lung: Secondary | ICD-10-CM

## 2022-04-05 NOTE — Chronic Care Management (AMB) (Signed)
  Care Management   Follow Up Note   04/05/2022 Name: Stacy Santana MRN: [id number] DOB: 08-29-1951   Referred by: Crecencio Mc, MD Reason for referral : Case Closure   Patient was referred for care management engagement. It is noted in the EHR that the patient is deceased as of 2022/02/09 (date). The primary care provider has been notified of this information. No further outreach on the part of the embedded care management team will be made.   Follow Up Plan: No further follow up required: AS PATIENT IS EXPIRED.  Hubert Azure RN, MSN RN Care Management Coordinator [place] [phone] [email or web address]

## 2022-09-11 NOTE — Telephone Encounter (Signed)
Error

## 2022-09-12 NOTE — Telephone Encounter (Signed)
MyChart messgae sent to patient.
